# Patient Record
Sex: Female | Born: 1935
Health system: Southern US, Community
[De-identification: ages and names within clinical notes are randomized; demographics above are authoritative.]

## PROBLEM LIST (undated history)

## (undated) DIAGNOSIS — D509 Iron deficiency anemia, unspecified: Secondary | ICD-10-CM

## (undated) DIAGNOSIS — Z8701 Personal history of pneumonia (recurrent): Secondary | ICD-10-CM

## (undated) DIAGNOSIS — C189 Malignant neoplasm of colon, unspecified: Secondary | ICD-10-CM

## (undated) DIAGNOSIS — H548 Legal blindness, as defined in USA: Secondary | ICD-10-CM

## (undated) DIAGNOSIS — H3530 Unspecified macular degeneration: Secondary | ICD-10-CM

## (undated) DIAGNOSIS — I639 Cerebral infarction, unspecified: Secondary | ICD-10-CM

## (undated) DIAGNOSIS — I2510 Atherosclerotic heart disease of native coronary artery without angina pectoris: Secondary | ICD-10-CM

## (undated) DIAGNOSIS — I10 Essential (primary) hypertension: Secondary | ICD-10-CM

## (undated) DIAGNOSIS — E119 Type 2 diabetes mellitus without complications: Secondary | ICD-10-CM

## (undated) DIAGNOSIS — R4189 Other symptoms and signs involving cognitive functions and awareness: Secondary | ICD-10-CM

## (undated) DIAGNOSIS — E782 Mixed hyperlipidemia: Secondary | ICD-10-CM

## (undated) DIAGNOSIS — D649 Anemia, unspecified: Secondary | ICD-10-CM

## (undated) DIAGNOSIS — I480 Paroxysmal atrial fibrillation: Secondary | ICD-10-CM

## (undated) HISTORY — DX: Type 2 diabetes mellitus without complications: E11.9

## (undated) HISTORY — PX: TOTAL HIP ARTHROPLASTY: SHX124

## (undated) HISTORY — DX: Other symptoms and signs involving cognitive functions and awareness: R41.89

## (undated) HISTORY — DX: Paroxysmal atrial fibrillation: I48.0

## (undated) HISTORY — DX: Unspecified macular degeneration: H35.30

## (undated) HISTORY — PX: CATARACT EXTRACTION: SUR2

## (undated) HISTORY — DX: Mixed hyperlipidemia: E78.2

## (undated) HISTORY — PX: CHOLECYSTECTOMY: SHX55

## (undated) HISTORY — PX: APPENDECTOMY: SHX54

## (undated) HISTORY — DX: Personal history of pneumonia (recurrent): Z87.01

## (undated) HISTORY — PX: OTHER SURGICAL HISTORY: SHX169

## (undated) HISTORY — DX: Anemia, unspecified: D64.9

## (undated) HISTORY — DX: Malignant neoplasm of colon, unspecified: C18.9

## (undated) HISTORY — DX: Essential (primary) hypertension: I10

## (undated) HISTORY — DX: Iron deficiency anemia, unspecified: D50.9

## (undated) HISTORY — PX: TUBAL LIGATION: SHX77

---

## 2005-01-02 ENCOUNTER — Ambulatory Visit: Payer: Self-pay | Admitting: Internal Medicine

## 2005-01-02 ENCOUNTER — Ambulatory Visit (HOSPITAL_COMMUNITY): Admission: RE | Admit: 2005-01-02 | Discharge: 2005-01-02 | Payer: Self-pay | Admitting: Internal Medicine

## 2005-06-12 ENCOUNTER — Ambulatory Visit: Payer: Self-pay | Admitting: Oncology

## 2005-06-12 ENCOUNTER — Ambulatory Visit (HOSPITAL_COMMUNITY): Admission: RE | Admit: 2005-06-12 | Discharge: 2005-06-12 | Payer: Self-pay | Admitting: Oncology

## 2010-12-10 ENCOUNTER — Inpatient Hospital Stay (HOSPITAL_COMMUNITY): Payer: Medicare Other

## 2010-12-10 ENCOUNTER — Observation Stay (HOSPITAL_COMMUNITY)
Admission: RE | Admit: 2010-12-10 | Discharge: 2010-12-11 | Disposition: A | Discharge status: Home / Self Care | Payer: Medicare Other | Source: Ambulatory Visit | Attending: Orthopedic Surgery | Admitting: Orthopedic Surgery

## 2010-12-10 DIAGNOSIS — I10 Essential (primary) hypertension: Secondary | ICD-10-CM | POA: Insufficient documentation

## 2010-12-10 DIAGNOSIS — S82009A Unspecified fracture of unspecified patella, initial encounter for closed fracture: Principal | ICD-10-CM | POA: Insufficient documentation

## 2010-12-10 DIAGNOSIS — W19XXXA Unspecified fall, initial encounter: Secondary | ICD-10-CM | POA: Insufficient documentation

## 2010-12-10 DIAGNOSIS — E119 Type 2 diabetes mellitus without complications: Secondary | ICD-10-CM | POA: Insufficient documentation

## 2010-12-10 DIAGNOSIS — Y92009 Unspecified place in unspecified non-institutional (private) residence as the place of occurrence of the external cause: Secondary | ICD-10-CM | POA: Insufficient documentation

## 2010-12-10 LAB — BASIC METABOLIC PANEL
BUN: 24 mg/dL — ABNORMAL HIGH (ref 6–23)
Calcium: 9.5 mg/dL (ref 8.4–10.5)
GFR calc non Af Amer: 48 mL/min — ABNORMAL LOW (ref >60)
Glucose, Bld: 104 mg/dL — ABNORMAL HIGH (ref 70–99)
Potassium: 4.7 mEq/L (ref 3.5–5.1)

## 2010-12-10 LAB — GLUCOSE, CAPILLARY: Glucose-Capillary: 145 mg/dL — ABNORMAL HIGH (ref 70–99)

## 2010-12-10 LAB — CBC
MCHC: 32.9 g/dL (ref 30.0–36.0)
MCV: 86 fL (ref 78.0–100.0)
Platelets: 257 10*3/uL (ref 150–400)
RDW: 13.6 % (ref 11.5–15.5)
WBC: 7.2 10*3/uL (ref 4.0–10.5)

## 2010-12-10 LAB — SURGICAL PCR SCREEN: Staphylococcus aureus: NEGATIVE

## 2010-12-11 LAB — GLUCOSE, CAPILLARY: Glucose-Capillary: 105 mg/dL — ABNORMAL HIGH (ref 70–99)

## 2010-12-12 LAB — GLUCOSE, CAPILLARY: Glucose-Capillary: 92 mg/dL (ref 70–99)

## 2011-01-03 NOTE — Op Note (Signed)
Laura Parks, Laura Parks                ACCOUNT NO.:  [id number]  MEDICAL RECORD NO.:  [id number]           PATIENT TYPE:  I  LOCATION:  [id number]                         FACILITY:  [facility]  PHYSICIAN:  Wylene Simmer, MD        DATE OF BIRTH:  May 10, 1930  DATE OF PROCEDURE:  12/10/2010 DATE OF DISCHARGE:                              OPERATIVE REPORT   PREOPERATIVE DIAGNOSIS:  Right patella fracture.  POSTOPERATIVE DIAGNOSIS:  Right patella fracture.  PROCEDURES: 1. Open reduction internal fixation of right patella fracture. 2. Intraoperative interpretation of fluoroscopic images.  SURGEON:  Wylene Simmer, MD  ANESTHESIA:  General, regional.  IV FLUIDS:  See anesthesia record.  ESTIMATED BLOOD LOSS:  Minimal.  TOURNIQUET TIME:  Fifty-nine minutes at 250 mmHg.  COMPLICATIONS:  None apparent.  DISPOSITION:  Extubated, awake, and stable to recovery.  INDICATIONS FOR PROCEDURE:  The patient is a 77 year old female who has a history of a right patella fracture status post ORIF several years ago.  She recently fell on her right patella.  She was found on x-rays and a CT scan to have a displaced widely separated fracture.  She presents now for operative treatment of this intra-articular injury. She understands the risks and benefits of this procedure as well as the alternative treatment options and would like to proceed.  Specifically, she understands risks of bleeding, infection, nerve damage, blood clots, need for additional surgery, amputation, and death.  PROCEDURE IN DETAIL:  After preoperative consent was obtained and the correct operative site was identified, the patient was brought to the operating room and placed supine on the operating table.  General anesthesia was induced.  Regional anesthesia previously had been administered.  A surgical time-out was taken.  Preoperative antibiotics were administered.  The right lower extremity was prepped and draped in standard sterile  fashion with tourniquet around proximal thigh.  The patient's previous anterior knee incision was identified.  The extremity was exsanguinated and the tourniquet was inflated to 250 mmHg. Previously marked incision was made and sharp dissection was carried down through the level of the skin to the level of the extensor retinaculum.  The extensor retinaculum was incised in line with its fibers elevated off of the patellar tendon.  The transverse fracture line was identified and was noted to be displaced with some midline comminution.  The fracture line was opened and irrigated.  A large hematoma was aspirated from the knee joint.  The fracture was reduced. A 1.6-mm guidewire from the DePuy 4-mm cannulated screw set was then inserted through the inferior hole and advanced into the proximal aspect of the patella.  A second wire was placed in parallel capturing the inferolateral fragment.  The knee was examined under AP and lateral fluoroscopic imaging.  The articular surface was noted to be appropriately reduced.  The guidepin was measured, a 4-mm partially threaded cannulated screw was then inserted over the guidewire and advanced until compression was noted at the fracture line.  This was repeated for the lateral wire.  AP and lateral views were again obtained showing appropriate position and length of the hardware  as well as appropriate reduction of the fracture lines.  A #5 FiberWire was then advanced through the cannulated hole of one of the screws.  It was brought back over the anterior aspect of the patella and then advanced through the other screw.  The two remaining limbs were then tied over the anterior aspect of the patella creating a figure-of-eight pattern tension band.  The suture ends were trimmed and the knot was buried beneath the other limb of suture.  Final AP and lateral views showed appropriate position and length of all hardware and appropriate reduction of the  fracture.  The patient's knee could be flexed to 105 degrees without gapping of the fracture site.  The wound was irrigated copiously.  The extensor retinaculum was closed with simple sutures of 3- 0 Monocryl.  The skin was closed with a running subcuticular 3-0 Monocryl suture.  Sterile dressings were applied followed by a range of motion brace locked in 0 degrees.  The patient was then awakened from anesthesia and transported to recovery room in stable condition.  The tourniquet had been released 59 minutes after application of the dressings and compression wrap.  The patient was then awakened from anesthesia and transported to recovery room in stable condition.  FOLLOWUP PLAN:  The patient will be weightbearing as tolerated on her right lower extremity in her brace.  She will follow up with me in 2 weeks.  She will be observed overnight for pain control.     Wylene Simmer, MD     JH/MEDQ  D:  12/10/2010  T:  12/11/2010  Job:  [id number]  Electronically Signed by Wylene Simmer  on 01/03/2011 10:27:47 PM

## 2012-02-23 ENCOUNTER — Telehealth (INDEPENDENT_AMBULATORY_CARE_PROVIDER_SITE_OTHER): Payer: Self-pay | Admitting: *Deleted

## 2012-02-23 ENCOUNTER — Ambulatory Visit (INDEPENDENT_AMBULATORY_CARE_PROVIDER_SITE_OTHER): Payer: Medicare Other | Admitting: Internal Medicine

## 2012-02-23 ENCOUNTER — Encounter (INDEPENDENT_AMBULATORY_CARE_PROVIDER_SITE_OTHER): Payer: Self-pay | Admitting: Internal Medicine

## 2012-02-23 ENCOUNTER — Other Ambulatory Visit (INDEPENDENT_AMBULATORY_CARE_PROVIDER_SITE_OTHER): Payer: Self-pay | Admitting: *Deleted

## 2012-02-23 VITALS — BP 100/62 | HR 60 | Temp 98.4°F | Ht 62.0 in | Wt 154.5 lb

## 2012-02-23 DIAGNOSIS — C189 Malignant neoplasm of colon, unspecified: Secondary | ICD-10-CM

## 2012-02-23 DIAGNOSIS — E119 Type 2 diabetes mellitus without complications: Secondary | ICD-10-CM

## 2012-02-23 DIAGNOSIS — I10 Essential (primary) hypertension: Secondary | ICD-10-CM

## 2012-02-23 DIAGNOSIS — R197 Diarrhea, unspecified: Secondary | ICD-10-CM

## 2012-02-23 DIAGNOSIS — E7800 Pure hypercholesterolemia, unspecified: Secondary | ICD-10-CM

## 2012-02-23 DIAGNOSIS — E782 Mixed hyperlipidemia: Secondary | ICD-10-CM | POA: Insufficient documentation

## 2012-02-23 DIAGNOSIS — E139 Other specified diabetes mellitus without complications: Secondary | ICD-10-CM | POA: Insufficient documentation

## 2012-02-23 DIAGNOSIS — D649 Anemia, unspecified: Secondary | ICD-10-CM

## 2012-02-23 NOTE — Progress Notes (Signed)
Subjective:     Patient ID: Laura Parks, female   DOB: [birth year], 78 y.o.   MRN: [id number]  HPI Laura Parks is a 78 yr old female with c/o of diarrhea. She has had chronic diarrhea all her life.  Symptoms worse in the last 6 months. Sometimes she is incontinent.  Sometimes she feels a quiver rt side of abdomen and then a short while later she will have diarrhea. She is having over 10 stools a day when she is having diarrhea. The diarrhea is on a daily basis. She has been taking Lomotil given to her by Dr. Woody Seller which helps. With the Lomotil she would have 5 stools a day.  Appetite is good.. Intentional weight loss.  She will have lower abdominal pain and nausea with her BMs. No melena or bright red rectal bleeding. Hx of microcystic anemia and underwent a colonoscopy in 2009 for same (plus diarrhea): see below.   Two weeks ago she was on antibiotics for a tick bite.   Colonoscopy 11/09 Dr. Laural Golden: Normal . Exam performed to ileocolonic anastomosis in region of hepatic flexure which was normal. No evidence of polyps and or tumor. Few scattered diverticula at sigmoid. Hx hemicolectomy. Review of Systems see hpi      Current Outpatient Prescriptions  Medication Sig Dispense Refill  . alendronate (FOSAMAX) 70 MG tablet Take 70 mg by mouth every 7 (seven) days. Take with a full glass of water on an empty stomach.      Marland Kitchen atorvastatin (LIPITOR) 10 MG tablet Take 10 mg by mouth daily.      . calcium carbonate (OS-CAL) 600 MG TABS Take 600 mg by mouth daily.      . diphenoxylate-atropine (LOMOTIL) 2.5-0.025 MG per tablet Take 1 tablet by mouth 4 (four) times daily as needed.      . ergocalciferol (VITAMIN D2) 50000 UNITS capsule Take 50,000 Units by mouth once a week.      . ferrous gluconate (FERGON) 325 MG tablet Take 325 mg by mouth daily with breakfast.      . lisinopril (PRINIVIL,ZESTRIL) 40 MG tablet Take 40 mg by mouth daily.      . metFORMIN (GLUCOPHAGE) 1000 MG tablet Take 1,000 mg by mouth 2  (two) times daily with a meal.      . metoprolol (LOPRESSOR) 50 MG tablet Take 50 mg by mouth 2 (two) times daily.      . Multiple Vitamin (MULTIVITAMIN) tablet Take 1 tablet by mouth daily.       Past Medical History  Diagnosis Date  . Diabetes mellitus     type 2 x 20 yrs  . Colon cancer   . Anemia   . Hypertension   . High cholesterol    Past Surgical History  Procedure Date  . Cataract extraction   . Cholecystectomy   . Appendectomy   . Rt knee surgery for a fx   . Colon surgery for colon cancer     1992 by Dr Mendel Ryder  . Tubal ligation    Family Status  Relation Status Death Age  . Mother Deceased     copd  . Father Alive     health unknown age 53  . Brother Alive     all in pretty good health   History   Social History  . Marital Status: Married    Spouse Name: N/A    Number of Children: N/A  . Years of Education: N/A   Occupational History  .  Not on file.   Social History Main Topics  . Smoking status: Never Smoker   . Smokeless tobacco: Not on file  . Alcohol Use: No  . Drug Use: No  . Sexually Active: Not on file   Other Topics Concern  . Not on file   Social History Narrative  . No narrative on file   Allergies  Allergen Reactions  . Codeine     Objective:   Physical Exam  Filed Vitals:   02/23/12 1426  Height: 5\' 2"  (1.575 m)  Weight: 154 lb 8 oz (70.081 kg)   Alert and oriented. Skin warm and dry. Oral mucosa is moist.   . Sclera anicteric, conjunctivae is pink. Thyroid not enlarged. No cervical lymphadenopathy. Lungs clear. Heart regular rate and rhythm.  Abdomen is soft. Bowel sounds are positive. No hepatomegaly. No abdominal masses felt. No tenderness. Stool brown and guaiac negative.  No edema to lower extremities.       Assessment:   Diarrhea which has worsended in the past 6 months. Personal hx of colon cancer. Infectious process needs to be ruled out.  Colon cancer is also in the differential.  Anemia: felt to be chronic.     Plan:    Stool studies. CBC, Colonoscopy. Increase fiber (fiber tabs). Imodium BID   Will try to get any labs she has from [facility].

## 2012-02-23 NOTE — Telephone Encounter (Signed)
Patient needed correct C-Diff ordered

## 2012-02-23 NOTE — Patient Instructions (Addendum)
Imodium BID on schedule. Fiber 4 gms daily.  Colonoscopy with Dr Laural Golden. CBC and stool studies.

## 2012-02-23 NOTE — Telephone Encounter (Signed)
Patient needs movi prep 

## 2012-02-24 LAB — CBC WITH DIFFERENTIAL/PLATELET
Basophils Absolute: 0 10*3/uL (ref 0.0–0.1)
Basophils Relative: 0 % (ref 0–1)
Lymphocytes Relative: 44 % (ref 12–46)
MCHC: 31.7 g/dL (ref 30.0–36.0)
Monocytes Absolute: 0.5 10*3/uL (ref 0.1–1.0)
Neutro Abs: 3 10*3/uL (ref 1.7–7.7)
Neutrophils Relative %: 47 % (ref 43–77)
Platelets: 156 10*3/uL (ref 150–400)
RDW: 14.5 % (ref 11.5–15.5)
WBC: 6.4 10*3/uL (ref 4.0–10.5)

## 2012-02-24 MED ORDER — PEG-KCL-NACL-NASULF-NA ASC-C 100 G PO SOLR: 1.0000 | Freq: Once | ORAL | Status: DC

## 2012-02-28 LAB — FECAL LACTOFERRIN, QUANT: Lactoferrin: NEGATIVE

## 2012-03-02 LAB — STOOL CULTURE

## 2012-03-09 ENCOUNTER — Encounter (HOSPITAL_COMMUNITY): Payer: Self-pay | Admitting: Pharmacy Technician

## 2012-03-24 ENCOUNTER — Encounter (HOSPITAL_COMMUNITY)
Admission: RE | Disposition: A | Discharge status: Home / Self Care | Payer: Self-pay | Source: Ambulatory Visit | Attending: Internal Medicine

## 2012-03-24 ENCOUNTER — Ambulatory Visit (HOSPITAL_COMMUNITY)
Admission: RE | Admit: 2012-03-24 | Discharge: 2012-03-24 | Disposition: A | Discharge status: Home / Self Care | Payer: Medicare Other | Source: Ambulatory Visit | Attending: Internal Medicine | Admitting: Internal Medicine

## 2012-03-24 ENCOUNTER — Encounter (HOSPITAL_COMMUNITY): Payer: Self-pay | Admitting: *Deleted

## 2012-03-24 DIAGNOSIS — Z85038 Personal history of other malignant neoplasm of large intestine: Secondary | ICD-10-CM | POA: Insufficient documentation

## 2012-03-24 DIAGNOSIS — I10 Essential (primary) hypertension: Secondary | ICD-10-CM | POA: Insufficient documentation

## 2012-03-24 DIAGNOSIS — R197 Diarrhea, unspecified: Secondary | ICD-10-CM | POA: Insufficient documentation

## 2012-03-24 DIAGNOSIS — K5730 Diverticulosis of large intestine without perforation or abscess without bleeding: Secondary | ICD-10-CM

## 2012-03-24 DIAGNOSIS — K633 Ulcer of intestine: Secondary | ICD-10-CM | POA: Insufficient documentation

## 2012-03-24 DIAGNOSIS — Z01812 Encounter for preprocedural laboratory examination: Secondary | ICD-10-CM | POA: Insufficient documentation

## 2012-03-24 DIAGNOSIS — C189 Malignant neoplasm of colon, unspecified: Secondary | ICD-10-CM

## 2012-03-24 DIAGNOSIS — E119 Type 2 diabetes mellitus without complications: Secondary | ICD-10-CM | POA: Insufficient documentation

## 2012-03-24 DIAGNOSIS — E7800 Pure hypercholesterolemia, unspecified: Secondary | ICD-10-CM | POA: Insufficient documentation

## 2012-03-24 HISTORY — PX: COLONOSCOPY: SHX5424

## 2012-03-24 LAB — GLUCOSE, CAPILLARY: Glucose-Capillary: 115 mg/dL — ABNORMAL HIGH (ref 70–99)

## 2012-03-24 SURGERY — COLONOSCOPY
Anesthesia: Moderate Sedation

## 2012-03-24 MED ORDER — MIDAZOLAM HCL 5 MG/5ML IJ SOLN
INTRAMUSCULAR | Status: AC
  Filled 2012-03-24: qty 10

## 2012-03-24 MED ORDER — MIDAZOLAM HCL 5 MG/5ML IJ SOLN
INTRAMUSCULAR | Status: DC | PRN
  Administered 2012-03-24 (×2): 1 mg via INTRAVENOUS
  Administered 2012-03-24: 2 mg via INTRAVENOUS

## 2012-03-24 MED ORDER — STERILE WATER FOR IRRIGATION IR SOLN
Status: DC | PRN
  Administered 2012-03-24: 12:00:00

## 2012-03-24 MED ORDER — MEPERIDINE HCL 50 MG/ML IJ SOLN
INTRAMUSCULAR | Status: AC
  Filled 2012-03-24: qty 1

## 2012-03-24 MED ORDER — SODIUM CHLORIDE 0.45 % IV SOLN
Freq: Once | INTRAVENOUS | Status: AC
  Administered 2012-03-24: 11:00:00 via INTRAVENOUS

## 2012-03-24 MED ORDER — DEXTROSE IN LACTATED RINGERS 5 % IV SOLN
INTRAVENOUS | Status: DC
  Administered 2012-03-24: 11:00:00 via INTRAVENOUS

## 2012-03-24 MED ORDER — MEPERIDINE HCL 50 MG/ML IJ SOLN
INTRAMUSCULAR | Status: DC | PRN
  Administered 2012-03-24 (×2): 25 mg via INTRAVENOUS

## 2012-03-24 NOTE — OR Nursing (Signed)
Patient took Metformin and Actos this am at 1000. Blood sugar 76. Dr. Laural Golden notified and orders received.

## 2012-03-24 NOTE — Op Note (Signed)
COLONOSCOPY PROCEDURE REPORT  PATIENT:  Laura Parks  MR#:  [id number] Birthdate:  March 20, 1930, 78 y.o., female Endoscopist:  Dr. Rogene Houston, MD Referred By:  Dr. Glenda Chroman, MD Procedure Date: 03/24/2012  Procedure:   Colonoscopy  Indications:  Patient is 78 year old Caucasian female with history of colonic carcinoma with right, colectomy and 1992 was last colonoscopy was in November 2009 now presents for 6 month history of diarrhea. She is undergoing diagnostic/surveillance colonoscopy.  Informed Consent:  The procedure and risks were reviewed with the patient and informed consent was obtained.  Medications:  Demerol 50 mg IV Versed 4 mg IV  Description of procedure:  After a digital rectal exam was performed, that colonoscope was advanced from the anus through the rectum and colon to the area of the cecum, ileocecal valve and appendiceal orifice. The cecum was deeply intubated. These structures were well-seen and photographed for the record. From the level of the cecum and ileocecal valve, the scope was slowly and cautiously withdrawn. The mucosal surfaces were carefully surveyed utilizing scope tip to flexion to facilitate fold flattening as needed. The scope was pulled down into the rectum where a thorough exam including retroflexion was performed.  Findings:   Prep excellent. Patent ileocolonic anastomosis located in the vicinity of hepatic flexure. Short segment of neoterminal ileum was examined and was normal. 5 x 10 mm ulcer at ileocolonic anastomosis. Biopsy taken for routine histology. Scattered diverticula throughout the colon. Normal mucosa throughout. Random biopsies taken from sigmoid colon. Normal rectal mucosa. Skin tags.  Therapeutic/Diagnostic Maneuvers Performed:  See above  Complications:  None  Colon  Withdrawal Time:  10 minutes  Impression:  Normal distal small bowel. Single benign appearing ulcer at ileocolonic anastomosis. It was biopsied for  histology. Scattered diverticula throughout the colon. No endoscopic evidence of colitis. Biopsy taken to rule out microscopic and or collagenous colitis.  Recommendations:  Standard instructions given. Patient advised to take Imodium OTC 2 mg by mouth before breakfast and before lunch. I will contact patient with results of biopsy and further recommendations.  REHMAN,NAJEEB U  03/24/2012 12:12 PM  CC: Dr. Glenda Chroman., MD & Dr. Rayne Du ref. provider found

## 2012-03-24 NOTE — H&P (Signed)
Laura Parks is an 78 y.o. female.   Chief Complaint: Patient is here for colonoscopy. HPI: Patient is 78 year old Caucasian female who has had diarrhea off and on for years and was doing well until 6 months ago she had multiple bowel movements since then. She has some better with Imodium OTC. He also has history of colon carcinoma with right hemicolectomy 1992. She is here for diagnostic and surveillance colonoscopy. Patient's last exam was in 2009. She denies anorexia weight loss or rectal bleeding. Family history is negative for colorectal carcinoma.  Past Medical History  Diagnosis Date  . Diabetes mellitus     type 2 x 20 yrs  . Colon cancer   . Anemia   . Hypertension   . High cholesterol   . Macular degeneration     Past Surgical History  Procedure Date  . Cataract extraction   . Cholecystectomy   . Appendectomy   . Rt knee surgery for a fx   . Colon surgery for colon cancer     1992 by Dr Laura Parks  . Tubal ligation     History reviewed. No pertinent family history. Social History:  reports that she has never smoked. She does not have Laura smokeless tobacco history on file. She reports that she does not drink alcohol or use illicit drugs.  Allergies:  Allergies  Allergen Reactions  . Codeine Nausea Only    Medications Prior to Admission  Medication Sig Dispense Refill  . atorvastatin (LIPITOR) 10 MG tablet Take 10 mg by mouth daily.      . calcium carbonate (OS-CAL) 600 MG TABS Take 600 mg by mouth daily.      . ferrous sulfate 325 (65 FE) MG tablet Take 325 mg by mouth 2 (two) times daily.      Marland Kitchen lisinopril (PRINIVIL,ZESTRIL) 40 MG tablet Take 40 mg by mouth daily.      Marland Kitchen loperamide (IMODIUM) 2 MG capsule Take 2 mg by mouth 2 (two) times daily as needed. For diarrhea      . metFORMIN (GLUCOPHAGE) 500 MG tablet Take 1,000 mg by mouth daily.      . metoprolol succinate (TOPROL-XL) 50 MG 24 hr tablet Take 50 mg by mouth daily. Take with or immediately following a meal.       . Multiple Vitamin (MULTIVITAMIN WITH MINERALS) TABS Take 1 tablet by mouth daily.      . peg 3350 powder (MOVIPREP) 100 G SOLR Take 1 kit (100 g total) by mouth once.  1 kit  0  . pioglitazone (ACTOS) 30 MG tablet Take 30 mg by mouth daily.      . polycarbophil (FIBERCON) 625 MG tablet Take 1,250 mg by mouth 2 (two) times daily.      Marland Kitchen alendronate (FOSAMAX) 70 MG tablet Take 70 mg by mouth every 7 (seven) days. Take with a full glass of water on an empty stomach.      . ergocalciferol (VITAMIN D2) 50000 UNITS capsule Take 50,000 Units by mouth once a week. On Sundays        Results for orders placed during the hospital encounter of 03/24/12 (from the past 48 hour(s))  GLUCOSE, CAPILLARY     Status: Normal   Collection Time   03/24/12 11:21 AM      Component Value Range Comment   Glucose-Capillary 76  70 - 99 mg/dL    Comment 1 Documented in Chart      No results found.  ROS  Blood  pressure 184/80, temperature 98.1 F (36.7 C), temperature source Oral, resp. rate 23, height 5\' 2"  (1.575 m), weight 158 lb (71.668 kg), SpO2 98.00%. Physical Exam  Constitutional: She appears well-developed and well-nourished.  HENT:  Mouth/Throat: Oropharynx is clear and moist.  Eyes: Conjunctivae are normal. No scleral icterus.  Neck: No thyromegaly present.  Cardiovascular: Normal rate, regular rhythm and normal heart sounds.   No murmur heard. Respiratory: Effort normal and breath sounds normal.  GI: Soft. She exhibits no distension and no mass. There is no tenderness.  Musculoskeletal: She exhibits no edema.  Lymphadenopathy:    She has no cervical adenopathy.  Neurological: She is alert.  Skin: Skin is warm and dry.     Assessment/Plan Diarrhea. History of colon carcinoma. Diagnostic/surveillance colonoscopy  Laura Parks U 03/24/2012, 11:36 AM

## 2012-03-29 ENCOUNTER — Encounter (HOSPITAL_COMMUNITY): Payer: Self-pay | Admitting: Internal Medicine

## 2012-03-30 ENCOUNTER — Encounter (INDEPENDENT_AMBULATORY_CARE_PROVIDER_SITE_OTHER): Payer: Self-pay | Admitting: *Deleted

## 2012-04-22 ENCOUNTER — Ambulatory Visit (INDEPENDENT_AMBULATORY_CARE_PROVIDER_SITE_OTHER): Payer: Medicare Other | Admitting: Otolaryngology

## 2012-04-22 DIAGNOSIS — R51 Headache: Secondary | ICD-10-CM

## 2012-04-22 DIAGNOSIS — J310 Chronic rhinitis: Secondary | ICD-10-CM

## 2012-05-21 ENCOUNTER — Encounter (INDEPENDENT_AMBULATORY_CARE_PROVIDER_SITE_OTHER): Payer: Self-pay

## 2012-07-05 ENCOUNTER — Encounter (INDEPENDENT_AMBULATORY_CARE_PROVIDER_SITE_OTHER): Payer: Self-pay

## 2012-07-19 ENCOUNTER — Encounter (INDEPENDENT_AMBULATORY_CARE_PROVIDER_SITE_OTHER): Payer: Self-pay | Admitting: Internal Medicine

## 2012-07-19 ENCOUNTER — Encounter (INDEPENDENT_AMBULATORY_CARE_PROVIDER_SITE_OTHER): Payer: Self-pay | Admitting: *Deleted

## 2012-07-19 ENCOUNTER — Other Ambulatory Visit (INDEPENDENT_AMBULATORY_CARE_PROVIDER_SITE_OTHER): Payer: Self-pay | Admitting: *Deleted

## 2012-07-19 ENCOUNTER — Ambulatory Visit (INDEPENDENT_AMBULATORY_CARE_PROVIDER_SITE_OTHER): Payer: Medicare Other | Admitting: Internal Medicine

## 2012-07-19 VITALS — BP 140/70 | HR 78 | Temp 97.5°F | Resp 20 | Ht 62.0 in | Wt 155.6 lb

## 2012-07-19 DIAGNOSIS — D509 Iron deficiency anemia, unspecified: Secondary | ICD-10-CM

## 2012-07-19 NOTE — Consult Note (Signed)
Laura Parks, Laura Parks                ACCOUNT NO.:  [id number]  MEDICAL RECORD NO.:  [id number]  LOCATION:  [facility]                          FACILITY:  [facility]  PHYSICIAN:  Hildred Laser, M.D.    DATE OF BIRTH:  02-Feb-1930  DATE OF CONSULTATION:  07/19/2012 DATE OF DISCHARGE:  03/24/2012                                CONSULTATION   REASON FOR CONSULTATION:  Iron-deficiency anemia.  HISTORY OF PRESENT ILLNESS:  The patient is an 78 year old Caucasian female, who is referred through courtesy of Dr. Woody Seller for evaluation of iron deficiency anemia.  The patient was admitted to [facility] about 5 weeks ago with pneumonia and during that hospitalization, she was noted to be anemic.  Her hemoglobin was as low as 8.5 g.  Her platelet count was also low between 91 and 105.  She had iron studies.  Serum iron was 19, TIBC 263, saturation was 7%.  She was, therefore, advised to undergo GI evaluation.  The patient denies melena or rectal bleeding.  She also denies vaginal bleeding or hematuria.  She rarely experiences heartburn.  She has occasional lower abdominal pain, described as cramping.  She states she has a good appetite, but she has lost 20 pounds this year voluntarily.  She states she has cut back on sweets and breads.  The patient has history of colon carcinoma and underwent surveillance colonoscopy on March 24, 2012, revealing patent anastomosis with single ulcer and biopsy revealed benign etiology.  She also has scattered diverticula throughout the colon.  Biopsy from her sigmoid colon was negative for microscopic or collagenous colitis.  The patient states since she has been on Imodium, she is having normal bowel movements.  She generally has 1 stool per day.  CURRENT MEDICATIONS: 1. Alendronate 70 mg p.o. every weekly. 2. Atorvastatin 10 mg p.o. daily. 3. Calcium carbonate 600 mg p.o. daily. 4. Vitamin D2 50,000 units p.o. every weekly. 5. Ferrous sulfate 325 mg p.o.  daily. 6. Lactobacillus 1 capsule p.o. b.i.d. 7. Lisinopril 40 mg p.o. daily. 8. Loperamide 2 mg p.o. b.i.d. p.r.n. 9. Metformin 1 g p.o. b.i.d. 10.Metoprolol succinate 50 mg p.o. daily. 11.MVI with minerals 1 tablet daily. 12.Pioglitazone 30 mg p.o. q.a.m. 13.FiberCon 2 tablets p.o. b.i.d.  PAST MEDICAL HISTORY:  She has been diabetic for over 20 years.  She has had hypertension for about 10 years.  Hyperlipidemia also about 10 years.  She has osteoporosis.  History of colon carcinoma.  She had right hemicolectomy in 1992, and she has undergone serial surveillance colonoscopies most recently in July 2013, as above.  She has macular degeneration diagnosed last year. She is legally blind.  She is able to read large print and able to watch TV with special goggles.  She had bilateral cataract surgery over 10 years ago and she had cholecystectomy in 2012.  She has had surgery for right patellar fracture 7 years ago, and she fell again and refractured and had surgery in March 2012.  ALLERGIES:  CODEINE.  FAMILY HISTORY:  Mother lived to be 22, she had COPD.  The patient has no information about her father.  She has 4 brothers in good health.  SOCIAL HISTORY:  She is married.  Her husband is 3 years old, is recovering from colonic surgery for large polyp that could not be removed endoscopically.  He apparently presented to the hospital with low hemoglobin.  She has 2 children living.  Her son is in good health. Daughter has multiple health problems.  One son was murdered at the age 50.  She worked at [employer] for over 26 years.  She has never smoked cigarettes or drank alcohol.  PHYSICAL EXAMINATION:  VITAL SIGNS:  Weight 155.6 pounds, she is 62 inches tall, pulse 78 per minute, blood pressure 140/70, respiratory rate is 20 and temp is 97.5. HEENT:  Conjunctivae are somewhat pale.  Sclerae are nonicteric. Oropharyngeal mucosa is normal.  NECK:  No neck masses or  thyromegaly noted. CARDIAC:  With regular rhythm.  Normal S1 and S2.  No murmur or gallop noted. LUNGS:  Clear to auscultation. ABDOMEN:  Symmetrical bowel sounds are normal.  On palpation, soft abdomen with mild midepigastric tenderness.  No organomegaly or masses noted. RECTAL:  Brown guaiac negative stool. EXTREMITIES:  No peripheral edema, clubbing, or koilonychia noted.  LABORATORY DATA:  Iron studies as above.  Her serum ferritin was 300.1, and B12 level was 459.  CBC from June 14, 2012, revealed WBC of 6.2, H and H of 8.5 and 25.5 and MCV was 88.3 and platelet count was 105 potassium.  BUN was 15, creatinine 0.90.  Bilirubin 0.6, AP 45, AST 23, ALT 21, total protein 5.4 with albumin of 2.9, calcium is 7.8.  Hemoglobin A1c from July 16, 2012, was 6.6.  ASSESSMENT:  The patient is an 78 year old Caucasian female, who has history of colonic carcinoma with right hemicolectomy 21 years ago, who underwent surveillance colonoscopy in July this year revealing benign anastomotic ulcer, who was recently hospitalized for pneumonia and found to be anemic.  Serum iron and saturation are low, but ferritin was normal.  Elevated ferritin maybe acute phase reactant in the setting of pneumonia.  Her stool is guaiac negative.  It is possible that she has anemia of chronic disease.  She could also be losing blood intermittently from anastomotic ulcer, but need to rule out peptic ulcer disease, as well as celiac disease given that she has history of diarrhea although she is having normal stools with Imodium and fiber supplement.  RECOMMENDATIONS: 1. We will schedule the patient for diagnostic     esophagogastroduodenoscopy and duodenal biopsy. 2. She will continue ferrous sulfate at current dose. 3. She will have H and H at the time of EGD.  We appreciate the opportunity to participate in the care of this nice lady.          ______________________________ Hildred Laser,  M.D.     NR/MEDQ  D:  07/19/2012  T:  07/19/2012  Job:  [id number]

## 2012-07-19 NOTE — Progress Notes (Signed)
DATE OF CONSULTATION: 07/19/2012    REASON FOR CONSULTATION: Iron-deficiency anemia.  HISTORY OF PRESENT ILLNESS: The patient is an 78 year old Caucasian  female, who is referred through courtesy of Dr. Woody Seller for evaluation of  iron deficiency anemia. The patient was admitted to [facility] about 5 weeks ago with pneumonia and during that  hospitalization, she was noted to be anemic. Her hemoglobin was as low  as 8.5 g. Her platelet count was also low between 91 and 105. She had  iron studies. Serum iron was 19, TIBC 263, saturation was 7%. She was,  therefore, advised to undergo GI evaluation. The patient denies melena  or rectal bleeding. She also denies vaginal bleeding or hematuria. She  rarely experiences heartburn. She has occasional lower abdominal pain,  described as cramping. She states she has a good appetite, but she has  lost 20 pounds this year voluntarily. She states she has cut back on  sweets and breads.  The patient has history of colon carcinoma and underwent surveillance  colonoscopy on March 24, 2012, revealing patent anastomosis with single  ulcer and biopsy revealed benign etiology. She also has scattered  diverticula throughout the colon. Biopsy from her sigmoid colon was  negative for microscopic or collagenous colitis.  The patient states since she has been on Imodium, she is having normal  bowel movements. She generally has 1 stool per day.  CURRENT MEDICATIONS:  1. Alendronate 70 mg p.o. every weekly.  2. Atorvastatin 10 mg p.o. daily.  3. Calcium carbonate 600 mg p.o. daily.  4. Vitamin D2 50,000 units p.o. every weekly.  5. Ferrous sulfate 325 mg p.o. daily.  6. Lactobacillus 1 capsule p.o. b.i.d.  7. Lisinopril 40 mg p.o. daily.  8. Loperamide 2 mg p.o. b.i.d. p.r.n.  9. Metformin 1 g p.o. b.i.d.  10.Metoprolol succinate 50 mg p.o. daily.  11.MVI with minerals 1 tablet daily.  12.Pioglitazone 30 mg p.o. q.a.m.  13.FiberCon 2 tablets  p.o. b.i.d.  PAST MEDICAL HISTORY: She has been diabetic for over 20 years. She has  had hypertension for about 10 years. Hyperlipidemia also about 10  years. She has osteoporosis.  History of colon carcinoma. She had right hemicolectomy in 1992, and  she has undergone serial surveillance colonoscopies most recently in  July 2013, as above. She has macular degeneration diagnosed last year.  She is legally blind. She is able to read large print and able to watch  TV with special goggles.  She had bilateral cataract surgery over 10 years ago and she had  cholecystectomy in 2012.  She has had surgery for right patellar fracture 7 years ago, and she  fell again and refractured and had surgery in March 2012.  ALLERGIES: CODEINE.  FAMILY HISTORY: Mother lived to be 75, she had COPD. The patient has  no information about her father. She has 4 brothers in good health.  SOCIAL HISTORY: She is married. Her husband is 53 years old, is  recovering from colonic surgery for large polyp that could not be  removed endoscopically. He apparently presented to the hospital with  low hemoglobin. She has 2 children living. Her son is in good health.  Daughter has multiple health problems. One son was murdered at the age  41. She worked at [employer] for over 26 years. She has never  smoked cigarettes or drank alcohol.  PHYSICAL EXAMINATION: VITAL SIGNS: Weight 155.6 pounds, she is 62  inches tall, pulse 78 per minute, blood pressure 140/70,  respiratory  rate is 20 and temp is 97.5.  HEENT: Conjunctivae are somewhat pale. Sclerae are nonicteric.  Oropharyngeal mucosa is normal. NECK: No neck masses or thyromegaly  noted.  CARDIAC: With regular rhythm. Normal S1 and S2. No murmur or gallop  noted.  LUNGS: Clear to auscultation.  ABDOMEN: Symmetrical bowel sounds are normal. On palpation, soft  abdomen with mild midepigastric tenderness. No organomegaly or masses  noted.  RECTAL: Brown guaiac  negative stool.  EXTREMITIES: No peripheral edema, clubbing, or koilonychia noted.  LABORATORY DATA: Iron studies as above. Her serum ferritin was 300.1,  and B12 level was 459.  CBC from June 14, 2012, revealed WBC of 6.2, H and H of 8.5 and  25.5 and MCV was 88.3 and platelet count was 105 potassium. BUN was 15,  creatinine 0.90. Bilirubin 0.6, AP 45, AST 23, ALT 21, total protein  5.4 with albumin of 2.9, calcium is 7.8.  Hemoglobin A1c from July 16, 2012, was 6.6.  ASSESSMENT: The patient is an 78 year old Caucasian female, who has  history of colonic carcinoma with right hemicolectomy 21 years ago, who  underwent surveillance colonoscopy in July this year revealing benign  anastomotic ulcer, who was recently hospitalized for pneumonia and found  to be anemic. Serum iron and saturation are low, but ferritin was  normal. Elevated ferritin maybe acute phase reactant in the setting of  pneumonia. Her stool is guaiac negative. It is possible that she has  anemia of chronic disease. She could also be losing blood  intermittently from anastomotic ulcer, but need to rule out peptic ulcer  disease, as well as celiac disease given that she has history of diarhea although she is having normal stools with Imodium and fiber supplement.  RECOMMENDATIONS:  1. We will schedule the patient for diagnostic  esophagogastroduodenoscopy and duodenal biopsy.  2. She will continue ferrous sulfate at current dose.  3. She will have H and H at the time of EGD.  We appreciate the opportunity to participate in the care of this nice  lady.  ______________________________  Hildred Laser, M.D.  NR/MEDQ D: 07/19/2012 T: 07/19/2012 Job: [id number]

## 2012-07-19 NOTE — Patient Instructions (Addendum)
Esophagogastroduodenoscopy to be scheduled.  

## 2012-07-26 ENCOUNTER — Encounter (HOSPITAL_COMMUNITY): Payer: Self-pay | Admitting: Pharmacy Technician

## 2012-08-03 ENCOUNTER — Encounter (INDEPENDENT_AMBULATORY_CARE_PROVIDER_SITE_OTHER): Payer: Self-pay

## 2012-08-03 MED ORDER — SODIUM CHLORIDE 0.45 % IV SOLN
INTRAVENOUS | Status: DC
  Administered 2012-08-04: 14:00:00 via INTRAVENOUS

## 2012-08-04 ENCOUNTER — Encounter (HOSPITAL_COMMUNITY): Payer: Self-pay | Admitting: *Deleted

## 2012-08-04 ENCOUNTER — Ambulatory Visit (HOSPITAL_COMMUNITY)
Admission: RE | Admit: 2012-08-04 | Discharge: 2012-08-04 | Disposition: A | Discharge status: Home / Self Care | Payer: Medicare Other | Source: Ambulatory Visit | Attending: Internal Medicine | Admitting: Internal Medicine

## 2012-08-04 ENCOUNTER — Encounter (HOSPITAL_COMMUNITY)
Admission: RE | Disposition: A | Discharge status: Home / Self Care | Payer: Self-pay | Source: Ambulatory Visit | Attending: Internal Medicine

## 2012-08-04 DIAGNOSIS — K2940 Chronic atrophic gastritis without bleeding: Secondary | ICD-10-CM | POA: Insufficient documentation

## 2012-08-04 DIAGNOSIS — D509 Iron deficiency anemia, unspecified: Secondary | ICD-10-CM | POA: Insufficient documentation

## 2012-08-04 DIAGNOSIS — I10 Essential (primary) hypertension: Secondary | ICD-10-CM | POA: Insufficient documentation

## 2012-08-04 DIAGNOSIS — R197 Diarrhea, unspecified: Secondary | ICD-10-CM

## 2012-08-04 DIAGNOSIS — Z85038 Personal history of other malignant neoplasm of large intestine: Secondary | ICD-10-CM

## 2012-08-04 DIAGNOSIS — E119 Type 2 diabetes mellitus without complications: Secondary | ICD-10-CM | POA: Insufficient documentation

## 2012-08-04 DIAGNOSIS — A048 Other specified bacterial intestinal infections: Secondary | ICD-10-CM | POA: Insufficient documentation

## 2012-08-04 HISTORY — PX: ESOPHAGOGASTRODUODENOSCOPY: SHX5428

## 2012-08-04 SURGERY — EGD (ESOPHAGOGASTRODUODENOSCOPY)
Anesthesia: Moderate Sedation

## 2012-08-04 MED ORDER — MEPERIDINE HCL 25 MG/ML IJ SOLN
INTRAMUSCULAR | Status: DC | PRN
  Administered 2012-08-04: 25 mg via INTRAVENOUS

## 2012-08-04 MED ORDER — STERILE WATER FOR IRRIGATION IR SOLN
Status: DC | PRN
  Administered 2012-08-04: 16:00:00

## 2012-08-04 MED ORDER — MIDAZOLAM HCL 5 MG/5ML IJ SOLN
INTRAMUSCULAR | Status: DC | PRN
  Administered 2012-08-04: 2 mg via INTRAVENOUS
  Administered 2012-08-04 (×2): 1 mg via INTRAVENOUS

## 2012-08-04 MED ORDER — MEPERIDINE HCL 50 MG/ML IJ SOLN
INTRAMUSCULAR | Status: AC
  Filled 2012-08-04: qty 1

## 2012-08-04 MED ORDER — MIDAZOLAM HCL 5 MG/5ML IJ SOLN
INTRAMUSCULAR | Status: AC
  Filled 2012-08-04: qty 10

## 2012-08-04 MED ORDER — BUTAMBEN-TETRACAINE-BENZOCAINE 2-2-14 % EX AERO
INHALATION_SPRAY | CUTANEOUS | Status: DC | PRN
  Administered 2012-08-04: 2 via TOPICAL

## 2012-08-04 NOTE — Op Note (Signed)
EGD PROCEDURE REPORT  PATIENT:  Laura Parks  MR#:  [id number] Birthdate:  Aug 27, 1930, 78 y.o., female Endoscopist:  Dr. Rogene Houston, MD Referred By:  Dr. Glenda Chroman, MD Procedure Date: 08/04/2012  Procedure:   EGD  Indications:  Patient is 78 year old Caucasian female who was recently found to have iron deficiency anemia she was hospitalized for pneumonia at [facility] in [facility]. She has history of colon carcinoma and remains in remission. She underwent surveillance colonoscopy in July, 2013. She has chronic diarrhea easily controlled with 4 mg of Imodium per day. She is undergoing diagnostic EGD.            Informed Consent:  The risks, benefits, alternatives & imponderables which include, but are not limited to, bleeding, infection, perforation, drug reaction and potential missed lesion have been reviewed.  The potential for biopsy, lesion removal, esophageal dilation, etc. have also been discussed.  Questions have been answered.  All parties agreeable.  Please see history & physical in medical record for more information.  Medications:  Demerol 25 mg IV Versed 4 mg IV Cetacaine spray topically for oropharyngeal anesthesia  Description of procedure:  The endoscope was introduced through the mouth and advanced to the second portion of the duodenum without difficulty or limitations. The mucosal surfaces were surveyed very carefully during advancement of the scope and upon withdrawal.  Findings:  Esophagus:  Mucosa of the esophagus was normal. GE junction was unremarkable. GEJ:  39 cm Stomach:  Stomach was empty and distended very well with insufflation. Folds in the proximal stomach are normal. Examination mucosa at body was normal.  Few prepyloric erosions noted along with mucosal edema and few antral  telangiectasia without stigmata of bleed.. Pyloric channel was patent. Angularis fundus and cardia were examined by retroflexing the scope and were normal. Duodenum:  Normal  bulbar and post bulbar mucosa. Random biopsies were taken from post bulbar duodenal mucosa looking for mucosal disease.  Therapeutic/Diagnostic Maneuvers Performed:  See above  Complications:  None  Impression: Erosive antral gastritis with few antral telangiectasia without stigmata of bleed. No evidence of peptic ulcer disease. Random biopsies taken from post bulbar mucosa to rule out celiac disease.  Recommendations:  Will check H&H and H. pylori serology today. Further recommendations to follow.  REHMAN,NAJEEB U  08/04/2012  4:11 PM  CC: Dr. Glenda Chroman., MD & Dr. Rayne Du ref. provider found

## 2012-08-04 NOTE — H&P (Signed)
Laura Parks is an 78 y.o. female.   Chief Complaint: Patient is here for EGD. HPI: Patient is 78 year old Caucasian female who was recently found to have iron deficiency anemia. She has history of colon carcinoma and underwent surveillance colonoscopy. This year. She has chronic diarrhea which is well controlled with 2 Imodium pills a day. There is no history of melena or rectal bleeding. She is undergoing diagnostic EGD and duodenal biopsy.  Past Medical History  Diagnosis Date  . Diabetes mellitus     type 2 x 20 yrs  . Colon cancer   . Anemia   . Hypertension   . High cholesterol   . Macular degeneration   . Pneumonia, pneumococcal     Past Surgical History  Procedure Date  . Cataract extraction   . Cholecystectomy   . Appendectomy   . Rt knee surgery for a fx   . Colon surgery for colon cancer     1992 by Dr Mendel Ryder  . Tubal ligation   . Colonoscopy 03/24/2012    Procedure: COLONOSCOPY;  Surgeon: Rogene Houston, MD;  Location: [facility];  Service: Endoscopy;  Laterality: N/A;  1200    History reviewed. No pertinent family history. Social History:  reports that she has never smoked. She has never used smokeless tobacco. She reports that she does not drink alcohol or use illicit drugs.  Allergies:  Allergies  Allergen Reactions  . Codeine Nausea Only    Medications Prior to Admission  Medication Sig Dispense Refill  . atorvastatin (LIPITOR) 10 MG tablet Take 10 mg by mouth daily.      . calcium carbonate (OS-CAL) 600 MG TABS Take 600 mg by mouth daily.      . ferrous sulfate 325 (65 FE) MG tablet Take 325 mg by mouth daily.       . Lactobacillus (FLORAJEN ACIDOPHILUS) CAPS Take 2 capsules by mouth daily.       Marland Kitchen lisinopril (PRINIVIL,ZESTRIL) 40 MG tablet Take 40 mg by mouth daily.      Marland Kitchen loperamide (IMODIUM) 2 MG capsule Take 2 mg by mouth 2 (two) times daily as needed. For diarrhea      . metFORMIN (GLUCOPHAGE) 500 MG tablet Take 1,000 mg by mouth 2 (two) times  daily.       . metoprolol succinate (TOPROL-XL) 50 MG 24 hr tablet Take 50 mg by mouth daily. Take with or immediately following a meal.      . Multiple Vitamin (MULTIVITAMIN WITH MINERALS) TABS Take 1 tablet by mouth daily.      . pioglitazone (ACTOS) 30 MG tablet Take 30 mg by mouth daily.      . polycarbophil (FIBERCON) 625 MG tablet Take 1,250 mg by mouth 2 (two) times daily.      Marland Kitchen alendronate (FOSAMAX) 70 MG tablet Take 70 mg by mouth every 7 (seven) days. Take with a full glass of water on an empty stomach.      . ergocalciferol (VITAMIN D2) 50000 UNITS capsule Take 50,000 Units by mouth once a week. On Sundays        Results for orders placed during the hospital encounter of 08/04/12 (from the past 48 hour(s))  GLUCOSE, CAPILLARY     Status: Normal   Collection Time   08/04/12  2:11 PM      Component Value Range Comment   Glucose-Capillary 81  70 - 99 mg/dL    No results found.  ROS  Blood pressure 167/61, pulse  61, temperature 98.3 F (36.8 C), temperature source Oral, resp. rate 27, height 5\' 2"  (1.575 m), weight 155 lb (70.308 kg), SpO2 100.00%. Physical Exam  Constitutional: She appears well-developed and well-nourished.  HENT:  Mouth/Throat: Oropharynx is clear and moist.  Eyes: Conjunctivae normal are normal. No scleral icterus.  Neck: No thyromegaly present.  Cardiovascular: Normal rate, regular rhythm and normal heart sounds.   No murmur heard. Respiratory: Effort normal.  GI: Soft. She exhibits no distension and no mass. There is no tenderness.  Musculoskeletal: She exhibits no edema.  Lymphadenopathy:    She has no cervical adenopathy.  Neurological: She is alert.  Skin: Skin is warm and dry.     Assessment/Plan Iron deficiency anemia. History of colon carcinoma with recent negative surveillance colonoscopy. EGD  Vaughn Beaumier U 08/04/2012, 3:44 PM

## 2012-08-07 ENCOUNTER — Other Ambulatory Visit (INDEPENDENT_AMBULATORY_CARE_PROVIDER_SITE_OTHER): Payer: Self-pay | Admitting: Internal Medicine

## 2012-08-07 MED ORDER — PANTOPRAZOLE SODIUM 40 MG PO TBEC: 40.0000 mg | DELAYED_RELEASE_TABLET | Freq: Two times a day (BID) | ORAL | Status: DC

## 2012-08-07 MED ORDER — BIS SUBCIT-METRONID-TETRACYC 140-125-125 MG PO CAPS: 3.0000 | ORAL_CAPSULE | Freq: Three times a day (TID) | ORAL | Status: DC

## 2012-08-09 ENCOUNTER — Encounter (HOSPITAL_COMMUNITY): Payer: Self-pay | Admitting: Internal Medicine

## 2012-08-10 ENCOUNTER — Encounter (INDEPENDENT_AMBULATORY_CARE_PROVIDER_SITE_OTHER): Payer: Self-pay | Admitting: *Deleted

## 2012-08-10 ENCOUNTER — Telehealth (INDEPENDENT_AMBULATORY_CARE_PROVIDER_SITE_OTHER): Payer: Self-pay | Admitting: *Deleted

## 2012-08-10 DIAGNOSIS — D509 Iron deficiency anemia, unspecified: Secondary | ICD-10-CM

## 2012-08-10 NOTE — Telephone Encounter (Signed)
Per Dr.Rehman the patient will need to have labs in 1 month.

## 2012-08-10 NOTE — Telephone Encounter (Signed)
Lab order printed

## 2012-08-20 ENCOUNTER — Telehealth (INDEPENDENT_AMBULATORY_CARE_PROVIDER_SITE_OTHER): Payer: Self-pay | Admitting: *Deleted

## 2012-08-20 NOTE — Telephone Encounter (Signed)
  Aalyssa called asking to speak with Dr. Laural Golden. She is having bowel movements at least 3 times or more daily. They are loss but wouldn't call them diarrhea. Kerrilee doesn't have control over them. She is messing all over herself and is afraid to go out of the house. Could this be her medication or something else? She has had no fever or muscle/body pain with this. Her return phone number is [phone].

## 2012-08-20 NOTE — Telephone Encounter (Signed)
I have talked with the patient and she states that this started on yesterday and does not happen while she is sleeping. Stools are loose but her concern is fecal incontinence. She is currently being treated for H-Pylori and questions if this may be the cause of her loose stools? Patient is requesting that something be called in to stop fecal incontinence. Dr.Rehman paged.

## 2012-08-20 NOTE — Telephone Encounter (Signed)
Per Dr.Rehman the patient may take Imodium 2 mg by mouth three times a day Patient needs to get a  Stool for C-Diff , if possible get today and we may get results tomorrow and Dr.Rehman will call the patient If the patient develops fever she is to call Dr.Rehman  I called the patient and she was not at home, spoke with her husband and gave him Dr.Rehman's recommendations He ask that I send the request to [facility] for the C-Diff. I have done this and ask that they fax results and if positive to page Dr.Rehman

## 2012-08-24 ENCOUNTER — Telehealth (INDEPENDENT_AMBULATORY_CARE_PROVIDER_SITE_OTHER): Payer: Self-pay | Admitting: *Deleted

## 2012-08-24 NOTE — Telephone Encounter (Signed)
Patient was called and made aware of her C-Diff results. She states that she has not had any diarrhea,however, she had stopped the Pylera. Patient was advised to start the Pylera and if diarrhea occurs take the Imodium as directed and call the office for further recommendations.

## 2012-08-26 ENCOUNTER — Encounter (INDEPENDENT_AMBULATORY_CARE_PROVIDER_SITE_OTHER): Payer: Self-pay

## 2012-09-09 LAB — HEMOGLOBIN AND HEMATOCRIT, BLOOD
HCT: 31.6 % — ABNORMAL LOW (ref 36.0–46.0)
Hemoglobin: 10.4 g/dL — ABNORMAL LOW (ref 12.0–15.0)

## 2012-09-13 ENCOUNTER — Telehealth (INDEPENDENT_AMBULATORY_CARE_PROVIDER_SITE_OTHER): Payer: Self-pay | Admitting: *Deleted

## 2012-09-13 DIAGNOSIS — D509 Iron deficiency anemia, unspecified: Secondary | ICD-10-CM

## 2012-09-13 NOTE — Telephone Encounter (Signed)
Per Dr.Rehman the patient is to have H/H in 8 weeks

## 2012-10-14 ENCOUNTER — Telehealth (INDEPENDENT_AMBULATORY_CARE_PROVIDER_SITE_OTHER): Payer: Self-pay | Admitting: *Deleted

## 2012-10-14 ENCOUNTER — Encounter (INDEPENDENT_AMBULATORY_CARE_PROVIDER_SITE_OTHER): Payer: Self-pay | Admitting: *Deleted

## 2012-10-14 DIAGNOSIS — D509 Iron deficiency anemia, unspecified: Secondary | ICD-10-CM

## 2012-10-14 NOTE — Telephone Encounter (Signed)
Lab order printed

## 2012-11-10 ENCOUNTER — Encounter (INDEPENDENT_AMBULATORY_CARE_PROVIDER_SITE_OTHER): Payer: Self-pay | Admitting: *Deleted

## 2012-11-13 LAB — HEMOGLOBIN AND HEMATOCRIT, BLOOD
HCT: 32.8 % — ABNORMAL LOW (ref 36.0–46.0)
Hemoglobin: 10.9 g/dL — ABNORMAL LOW (ref 12.0–15.0)

## 2012-12-06 ENCOUNTER — Encounter (INDEPENDENT_AMBULATORY_CARE_PROVIDER_SITE_OTHER): Payer: Self-pay | Admitting: Internal Medicine

## 2012-12-06 ENCOUNTER — Ambulatory Visit (INDEPENDENT_AMBULATORY_CARE_PROVIDER_SITE_OTHER): Payer: Medicare Other | Admitting: Internal Medicine

## 2012-12-06 VITALS — BP 118/58 | HR 70 | Temp 97.7°F | Ht 62.0 in | Wt 153.9 lb

## 2012-12-06 DIAGNOSIS — R197 Diarrhea, unspecified: Secondary | ICD-10-CM

## 2012-12-06 DIAGNOSIS — Z85038 Personal history of other malignant neoplasm of large intestine: Secondary | ICD-10-CM

## 2012-12-06 NOTE — Progress Notes (Signed)
Subjective:     Patient ID: Laura Parks, female   DOB: Aug 23, 1930, 79 y.o.   MRN: [id number]  HPI Here today for f/u. She tells me she is doing good.  Her appetite is good. There is no weight loss. No dysphagia. She occasionally has acid reflux. Occasionally has lower abdominal cramps if she has diarrhea. She is having 1 stool a day and are formed. The Fibercon helps with her stools.  No melena or bright red rectal bleeding.  Hx of colon cancer and rt hemicolectomy in 1993.  EGD 08/04/2012 for anemia.  Impression:  Erosive antral gastritis with few antral telangiectasia without stigmata of bleed.  No evidence of peptic ulcer disease.  Random biopsies taken from post bulbar mucosa to rule out celiac disease. H. pyloria positive and treated  With Pylera and Protonix  Colonoscopy in July of 2013 for hx colon cancer.:  Impression:  Normal distal small bowel.  Single benign appearing ulcer at ileocolonic anastomosis. It was biopsied for histology.  Scattered diverticula throughout the colon.  No endoscopic evidence of colitis. Biopsy taken to rule out microscopic and or collagenous colitis  Review of Systems see hpi Current Outpatient Prescriptions  Medication Sig Dispense Refill  . alendronate (FOSAMAX) 70 MG tablet Take 70 mg by mouth every 7 (seven) days. Take with a full glass of water on an empty stomach.      Marland Kitchen atorvastatin (LIPITOR) 10 MG tablet Take 10 mg by mouth daily.      . calcium carbonate (OS-CAL) 600 MG TABS Take 600 mg by mouth daily.      . ergocalciferol (VITAMIN D2) 50000 UNITS capsule Take 50,000 Units by mouth once a week. On Sundays      . ferrous sulfate 325 (65 FE) MG tablet Take 325 mg by mouth daily.       . Lactobacillus (FLORAJEN ACIDOPHILUS) CAPS Take 2 capsules by mouth daily.       . lisinopril (PRINIVIL,ZESTRIL) 40 MG tablet Take 40 mg by mouth daily.      . loperamide (IMODIUM) 2 MG capsule Take 2 mg by mouth 2 (two) times daily as needed. For diarrhea       . metFORMIN (GLUCOPHAGE) 500 MG tablet Take 1,000 mg by mouth 2 (two) times daily.       . metoprolol succinate (TOPROL-XL) 50 MG 24 hr tablet Take 50 mg by mouth daily. Take with or immediately following a meal.      . Multiple Vitamin (MULTIVITAMIN WITH MINERALS) TABS Take 1 tablet by mouth daily.      . pioglitazone (ACTOS) 30 MG tablet Take 30 mg by mouth daily.      . polycarbophil (FIBERCON) 625 MG tablet Take 1,250 mg by mouth 2 (two) times daily.       No current facility-administered medications for this visit.   Past Medical History  Diagnosis Date  . Diabetes mellitus     type 2 x 20 yrs  . Colon cancer   . Anemia   . Hypertension   . High cholesterol   . Macular degeneration   . Pneumonia, pneumococcal    Past Surgical History  Procedure Laterality Date  . Cataract extraction    . Cholecystectomy    . Appendectomy    . Rt knee surgery for a fx    . Colon surgery for colon cancer      19 92 by Dr Mendel Ryder  . Tubal ligation    . Colonoscopy  03/24/2012  Procedure: COLONOSCOPY;  Surgeon: Rogene Houston, MD;  Location: [facility];  Service: Endoscopy;  Laterality: N/A;  1200  . Esophagogastroduodenoscopy  08/04/2012    Procedure: ESOPHAGOGASTRODUODENOSCOPY (EGD);  Surgeon: Rogene Houston, MD;  Location: [facility];  Service: Endoscopy;  Laterality: N/A;  325   Allergies  Allergen Reactions  . Codeine Nausea Only        Objective:   Physical Exam  Filed Vitals:   12/06/12 1058  BP: 118/58  Pulse: 70  Temp: 97.7 F (36.5 C)  Height: 5\' 2"  (1.575 m)  Weight: 153 lb 14.4 oz (69.809 kg)  Alert and oriented. Skin warm and dry. Oral mucosa is moist.   . Sclera anicteric, conjunctivae is pink. Thyroid not enlarged. No cervical lymphadenopathy. Lungs clear. Heart regular rate and rhythm.  Abdomen is soft. Bowel sounds are positive. No hepatomegaly. No abdominal masses felt. No tenderness.  No edema to lower extremities.  .      Assessment:    Hx of  colon cancer. Diarrhea resolved. She is asymptomatic at this time.    Plan:     Continue to [employer]. OV in one year. If any problems, she will call our office.

## 2012-12-06 NOTE — Patient Instructions (Addendum)
OV in 1 yr.. Continue Fibercon

## 2013-09-05 ENCOUNTER — Encounter (INDEPENDENT_AMBULATORY_CARE_PROVIDER_SITE_OTHER): Payer: Self-pay | Admitting: *Deleted

## 2013-12-06 ENCOUNTER — Ambulatory Visit (INDEPENDENT_AMBULATORY_CARE_PROVIDER_SITE_OTHER): Payer: Medicare Other | Admitting: Internal Medicine

## 2013-12-06 ENCOUNTER — Encounter (INDEPENDENT_AMBULATORY_CARE_PROVIDER_SITE_OTHER): Payer: Self-pay | Admitting: Internal Medicine

## 2013-12-06 VITALS — BP 128/52 | HR 68 | Temp 98.1°F | Ht 62.0 in | Wt 147.2 lb

## 2013-12-06 DIAGNOSIS — D509 Iron deficiency anemia, unspecified: Secondary | ICD-10-CM

## 2013-12-06 DIAGNOSIS — C189 Malignant neoplasm of colon, unspecified: Secondary | ICD-10-CM

## 2013-12-06 LAB — CBC
HEMATOCRIT: 30 % — AB (ref 36.0–46.0)
HEMOGLOBIN: 10.2 g/dL — AB (ref 12.0–15.0)
MCH: 28.5 pg (ref 26.0–34.0)
MCHC: 34 g/dL (ref 30.0–36.0)
MCV: 83.8 fL (ref 78.0–100.0)
Platelets: 162 10*3/uL (ref 150–400)
RBC: 3.58 MIL/uL — ABNORMAL LOW (ref 3.87–5.11)
RDW: 15.5 % (ref 11.5–15.5)
WBC: 6.2 10*3/uL (ref 4.0–10.5)

## 2013-12-06 NOTE — Progress Notes (Signed)
Subjective:     Patient ID: Laura Parks, female   DOB: 1930-05-26, 80 y.o.   MRN: [id number]  HPI Here today for f/u. She tells me she is doing okay.  Hx of colon cancer in 1992.  Appetite is good. She has lost about 6 pounds since her last visit in March of 2013 (Weight 153). There is no abdominal pain. No melena or bright red rectal bleeding. She occasionally has diarrhea. She usually has a BM every day. No change in caliber. The patient has history of colon carcinoma and underwent surveillance  colonoscopy on March 24, 2012, revealing patent anastomosis with single  ulcer and biopsy revealed benign etiology. She also has scattered  diverticula throughout the colon. Biopsy from her sigmoid colon was  negative for microscopic or collagenous colitis.    08/04/2012 EGD: iron deficiency anemia, Dr. Laural Golden: Impression:  Erosive antral gastritis with few antral telangiectasia without stigmata of bleed.  No evidence of peptic ulcer disease.  Random biopsies taken from post bulbar mucosa to rule out celiac disease.     10/14/2012 H and H 10.9 and 32.8  08/04/2012: H. pylori is positive Results reviewed with patient Prescription for Pylera and pantoprazole sent to her pharmacy    Colonoscopy 11/09 Dr. Laural Golden: Normal . Exam performed to ileocolonic anastomosis in region of hepatic flexure which was normal. No evidence of polyps and or tumor. Few scattered diverticula at sigmoid. Hx hemicolectomy.  Review of Systems see hpi   Review of Systems see hpi  Past Medical History  Diagnosis Date  . Diabetes mellitus     type 2 x 20 yrs  . Colon cancer   . Anemia   . Hypertension   . High cholesterol   . Macular degeneration   . Pneumonia, pneumococcal     Past Surgical History  Procedure Laterality Date  . Cataract extraction    . Cholecystectomy    . Appendectomy    . Rt knee surgery for a fx    . Colon surgery for colon cancer      1992 by Dr Mendel Ryder  . Tubal ligation    .  Colonoscopy  03/24/2012    Procedure: COLONOSCOPY;  Surgeon: Rogene Houston, MD;  Location: [facility];  Service: Endoscopy;  Laterality: N/A;  1200  . Esophagogastroduodenoscopy  08/04/2012    Procedure: ESOPHAGOGASTRODUODENOSCOPY (EGD);  Surgeon: Rogene Houston, MD;  Location: [facility];  Service: Endoscopy;  Laterality: N/A;  325    Allergies  Allergen Reactions  . Codeine Nausea Only    Current Outpatient Prescriptions on File Prior to Visit  Medication Sig Dispense Refill  . alendronate (FOSAMAX) 70 MG tablet Take 70 mg by mouth every 7 (seven) days. Take with a full glass of water on an empty stomach.      Marland Kitchen atorvastatin (LIPITOR) 10 MG tablet Take 10 mg by mouth daily.      . ergocalciferol (VITAMIN D2) 50000 UNITS capsule Take 50,000 Units by mouth once a week. On Sundays      . ferrous sulfate 325 (65 FE) MG tablet Take 325 mg by mouth daily.       . Lactobacillus (FLORAJEN ACIDOPHILUS) CAPS Take 2 capsules by mouth daily.       Marland Kitchen lisinopril (PRINIVIL,ZESTRIL) 40 MG tablet Take 40 mg by mouth daily.      Marland Kitchen loperamide (IMODIUM) 2 MG capsule Take 2 mg by mouth 2 (two) times daily as needed. For diarrhea      .  metFORMIN (GLUCOPHAGE) 500 MG tablet Take 1,000 mg by mouth 2 (two) times daily.       . metoprolol succinate (TOPROL-XL) 50 MG 24 hr tablet Take 50 mg by mouth daily. Take with or immediately following a meal.      . Multiple Vitamin (MULTIVITAMIN WITH MINERALS) TABS Take 1 tablet by mouth daily.      . pioglitazone (ACTOS) 30 MG tablet Take 30 mg by mouth daily.      . polycarbophil (FIBERCON) 625 MG tablet Take 1,250 mg by mouth 2 (two) times daily.       No current facility-administered medications on file prior to visit.    Past Medical History  Diagnosis Date  . Diabetes mellitus     type 2 x 20 yrs  . Colon cancer   . Anemia   . Hypertension   . High cholesterol   . Macular degeneration   . Pneumonia, pneumococcal     Past Surgical History   Procedure Laterality Date  . Cataract extraction    . Cholecystectomy    . Appendectomy    . Rt knee surgery for a fx    . Colon surgery for colon cancer      1992 by Dr Mendel Ryder  . Tubal ligation    . Colonoscopy  03/24/2012    Procedure: COLONOSCOPY;  Surgeon: Rogene Houston, MD;  Location: [facility];  Service: Endoscopy;  Laterality: N/A;  1200  . Esophagogastroduodenoscopy  08/04/2012    Procedure: ESOPHAGOGASTRODUODENOSCOPY (EGD);  Surgeon: Rogene Houston, MD;  Location: [facility];  Service: Endoscopy;  Laterality: N/A;  325    Allergies  Allergen Reactions  . Codeine Nausea Only    Current Outpatient Prescriptions on File Prior to Visit  Medication Sig Dispense Refill  . alendronate (FOSAMAX) 70 MG tablet Take 70 mg by mouth every 7 (seven) days. Take with a full glass of water on an empty stomach.      Marland Kitchen atorvastatin (LIPITOR) 10 MG tablet Take 10 mg by mouth daily.      . ergocalciferol (VITAMIN D2) 50000 UNITS capsule Take 50,000 Units by mouth once a week. On Sundays      . ferrous sulfate 325 (65 FE) MG tablet Take 325 mg by mouth daily.       . Lactobacillus (FLORAJEN ACIDOPHILUS) CAPS Take 2 capsules by mouth daily.       Marland Kitchen lisinopril (PRINIVIL,ZESTRIL) 40 MG tablet Take 40 mg by mouth daily.      Marland Kitchen loperamide (IMODIUM) 2 MG capsule Take 2 mg by mouth 2 (two) times daily as needed. For diarrhea      . metFORMIN (GLUCOPHAGE) 500 MG tablet Take 1,000 mg by mouth 2 (two) times daily.       . metoprolol succinate (TOPROL-XL) 50 MG 24 hr tablet Take 50 mg by mouth daily. Take with or immediately following a meal.      . Multiple Vitamin (MULTIVITAMIN WITH MINERALS) TABS Take 1 tablet by mouth daily.      . pioglitazone (ACTOS) 30 MG tablet Take 30 mg by mouth daily.      . polycarbophil (FIBERCON) 625 MG tablet Take 1,250 mg by mouth 2 (two) times daily.       No current facility-administered medications on file prior to visit.   Married. 3 children. One son  deceased from a GSW.Marland Kitchen Retired from [employer] in [place]     Objective:   Physical Exam  [place]:  12/06/13 1027  BP: 128/52  Pulse: 68  Temp: 98.1 F (36.7 C)  Height: 5\' 2"  (1.575 m)  Weight: 147 lb 3.2 oz (66.769 kg)  Alert and oriented. Skin warm and dry. Oral mucosa is moist.   . Sclera anicteric, conjunctivae is pink. Thyroid not enlarged. No cervical lymphadenopathy. Lungs clear. Heart regular rate and rhythm.  Abdomen is soft. Bowel sounds are positive. No hepatomegaly. No abdominal masses felt. No tenderness.  No edema to lower extremities. Stool brown and guaiac negative.      Assessment:    Iron deficiency anemia. Presently taking iron.  Hx of colon cancer. Last colonoscopy in 2013. EGD in 2013    Plan:     CBC today. OV in 1 yr,. Continue taking Iron.

## 2013-12-06 NOTE — Patient Instructions (Signed)
CBC today. OV in 1 yr. Continue Iron

## 2013-12-08 ENCOUNTER — Telehealth (INDEPENDENT_AMBULATORY_CARE_PROVIDER_SITE_OTHER): Payer: Self-pay | Admitting: *Deleted

## 2013-12-08 DIAGNOSIS — D509 Iron deficiency anemia, unspecified: Secondary | ICD-10-CM

## 2013-12-08 NOTE — Telephone Encounter (Signed)
.  Per Lelon Perla the patient is to have this lab prior to office visit next year

## 2013-12-08 NOTE — Telephone Encounter (Signed)
1 yr f/u is noted on patient's chart.

## 2014-01-09 ENCOUNTER — Ambulatory Visit: Payer: Medicare Other | Admitting: Cardiology

## 2014-02-01 ENCOUNTER — Encounter (INDEPENDENT_AMBULATORY_CARE_PROVIDER_SITE_OTHER): Payer: Self-pay | Admitting: Internal Medicine

## 2014-02-01 ENCOUNTER — Ambulatory Visit (INDEPENDENT_AMBULATORY_CARE_PROVIDER_SITE_OTHER): Payer: Medicare Other | Admitting: Internal Medicine

## 2014-02-01 VITALS — BP 136/66 | HR 80 | Temp 98.0°F | Ht 62.0 in | Wt 146.2 lb

## 2014-02-01 DIAGNOSIS — K5900 Constipation, unspecified: Secondary | ICD-10-CM | POA: Insufficient documentation

## 2014-02-01 NOTE — Progress Notes (Signed)
Subjective:     Patient ID: Laura Parks, female   DOB: 1930/01/13, 80 y.o.   MRN: [id number] Please note that patient did not bring her medications in today. I cannot verify her medications. HPI Presents today with c/o that she thinks she became constipated while taking the Iron. She had not had a BM in several days.  She started taking the Iron last week. She tried enemas and stool softener, and Ducolax with no results.  She saw K. Hairfield PA last week (Thursday) and a chest xray  was normal. She was also advised to take Metamucil. She had a BM Thursday afternoon and her next [facility] Saturday. Stools were like pudding. Sunday her stools were loose. Last night she had a large BM but was loose. No melena or bright red rectal bleeding.       The patient has history of colon carcinoma and underwent surveillance  colonoscopy on March 24, 2012, revealing patent anastomosis with single  ulcer and biopsy revealed benign etiology. She also has scattered  diverticula throughout the colon. Biopsy from her sigmoid colon was  negative for microscopic or collagenous colitis.  08/04/2012 EGD: iron deficiency anemia, Dr. Rehman:  Impression:  Erosive antral gastritis with few antral telangiectasia without stigmata of bleed.  No evidence of peptic ulcer disease.  Random biopsies taken from post bulbar mucosa to rule out celiac disease.  10/14/2012 H and H 10.9 and 32.8   08/04/2012: H. pylori is positive Results reviewed with patient Prescription for Pylera and pantoprazole sent to her pharmacy Colonoscopy 11/09 Dr. Rehman: Normal . Exam performed to ileocolonic anastomosis in region of hepatic flexure which was normal. No evidence of polyps and or tumor. Few scattered diverticula at sigmoid. Hx hemicolectomy.   Review of Systems Past Medical History  Diagnosis Date  . Diabetes mellitus     type 2 x 20 yrs  . Colon cancer   . Anemia   . Hypertension   . High cholesterol   . Macular degeneration    . Pneumonia, pneumococcal     Past Surgical History  Procedure Laterality Date  . Cataract extraction    . Cholecystectomy    . Appendectomy    . Rt knee surgery for a fx    . Colon surgery for colon cancer      19 92 by Dr Mendel Ryder  . Tubal ligation    . Colonoscopy  03/24/2012    Procedure: COLONOSCOPY;  Surgeon: Rogene Houston, MD;  [facility]: [facility];  Service: Endoscopy;  Laterality: N/A;  1200  . Esophagogastroduodenoscopy  08/04/2012    Procedure: ESOPHAGOGASTRODUODENOSCOPY (EGD);  Surgeon: Rogene Houston, MD;  Location: [facility];  Service: Endoscopy;  Laterality: N/A;  325    Allergies  Allergen Reactions  . Codeine Nausea Only    Current Outpatient Prescriptions on File Prior to Visit  Medication Sig Dispense Refill  . alendronate (FOSAMAX) 70 MG tablet Take 70 mg by mouth every 7 (seven) days. Take with a full glass of water on an empty stomach.      Marland Kitchen atorvastatin (LIPITOR) 10 MG tablet Take 10 mg by mouth daily.      . ergocalciferol (VITAMIN D2) 50000 UNITS capsule Take 50,000 Units by mouth once a week. On Sundays      . ferrous sulfate 325 (65 FE) MG tablet Take 325 mg by mouth daily.       Marland Kitchen ibuprofen (ADVIL,MOTRIN) 800 MG tablet Take 800 mg by mouth every  8 (eight) hours as needed.      . Lactobacillus (FLORAJEN ACIDOPHILUS) CAPS Take 2 capsules by mouth daily.       Marland Kitchen lisinopril (PRINIVIL,ZESTRIL) 40 MG tablet Take 40 mg by mouth daily.      Marland Kitchen loperamide (IMODIUM) 2 MG capsule Take 2 mg by mouth 2 (two) times daily as needed. For diarrhea      . metFORMIN (GLUCOPHAGE) 500 MG tablet Take 1,000 mg by mouth 2 (two) times daily.       . metoprolol succinate (TOPROL-XL) 50 MG 24 hr tablet Take 50 mg by mouth daily. Take with or immediately following a meal.      . Multiple Vitamin (MULTIVITAMIN WITH MINERALS) TABS Take 1 tablet by mouth daily.      . pioglitazone (ACTOS) 30 MG tablet Take 30 mg by mouth daily.      . polycarbophil (FIBERCON) 625 MG tablet  Take 1,250 mg by mouth 2 (two) times daily.       No current facility-administered medications on file prior to visit.        Objective:   Physical Exam  Filed Vitals:   02/01/14 1029  BP: 136/66  Pulse: 80  Temp: 98 F (36.7 C)  Height: 5\' 2"  (1.575 m)  Weight: 146 lb 3.2 oz (66.316 kg)  Alert and oriented. Skin warm and dry. Oral mucosa is moist.   . Sclera anicteric, conjunctivae is pink. Thyroid not enlarged. No cervical lymphadenopathy. Lungs clear. Heart regular rate and rhythm.  Abdomen is soft. Bowel sounds are positive. No hepatomegaly. No abdominal masses felt. No tenderness.  No edema to lower extremities.    No stool in rectum.     Assessment:     Constipation which I think has now resolved. Af Suspect her symptoms of loose stools are from multiple laxatives and enemas she had been taking over the pat few days.    Plan:     Fiber tabs 2 a day. PR report tomorrow.. OV in 2 weeks.

## 2014-02-01 NOTE — Patient Instructions (Signed)
Fiber 2 tabs day. PR tomorrow.

## 2014-02-02 ENCOUNTER — Encounter: Payer: Self-pay | Admitting: Cardiology

## 2014-02-02 ENCOUNTER — Telehealth (INDEPENDENT_AMBULATORY_CARE_PROVIDER_SITE_OTHER): Payer: Self-pay | Admitting: *Deleted

## 2014-02-02 ENCOUNTER — Ambulatory Visit (INDEPENDENT_AMBULATORY_CARE_PROVIDER_SITE_OTHER): Payer: Medicare Other | Admitting: Cardiology

## 2014-02-02 VITALS — BP 165/75 | HR 70 | Ht 62.0 in | Wt 146.0 lb

## 2014-02-02 DIAGNOSIS — I6529 Occlusion and stenosis of unspecified carotid artery: Secondary | ICD-10-CM

## 2014-02-02 DIAGNOSIS — I493 Ventricular premature depolarization: Secondary | ICD-10-CM | POA: Insufficient documentation

## 2014-02-02 DIAGNOSIS — E782 Mixed hyperlipidemia: Secondary | ICD-10-CM

## 2014-02-02 DIAGNOSIS — I10 Essential (primary) hypertension: Secondary | ICD-10-CM

## 2014-02-02 DIAGNOSIS — I4949 Other premature depolarization: Secondary | ICD-10-CM

## 2014-02-02 DIAGNOSIS — E119 Type 2 diabetes mellitus without complications: Secondary | ICD-10-CM

## 2014-02-02 NOTE — Assessment & Plan Note (Signed)
Patient told that she has an "irregular" heartbeat, possibly secondary to PVCs. ECG reviewed today. She does not endorse any chest pain, dizziness, or syncope. LVEF was normal by echocardiogram over the last 6 months. Continue observation for now.

## 2014-02-02 NOTE — Progress Notes (Signed)
Clinical Summary Laura Parks is an 80 y.o.female referred for cardiology consultation by Dr. Woody Seller. She is here with her daughter. She does not endorse any chest pain with exertion or unusual shortness of breath. Has been told that she has an "irregular" heartbeat, but is not aware of any palpitations, has had no dizziness or syncope. She does recall being told during a health screening approximately 5 years ago that she had a "mild blockage" on the left side of her neck.  Echocardiogram done at [facility] in October 2014 reported normal LV wall thickness with LVEF 60-65%, mild left atrial enlargement, severe MAC with mildly thickened mitral valve and mild mitral regurgitation, moderately sclerotic aortic valve, mild tricuspid regurgitation.  ECG from March 2012 showed sinus rhythm with right bundle branch block and left anterior fascicular block. Followup tracing today shows similar conduction pattern with PVCs.  She is on aspirin and statin therapy. She reports no speech deficits or focal motor weakness.  Allergies  Allergen Reactions  . Codeine Nausea Only    Current Outpatient Prescriptions  Medication Sig Dispense Refill  . alendronate (FOSAMAX) 70 MG tablet Take 70 mg by mouth every 7 (seven) days. Take with a full glass of water on an empty stomach.      Marland Kitchen aspirin 81 MG tablet Take 81 mg by mouth daily.      Marland Kitchen atorvastatin (LIPITOR) 10 MG tablet Take 10 mg by mouth daily.      . ergocalciferol (VITAMIN D2) 50000 UNITS capsule Take 50,000 Units by mouth once a week. On Sundays      . ferrous gluconate (FERGON) 324 MG tablet Take 324 mg by mouth daily with breakfast.      . ferrous sulfate 325 (65 FE) MG tablet Take 325 mg by mouth daily with breakfast.      . furosemide (LASIX) 20 MG tablet Take 10 mg by mouth.      . lisinopril (PRINIVIL,ZESTRIL) 40 MG tablet Take 40 mg by mouth daily.      . metFORMIN (GLUCOPHAGE) 500 MG tablet Take 1,000 mg by mouth 2 (two) times  daily.       . pioglitazone (ACTOS) 30 MG tablet Take 30 mg by mouth daily.       No current facility-administered medications for this visit.    Past Medical History  Diagnosis Date  . Type 2 diabetes mellitus   . Colon cancer   . Anemia   . Essential hypertension, benign   . Mixed hyperlipidemia   . Macular degeneration   . History of pneumonia   . Iron deficiency anemia     Erosive antral gastritis    Past Surgical History  Procedure Laterality Date  . Cataract extraction    . Cholecystectomy    . Appendectomy    . Right knee surgery    . Colon surgery for colon cancer      19 92 by Dr Mendel Ryder  . Tubal ligation    . Colonoscopy  03/24/2012    Procedure: COLONOSCOPY;  Surgeon: Rogene Houston, MD;  [facility]: [facility];  Service: Endoscopy;  Laterality: N/A;  1200  . Esophagogastroduodenoscopy  08/04/2012    Procedure: ESOPHAGOGASTRODUODENOSCOPY (EGD);  Surgeon: Rogene Houston, MD;  [facility]: [facility];  Service: Endoscopy;  Laterality: N/A;  325    Family History  Problem Relation Age of Onset  . COPD Mother   . Diabetes Brother     Social History Laura Parks reports  that she has never smoked. She has never used smokeless tobacco. Laura Parks reports that she does not drink alcohol.  Review of Systems Limited vision related to macular degeneration. Arthritic pains. Also hard of hearing. Otherwise as outlined.  Physical Examination Filed Vitals:   02/02/14 0912  BP: 165/75  Pulse: 70   Filed Weights   02/02/14 0912  Weight: 146 lb (66.225 kg)   Patient appears comfortable at rest. HEENT: Conjunctiva and lids normal, oropharynx clear. Neck: Supple, no elevated JVP, soft right carotid bruit, no thyromegaly. Lungs: Clear to auscultation, nonlabored breathing at rest. Cardiac: Regular rate and rhythm, no S3, soft systolic murmur, no pericardial rub. Abdomen: Soft, nontender, bowel sounds present, no guarding or rebound. Extremities: No pitting edema,  distal pulses 2+. Skin: Warm and dry. Musculoskeletal: No kyphosis. Neuropsychiatric: Alert and oriented x3, affect grossly appropriate. Hard of hearing.   Problem List and Plan   Carotid artery occlusion Patient reports being told that she had a "mild blockage" on the left side based on a health screening 5 years ago. She has a soft right carotid bruit today. Plan is to obtain carotid Dopplers for further evaluation. She is on aspirin and statin therapy, would aim for LDL control and her 100 if possible.  PVC's (premature ventricular contractions) Patient told that she has an "irregular" heartbeat, possibly secondary to PVCs. ECG reviewed today. She does not endorse any chest pain, dizziness, or syncope. LVEF was normal by echocardiogram over the last 6 months. Continue observation for now.  Mixed hyperlipidemia Continues on Lipitor. Followed by Woody Seller.  Type 2 diabetes mellitus Reportedly long-standing, followed by Dr. Woody Seller.  Essential hypertension, benign On ACE inhibitor, followed by Dr. Woody Seller.    Satira Sark, M.D., F.A.C.C.

## 2014-02-02 NOTE — Telephone Encounter (Signed)
Will call tomorrow 

## 2014-02-02 NOTE — Assessment & Plan Note (Signed)
Reportedly long-standing, followed by Dr. Woody Seller.

## 2014-02-02 NOTE — Patient Instructions (Signed)
Your physician recommends that you schedule a follow-up appointment in: 6 months. You will receive a reminder letter in the mail in about 4 months reminding you to call and schedule your appointment. If you don't receive this letter, please contact our office. Your physician recommends that you continue on your current medications as directed. Please refer to the Current Medication list given to you today. Your physician has requested that you have a carotid duplex. This test is an ultrasound of the carotid arteries in your neck. It looks at blood flow through these arteries that supply the brain with blood. Allow one hour for this exam. There are no restrictions or special instructions.

## 2014-02-02 NOTE — Telephone Encounter (Signed)
Laura Parks- Patient called a with a progress report.  States that she had a small Mm earlier this morning , then a larger one later in the day. She is taking the Fiber twice a day as you instructed her. States that the stools are loose.  Patient also states that she is on Ferrous (Iron) , didn't know it until she called th Pharmacy. She wants to stop the Iron. I advised her that she would have to see her PCP , the prescriber , to discuss this.

## 2014-02-02 NOTE — Assessment & Plan Note (Signed)
Patient reports being told that she had a "mild blockage" on the left side based on a health screening 5 years ago. She has a soft right carotid bruit today. Plan is to obtain carotid Dopplers for further evaluation. She is on aspirin and statin therapy, would aim for LDL control and her 100 if possible.

## 2014-02-02 NOTE — Assessment & Plan Note (Signed)
On ACE inhibitor, followed by Dr. Woody Seller.

## 2014-02-02 NOTE — Assessment & Plan Note (Signed)
Continues on Lipitor. Followed by Woody Seller.

## 2014-02-07 NOTE — Telephone Encounter (Signed)
Stools are much better. She will continue to take the fiber pills.

## 2014-02-14 ENCOUNTER — Encounter (INDEPENDENT_AMBULATORY_CARE_PROVIDER_SITE_OTHER): Payer: Medicare Other

## 2014-02-14 DIAGNOSIS — I6529 Occlusion and stenosis of unspecified carotid artery: Secondary | ICD-10-CM

## 2014-02-16 ENCOUNTER — Telehealth: Payer: Self-pay | Admitting: *Deleted

## 2014-02-16 NOTE — Telephone Encounter (Signed)
Patient informed. 

## 2014-02-16 NOTE — Telephone Encounter (Signed)
Message copied by Merlene Laughter on Thu Feb 16, 2014  4:17 PM ------      Message from: MCDOWELL, Aloha Gell      Created: Wed Feb 15, 2014  7:56 AM       Reviewed. There is evidence of 40-59% bilateral ICA stenosis by Dopplers. She should continue medical therapy which includes aspirin and statin, try and keep LDL under 100. We will plan a followup carotid Doppler for one year. ------

## 2014-06-06 ENCOUNTER — Encounter: Payer: Self-pay | Admitting: Cardiology

## 2014-07-08 ENCOUNTER — Encounter: Payer: Self-pay | Admitting: Cardiology

## 2014-07-25 ENCOUNTER — Encounter: Payer: Self-pay | Admitting: Neurology

## 2014-07-25 ENCOUNTER — Ambulatory Visit (INDEPENDENT_AMBULATORY_CARE_PROVIDER_SITE_OTHER): Payer: Medicare Other | Admitting: Neurology

## 2014-07-25 VITALS — BP 161/66 | HR 58 | Temp 97.1°F | Ht 62.0 in | Wt 156.0 lb

## 2014-07-25 DIAGNOSIS — F03918 Unspecified dementia, unspecified severity, with other behavioral disturbance: Secondary | ICD-10-CM

## 2014-07-25 DIAGNOSIS — R5382 Chronic fatigue, unspecified: Secondary | ICD-10-CM

## 2014-07-25 DIAGNOSIS — F32A Depression, unspecified: Secondary | ICD-10-CM

## 2014-07-25 DIAGNOSIS — F411 Generalized anxiety disorder: Secondary | ICD-10-CM

## 2014-07-25 DIAGNOSIS — F0391 Unspecified dementia with behavioral disturbance: Secondary | ICD-10-CM

## 2014-07-25 DIAGNOSIS — R4189 Other symptoms and signs involving cognitive functions and awareness: Secondary | ICD-10-CM

## 2014-07-25 DIAGNOSIS — F329 Major depressive disorder, single episode, unspecified: Secondary | ICD-10-CM

## 2014-07-25 DIAGNOSIS — R269 Unspecified abnormalities of gait and mobility: Secondary | ICD-10-CM

## 2014-07-25 DIAGNOSIS — F22 Delusional disorders: Secondary | ICD-10-CM

## 2014-07-25 DIAGNOSIS — E538 Deficiency of other specified B group vitamins: Secondary | ICD-10-CM

## 2014-07-25 DIAGNOSIS — I6529 Occlusion and stenosis of unspecified carotid artery: Secondary | ICD-10-CM

## 2014-07-25 MED ORDER — FLUOXETINE HCL 20 MG PO CAPS: 20.0000 mg | ORAL_CAPSULE | Freq: Every day | ORAL | Status: DC

## 2014-07-25 MED ORDER — CLONAZEPAM 0.5 MG PO TABS: 0.2500 mg | ORAL_TABLET | Freq: Two times a day (BID) | ORAL | Status: DC | PRN

## 2014-07-25 NOTE — Progress Notes (Signed)
[facility]    Provider:  Dr Jaynee Eagles Referring Provider: Glenda Chroman., MD Primary Care Physician:  Glenda Chroman., MD  CC:  Memory changes and delusions, gait impairment  HPI:  Laura Parks is a 80 y.o. female here as a referral from Dr. Woody Seller for cognitive and behavioral changes. Daughter provides most of the information. 3 years ago noticed changes in her mother, she was losing her glasses and losing her purse and articles of clothing. It continued and progressed. Patient doesn't want to socially interact, doesn't want to go to church on Sundays and used to love to go dancing and won't go there either. She has stopped wanting to do anything. Daughter and son pay bills. She doesn't cook anymore, last night she tried and burned the food. She doesn't cook because she leaves burners on. This past summer daughter noticed worsening changes. In October patient became silent and distant, "in her own world". Then on October 19th patient asked daughter to come home and said it was going to be all right, patient thought the police were going to arrest her for brother's death and she is going to prison. Patient has been perseverating on going to jail since then and dieing in prison. Patient reports she did have something to do with her brother's death but doesn't know what she did. She says "I have no joy, no love" in my life. Patient agrees she is having memory problems, forgetting to turn the stove off. She is forgetting to feed the dog recently. Husband is also noticing the changes in memory and doesn't know what to do about it.   She feels sad most days, she has loss of interest in almost everything she used to do. She used to go shopping and doesn't want to do that anymore. She endorses decreased energy, fatigue, irregular sleep, decreased appetite and decreased intake, "I simply do not want anymore to eat". Restless all day.   Daughter also reports her mother is slowing down.  Balance is poor, no falls. But has lost her balance.   Mother with alzheimers.   Reviewed notes, labs and imaging from outside physicians, which showed: cbc 11/2013 with anemia. cmp unremarkable. hgba1c 5.6. ldl 63. tsh wnl. Carotid doppler study 02/2014 with evidence of 40-59% bilateral ICA stenosis. Echocardiogram done at [facility] in October 2014 reported normal LV wall thickness with LVEF 60-65%, mild left atrial enlargement, severe MAC with mildly thickened mitral valve and mild mitral regurgitation, moderately sclerotic aortic valve, mild tricuspid regurgitation. The patient has history of colon carcinoma. A review of notes in [facility] through 07/2012 (mainly cardiology and gastro) don't mention any apparent cognitive dysfunction. Reviewed primary care notes provided from [facility] internal, last OP note mentions that patient and granddaughter requesting a neuro referral for recent delusions about going to prison, changes in walking, leaving stove on, inability to do serial 7s and draw clock.    Review of Systems: Patient complains of symptoms per HPI as well as the following symptoms: fatigue, loss of vision, easy bruising, cough, murmur, swelling in legs, feeling cold, hearing loss, trouble swallowing, chronic diarrhea, memory loss, confusion, weakness, sleepiness, slurred speech, difficulty swallowing, dizziness, anxiety, too much sleep, decreased energy, change in appetite, disinterest in activity, hallucinations, racing thoughts. Pertinent negatives per HPI. All others negative.   History   Social History  . Marital Status: Married    Spouse Name: N/A    Number of Children: N/A  . Years of Education:  N/A   Occupational History  . Not on file.   Social History Main Topics  . Smoking status: Never Smoker   . Smokeless tobacco: Never Used  . Alcohol Use: No  . Drug Use: No  . Sexual Activity: Not on file   Other Topics Concern  . Not on file   Social History Narrative     Family History  Problem Relation Age of Onset  . COPD Mother   . Alzheimer's disease Mother   . Congestive Heart Failure Mother   . Diabetes Brother     Past Medical History  Diagnosis Date  . Type 2 diabetes mellitus   . Colon cancer   . Anemia   . Essential hypertension, benign   . Mixed hyperlipidemia   . Macular degeneration   . History of pneumonia   . Iron deficiency anemia     Erosive antral gastritis  . Cognitive impairment     Past Surgical History  Procedure Laterality Date  . Cataract extraction    . Cholecystectomy    . Appendectomy    . Right knee surgery    . Colon surgery for colon cancer      1992 by Dr Mendel Ryder  . Tubal ligation    . Colonoscopy  03/24/2012    Procedure: COLONOSCOPY;  Surgeon: Rogene Houston, MD;  Location: [facility];  Service: Endoscopy;  Laterality: N/A;  1200  . Esophagogastroduodenoscopy  08/04/2012    Procedure: ESOPHAGOGASTRODUODENOSCOPY (EGD);  Surgeon: Rogene Houston, MD;  Location: [facility];  Service: Endoscopy;  Laterality: N/A;  325    Current Outpatient Prescriptions  Medication Sig Dispense Refill  . alendronate (FOSAMAX) 70 MG tablet Take 70 mg by mouth every 7 (seven) days. Take with a full glass of water on an empty stomach.    Marland Kitchen atorvastatin (LIPITOR) 10 MG tablet Take 10 mg by mouth daily.    . cholestyramine (QUESTRAN) 4 G packet Take 4 g by mouth 3 (three) times daily with meals.    . ciprofloxacin (CIPRO) 250 MG tablet Take 250 mg by mouth 2 (two) times daily.    . ergocalciferol (VITAMIN D2) 50000 UNITS capsule Take 50,000 Units by mouth once a week. On Sundays    . ferrous sulfate 325 (65 FE) MG tablet Take 325 mg by mouth daily with breakfast.    . furosemide (LASIX) 20 MG tablet Take 10 mg by mouth.    Marland Kitchen lisinopril (PRINIVIL,ZESTRIL) 40 MG tablet Take 40 mg by mouth daily.    . metFORMIN (GLUCOPHAGE) 500 MG tablet Take 1,000 mg by mouth 2 (two) times daily.     . metoprolol succinate (TOPROL-XL)  50 MG 24 hr tablet Take 50 mg by mouth daily. Take with or immediately following a meal.    . pioglitazone (ACTOS) 30 MG tablet Take 30 mg by mouth daily.    Marland Kitchen aspirin 81 MG tablet Take 81 mg by mouth daily.    . clonazePAM (KLONOPIN) 0.5 MG tablet Take 0.5 tablets (0.25 mg total) by mouth 2 (two) times daily as needed for anxiety. 15 tablet 1  . ferrous gluconate (FERGON) 324 MG tablet Take 324 mg by mouth daily with breakfast.    . FLUoxetine (PROZAC) 20 MG capsule Take 1 capsule (20 mg total) by mouth daily. 30 capsule 3   No current facility-administered medications for this visit.    Allergies as of 07/25/2014 - Review Complete 07/25/2014  Allergen Reaction Noted  . Codeine Nausea  Only 02/23/2012    Vitals: BP 161/66 mmHg  Pulse 58  Temp(Src) 97.1 F (36.2 C) (Oral)  Ht $R'5\' 2"'yo$  (1.575 m)  Wt 156 lb (70.761 kg)  BMI 28.53 kg/m2 Last Weight:  Wt Readings from Last 1 Encounters:  07/25/14 156 lb (70.761 kg)   Last Height:   Ht Readings from Last 1 Encounters:  07/25/14 $RemoveB'5\' 2"'grxvLykZ$  (1.575 m)   Physical exam: Exam: Gen: NAD, conversant, well nourised, obese, well groomed                     CV: RRR, mild SEM. No Carotid Bruits. No peripheral edema, warm, nontender Eyes: Conjunctivae clear without exudates or hemorrhage  Neuro: Detailed Neurologic Exam:  Speech:    Speech is normal; fluent and spontaneous with normal comprehension.    Cognition: MoCA 15/30    The patient is oriented to person, place, and time;     recent memory impaired, remote memory intact;     language fluent;     Impaired attention, concentration.  Cranial Nerves:     The pupils are equal, round, and reactive to light. Deceased vision (legally blind due to macular degeneration). The fundi are flat. Visual fields are full to finger waving. Extraocular movements are intact. Trigeminal sensation is intact and the muscles of mastication are normal. The face is symmetric. The palate elevates in the midline.  Voice is normal. Shoulder shrug is normal. The tongue has normal motion without fasciculations.   Coordination:    Normal finger to nose. Difficulty heel to shin due to body habitus. Slowed rapid alternating movements bilat.  Gait:    Mildly wide-based but small stride, decreased arm swing bilat, en-bloc turning.  Motor Observation:     Masked facies. No tremor. Tone:    Increased tone with facilitation in the upper extremities  Posture:    Posture is normal.     Strength:    Strength is intact in the upper and lower limbs.      Sensation:     Intact light touch Reflex Exam:  DTR's:    Absent achilles otherwise deep tendon reflexes in the upper and lower extremities are normal bilaterally.   Toes:    Right down. Left mute. Clonus:    Clonus is absent.  Other: No frontal release signs.        Assessment/Plan:  80 year old female with 3 years of slowly progressive memory changes with recent acute worsening including delusions that the police are going to arrest her for the murder of her brother. She endorses significant depression, "I have no joy, no love". MoCA is 15/30 with loss of points in delayed recall (-5), clock drawing -2, serial 7s(-2) but has < 12th grade education +1,naming -1,  orientation (-2), attention -1, fluency -1 but missed points because she could not see the test adequately (legally blind due to macular degeneration) which could increase to max 17/30. Possibly multifactorial could have a baseline dementing process with exacerbation secondary to depression. Exam demonstrates some parkinsonian features. Delusions/hallucinations can be a prominent feature early in lewy-body dementia and later in alzheimer's type dementia. Can also have delusions in association with severe depression.  Will start evaluation with MRI of the brain. Can start SSRI for depression, warned to stop for worsening mood or suicidal thoughts. Was prescribed Klonopin bid for anxiety which  seems to help but she is running out. Daughter asks for refill and patient says the Klonopin does help. I will  refill Klonopin bid prn, very sparingly and only as needed, for anxiety and warned that Benzodiazepine use in the elderly can cause excessive sedation and risk for falls.  Can try Sinemet after workup complete.  Will check B12 and folate, rpr and esr. Recent TSH was wnl. Follow up after workup complete 4 weeks. Highly suggested psychiatry referral but patient declined. Neuropsychiatric testing would be helpful but may be difficult to perform given patient's vision loss (legally blind), will discuss with physician.   Sarina Ill, MD  [facility] [address] [place], [place] [address]  Phone [phone] Fax [phone]

## 2014-07-25 NOTE — Patient Instructions (Signed)
Overall you are doing fairly well but I do want to suggest a few things today:   Remember to drink plenty of fluid, eat healthy meals and do not skip any meals. Try to eat protein with a every meal and eat a healthy snack such as fruit or nuts in between meals. Try to keep a regular sleep-wake schedule and try to exercise daily, particularly in the form of walking, 20-30 minutes a day, if you can.   As far as your medications are concerned, I would like to suggest: Prozac daily, Klonopin as needed up to twice daily for anxiety  As far as diagnostic testing: MRI of the brain, B12 and folate, TSH  I would like to see you back in 3 months, sooner if we need to. Please call us with any interim questions, concerns, problems, updates or refill requests.   Please also call us for any test results so we can go over those with you on the phone.  My clinical assistant and will answer any of your questions and relay your messages to me and also relay most of my messages to you.   Our phone number is [phone]. We also have an after hours call service for urgent matters and there is a physician on-call for urgent questions. For any emergencies you know to call 911 or go to the nearest emergency room

## 2014-07-26 DIAGNOSIS — F22 Delusional disorders: Secondary | ICD-10-CM | POA: Insufficient documentation

## 2014-07-26 DIAGNOSIS — R269 Unspecified abnormalities of gait and mobility: Secondary | ICD-10-CM | POA: Insufficient documentation

## 2014-07-26 DIAGNOSIS — F32A Depression, unspecified: Secondary | ICD-10-CM | POA: Insufficient documentation

## 2014-07-26 DIAGNOSIS — F329 Major depressive disorder, single episode, unspecified: Secondary | ICD-10-CM | POA: Insufficient documentation

## 2014-07-26 LAB — SEDIMENTATION RATE: Sed Rate: 10 mm/hr (ref 0–40)

## 2014-07-26 LAB — RPR: RPR: NONREACTIVE

## 2014-07-26 LAB — TSH: TSH: 3.48 u[IU]/mL (ref 0.450–4.500)

## 2014-07-26 LAB — B12 AND FOLATE PANEL
FOLATE: 8.5 ng/mL (ref >3.0)
Vitamin B-12: 194 pg/mL — ABNORMAL LOW (ref 211–946)

## 2014-07-27 ENCOUNTER — Other Ambulatory Visit: Payer: Self-pay | Admitting: Neurology

## 2014-07-31 ENCOUNTER — Telehealth: Payer: Self-pay | Admitting: Neurology

## 2014-07-31 NOTE — Telephone Encounter (Signed)
Called home number three times, husband hung the phone up on me each time. Called the cell phone which I think is Laura Parks's daughter and left a message to call me back. Trying to contact the family regarding her lab tests, specifically her low B12 in the setting of cognitive dysfunction. Will also forward this to her pcp. She will need B12 replacement therapy.

## 2014-08-01 NOTE — Telephone Encounter (Signed)
Patient's daughter [name] returning call, she can be reached at [phone].

## 2014-08-01 NOTE — Telephone Encounter (Signed)
Patient daughter returning your call. Please advise. [phone]

## 2014-08-02 ENCOUNTER — Other Ambulatory Visit: Payer: Self-pay | Admitting: Neurology

## 2014-08-02 DIAGNOSIS — R4689 Other symptoms and signs involving appearance and behavior: Principal | ICD-10-CM

## 2014-08-02 DIAGNOSIS — R4189 Other symptoms and signs involving cognitive functions and awareness: Secondary | ICD-10-CM

## 2014-08-02 NOTE — Telephone Encounter (Signed)
Spoke to patient. Suggested b12 shots. She is going to see her pcp this week and would like to discuss with hi, She can also take 1000-2022mcg of B12 daily. Will fax or forward results to Dr. Azucena Fallen at [facility]

## 2014-08-07 ENCOUNTER — Encounter: Payer: Self-pay | Admitting: Cardiology

## 2014-08-07 ENCOUNTER — Ambulatory Visit (INDEPENDENT_AMBULATORY_CARE_PROVIDER_SITE_OTHER): Payer: Medicare Other | Admitting: Cardiology

## 2014-08-07 VITALS — BP 160/87 | HR 70 | Ht 62.0 in | Wt 138.8 lb

## 2014-08-07 DIAGNOSIS — I10 Essential (primary) hypertension: Secondary | ICD-10-CM

## 2014-08-07 DIAGNOSIS — E782 Mixed hyperlipidemia: Secondary | ICD-10-CM

## 2014-08-07 DIAGNOSIS — I6523 Occlusion and stenosis of bilateral carotid arteries: Secondary | ICD-10-CM

## 2014-08-07 DIAGNOSIS — I6529 Occlusion and stenosis of unspecified carotid artery: Secondary | ICD-10-CM

## 2014-08-07 NOTE — Assessment & Plan Note (Signed)
Continues on Lipitor, keep follow-up with Dr. Woody Seller.

## 2014-08-07 NOTE — Progress Notes (Signed)
Reason for visit: Carotid artery disease  Clinical Summary Ms. Tougas is an 80 y.o.female last seen in May. She is here with her daughter from [place], visiting for Thanksgiving. No new symptoms reported. She has had no focal motor weakness or speech deficits, reports compliance with her medications, outlined below.  Carotid Dopplers from June reported 40-59% bilateral ICA stenoses. I reviewed the results with him both today. Plan at this point is to continue medical therapy.  Echocardiogram done at [facility] in October 2014 reported normal LV wall thickness with LVEF 60-65%, mild left atrial enlargement, severe MAC with mildly thickened mitral valve and mild mitral regurgitation, moderately sclerotic aortic valve, mild tricuspid regurgitation.   Allergies  Allergen Reactions  . Codeine Nausea Only    Current Outpatient Prescriptions  Medication Sig Dispense Refill  . alendronate (FOSAMAX) 70 MG tablet Take 70 mg by mouth every 7 (seven) days. Take with a full glass of water on an empty stomach.    Marland Kitchen atorvastatin (LIPITOR) 10 MG tablet Take 10 mg by mouth daily.    . cholestyramine (QUESTRAN) 4 G packet Take 4 g by mouth 2 (two) times daily.     . clonazePAM (KLONOPIN) 0.5 MG tablet Take 0.5 tablets (0.25 mg total) by mouth 2 (two) times daily as needed for anxiety. (Patient taking differently: Take 0.25 mg by mouth as needed for anxiety. ) 15 tablet 1  . ergocalciferol (VITAMIN D2) 50000 UNITS capsule Take 50,000 Units by mouth once a week. On Sundays    . ferrous sulfate 325 (65 FE) MG tablet Take 325 mg by mouth daily with breakfast.    . FLUoxetine (PROZAC) 20 MG capsule Take 1 capsule (20 mg total) by mouth daily. 30 capsule 3  . furosemide (LASIX) 20 MG tablet Take 20 mg by mouth daily.    Marland Kitchen lisinopril (PRINIVIL,ZESTRIL) 40 MG tablet Take 40 mg by mouth daily.    . metFORMIN (GLUCOPHAGE) 500 MG tablet Take 1,000 mg by mouth 2 (two) times daily.     . metoprolol  succinate (TOPROL-XL) 50 MG 24 hr tablet Take 50 mg by mouth daily. Take with or immediately following a meal.    . pioglitazone (ACTOS) 30 MG tablet Take 15 mg by mouth daily.     . vitamin B-12 (CYANOCOBALAMIN) 250 MCG tablet Take 250 mcg by mouth daily.     No current facility-administered medications for this visit.    Past Medical History  Diagnosis Date  . Type 2 diabetes mellitus   . Colon cancer   . Anemia   . Essential hypertension, benign   . Mixed hyperlipidemia   . Macular degeneration   . History of pneumonia   . Iron deficiency anemia     Erosive antral gastritis  . Cognitive impairment     Social History Ms. Delarosa reports that she has never smoked. She has never used smokeless tobacco. Ms. Moerke reports that she does not drink alcohol.  Review of Systems Complete review of systems negative except as otherwise outlined in the clinical summary and also the following.  Physical Examination Filed Vitals:   08/07/14 1520  BP: 160/87  Pulse: 70   Filed Weights   08/07/14 1520  Weight: 138 lb 12.8 oz (62.959 kg)    Patient appears comfortable at rest. HEENT: Conjunctiva and lids normal, oropharynx clear. Neck: Supple, no elevated JVP, soft right carotid bruit, no thyromegaly. Lungs: Clear to auscultation, nonlabored breathing at rest. Cardiac: Regular rate and rhythm,  no S3, soft systolic murmur, no pericardial rub. Abdomen: Soft, nontender, bowel sounds present, no guarding or rebound. Extremities: No pitting edema, distal pulses 2+.   Problem List and Plan   Carotid artery occlusion Moderate bilateral ICA disease as outlined above. Plan to follow-up carotid Dopplers for next June with clinical visit.  Essential hypertension, benign She continues on ACE inhibitor and beta blocker. Discussed sodium restriction. Keep follow-up with Dr. Woody Seller.  Mixed hyperlipidemia Continues on Lipitor, keep follow-up with Dr. Woody Seller.    Satira Sark, M.D.,  F.A.C.C.

## 2014-08-07 NOTE — Assessment & Plan Note (Signed)
Moderate bilateral ICA disease as outlined above. Plan to follow-up carotid Dopplers for next June with clinical visit.

## 2014-08-07 NOTE — Patient Instructions (Signed)
Your physician recommends that you schedule a follow-up appointment in: 7 months. You will receive a reminder letter in the mail in about 5 months reminding you to call and schedule your appointment. If you don't receive this letter, please contact our office. Your physician recommends that you continue on your current medications as directed. Please refer to the Current Medication list given to you today. Your physician has requested that you have a carotid duplex in June 2016 just before your next visit. This test is an ultrasound of the carotid arteries in your neck. It looks at blood flow through these arteries that supply the brain with blood. Allow one hour for this exam. There are no restrictions or special instructions.

## 2014-08-07 NOTE — Assessment & Plan Note (Signed)
She continues on ACE inhibitor and beta blocker. Discussed sodium restriction. Keep follow-up with Dr. Woody Seller.

## 2014-08-09 ENCOUNTER — Ambulatory Visit (HOSPITAL_COMMUNITY)
Admission: RE | Admit: 2014-08-09 | Discharge: 2014-08-09 | Disposition: A | Discharge status: Home / Self Care | Payer: Medicare Other | Source: Ambulatory Visit | Attending: Neurology | Admitting: Neurology

## 2014-08-09 DIAGNOSIS — R4189 Other symptoms and signs involving cognitive functions and awareness: Secondary | ICD-10-CM | POA: Diagnosis not present

## 2014-08-16 ENCOUNTER — Telehealth: Payer: Self-pay | Admitting: Neurology

## 2014-08-16 NOTE — Telephone Encounter (Signed)
Laura Parks - Can you please call patient and reschedule her appointment for December 10th? I am out of the office that day. Somehow she is scheduled. I will need 30 minutes with this patient, a 15 minute follow up won't be enough time. Thank you.   Called, discussed results of MRI. They have neuropsychiatric testing scheduled with Dr. Valentina Shaggy later this month.

## 2014-08-18 NOTE — Telephone Encounter (Signed)
Patient is rescheduled

## 2014-08-24 ENCOUNTER — Ambulatory Visit: Payer: Medicare Other | Admitting: Neurology

## 2014-08-31 ENCOUNTER — Ambulatory Visit (INDEPENDENT_AMBULATORY_CARE_PROVIDER_SITE_OTHER): Payer: Medicare Other | Admitting: Neurology

## 2014-08-31 VITALS — BP 151/64 | HR 55 | Ht 62.0 in | Wt 126.0 lb

## 2014-08-31 DIAGNOSIS — E538 Deficiency of other specified B group vitamins: Secondary | ICD-10-CM

## 2014-08-31 DIAGNOSIS — I6529 Occlusion and stenosis of unspecified carotid artery: Secondary | ICD-10-CM

## 2014-08-31 MED ORDER — CITALOPRAM HYDROBROMIDE 40 MG PO TABS: 40.0000 mg | ORAL_TABLET | Freq: Every day | ORAL | Status: DC

## 2014-08-31 NOTE — Progress Notes (Signed)
[facility]    Provider:  Dr Jaynee Eagles Referring Provider: Glenda Chroman., MD Primary Care Physician:  Glenda Chroman., MD  CC:  Depression vs Dementia  HPI:  Laura Parks is a 80 y.o. female here as a follow up for depression and/or dementia  Interval History 08/31/2014: She is not eating. It is like she is committing suicide per daughter. Daughter is crying. Says mother is committing suicide slowly. Patient is anemic. Patient is dehydrated. She lost 18 pounds in 3 weeks. She will not eat. Her sodium levels are low. She feels hopeless. She is taking antidepressant every day. No interest in anything. She always enjoyed reading and now she can't do that. Patient's husband is worried. Daughter provides most of the information. Reviewed MRI images that showed generalized atrophy congruent with age otherwise unremarkable. B12 194.   Review of Systems: Patient complains of symptoms per HPI as well as the following symptoms:Appetite change, weight loss,  fatigue, loss of vision, easy bruising, cough, murmur, swelling in legs, feeling cold, hearing loss, trouble swallowing, chronic diarrhea, memory loss, confusion, weakness, sleepiness, slurred speech, difficulty swallowing, dizziness, depression, anxiety, too much sleep, decreased energy, change in appetite, disinterest in activity, hallucinations, racing thoughts. Pertinent negatives per HPI. All others negative.  Inital visit 07/25/2014: Laura Parks is a 80 y.o. female here as a referral from Dr. Woody Seller for cognitive and behavioral changes. Daughter provides most of the information. 3 years ago noticed changes in her mother, she was losing her glasses and losing her purse and articles of clothing. It continued and progressed. Patient doesn't want to socially interact, doesn't want to go to church on Sundays and used to love to go dancing and won't go there either. She has stopped wanting to do anything. Daughter and son pay bills. She  doesn't cook anymore, last night she tried and burned the food. She doesn't cook because she leaves burners on. This past summer daughter noticed worsening changes. In October patient became silent and distant, "in her own world". Then on October 19th patient asked daughter to come home and said it was going to be all right, patient thought the police were going to arrest her for brother's death and she is going to prison. Patient has been perseverating on going to jail since then and dieing in prison. Patient reports she did have something to do with her brother's death but doesn't know what she did. She says "I have no joy, no love" in my life. Patient agrees she is having memory problems, forgetting to turn the stove off. She is forgetting to feed the dog recently. Husband is also noticing the changes in memory and doesn't know what to do about it.   She feels sad most days, she has loss of interest in almost everything she used to do. She used to go shopping and doesn't want to do that anymore. She endorses decreased energy, fatigue, irregular sleep, decreased appetite and decreased intake, "I simply do not want anymore to eat". Restless all day.   Daughter also reports her mother is slowing down. Balance is poor, no falls. But has lost her balance.   Mother with alzheimers.   Reviewed notes, labs and imaging from outside physicians, which showed: cbc 11/2013 with anemia. cmp unremarkable. hgba1c 5.6. ldl 63. tsh wnl. Carotid doppler study 02/2014 with evidence of 40-59% bilateral ICA stenosis. Echocardiogram done at [facility] in October 2014 reported normal LV wall thickness with LVEF 60-65%, mild left atrial  enlargement, severe MAC with mildly thickened mitral valve and mild mitral regurgitation, moderately sclerotic aortic valve, mild tricuspid regurgitation. The patient has history of colon carcinoma. A review of notes in [facility] through 07/2012 (mainly cardiology and gastro) don't mention  any apparent cognitive dysfunction. Reviewed primary care notes provided from [facility], last OP note mentions that patient and granddaughter requesting a neuro referral for recent delusions about going to prison, changes in walking, leaving stove on, inability to do serial 7s and draw clock.         History   Social History  . Marital Status: Married    Spouse Name: N/A    Number of Children: N/A  . Years of Education: N/A   Occupational History  . Not on file.   Social History Main Topics  . Smoking status: Never Smoker   . Smokeless tobacco: Never Used  . Alcohol Use: No  . Drug Use: No  . Sexual Activity: Not on file   Other Topics Concern  . Not on file   Social History Narrative    Family History  Problem Relation Age of Onset  . COPD Mother   . Alzheimer's disease Mother   . Congestive Heart Failure Mother   . Diabetes Brother     Past Medical History  Diagnosis Date  . Type 2 diabetes mellitus   . Colon cancer   . Anemia   . Essential hypertension, benign   . Mixed hyperlipidemia   . Macular degeneration   . History of pneumonia   . Iron deficiency anemia     Erosive antral gastritis  . Cognitive impairment     Past Surgical History  Procedure Laterality Date  . Cataract extraction    . Cholecystectomy    . Appendectomy    . Right knee surgery    . Colon surgery for colon cancer      1992 by Dr Mendel Ryder  . Tubal ligation    . Colonoscopy  03/24/2012    Procedure: COLONOSCOPY;  Surgeon: Rogene Houston, MD;  [facility];  Service: Endoscopy;  Laterality: N/A;  1200  . Esophagogastroduodenoscopy  08/04/2012    Procedure: ESOPHAGOGASTRODUODENOSCOPY (EGD);  Surgeon: Rogene Houston, MD;  [facility]: [facility];  Service: Endoscopy;  Laterality: N/A;  325    Current Outpatient Prescriptions  Medication Sig Dispense Refill  . alendronate (FOSAMAX) 70 MG tablet Take 70 mg by mouth every 7 (seven) days. Take with a full glass of  water on an empty stomach.    Marland Kitchen atorvastatin (LIPITOR) 10 MG tablet Take 10 mg by mouth daily.    . cholestyramine (QUESTRAN) 4 G packet Take 4 g by mouth 2 (two) times daily.     . clonazePAM (KLONOPIN) 0.5 MG tablet Take 0.5 tablets (0.25 mg total) by mouth 2 (two) times daily as needed for anxiety. (Patient taking differently: Take 0.25 mg by mouth as needed for anxiety. ) 15 tablet 1  . Cyanocobalamin (VITAMIN B-12) 2500 MCG SUBL Place 2,500 mcg under the tongue.    . ergocalciferol (VITAMIN D2) 50000 UNITS capsule Take 50,000 Units by mouth once a week. On Sundays    . ferrous sulfate 325 (65 FE) MG tablet Take 325 mg by mouth daily with breakfast.    . FLUoxetine (PROZAC) 20 MG capsule Take 1 capsule (20 mg total) by mouth daily. 30 capsule 3  . furosemide (LASIX) 20 MG tablet Take 20 mg by mouth as needed.     Marland Kitchen  lisinopril (PRINIVIL,ZESTRIL) 40 MG tablet Take 40 mg by mouth daily.    . metFORMIN (GLUCOPHAGE) 500 MG tablet Take 1,000 mg by mouth 2 (two) times daily.     . metoprolol succinate (TOPROL-XL) 50 MG 24 hr tablet Take 50 mg by mouth daily. Take with or immediately following a meal.    . pioglitazone (ACTOS) 30 MG tablet Take 15 mg by mouth daily.      No current facility-administered medications for this visit.    Allergies as of 08/31/2014 - Review Complete 08/31/2014  Allergen Reaction Noted  . Codeine Nausea Only 02/23/2012    Vitals: BP 151/64 mmHg  Pulse 55  Ht 5\' 2"  (1.575 m)  Wt 126 lb (57.153 kg)  BMI 23.04 kg/m2 Last Weight:  Wt Readings from Last 1 Encounters:  08/31/14 126 lb (57.153 kg)   Last Height:   Ht Readings from Last 1 Encounters:  08/31/14 5\' 2"  (1.575 m)   Physical exam: Exam: Gen: NAD, conversant, well nourised, obese, well groomed  CV: RRR, mild SEM. No Carotid Bruits. No peripheral edema, warm, nontender Eyes: Conjunctivae clear without exudates or hemorrhage  Neuro: Detailed Neurologic Exam:  Speech:  Speech  is normal; fluent and spontaneous with normal comprehension.    Cognition: Today MoCA 17/30. Last MoCA 15/30  The patient is oriented to person, place, and time;   recent memory impaired, remote memory intact;   language fluent;   Impaired attention, concentration.  Cranial Nerves:   The pupils are equal, round, and reactive to light. Deceased vision (legally blind due to macular degeneration). The fundi are flat. Visual fields are full to finger waving. Extraocular movements are intact. Trigeminal sensation is intact and the muscles of mastication are normal. The face is symmetric. The palate elevates in the midline. Voice is normal. Shoulder shrug is normal. The tongue has normal motion without fasciculations.   Coordination:  Normal finger to nose. Difficulty heel to shin due to body habitus. Slowed rapid alternating movements bilat.  Gait:  Mildly wide-based but small stride, decreased arm swing bilat, en-bloc turning.  Motor Observation:   Masked facies. No tremor. Tone:  Increased tone with facilitation in the upper extremities  Posture:  Posture is normal.    Strength:  Strength is intact in the upper and lower limbs.    Sensation:   Intact light touch Reflex Exam:  DTR's:  Absent achilles otherwise deep tendon reflexes in the upper and lower extremities are normal bilaterally.  Toes:  Right down. Left mute. Clonus:  Clonus is absent.  Other: No frontal release signs.       Assessment/Plan: 80 year old female with 3 years of slowly progressive memory changes with recent acute worsening including delusions that the police are going to arrest her for the murder of her brother. She endorses significant depression, "I have no joy, no love". Today's MoCA 17/30. Last MoCA 15/30. Possibly multifactorial could have a baseline dementing process with exacerbation secondary to depression. Exam demonstrates some parkinsonian  features. Delusions/hallucinations can be a prominent feature early in lewy-body dementia and later in alzheimer's type dementia. Can also have delusions in association with severe depression.   MRi of the brain was unremarkable, showed generalized atrophy. B12 was 194 and now taking oral supplements.   Will change prozac to celexa (prozac has a long half life, celexa better tolerated in the elderly). Will increase dose. Called and got patient an appointment with a psychiatrist. Aftrer today's visit, think this is likely pseudodementia  due to depression but has neuropsychiatric testing pending. Patient is taking B12 supplements and following with her pcp. Needs B12 recheck in a month to ensure B12 levels have improved.  Sarina Ill, MD  [facility] [address] [place], [place] [address]  Phone [phone] Fax [phone]

## 2014-08-31 NOTE — Patient Instructions (Signed)
Overall you are doing fairly well but I do want to suggest a few things today:   Remember to drink plenty of fluid, eat healthy meals and do not skip any meals. Try to eat protein with a every meal and eat a healthy snack such as fruit or nuts in between meals. Try to keep a regular sleep-wake schedule and try to exercise daily, particularly in the form of walking, 20-30 minutes a day, if you can.   As far as your medications are concerned, I would like to suggest: Stop taking Fluoxetine. Start taking Citalopram 40mg  daily.   I would like to see you back in 3 month, sooner if we need to. Please call us with any interim questions, concerns, problems, updates or refill requests.   Please also call us for any test results so we can go over those with you on the phone.  My clinical assistant and will answer any of your questions and relay your messages to me and also relay most of my messages to you.   Our phone number is [phone]. We also have an after hours call service for urgent matters and there is a physician on-call for urgent questions. For any emergencies you know to call 911 or go to the nearest emergency room

## 2014-09-02 ENCOUNTER — Encounter: Payer: Self-pay | Admitting: Neurology

## 2014-09-02 DIAGNOSIS — E538 Deficiency of other specified B group vitamins: Secondary | ICD-10-CM | POA: Insufficient documentation

## 2014-09-05 DIAGNOSIS — R413 Other amnesia: Secondary | ICD-10-CM

## 2014-09-05 DIAGNOSIS — F329 Major depressive disorder, single episode, unspecified: Secondary | ICD-10-CM

## 2014-09-06 ENCOUNTER — Encounter (INDEPENDENT_AMBULATORY_CARE_PROVIDER_SITE_OTHER): Payer: Self-pay | Admitting: *Deleted

## 2014-09-20 ENCOUNTER — Ambulatory Visit (HOSPITAL_COMMUNITY): Payer: Medicare Other | Admitting: Physician Assistant

## 2014-10-04 ENCOUNTER — Telehealth: Payer: Self-pay | Admitting: Neurology

## 2014-10-04 NOTE — Telephone Encounter (Signed)
Spoke to patient's daughter. Discussed the neuropsychiatric testing confirming that most of her symptoms are likely due to pseudodementia secondary to severe depression. The celexa is helping. I still recommend following up with psychiatry for medical management. Can folllow up with me as well to evaluate any underlying neurodegenerative process as well.

## 2014-11-02 ENCOUNTER — Other Ambulatory Visit (INDEPENDENT_AMBULATORY_CARE_PROVIDER_SITE_OTHER): Payer: Self-pay | Admitting: *Deleted

## 2014-11-02 ENCOUNTER — Encounter (INDEPENDENT_AMBULATORY_CARE_PROVIDER_SITE_OTHER): Payer: Self-pay | Admitting: *Deleted

## 2014-11-02 DIAGNOSIS — D509 Iron deficiency anemia, unspecified: Secondary | ICD-10-CM

## 2015-02-06 ENCOUNTER — Ambulatory Visit (INDEPENDENT_AMBULATORY_CARE_PROVIDER_SITE_OTHER): Payer: Medicare Other | Admitting: Internal Medicine

## 2015-03-12 ENCOUNTER — Other Ambulatory Visit: Payer: Self-pay

## 2015-04-03 ENCOUNTER — Encounter (INDEPENDENT_AMBULATORY_CARE_PROVIDER_SITE_OTHER): Payer: Self-pay | Admitting: *Deleted

## 2015-06-07 ENCOUNTER — Telehealth: Payer: Self-pay | Admitting: Cardiology

## 2015-06-07 NOTE — Telephone Encounter (Signed)
Laura Parks wants to wait until she sees Dr. Domenic Polite on her visit of October before scheduling any test.

## 2015-07-16 ENCOUNTER — Encounter: Payer: Self-pay | Admitting: Cardiology

## 2015-07-16 ENCOUNTER — Encounter: Payer: Medicare Other | Admitting: Cardiology

## 2015-07-16 NOTE — Progress Notes (Signed)
No show  This encounter was created in error - please disregard.

## 2015-10-04 DIAGNOSIS — E7800 Pure hypercholesterolemia, unspecified: Secondary | ICD-10-CM | POA: Diagnosis not present

## 2015-10-04 DIAGNOSIS — I10 Essential (primary) hypertension: Secondary | ICD-10-CM | POA: Diagnosis not present

## 2015-10-04 DIAGNOSIS — N184 Chronic kidney disease, stage 4 (severe): Secondary | ICD-10-CM | POA: Diagnosis not present

## 2015-10-04 DIAGNOSIS — Z6825 Body mass index (BMI) 25.0-25.9, adult: Secondary | ICD-10-CM | POA: Diagnosis not present

## 2015-10-04 DIAGNOSIS — E1129 Type 2 diabetes mellitus with other diabetic kidney complication: Secondary | ICD-10-CM | POA: Diagnosis not present

## 2015-10-16 DIAGNOSIS — E113593 Type 2 diabetes mellitus with proliferative diabetic retinopathy without macular edema, bilateral: Secondary | ICD-10-CM | POA: Diagnosis not present

## 2015-10-16 DIAGNOSIS — H35363 Drusen (degenerative) of macula, bilateral: Secondary | ICD-10-CM | POA: Diagnosis not present

## 2015-10-16 DIAGNOSIS — H31009 Unspecified chorioretinal scars, unspecified eye: Secondary | ICD-10-CM | POA: Diagnosis not present

## 2015-10-16 DIAGNOSIS — H353134 Nonexudative age-related macular degeneration, bilateral, advanced atrophic with subfoveal involvement: Secondary | ICD-10-CM | POA: Diagnosis not present

## 2015-11-06 DIAGNOSIS — R001 Bradycardia, unspecified: Secondary | ICD-10-CM | POA: Diagnosis not present

## 2015-11-06 DIAGNOSIS — S61411S Laceration without foreign body of right hand, sequela: Secondary | ICD-10-CM | POA: Diagnosis not present

## 2015-11-13 DIAGNOSIS — M79641 Pain in right hand: Secondary | ICD-10-CM | POA: Diagnosis not present

## 2015-11-13 DIAGNOSIS — S61411A Laceration without foreign body of right hand, initial encounter: Secondary | ICD-10-CM | POA: Diagnosis not present

## 2015-11-13 DIAGNOSIS — W228XXA Striking against or struck by other objects, initial encounter: Secondary | ICD-10-CM | POA: Diagnosis not present

## 2015-11-19 DIAGNOSIS — L110 Acquired keratosis follicularis: Secondary | ICD-10-CM | POA: Diagnosis not present

## 2015-11-19 DIAGNOSIS — L609 Nail disorder, unspecified: Secondary | ICD-10-CM | POA: Diagnosis not present

## 2015-11-19 DIAGNOSIS — E1151 Type 2 diabetes mellitus with diabetic peripheral angiopathy without gangrene: Secondary | ICD-10-CM | POA: Diagnosis not present

## 2015-12-26 ENCOUNTER — Emergency Department (HOSPITAL_COMMUNITY): Payer: Medicare Other

## 2015-12-26 ENCOUNTER — Encounter (HOSPITAL_COMMUNITY): Payer: Self-pay

## 2015-12-26 ENCOUNTER — Inpatient Hospital Stay (HOSPITAL_COMMUNITY)
Admission: EM | Admit: 2015-12-26 | Discharge: 2016-01-06 | DRG: 064 | Disposition: A | Discharge status: Home Health | Payer: Medicare Other | Attending: Internal Medicine | Admitting: Internal Medicine

## 2015-12-26 DIAGNOSIS — R339 Retention of urine, unspecified: Secondary | ICD-10-CM | POA: Diagnosis not present

## 2015-12-26 DIAGNOSIS — I480 Paroxysmal atrial fibrillation: Secondary | ICD-10-CM | POA: Diagnosis present

## 2015-12-26 DIAGNOSIS — H3530 Unspecified macular degeneration: Secondary | ICD-10-CM | POA: Diagnosis present

## 2015-12-26 DIAGNOSIS — N170 Acute kidney failure with tubular necrosis: Secondary | ICD-10-CM | POA: Diagnosis not present

## 2015-12-26 DIAGNOSIS — I214 Non-ST elevation (NSTEMI) myocardial infarction: Secondary | ICD-10-CM | POA: Diagnosis not present

## 2015-12-26 DIAGNOSIS — G459 Transient cerebral ischemic attack, unspecified: Secondary | ICD-10-CM | POA: Diagnosis not present

## 2015-12-26 DIAGNOSIS — Z8701 Personal history of pneumonia (recurrent): Secondary | ICD-10-CM

## 2015-12-26 DIAGNOSIS — G9340 Encephalopathy, unspecified: Secondary | ICD-10-CM | POA: Diagnosis present

## 2015-12-26 DIAGNOSIS — Z9841 Cataract extraction status, right eye: Secondary | ICD-10-CM

## 2015-12-26 DIAGNOSIS — E872 Acidosis: Secondary | ICD-10-CM | POA: Diagnosis present

## 2015-12-26 DIAGNOSIS — Z9842 Cataract extraction status, left eye: Secondary | ICD-10-CM

## 2015-12-26 DIAGNOSIS — Z515 Encounter for palliative care: Secondary | ICD-10-CM | POA: Diagnosis not present

## 2015-12-26 DIAGNOSIS — R400 Somnolence: Secondary | ICD-10-CM

## 2015-12-26 DIAGNOSIS — R4182 Altered mental status, unspecified: Secondary | ICD-10-CM | POA: Diagnosis not present

## 2015-12-26 DIAGNOSIS — E782 Mixed hyperlipidemia: Secondary | ICD-10-CM | POA: Diagnosis present

## 2015-12-26 DIAGNOSIS — I110 Hypertensive heart disease with heart failure: Secondary | ICD-10-CM | POA: Diagnosis present

## 2015-12-26 DIAGNOSIS — R7989 Other specified abnormal findings of blood chemistry: Secondary | ICD-10-CM

## 2015-12-26 DIAGNOSIS — I639 Cerebral infarction, unspecified: Secondary | ICD-10-CM | POA: Diagnosis not present

## 2015-12-26 DIAGNOSIS — R29810 Facial weakness: Secondary | ICD-10-CM | POA: Diagnosis not present

## 2015-12-26 DIAGNOSIS — I4510 Unspecified right bundle-branch block: Secondary | ICD-10-CM | POA: Diagnosis present

## 2015-12-26 DIAGNOSIS — Z825 Family history of asthma and other chronic lower respiratory diseases: Secondary | ICD-10-CM

## 2015-12-26 DIAGNOSIS — R159 Full incontinence of feces: Secondary | ICD-10-CM | POA: Diagnosis present

## 2015-12-26 DIAGNOSIS — Z85038 Personal history of other malignant neoplasm of large intestine: Secondary | ICD-10-CM

## 2015-12-26 DIAGNOSIS — R402212 Coma scale, best verbal response, none, at arrival to emergency department: Secondary | ICD-10-CM | POA: Diagnosis not present

## 2015-12-26 DIAGNOSIS — N179 Acute kidney failure, unspecified: Secondary | ICD-10-CM | POA: Diagnosis present

## 2015-12-26 DIAGNOSIS — E119 Type 2 diabetes mellitus without complications: Secondary | ICD-10-CM

## 2015-12-26 DIAGNOSIS — R0989 Other specified symptoms and signs involving the circulatory and respiratory systems: Secondary | ICD-10-CM

## 2015-12-26 DIAGNOSIS — R402352 Coma scale, best motor response, localizes pain, at arrival to emergency department: Secondary | ICD-10-CM | POA: Diagnosis present

## 2015-12-26 DIAGNOSIS — R1110 Vomiting, unspecified: Secondary | ICD-10-CM | POA: Diagnosis not present

## 2015-12-26 DIAGNOSIS — I6789 Other cerebrovascular disease: Secondary | ICD-10-CM | POA: Diagnosis not present

## 2015-12-26 DIAGNOSIS — Z820 Family history of epilepsy and other diseases of the nervous system: Secondary | ICD-10-CM

## 2015-12-26 DIAGNOSIS — D72829 Elevated white blood cell count, unspecified: Secondary | ICD-10-CM | POA: Diagnosis present

## 2015-12-26 DIAGNOSIS — I10 Essential (primary) hypertension: Secondary | ICD-10-CM | POA: Diagnosis present

## 2015-12-26 DIAGNOSIS — R000 Tachycardia, unspecified: Secondary | ICD-10-CM | POA: Diagnosis present

## 2015-12-26 DIAGNOSIS — E860 Dehydration: Secondary | ICD-10-CM | POA: Diagnosis present

## 2015-12-26 DIAGNOSIS — I5040 Unspecified combined systolic (congestive) and diastolic (congestive) heart failure: Secondary | ICD-10-CM | POA: Diagnosis present

## 2015-12-26 DIAGNOSIS — H548 Legal blindness, as defined in USA: Secondary | ICD-10-CM | POA: Diagnosis present

## 2015-12-26 DIAGNOSIS — Z8249 Family history of ischemic heart disease and other diseases of the circulatory system: Secondary | ICD-10-CM

## 2015-12-26 DIAGNOSIS — I509 Heart failure, unspecified: Secondary | ICD-10-CM

## 2015-12-26 DIAGNOSIS — R778 Other specified abnormalities of plasma proteins: Secondary | ICD-10-CM

## 2015-12-26 DIAGNOSIS — R4781 Slurred speech: Secondary | ICD-10-CM | POA: Diagnosis not present

## 2015-12-26 DIAGNOSIS — E875 Hyperkalemia: Secondary | ICD-10-CM | POA: Diagnosis present

## 2015-12-26 DIAGNOSIS — R402142 Coma scale, eyes open, spontaneous, at arrival to emergency department: Secondary | ICD-10-CM | POA: Diagnosis present

## 2015-12-26 DIAGNOSIS — R410 Disorientation, unspecified: Secondary | ICD-10-CM | POA: Diagnosis not present

## 2015-12-26 DIAGNOSIS — I248 Other forms of acute ischemic heart disease: Secondary | ICD-10-CM | POA: Diagnosis present

## 2015-12-26 DIAGNOSIS — R0602 Shortness of breath: Secondary | ICD-10-CM | POA: Diagnosis not present

## 2015-12-26 DIAGNOSIS — F0150 Vascular dementia without behavioral disturbance: Secondary | ICD-10-CM | POA: Diagnosis present

## 2015-12-26 DIAGNOSIS — D696 Thrombocytopenia, unspecified: Secondary | ICD-10-CM | POA: Diagnosis present

## 2015-12-26 DIAGNOSIS — Z833 Family history of diabetes mellitus: Secondary | ICD-10-CM

## 2015-12-26 DIAGNOSIS — I2510 Atherosclerotic heart disease of native coronary artery without angina pectoris: Secondary | ICD-10-CM | POA: Diagnosis present

## 2015-12-26 LAB — CBC
HCT: 35.5 % — ABNORMAL LOW (ref 36.0–46.0)
HEMOGLOBIN: 11.8 g/dL — AB (ref 12.0–15.0)
MCH: 28.7 pg (ref 26.0–34.0)
MCHC: 33.2 g/dL (ref 30.0–36.0)
MCV: 86.4 fL (ref 78.0–100.0)
Platelets: 147 10*3/uL — ABNORMAL LOW (ref 150–400)
RBC: 4.11 MIL/uL (ref 3.87–5.11)
RDW: 14.2 % (ref 11.5–15.5)
WBC: 9.9 10*3/uL (ref 4.0–10.5)

## 2015-12-26 LAB — COMPREHENSIVE METABOLIC PANEL
ALBUMIN: 4 g/dL (ref 3.5–5.0)
ALBUMIN: 3.6 g/dL (ref 3.5–5.0)
ALK PHOS: 84 U/L (ref 38–126)
ALK PHOS: 80 U/L (ref 38–126)
ALT: 16 U/L (ref 14–54)
ALT: 14 U/L (ref 14–54)
ANION GAP: 13 (ref 5–15)
ANION GAP: 13 (ref 5–15)
AST: 27 U/L (ref 15–41)
AST: 23 U/L (ref 15–41)
BILIRUBIN TOTAL: 0.9 mg/dL (ref 0.3–1.2)
BILIRUBIN TOTAL: 0.8 mg/dL (ref 0.3–1.2)
BUN: 21 mg/dL — AB (ref 6–20)
BUN: 23 mg/dL — ABNORMAL HIGH (ref 6–20)
CALCIUM: 9.3 mg/dL (ref 8.9–10.3)
CALCIUM: 8.9 mg/dL (ref 8.9–10.3)
CO2: 21 mmol/L — AB (ref 22–32)
CO2: 20 mmol/L — AB (ref 22–32)
CREATININE: 1.37 mg/dL — AB (ref 0.44–1.00)
Chloride: 106 mmol/L (ref 101–111)
Chloride: 107 mmol/L (ref 101–111)
Creatinine, Ser: 1.39 mg/dL — ABNORMAL HIGH (ref 0.44–1.00)
GFR calc Af Amer: 40 mL/min — ABNORMAL LOW (ref >60)
GFR calc non Af Amer: 35 mL/min — ABNORMAL LOW (ref >60)
GFR calc non Af Amer: 34 mL/min — ABNORMAL LOW (ref >60)
GFR, EST AFRICAN AMERICAN: 39 mL/min — AB (ref >60)
GLUCOSE: 184 mg/dL — AB (ref 65–99)
Glucose, Bld: 231 mg/dL — ABNORMAL HIGH (ref 65–99)
POTASSIUM: 5 mmol/L (ref 3.5–5.1)
Potassium: 5.3 mmol/L — ABNORMAL HIGH (ref 3.5–5.1)
Sodium: 140 mmol/L (ref 135–145)
Sodium: 140 mmol/L (ref 135–145)
TOTAL PROTEIN: 6.9 g/dL (ref 6.5–8.1)
TOTAL PROTEIN: 6.5 g/dL (ref 6.5–8.1)

## 2015-12-26 LAB — URINALYSIS, ROUTINE W REFLEX MICROSCOPIC
Bilirubin Urine: NEGATIVE
GLUCOSE, UA: NEGATIVE mg/dL
KETONES UR: NEGATIVE mg/dL
LEUKOCYTES UA: NEGATIVE
NITRITE: NEGATIVE
PH: 5.5 (ref 5.0–8.0)
Protein, ur: >300 mg/dL — AB
Specific Gravity, Urine: 1.019 (ref 1.005–1.030)

## 2015-12-26 LAB — DIFFERENTIAL
Basophils Absolute: 0 10*3/uL (ref 0.0–0.1)
Basophils Relative: 0 %
EOS PCT: 1 %
Eosinophils Absolute: 0.1 10*3/uL (ref 0.0–0.7)
LYMPHS ABS: 4.1 10*3/uL — AB (ref 0.7–4.0)
LYMPHS PCT: 41 %
Monocytes Absolute: 0.9 10*3/uL (ref 0.1–1.0)
Monocytes Relative: 9 %
NEUTROS ABS: 4.8 10*3/uL (ref 1.7–7.7)
NEUTROS PCT: 49 %

## 2015-12-26 LAB — URINE MICROSCOPIC-ADD ON: WBC UA: NONE SEEN WBC/hpf (ref 0–5)

## 2015-12-26 LAB — I-STAT CHEM 8, ED
BUN: 33 mg/dL — AB (ref 6–20)
CALCIUM ION: 1.05 mmol/L — AB (ref 1.13–1.30)
CHLORIDE: 107 mmol/L (ref 101–111)
CREATININE: 1.2 mg/dL — AB (ref 0.44–1.00)
GLUCOSE: 168 mg/dL — AB (ref 65–99)
HCT: 38 % (ref 36.0–46.0)
Hemoglobin: 12.9 g/dL (ref 12.0–15.0)
POTASSIUM: 5.2 mmol/L — AB (ref 3.5–5.1)
Sodium: 139 mmol/L (ref 135–145)
TCO2: 23 mmol/L (ref 0–100)

## 2015-12-26 LAB — I-STAT CG4 LACTIC ACID, ED: Lactic Acid, Venous: 2.04 mmol/L (ref 0.5–2.0)

## 2015-12-26 LAB — I-STAT TROPONIN, ED: Troponin i, poc: 0.05 ng/mL (ref 0.00–0.08)

## 2015-12-26 LAB — PROTIME-INR
INR: 0.94 (ref 0.00–1.49)
Prothrombin Time: 12.7 seconds (ref 11.6–15.2)

## 2015-12-26 LAB — APTT: aPTT: 21 seconds — ABNORMAL LOW (ref 24–37)

## 2015-12-26 LAB — CBG MONITORING, ED: GLUCOSE-CAPILLARY: 154 mg/dL — AB (ref 65–99)

## 2015-12-26 MED ORDER — LORAZEPAM 2 MG/ML IJ SOLN
0.5000 mg | Freq: Once | INTRAMUSCULAR | Status: AC
  Administered 2015-12-27: 0.5 mg via INTRAVENOUS
  Filled 2015-12-26: qty 1

## 2015-12-26 MED ORDER — SODIUM CHLORIDE 0.9 % IV BOLUS (SEPSIS): 1000.0000 mL | Freq: Once | INTRAVENOUS | Status: DC

## 2015-12-26 MED ORDER — SODIUM CHLORIDE 0.9 % IV BOLUS (SEPSIS)
500.0000 mL | Freq: Once | INTRAVENOUS | Status: AC
  Administered 2015-12-26: 500 mL via INTRAVENOUS

## 2015-12-26 MED ORDER — METOCLOPRAMIDE HCL 5 MG/ML IJ SOLN
10.0000 mg | Freq: Once | INTRAMUSCULAR | Status: AC
  Administered 2015-12-26: 10 mg via INTRAVENOUS
  Filled 2015-12-26: qty 2

## 2015-12-26 NOTE — ED Provider Notes (Signed)
CSN: [id number]     Arrival date & time 12/26/15  2127 History   First MD Initiated Contact with Patient 12/26/15 2131     Chief Complaint  Patient presents with  . Code Stroke     (Consider location/radiation/quality/duration/timing/severity/associated sxs/prior Treatment) HPI 82 y.o. female with a history of diabetes, macular degeneration, HTN, HLD, presents to the emergency department as a code stroke by EMS after she was found down by her family after a reported fall around 6:00PM. When she was found she was significantly altered, nonverbal, moving all extremities equally. EKG was done in the field and showed sinus tach with right bundle branch block and LVH with secondary repolarization abnormality which appeared similar to previous. She began vomiting while in route to the ED, given zofran by EMS. On arrival, she is significantly altered, nonverbal, and acting significantly different from her reported baseline.  History is limited due to altered mental status. On further history when family arrived she was said to be speaking incoherently initially then becoming nonverbal while en route.   Past Medical History  Diagnosis Date  . Type 2 diabetes mellitus ([place])   . Colon cancer ([place])   . Anemia   . Essential hypertension, benign   . Mixed hyperlipidemia   . Macular degeneration   . History of pneumonia   . Iron deficiency anemia     Erosive antral gastritis  . Cognitive impairment    Past Surgical History  Procedure Laterality Date  . Cataract extraction    . Cholecystectomy    . Appendectomy    . Right knee surgery    . Colon surgery for colon cancer      1992 by Dr Mendel Ryder  . Tubal ligation    . Colonoscopy  03/24/2012    Procedure: COLONOSCOPY;  Surgeon: Rogene Houston, MD;  [facility];  Service: Endoscopy;  Laterality: N/A;  1200  . Esophagogastroduodenoscopy  08/04/2012    Procedure: ESOPHAGOGASTRODUODENOSCOPY (EGD);  Surgeon: Rogene Houston, MD;   [facility];  Service: Endoscopy;  Laterality: N/A;  325   Family History  Problem Relation Age of Onset  . COPD Mother   . Alzheimer's disease Mother   . Congestive Heart Failure Mother   . Diabetes Brother    Social History  Substance Use Topics  . Smoking status: Never Smoker   . Smokeless tobacco: Never Used  . Alcohol Use: No   OB History    No data available     Review of Systems  Unable to perform ROS: Mental status change      Allergies  Codeine  Home Medications   Prior to Admission medications   Medication Sig Start Date End Date Taking? Authorizing Provider  alendronate (FOSAMAX) 70 MG tablet Take 70 mg by mouth every 7 (seven) days. Take with a full glass of water on an empty stomach.    Historical Provider, MD  atorvastatin (LIPITOR) 10 MG tablet Take 10 mg by mouth daily.    Historical Provider, MD  cholestyramine Lucrezia Starch) 4 G packet Take 4 g by mouth 2 (two) times daily.     Historical Provider, MD  citalopram (CELEXA) 40 MG tablet Take 1 tablet (40 mg total) by mouth daily. 08/31/14   Melvenia Beam, MD  clonazePAM (KLONOPIN) 0.5 MG tablet Take 0.5 tablets (0.25 mg total) by mouth 2 (two) times daily as needed for anxiety. Patient taking differently: Take 0.25 mg by mouth as needed for anxiety.  07/25/14   Melvenia Beam, MD  Cyanocobalamin (VITAMIN B-12) 2500 MCG SUBL Place 2,500 mcg under the tongue.    Historical Provider, MD  ergocalciferol (VITAMIN D2) 50000 UNITS capsule Take 50,000 Units by mouth once a week. On Sundays    Historical Provider, MD  ferrous sulfate 325 (65 FE) MG tablet Take 325 mg by mouth daily with breakfast.    Historical Provider, MD  furosemide (LASIX) 20 MG tablet Take 20 mg by mouth as needed.     Historical Provider, MD  lisinopril (PRINIVIL,ZESTRIL) 40 MG tablet Take 40 mg by mouth daily.    Historical Provider, MD  metFORMIN (GLUCOPHAGE) 500 MG tablet Take 1,000 mg by mouth 2 (two) times daily.     Historical  Provider, MD  metoprolol succinate (TOPROL-XL) 50 MG 24 hr tablet Take 50 mg by mouth daily. Take with or immediately following a meal.    Historical Provider, MD  pioglitazone (ACTOS) 30 MG tablet Take 15 mg by mouth daily.     Historical Provider, MD   BP 135/113 mmHg  Pulse 97  Temp(Src) 98.2 F (36.8 C) (Rectal)  Resp 21  Wt 58.968 kg  SpO2 99% Physical Exam  Constitutional: She appears well-developed and well-nourished. No distress.  HENT:  Head: Normocephalic and atraumatic.  Nose: Nose normal.  Mouth/Throat: Oropharynx is clear and moist.  Eyes: Conjunctivae and EOM are normal.  Dilated, minimally reactive pupils b/l likely due to underlying macular degeneration.   Neck: Neck supple.  Cardiovascular: Normal rate, regular rhythm, normal heart sounds and intact distal pulses.   Pulmonary/Chest: Effort normal and breath sounds normal.  Abdominal: Soft. She exhibits no distension. There is no tenderness.  Musculoskeletal: She exhibits no edema or tenderness.  Neurological: She is alert. GCS eye subscore is 4. GCS verbal subscore is 1. GCS motor subscore is 5.  No verbal response. MAE equally.   Skin: Skin is warm and dry. She is not diaphoretic.  Nursing note and vitals reviewed.   ED Course  Procedures (including critical care time) Labs Review Labs Reviewed  APTT - Abnormal; Notable for the following:    aPTT 21 (*)    All other components within normal limits  CBC - Abnormal; Notable for the following:    Hemoglobin 11.8 (*)    HCT 35.5 (*)    Platelets 147 (*)    All other components within normal limits  DIFFERENTIAL - Abnormal; Notable for the following:    Lymphs Abs 4.1 (*)    All other components within normal limits  COMPREHENSIVE METABOLIC PANEL - Abnormal; Notable for the following:    Potassium 5.3 (*)    CO2 21 (*)    Glucose, Bld 184 (*)    BUN 21 (*)    Creatinine, Ser 1.37 (*)    GFR calc non Af Amer 35 (*)    GFR calc Af Amer 40 (*)    All  other components within normal limits  URINALYSIS, ROUTINE W REFLEX MICROSCOPIC (NOT AT [facility]) - Abnormal; Notable for the following:    Hgb urine dipstick TRACE (*)    Protein, ur >300 (*)    All other components within normal limits  COMPREHENSIVE METABOLIC PANEL - Abnormal; Notable for the following:    CO2 20 (*)    Glucose, Bld 231 (*)    BUN 23 (*)    Creatinine, Ser 1.39 (*)    GFR calc non Af Amer 34 (*)    GFR calc Af  Amer 39 (*)    All other components within normal limits  URINE MICROSCOPIC-ADD ON - Abnormal; Notable for the following:    Squamous Epithelial / LPF 0-5 (*)    Bacteria, UA RARE (*)    Casts HYALINE CASTS (*)    All other components within normal limits  CBG MONITORING, ED - Abnormal; Notable for the following:    Glucose-Capillary 154 (*)    All other components within normal limits  I-STAT CHEM 8, ED - Abnormal; Notable for the following:    Potassium 5.2 (*)    BUN 33 (*)    Creatinine, Ser 1.20 (*)    Glucose, Bld 168 (*)    Calcium, Ion 1.05 (*)    All other components within normal limits  I-STAT CG4 LACTIC ACID, ED - Abnormal; Notable for the following:    Lactic Acid, Venous 2.04 (*)    All other components within normal limits  URINE CULTURE  PROTIME-INR  I-STAT TROPOININ, ED    Imaging Review Ct Head Wo Contrast  12/26/2015  ADDENDUM REPORT: 12/26/2015 22:37 ADDENDUM: Critical Value/emergent results were called by telephone at the time of interpretation on 12/26/2015 at 10:30 pm to Dr. Nicole Kindred, neurology, who verbally acknowledged these results. Electronically Signed   By: Lowella Grip III M.D.   On: 12/26/2015 22:37  12/26/2015  CLINICAL DATA:  Confusion and agitation EXAM: CT HEAD WITHOUT CONTRAST TECHNIQUE: Contiguous axial images were obtained from the base of the skull through the vertex without intravenous contrast. COMPARISON:  Brain MRI August 09, 2014 FINDINGS: Motion artifact makes this study less than optimal. There is moderate  diffuse atrophy. There is no intracranial mass, hemorrhage, extra-axial fluid collection, or midline shift. There is patchy small vessel disease in the centra semiovale bilaterally. No acute infarct is evident. The middle cerebral artery attenuation is symmetric bilaterally. IMPRESSION: Suboptimal study due to motion artifact. Atrophy with patchy periventricular small vessel disease. No acute infarct is evident. No hemorrhage or mass effect. Note that subtle early infarct could be obscured given this degree of motion artifact, particularly in the posterior fossa region. Electronically Signed: By: Lowella Grip III M.D. On: 12/26/2015 21:59   I have personally reviewed and evaluated these images and lab results as part of my medical decision-making.   EKG Interpretation   Date/Time:  Wednesday December 26 2015 22:04:24 EDT Ventricular Rate:  100 PR Interval:  44 QRS Duration: 133 QT Interval:  400 QTC Calculation: 516 R Axis:   -80 Text Interpretation:  Sinus tachycardia Right bundle branch block LVH with  secondary repolarization abnormality Inferior infarct, acute (RCA) Lateral  leads are also involved Probable RV involvement, suggest recording right  precordial leads tachycardia new from previous, RBBB present on previous  EKG Confirmed by LITTLE MD, RACHEL [phone]) on 12/26/2015 11:04:22 PM      MDM  82 y.o. female with a history of macular degeneration, DM, HTN, presents to the emergency department as a code stroke due to significant altered mental status, found down at home by family members at 61 PM. At baseline the patient is unable to care for self and communicate normally. En route to the hospital she began having nonbloody nausea and vomiting. On arrival the patient was found to be significant altered with a GCS of 10, no verbal response, but moving all extremities equally, localizing to pain. Otherwise physical exam relatively unremarkable, with no significant evidence of trauma. EKG  was done and again showed right bundle-branch block with LVH  and secondary repolarization abnormalities similar to previous. Code stroke CT head was performed and showed no acute infarct evident by study was suboptimal due to motion artifact. Labs are drawn and returned significant for a KI with creatinine at 1.39, BUN at 23 lactic acidosis of 2.04, CO2 20. UA shows no evidence of infection. Negative troponin, no leukocytosis. Chest x-ray was done and showed evidence of CHF with interstitial edema. She had persistent vomiting in the ED, given her significant altered mental status the decision was made to get a CT scan of her abdomen as well to further evaluate. The patient was then admitted to the hospitalist for further evaluation and care.      Final diagnoses:  Altered mental status       Zenovia Jarred, DO 12/27/15 [place], MD 12/27/15 [phone]

## 2015-12-26 NOTE — ED Notes (Signed)
Code Stroke

## 2015-12-26 NOTE — ED Notes (Signed)
Debbe Mounts, pt's daughter [phone]

## 2015-12-26 NOTE — Code Documentation (Signed)
Mrs. Offenbacker is a 82yo wf presenting to [facility] via [facility] for sudden onset confusion and aphasia.  Per report she is at baseline very active and oriented, but developed garbled speech at 1800.  On arrival to [facility] she was very agitated MAEE with no apparent focal deficits.  Her speech is clear on occasion but with inappropriate context.  Dr. Nicole Kindred spoke with her daughter, Debbe Mounts, via phone [phone]).  She will be admitted to medicine service for AMS.

## 2015-12-26 NOTE — Consult Note (Signed)
Admission H&P    Chief Complaint: Altered mental status area  HPI: Laura Parks is an 82 y.o. female with a history of type 2 diabetes mellitus, colon cancer, hypertension, hyperlipidemia, macular degeneration with blindness and an equivocal history of cognitive impairment, brought to the emergency room and code stroke status with acute confusion and agitation. Speech was described as slurred around 1800. No clear facial droop was described. She had no focal weakness of extremities. She was nauseated and vomited multiple times. She was also incontinent of stool. There was no gaze preference described. No seizure activity was described by family. CT scan of her head showed no acute intracranial abnormality. He was afebrile. CBC, was 9.9. Urinalysis is pending. Electrolytes were unremarkable except for potassium of 5.3 which is thought to possibly be related to hemolysis. No focal deficits were noted on exam. She remained lethargic throughout the evaluation. Code stroke was subsequently canceled, as acute aphasia was not clear and patient had no clear focal motor deficits.  Past Medical History  Diagnosis Date  . Type 2 diabetes mellitus ([place])   . Colon cancer ([place])   . Anemia   . Essential hypertension, benign   . Mixed hyperlipidemia   . Macular degeneration   . History of pneumonia   . Iron deficiency anemia     Erosive antral gastritis  . Cognitive impairment     Past Surgical History  Procedure Laterality Date  . Cataract extraction    . Cholecystectomy    . Appendectomy    . Right knee surgery    . Colon surgery for colon cancer      1992 by Dr Mendel Ryder  . Tubal ligation    . Colonoscopy  03/24/2012    Procedure: COLONOSCOPY;  Surgeon: Rogene Houston, MD;  Location: [facility];  Service: Endoscopy;  Laterality: N/A;  1200  . Esophagogastroduodenoscopy  08/04/2012    Procedure: ESOPHAGOGASTRODUODENOSCOPY (EGD);  Surgeon: Rogene Houston, MD;  Location: [facility];  Service:  Endoscopy;  Laterality: N/A;  325    Family History  Problem Relation Age of Onset  . COPD Mother   . Alzheimer's disease Mother   . Congestive Heart Failure Mother   . Diabetes Brother    Social History:  reports that she has never smoked. She has never used smokeless tobacco. She reports that she does not drink alcohol or use illicit drugs.  Allergies:  Allergies  Allergen Reactions  . Codeine Nausea Only    Medications: Patient's preadmission medications were reviewed by me.  ROS: History obtained from patient's daughter.  General ROS: negative for - chills, fatigue, fever, night sweats, weight gain or weight loss Psychological ROS: negative for - behavioral disorder, hallucinations, memory difficulties, mood swings or suicidal ideation Ophthalmic ROS: Legally blind secondary to chronic macular degeneration ENT ROS: negative for - epistaxis, nasal discharge, oral lesions, sore throat, tinnitus or vertigo Allergy and Immunology ROS: negative for - hives or itchy/watery eyes Hematological and Lymphatic ROS: negative for - bleeding problems, bruising or swollen lymph nodes Endocrine ROS: negative for - galactorrhea, hair pattern changes, polydipsia/polyuria or temperature intolerance Respiratory ROS: negative for - cough, hemoptysis, shortness of breath or wheezing Cardiovascular ROS: negative for - chest pain, dyspnea on exertion, edema or irregular heartbeat Gastrointestinal ROS: negative for - abdominal pain, diarrhea, hematemesis, nausea/vomiting or stool incontinence Genito-Urinary ROS: negative for - dysuria, hematuria, incontinence or urinary frequency/urgency Musculoskeletal ROS: negative for - joint swelling or muscular weakness Neurological ROS: as  noted in HPI Dermatological ROS: negative for rash and skin lesion changes  Physical Examination: Blood pressure 137/86, pulse 101, temperature 98.2 F (36.8 C), temperature source Rectal, resp. rate 23, weight 58.968 kg  (130 lb), SpO2 98 %.  HEENT-  Normocephalic, no lesions, without obvious abnormality.  Normal external eye and conjunctiva.  Normal TM's bilaterally.  Normal auditory canals and external ears. Normal external nose, mucus membranes and septum.  Normal pharynx. Neck supple with no masses, nodes, nodules or enlargement. Cardiovascular - regular rate and rhythm, S1, S2 normal, no murmur, click, rub or gallop Lungs - chest clear, no wheezing, rales, normal symmetric air entry Abdomen - soft, non-tender; bowel sounds normal; no masses,  no organomegaly Extremities - no joint deformities, effusion, or inflammation and no edema  Neurologic Examination: Patient was lethargic, but arousable he had she did not follow any commands. She was in no acute distress. Speech output was minimal and content was nonsensical. Pupils slightly larger than the right and both were moderately dilated and did not react to light. Extraocular movements were intact with right left lateral gaze. Face was symmetrical with no focal weakness. Speech, although minimal, was not dysarthric. Patient moved extremities equally with strength throughout. Muscle tone was flaccid throughout. Deep tendon reflexes were 1+ and symmetrical. Plantar responses were mute.  Results for orders placed or performed during the hospital encounter of 12/26/15 (from the past 48 hour(s))  I-stat troponin, ED (not at [facility], [facility])     Status: None   Collection Time: 12/26/15  9:35 PM  Result Value Ref Range   Troponin i, poc 0.05 0.00 - 0.08 ng/mL   Comment 3            Comment: Due to the release kinetics of cTnI, a negative result within the first hours of the onset of symptoms does not rule out myocardial infarction with certainty. If myocardial infarction is still suspected, repeat the test at appropriate intervals.   I-Stat Chem 8, ED  (not at [facility], [facility])     Status: Abnormal   Collection Time: 12/26/15  9:37 PM  Result Value Ref Range    Sodium 139 135 - 145 mmol/L   Potassium 5.2 (H) 3.5 - 5.1 mmol/L   Chloride 107 101 - 111 mmol/L   BUN 33 (H) 6 - 20 mg/dL   Creatinine, Ser 1.20 (H) 0.44 - 1.00 mg/dL   Glucose, Bld 168 (H) 65 - 99 mg/dL   Calcium, Ion 1.05 (L) 1.13 - 1.30 mmol/L   TCO2 23 0 - 100 mmol/L   Hemoglobin 12.9 12.0 - 15.0 g/dL   HCT 38.0 36.0 - 46.0 %  Protime-INR     Status: None   Collection Time: 12/26/15  9:41 PM  Result Value Ref Range   Prothrombin Time 12.7 11.6 - 15.2 seconds   INR 0.94 0.00 - 1.49  APTT     Status: Abnormal   Collection Time: 12/26/15  9:41 PM  Result Value Ref Range   aPTT 21 (L) 24 - 37 seconds  CBC     Status: Abnormal   Collection Time: 12/26/15  9:41 PM  Result Value Ref Range   WBC 9.9 4.0 - 10.5 K/uL   RBC 4.11 3.87 - 5.11 MIL/uL   Hemoglobin 11.8 (L) 12.0 - 15.0 g/dL   HCT 35.5 (L) 36.0 - 46.0 %   MCV 86.4 78.0 - 100.0 fL   MCH 28.7 26.0 - 34.0 pg   MCHC 33.2 30.0 - 36.0  g/dL   RDW 14.2 11.5 - 15.5 %   Platelets 147 (L) 150 - 400 K/uL  Differential     Status: Abnormal   Collection Time: 12/26/15  9:41 PM  Result Value Ref Range   Neutrophils Relative % 49 %   Neutro Abs 4.8 1.7 - 7.7 K/uL   Lymphocytes Relative 41 %   Lymphs Abs 4.1 (H) 0.7 - 4.0 K/uL   Monocytes Relative 9 %   Monocytes Absolute 0.9 0.1 - 1.0 K/uL   Eosinophils Relative 1 %   Eosinophils Absolute 0.1 0.0 - 0.7 K/uL   Basophils Relative 0 %   Basophils Absolute 0.0 0.0 - 0.1 K/uL  Comprehensive metabolic panel     Status: Abnormal   Collection Time: 12/26/15  9:41 PM  Result Value Ref Range   Sodium 140 135 - 145 mmol/L   Potassium 5.3 (H) 3.5 - 5.1 mmol/L    Comment: HEMOLYSIS AT THIS LEVEL MAY AFFECT RESULT   Chloride 106 101 - 111 mmol/L   CO2 21 (L) 22 - 32 mmol/L   Glucose, Bld 184 (H) 65 - 99 mg/dL   BUN 21 (H) 6 - 20 mg/dL   Creatinine, Ser 1.37 (H) 0.44 - 1.00 mg/dL   Calcium 9.3 8.9 - 10.3 mg/dL   Total Protein 6.9 6.5 - 8.1 g/dL   Albumin 4.0 3.5 - 5.0 g/dL   AST 27 15  - 41 U/L   ALT 16 14 - 54 U/L   Alkaline Phosphatase 84 38 - 126 U/L   Total Bilirubin 0.9 0.3 - 1.2 mg/dL   GFR calc non Af Amer 35 (L) >60 mL/min   GFR calc Af Amer 40 (L) >60 mL/min    Comment: (NOTE) The eGFR has been calculated using the CKD EPI equation. This calculation has not been validated in all clinical situations. eGFR's persistently <60 mL/min signify possible Chronic Kidney Disease.    Anion gap 13 5 - 15  I-Stat CG4 Lactic Acid, ED     Status: Abnormal   Collection Time: 12/26/15  9:54 PM  Result Value Ref Range   Lactic Acid, Venous 2.04 (HH) 0.5 - 2.0 mmol/L   Comment NOTIFIED PHYSICIAN    Ct Head Wo Contrast  12/26/2015  CLINICAL DATA:  Confusion and agitation EXAM: CT HEAD WITHOUT CONTRAST TECHNIQUE: Contiguous axial images were obtained from the base of the skull through the vertex without intravenous contrast. COMPARISON:  Brain MRI August 09, 2014 FINDINGS: Motion artifact makes this study less than optimal. There is moderate diffuse atrophy. There is no intracranial mass, hemorrhage, extra-axial fluid collection, or midline shift. There is patchy small vessel disease in the centra semiovale bilaterally. No acute infarct is evident. The middle cerebral artery attenuation is symmetric bilaterally. IMPRESSION: Suboptimal study due to motion artifact. Atrophy with patchy periventricular small vessel disease. No acute infarct is evident. No hemorrhage or mass effect. Note that subtle early infarct could be obscured given this degree of motion artifact, particularly in the posterior fossa region. Electronically Signed   By: Lowella Grip III M.D.   On: 12/26/2015 21:59    Assessment/Plan 82 year old lady presenting with acute mental status change with confusion and to lesser extent agitation and speech changes. Exam showed no focal abnormalities. CT scan of the head showed no acute intracranial abnormality. Acute stroke is somewhat unlikely but cannot be completely  ruled out. As well, generalized seizure with postictal confusion cannot be ruled out. Urinalysis and urine drug screen results are  pending.  Recommendations: 1. MRI of the brain without contrast 2. EEG, routine adult study  We will continue to follow this patient with you.  C.R. Nicole Kindred, [place] [facility] [phone]  12/26/2015, 10:33 PM

## 2015-12-27 ENCOUNTER — Encounter (HOSPITAL_COMMUNITY): Payer: Self-pay | Admitting: Internal Medicine

## 2015-12-27 ENCOUNTER — Inpatient Hospital Stay (HOSPITAL_COMMUNITY): Payer: Medicare Other

## 2015-12-27 DIAGNOSIS — I6789 Other cerebrovascular disease: Secondary | ICD-10-CM | POA: Diagnosis not present

## 2015-12-27 DIAGNOSIS — D72829 Elevated white blood cell count, unspecified: Secondary | ICD-10-CM | POA: Diagnosis present

## 2015-12-27 DIAGNOSIS — R402142 Coma scale, eyes open, spontaneous, at arrival to emergency department: Secondary | ICD-10-CM | POA: Diagnosis present

## 2015-12-27 DIAGNOSIS — N179 Acute kidney failure, unspecified: Secondary | ICD-10-CM | POA: Diagnosis present

## 2015-12-27 DIAGNOSIS — H3530 Unspecified macular degeneration: Secondary | ICD-10-CM | POA: Diagnosis present

## 2015-12-27 DIAGNOSIS — H548 Legal blindness, as defined in USA: Secondary | ICD-10-CM | POA: Diagnosis present

## 2015-12-27 DIAGNOSIS — I63039 Cerebral infarction due to thrombosis of unspecified carotid artery: Secondary | ICD-10-CM | POA: Diagnosis not present

## 2015-12-27 DIAGNOSIS — N170 Acute kidney failure with tubular necrosis: Secondary | ICD-10-CM | POA: Diagnosis present

## 2015-12-27 DIAGNOSIS — E119 Type 2 diabetes mellitus without complications: Secondary | ICD-10-CM | POA: Diagnosis present

## 2015-12-27 DIAGNOSIS — G459 Transient cerebral ischemic attack, unspecified: Secondary | ICD-10-CM | POA: Diagnosis present

## 2015-12-27 DIAGNOSIS — Z820 Family history of epilepsy and other diseases of the nervous system: Secondary | ICD-10-CM | POA: Diagnosis not present

## 2015-12-27 DIAGNOSIS — Z8249 Family history of ischemic heart disease and other diseases of the circulatory system: Secondary | ICD-10-CM | POA: Diagnosis not present

## 2015-12-27 DIAGNOSIS — I509 Heart failure, unspecified: Secondary | ICD-10-CM | POA: Diagnosis not present

## 2015-12-27 DIAGNOSIS — I6300 Cerebral infarction due to thrombosis of unspecified precerebral artery: Secondary | ICD-10-CM | POA: Diagnosis not present

## 2015-12-27 DIAGNOSIS — G9340 Encephalopathy, unspecified: Secondary | ICD-10-CM

## 2015-12-27 DIAGNOSIS — D696 Thrombocytopenia, unspecified: Secondary | ICD-10-CM | POA: Diagnosis present

## 2015-12-27 DIAGNOSIS — F0150 Vascular dementia without behavioral disturbance: Secondary | ICD-10-CM | POA: Diagnosis present

## 2015-12-27 DIAGNOSIS — I214 Non-ST elevation (NSTEMI) myocardial infarction: Secondary | ICD-10-CM

## 2015-12-27 DIAGNOSIS — R402212 Coma scale, best verbal response, none, at arrival to emergency department: Secondary | ICD-10-CM | POA: Diagnosis not present

## 2015-12-27 DIAGNOSIS — Z85038 Personal history of other malignant neoplasm of large intestine: Secondary | ICD-10-CM | POA: Diagnosis not present

## 2015-12-27 DIAGNOSIS — R400 Somnolence: Secondary | ICD-10-CM | POA: Diagnosis not present

## 2015-12-27 DIAGNOSIS — I5040 Unspecified combined systolic (congestive) and diastolic (congestive) heart failure: Secondary | ICD-10-CM | POA: Diagnosis present

## 2015-12-27 DIAGNOSIS — E875 Hyperkalemia: Secondary | ICD-10-CM | POA: Diagnosis not present

## 2015-12-27 DIAGNOSIS — Z833 Family history of diabetes mellitus: Secondary | ICD-10-CM | POA: Diagnosis not present

## 2015-12-27 DIAGNOSIS — R000 Tachycardia, unspecified: Secondary | ICD-10-CM | POA: Diagnosis present

## 2015-12-27 DIAGNOSIS — I480 Paroxysmal atrial fibrillation: Secondary | ICD-10-CM | POA: Diagnosis not present

## 2015-12-27 DIAGNOSIS — I639 Cerebral infarction, unspecified: Secondary | ICD-10-CM

## 2015-12-27 DIAGNOSIS — E872 Acidosis: Secondary | ICD-10-CM | POA: Diagnosis present

## 2015-12-27 DIAGNOSIS — I63139 Cerebral infarction due to embolism of unspecified carotid artery: Secondary | ICD-10-CM | POA: Diagnosis not present

## 2015-12-27 DIAGNOSIS — I248 Other forms of acute ischemic heart disease: Secondary | ICD-10-CM | POA: Diagnosis present

## 2015-12-27 DIAGNOSIS — E860 Dehydration: Secondary | ICD-10-CM | POA: Diagnosis not present

## 2015-12-27 DIAGNOSIS — Z9842 Cataract extraction status, left eye: Secondary | ICD-10-CM | POA: Diagnosis not present

## 2015-12-27 DIAGNOSIS — N178 Other acute kidney failure: Secondary | ICD-10-CM | POA: Diagnosis not present

## 2015-12-27 DIAGNOSIS — S0990XA Unspecified injury of head, initial encounter: Secondary | ICD-10-CM | POA: Diagnosis not present

## 2015-12-27 DIAGNOSIS — I110 Hypertensive heart disease with heart failure: Secondary | ICD-10-CM | POA: Diagnosis present

## 2015-12-27 DIAGNOSIS — R410 Disorientation, unspecified: Secondary | ICD-10-CM | POA: Diagnosis not present

## 2015-12-27 DIAGNOSIS — R402352 Coma scale, best motor response, localizes pain, at arrival to emergency department: Secondary | ICD-10-CM | POA: Diagnosis present

## 2015-12-27 DIAGNOSIS — R7989 Other specified abnormal findings of blood chemistry: Secondary | ICD-10-CM | POA: Diagnosis not present

## 2015-12-27 DIAGNOSIS — I2510 Atherosclerotic heart disease of native coronary artery without angina pectoris: Secondary | ICD-10-CM | POA: Diagnosis present

## 2015-12-27 DIAGNOSIS — Z515 Encounter for palliative care: Secondary | ICD-10-CM | POA: Diagnosis not present

## 2015-12-27 DIAGNOSIS — Z8701 Personal history of pneumonia (recurrent): Secondary | ICD-10-CM | POA: Diagnosis not present

## 2015-12-27 DIAGNOSIS — R339 Retention of urine, unspecified: Secondary | ICD-10-CM | POA: Diagnosis not present

## 2015-12-27 DIAGNOSIS — R159 Full incontinence of feces: Secondary | ICD-10-CM | POA: Diagnosis present

## 2015-12-27 DIAGNOSIS — E782 Mixed hyperlipidemia: Secondary | ICD-10-CM | POA: Diagnosis present

## 2015-12-27 DIAGNOSIS — I10 Essential (primary) hypertension: Secondary | ICD-10-CM | POA: Diagnosis not present

## 2015-12-27 DIAGNOSIS — R4182 Altered mental status, unspecified: Secondary | ICD-10-CM | POA: Diagnosis not present

## 2015-12-27 DIAGNOSIS — K5730 Diverticulosis of large intestine without perforation or abscess without bleeding: Secondary | ICD-10-CM | POA: Diagnosis not present

## 2015-12-27 DIAGNOSIS — Z9841 Cataract extraction status, right eye: Secondary | ICD-10-CM | POA: Diagnosis not present

## 2015-12-27 DIAGNOSIS — J90 Pleural effusion, not elsewhere classified: Secondary | ICD-10-CM | POA: Diagnosis not present

## 2015-12-27 DIAGNOSIS — I4510 Unspecified right bundle-branch block: Secondary | ICD-10-CM | POA: Diagnosis present

## 2015-12-27 DIAGNOSIS — Z825 Family history of asthma and other chronic lower respiratory diseases: Secondary | ICD-10-CM | POA: Diagnosis not present

## 2015-12-27 DIAGNOSIS — I6340 Cerebral infarction due to embolism of unspecified cerebral artery: Secondary | ICD-10-CM | POA: Diagnosis not present

## 2015-12-27 LAB — COMPREHENSIVE METABOLIC PANEL
ALBUMIN: 3.4 g/dL — AB (ref 3.5–5.0)
ALK PHOS: 63 U/L (ref 38–126)
ALT: 35 U/L (ref 14–54)
AST: 98 U/L — AB (ref 15–41)
Anion gap: 11 (ref 5–15)
BUN: 42 mg/dL — ABNORMAL HIGH (ref 6–20)
CALCIUM: 8.4 mg/dL — AB (ref 8.9–10.3)
CHLORIDE: 108 mmol/L (ref 101–111)
CO2: 16 mmol/L — AB (ref 22–32)
CREATININE: 2.67 mg/dL — AB (ref 0.44–1.00)
GFR calc non Af Amer: 15 mL/min — ABNORMAL LOW (ref >60)
GFR, EST AFRICAN AMERICAN: 18 mL/min — AB (ref >60)
GLUCOSE: 226 mg/dL — AB (ref 65–99)
Potassium: 5.1 mmol/L (ref 3.5–5.1)
SODIUM: 135 mmol/L (ref 135–145)
Total Bilirubin: 0.8 mg/dL (ref 0.3–1.2)
Total Protein: 6.1 g/dL — ABNORMAL LOW (ref 6.5–8.1)

## 2015-12-27 LAB — CBC WITH DIFFERENTIAL/PLATELET
BASOS PCT: 0 %
Basophils Absolute: 0 10*3/uL (ref 0.0–0.1)
EOS ABS: 0 10*3/uL (ref 0.0–0.7)
Eosinophils Relative: 0 %
HEMATOCRIT: 33 % — AB (ref 36.0–46.0)
HEMOGLOBIN: 10.7 g/dL — AB (ref 12.0–15.0)
LYMPHS ABS: 1.5 10*3/uL (ref 0.7–4.0)
Lymphocytes Relative: 11 %
MCH: 27.9 pg (ref 26.0–34.0)
MCHC: 32.4 g/dL (ref 30.0–36.0)
MCV: 85.9 fL (ref 78.0–100.0)
MONO ABS: 1.1 10*3/uL — AB (ref 0.1–1.0)
MONOS PCT: 8 %
NEUTROS PCT: 81 %
Neutro Abs: 10.8 10*3/uL — ABNORMAL HIGH (ref 1.7–7.7)
Platelets: 137 10*3/uL — ABNORMAL LOW (ref 150–400)
RBC: 3.84 MIL/uL — ABNORMAL LOW (ref 3.87–5.11)
RDW: 14.5 % (ref 11.5–15.5)
WBC: 13.4 10*3/uL — ABNORMAL HIGH (ref 4.0–10.5)

## 2015-12-27 LAB — GLUCOSE, CAPILLARY
GLUCOSE-CAPILLARY: 264 mg/dL — AB (ref 65–99)
GLUCOSE-CAPILLARY: 220 mg/dL — AB (ref 65–99)
GLUCOSE-CAPILLARY: 257 mg/dL — AB (ref 65–99)
GLUCOSE-CAPILLARY: 212 mg/dL — AB (ref 65–99)
Glucose-Capillary: 172 mg/dL — ABNORMAL HIGH (ref 65–99)

## 2015-12-27 LAB — LIPID PANEL
CHOLESTEROL: 180 mg/dL (ref 0–200)
HDL: 82 mg/dL (ref >40)
LDL Cholesterol: 84 mg/dL (ref 0–99)
Total CHOL/HDL Ratio: 2.2 RATIO
Triglycerides: 68 mg/dL (ref <150)
VLDL: 14 mg/dL (ref 0–40)

## 2015-12-27 LAB — BLOOD GAS, ARTERIAL
Acid-base deficit: 8.2 mmol/L — ABNORMAL HIGH (ref 0.0–2.0)
Bicarbonate: 16.2 mEq/L — ABNORMAL LOW (ref 20.0–24.0)
Drawn by: 40530
O2 Content: 2 L/min
O2 Saturation: 98.6 %
PCO2 ART: 29.5 mmHg — AB (ref 35.0–45.0)
PH ART: 7.36 (ref 7.350–7.450)
Patient temperature: 99
TCO2: 17.1 mmol/L (ref 0–100)
pO2, Arterial: 142 mmHg — ABNORMAL HIGH (ref 80.0–100.0)

## 2015-12-27 LAB — TROPONIN I
TROPONIN I: 7.77 ng/mL — AB (ref <0.031)
TROPONIN I: 32.73 ng/mL — AB (ref <0.031)
Troponin I: 19.8 ng/mL (ref <0.031)
Troponin I: 35.31 ng/mL (ref <0.031)

## 2015-12-27 LAB — BRAIN NATRIURETIC PEPTIDE: B NATRIURETIC PEPTIDE 5: 252.5 pg/mL — AB (ref 0.0–100.0)

## 2015-12-27 LAB — MRSA PCR SCREENING: MRSA BY PCR: NEGATIVE

## 2015-12-27 LAB — HEPARIN LEVEL (UNFRACTIONATED): HEPARIN UNFRACTIONATED: 0.27 [IU]/mL — AB (ref 0.30–0.70)

## 2015-12-27 LAB — LACTIC ACID, PLASMA: LACTIC ACID, VENOUS: 1 mmol/L (ref 0.5–2.0)

## 2015-12-27 MED ORDER — ASPIRIN 325 MG PO TABS
325.0000 mg | ORAL_TABLET | Freq: Every day | ORAL | Status: DC
  Administered 2015-12-30 – 2016-01-06 (×5): 325 mg via ORAL
  Filled 2015-12-27 (×6): qty 1

## 2015-12-27 MED ORDER — CHOLESTYRAMINE LIGHT 4 G PO PACK
4.0000 g | PACK | Freq: Two times a day (BID) | ORAL | Status: DC
  Administered 2015-12-29 – 2016-01-05 (×10): 4 g via ORAL
  Filled 2015-12-27 (×22): qty 1

## 2015-12-27 MED ORDER — CITALOPRAM HYDROBROMIDE 20 MG PO TABS
40.0000 mg | ORAL_TABLET | Freq: Every day | ORAL | Status: DC
  Filled 2015-12-27: qty 2

## 2015-12-27 MED ORDER — STROKE: EARLY STAGES OF RECOVERY BOOK
Freq: Once | Status: AC
  Administered 2015-12-27: 04:00:00

## 2015-12-27 MED ORDER — SODIUM CHLORIDE 0.9 % IV SOLN
INTRAVENOUS | Status: DC
  Administered 2015-12-27 – 2015-12-29 (×3): via INTRAVENOUS

## 2015-12-27 MED ORDER — MIRTAZAPINE 15 MG PO TABS: 15.0000 mg | ORAL_TABLET | Freq: Every day | ORAL | Status: DC

## 2015-12-27 MED ORDER — METOPROLOL TARTRATE 1 MG/ML IV SOLN
5.0000 mg | Freq: Once | INTRAVENOUS | Status: AC
  Administered 2015-12-27: 5 mg via INTRAVENOUS
  Filled 2015-12-27: qty 5

## 2015-12-27 MED ORDER — IOPAMIDOL (ISOVUE-300) INJECTION 61%
75.0000 mL | Freq: Once | INTRAVENOUS | Status: AC | PRN
  Administered 2015-12-27: 75 mL via INTRAVENOUS

## 2015-12-27 MED ORDER — INSULIN ASPART 100 UNIT/ML ~~LOC~~ SOLN
0.0000 [IU] | SUBCUTANEOUS | Status: DC
  Administered 2015-12-27 (×2): 5 [IU] via SUBCUTANEOUS
  Administered 2015-12-27: 3 [IU] via SUBCUTANEOUS
  Administered 2015-12-27 – 2015-12-28 (×2): 2 [IU] via SUBCUTANEOUS
  Administered 2015-12-28 (×2): 1 [IU] via SUBCUTANEOUS
  Administered 2015-12-28: 2 [IU] via SUBCUTANEOUS
  Administered 2015-12-29 – 2015-12-30 (×4): 1 [IU] via SUBCUTANEOUS

## 2015-12-27 MED ORDER — METOPROLOL TARTRATE 1 MG/ML IV SOLN
2.5000 mg | Freq: Once | INTRAVENOUS | Status: AC
  Administered 2015-12-27: 2.5 mg via INTRAVENOUS
  Filled 2015-12-27: qty 5

## 2015-12-27 MED ORDER — ACETAMINOPHEN 325 MG PO TABS
650.0000 mg | ORAL_TABLET | ORAL | Status: DC | PRN
  Administered 2015-12-29: 650 mg via ORAL
  Filled 2015-12-27 (×2): qty 2

## 2015-12-27 MED ORDER — ATORVASTATIN CALCIUM 10 MG PO TABS
10.0000 mg | ORAL_TABLET | Freq: Every day | ORAL | Status: DC
  Filled 2015-12-27: qty 1

## 2015-12-27 MED ORDER — ACETAMINOPHEN 650 MG RE SUPP: 650.0000 mg | RECTAL | Status: DC | PRN

## 2015-12-27 MED ORDER — CETYLPYRIDINIUM CHLORIDE 0.05 % MT LIQD
7.0000 mL | Freq: Two times a day (BID) | OROMUCOSAL | Status: DC
  Administered 2015-12-28 – 2016-01-06 (×16): 7 mL via OROMUCOSAL

## 2015-12-27 MED ORDER — ASPIRIN 300 MG RE SUPP
300.0000 mg | Freq: Every day | RECTAL | Status: DC
  Administered 2015-12-27 – 2016-01-02 (×5): 300 mg via RECTAL
  Filled 2015-12-27 (×5): qty 1

## 2015-12-27 MED ORDER — IOPAMIDOL (ISOVUE-300) INJECTION 61%
INTRAVENOUS | Status: AC
  Filled 2015-12-27: qty 75

## 2015-12-27 MED ORDER — HEPARIN (PORCINE) IN NACL 100-0.45 UNIT/ML-% IJ SOLN
1150.0000 [IU]/h | INTRAMUSCULAR | Status: DC
  Administered 2015-12-27: 800 [IU]/h via INTRAVENOUS
  Administered 2015-12-30: 850 [IU]/h via INTRAVENOUS
  Administered 2015-12-30: 950 [IU]/h via INTRAVENOUS
  Administered 2016-01-01: 1250 [IU]/h via INTRAVENOUS
  Administered 2016-01-02 – 2016-01-03 (×2): 1400 [IU]/h via INTRAVENOUS
  Administered 2016-01-03: 1300 [IU]/h via INTRAVENOUS
  Filled 2015-12-27 (×8): qty 250

## 2015-12-27 NOTE — Progress Notes (Signed)
[place] for Heparin  Indication: chest pain/ACS  Allergies  Allergen Reactions  . Codeine Nausea Only   Patient Measurements: Height: 5\' 2"  (157.5 cm) Weight: 148 lb 9.4 oz (67.4 kg) IBW/kg (Calculated) : 50.1  Vital Signs: BP: 107/57 mmHg (04/13 1231) Pulse Rate: 71 (04/13 0734)  Labs:  Recent Labs  12/26/15 2137 12/26/15 2141 12/26/15 2248 12/27/15 0517 12/27/15 1134 12/27/15 1532  HGB 12.9 11.8*  --   --   --   --   HCT 38.0 35.5*  --   --   --   --   PLT  --  147*  --   --   --   --   APTT  --  21*  --   --   --   --   LABPROT  --  12.7  --   --   --   --   INR  --  0.94  --   --   --   --   HEPARINUNFRC  --   --   --   --   --  0.27*  CREATININE 1.20* 1.37* 1.39*  --   --   --   TROPONINI  --   --   --  7.77* 19.80*  --     Medical History: Past Medical History  Diagnosis Date  . Type 2 diabetes mellitus ([place])   . Colon cancer ([place])   . Anemia   . Essential hypertension, benign   . Mixed hyperlipidemia   . Macular degeneration   . History of pneumonia   . Iron deficiency anemia     Erosive antral gastritis  . Cognitive impairment     Assessment: 82 y/o F with elevated troponin, starting heparin, Hgb 11.8, mild renal dysfunction, other labs reviewed. Undergoing stroke work-up, negative so far.   Initial HL is slightly subtherapeutic on heparin 800 units/hr. Nurse reports no issues with infusion or bleeding.   Goal of Therapy:  Heparin level 0.3-0.5 units/ml, while undergoing stroke work-up, negative so far Monitor platelets by anticoagulation protocol: Yes   Plan:  Increase heparin to 900 units/hr 8h HL Daily CBC/HL Monitor for bleeding  Andrey Cota. Diona Foley, PharmD, BCPS Clinical Pharmacist Pager [phone] 12/27/2015,4:39 PM

## 2015-12-27 NOTE — Progress Notes (Signed)
Troponin 7.77. No noted CP in ED but pt was unable to participate in history due to mental status. Found down, unsure of how long down, and brought to ED by family because she was dysarthric, confused, and didn't recognize her family members. Unclear as to why troponins were ordered per admit note. Pt was a code stroke which was cancelled. CT head neg. Neuro saw-TIA vs. Seizure? This NP spoke to Dr. Nicole Kindred with neuro to get clearance to start Heparin for ? ACS.  Pt is unable at this time to state if she is having CP. NP ordered stat 12 lead for comparison to one in ED.  NP called and spoke to B. Simmons about pt and need for cardio consult. Discussed hx and treatment/actions so far. [place] will contact cardio doc and see the pt asap.  Clance Boll, NP [facility]

## 2015-12-27 NOTE — Progress Notes (Signed)
SLP Cancellation Note  Patient Details Name: Laura Parks MRN: [id number] DOB: February 14, 1930   Cancelled evaluation:    Pt not sufficiently alert to participate in swallow assessment.  D/W RN; he will page our services if she improves this afternoon.  Otherwise, will follow up next date.       Juan Quam [place] 12/27/2015, 9:58 AM

## 2015-12-27 NOTE — Progress Notes (Signed)
STROKE TEAM PROGRESS NOTE   HISTORY OF PRESENT ILLNESS Laura Parks is an 82 y.o. female with a history of type 2 diabetes mellitus, colon cancer, hypertension, hyperlipidemia, macular degeneration with blindness and an equivocal history of cognitive impairment, brought to the emergency room and code stroke status with acute confusion and agitation. Speech was described as slurred around 1800. No clear facial droop was described. She had no focal weakness of extremities. She was nauseated and vomited multiple times. She was also incontinent of stool. There was no gaze preference described. No seizure activity was described by family. CT scan of her head showed no acute intracranial abnormality. He was afebrile. CBC, was 9.9. Urinalysis is pending. Electrolytes were unremarkable except for potassium of 5.3 which is thought to possibly be related to hemolysis. No focal deficits were noted on exam. She remained lethargic throughout the evaluation. Code stroke was subsequently canceled, as acute aphasia was not clear and patient had no clear focal motor deficits. Patient was not administered IV t-PA secondary to nonfocal stroke symptoms. She was admitted for further evaluation and treatment.   SUBJECTIVE (INTERVAL HISTORY) Her RN is at the bedside, no family present during initial rounds. Daughter and husband arrived after we left and we were called back. Patient lethargic, obtunded, cardiology has seen and suspect acute coronary event, and possible atrial fibrillation - unfortunately, pt not a candidate for acute intervention. troponins cycling. Pt was put on heparin drip.    OBJECTIVE Temp:  [98.2 F (36.8 C)-99.5 F (37.5 C)] 99.5 F (37.5 C) (04/13 0317) Pulse Rate:  [71-133] 71 (04/13 0734) Cardiac Rhythm:  [-] Normal sinus rhythm;Bundle branch block (04/13 0700) Resp:  [15-23] 16 (04/13 0528) BP: (103-137)/(56-113) 103/59 mmHg (04/13 0734) SpO2:  [95 %-99 %] 99 % (04/13 0734) Weight:  [58.968  kg (130 lb)-67.4 kg (148 lb 9.4 oz)] 67.4 kg (148 lb 9.4 oz) (04/13 0317)  CBC:  Recent Labs Lab 12/26/15 2137 12/26/15 2141  WBC  --  9.9  NEUTROABS  --  4.8  HGB 12.9 11.8*  HCT 38.0 35.5*  MCV  --  86.4  PLT  --  147*    Basic Metabolic Panel:  Recent Labs Lab 12/26/15 2141 12/26/15 2248  NA 140 140  K 5.3* 5.0  CL 106 107  CO2 21* 20*  GLUCOSE 184* 231*  BUN 21* 23*  CREATININE 1.37* 1.39*  CALCIUM 9.3 8.9    Lipid Panel:    Component Value Date/Time   CHOL 180 12/27/2015 0517   TRIG 68 12/27/2015 0517   HDL 82 12/27/2015 0517   CHOLHDL 2.2 12/27/2015 0517   VLDL 14 12/27/2015 0517   LDLCALC 84 12/27/2015 0517   HgbA1c: No results found for: HGBA1C Urine Drug Screen: No results found for: LABOPIA, COCAINSCRNUR, LABBENZ, AMPHETMU, THCU, LABBARB    IMAGING I have personally reviewed the radiological images below and agree with the radiology interpretations.  Ct Head Wo Contrast 12/26/2015  Suboptimal study due to motion artifact. Atrophy with patchy periventricular small vessel disease. No acute infarct is evident. No hemorrhage or mass effect.   Mri & Mra Brain Wo Contrast 12/27/2015   Multifocal areas of acute nonhemorrhagic infarction, subcentimeter in size of a symmetric nature affecting the cortex, subcortical white matter, basal ganglia, and cerebellum. These are most consistent with watershed infarcts, commonly associated with cardiac arrhythmia or hypotensive event. Shower of emboli not excluded but less favored. Atrophy and small vessel disease similar to priors. No large vessel occlusion  on MRA. Potentially flow reducing stenosis of the cavernous ICA on the RIGHT, 50-75%.   Dg Chest Port 1 View 12/26/2015  Findings consistent with congestive heart failure. No airspace consolidation.  CUS - pending  TTE - pending  LE venous doppler - pending   PHYSICAL EXAM  Temp:  [98.2 F (36.8 C)-99.5 F (37.5 C)] 99.5 F (37.5 C) (04/13 0317) Pulse Rate:   [71-133] 71 (04/13 0734) Resp:  [15-23] 16 (04/13 0528) BP: (103-137)/(56-113) 107/57 mmHg (04/13 1231) SpO2:  [95 %-99 %] 99 % (04/13 1231) Weight:  [130 lb (58.968 kg)-148 lb 9.4 oz (67.4 kg)] 148 lb 9.4 oz (67.4 kg) (04/13 0317)  General - Well nourished, well developed, obtunded, not responsive.  Ophthalmologic - Fundi not visualized due to noncooperation.  Cardiovascular - Regular rate and rhythm, not in irregular rhythm.  Neuro - obtunded, not responsive, barely open eyes on pain stimulation, not following commands. With eye forced open, not blinking to visual threat bilaterally. PERRL, doll's eye present, positive corneal bilaterally. left facial droop. RUE and RLE spontaneously movement with increased muscle tone on the RUE. However, LUE and LLE hemiparesis, with 2/5 LUE and 3/5 LLE on pain stimulation. DTR 1+ and no babinski. Sensation, coordination and gait not tested.   ASSESSMENT/PLAN [name] is a 82 y.o. female with history of type 2 diabetes mellitus, colon cancer, hypertension, hyperlipidemia, macular degeneration with blindness and an equivocal history of cognitive impairment  presenting with altered mental status. She did not receive IV t-PA due to non-focal neurologic exam.   Stroke:  Bilateral, infra and supra-tentorial infarcts, consistent with cardioembolic infarcts secondary to either atrial fibrillation vs acute MI  Resultant  L facial, L arm and leg weakness, obtunded  MRI  Acute bilateral infra- and supratentorial small infarcts  MRA  No large vessel occlusion  Carotid Doppler  pending   2D Echo  pending   LE venous doppler pending   LDL 84  HgbA1c pending  Heparin 5000 units sq tid for VTE prophylaxis  Diet NPO time specified  No antithrombotic prior to admission, now on aspirin 300 mg suppository daily. Ok for anticoagulation with IV heparin from stroke standpoint given small sizes of infarcts.  Ongoing aggressive stroke risk factor  management  Therapy recommendations:  pending   Disposition:  pending   Elevated tropoinin Acute coronary syndrome vs atrial fibrillation   Troponin 0.05 on arrival, up to 7.77 next am with EKG  EKG concerning for afib, not confirmed yet  Cardiology on board  On ASA and heparin drip  Hypertension  Normal in the 100-120s since arrival Permissive hypertension (OK if <180/105) for 24-48 hours post stroke and then gradually normalized within 5-7 days.  Hyperlipidemia  Home meds:  lipitor 10, resumed in hospital (though NPO)  LDL 84, goal < 70  Continue statin at discharge  Diabetes  HgbA1c pending , goal < 7.0  CBG elevated  On SSI  Other Stroke Risk Factors  Advanced age  No chest pain or abnormal findings on CXR to support aortic dissection  Other Active Problems  Baseline cognitive impairment - followed by Dr. Jaynee Eagles  B12 deficiency - follow up with Dr. Jaynee Eagles  Hyperkalemia  AKI  Possible CHF on CXR  Legally blind  Colon cancer  Hospital day # 0  Rosalin Hawking, MD PhD Stroke Neurology 12/27/2015 2:23 PM   To contact Stroke Continuity provider, please refer to [email or web address] After hours, contact General Neurology

## 2015-12-27 NOTE — Consult Note (Signed)
Patient ID: KAYELONI FLORIDA MRN: [id number], DOB/AGE: 80-Nov-1931   Admit date: 12/26/2015   Primary Physician: Glenda Chroman., MD Primary Cardiologist: New  Reason For Consult: Abnormal Troponin  Pt. Profile:  82 year old female with no prior cardiac history but with a past medical history which includes type 2 diabetes, essential hypertension, mixed hyperlipidemia, history of colon cancer, iron deficiency anemia and cognitive impairment, found down by her family night of 12/26/15 with altered mental status, word salad and right facial droop. She presented as a code stroke however this was canceled. STAT head CT was negative. MRI pending. Patient is now severely altered and nonverbal. Cardiology consultation called for an elevated troponin of 7.7.   Problem List  Past Medical History  Diagnosis Date  . Type 2 diabetes mellitus ([place])   . Colon cancer ([place])   . Anemia   . Essential hypertension, benign   . Mixed hyperlipidemia   . Macular degeneration   . History of pneumonia   . Iron deficiency anemia     Erosive antral gastritis  . Cognitive impairment     Past Surgical History  Procedure Laterality Date  . Cataract extraction    . Cholecystectomy    . Appendectomy    . Right knee surgery    . Colon surgery for colon cancer      1992 by Dr Mendel Ryder  . Tubal ligation    . Colonoscopy  03/24/2012    Procedure: COLONOSCOPY;  Surgeon: Rogene Houston, MD;  Location: [facility];  Service: Endoscopy;  Laterality: N/A;  1200  . Esophagogastroduodenoscopy  08/04/2012    Procedure: ESOPHAGOGASTRODUODENOSCOPY (EGD);  Surgeon: Rogene Houston, MD;  Location: [facility];  Service: Endoscopy;  Laterality: N/A;  325     Allergies  Allergies  Allergen Reactions  . Codeine Nausea Only    HPI  82 year old female with no prior cardiac history but with a past medical history which includes type 2 diabetes, essential hypertension, mixed hyperlipidemia, history of colon cancer,  iron deficiency anemia and cognitive impairment, found down by her family night of 12/26/15 with altered mental status, word salad and right facial droop. She presented as a code stroke however this was canceled. Stat head CT was negative. MRI pending. Patient is now severely altered and nonverbal. Cardiology consultation called for an elevated troponin of 7.7.   Unfortunately no family is currently present at bedside at time of initial assessment. As the patient is nonverbal, we are unable to elicit any history regarding any anginal symptomatology. Her EKG shows questionable atrial fibrillation with competing junctional pacemaker, left axis deviation, right bundle branch block possible anterolateral infarct.  Note: The patient is currently FULL CODE.  Home Medications  Prior to Admission medications   Medication Sig Start Date End Date Taking? Authorizing Provider  mirtazapine (REMERON) 15 MG tablet Take 15 mg by mouth at bedtime.   Yes Historical Provider, MD  alendronate (FOSAMAX) 70 MG tablet Take 70 mg by mouth every 7 (seven) days. Take with a full glass of water on an empty stomach.    Historical Provider, MD  atorvastatin (LIPITOR) 10 MG tablet Take 10 mg by mouth daily.    Historical Provider, MD  cholestyramine Lucrezia Starch) 4 G packet Take 4 g by mouth 2 (two) times daily.     Historical Provider, MD  citalopram (CELEXA) 40 MG tablet Take 1 tablet (40 mg total) by mouth daily. 08/31/14   Melvenia Beam, MD  Cyanocobalamin (VITAMIN B-12)  2500 MCG SUBL Place 2,500 mcg under the tongue.    Historical Provider, MD  ergocalciferol (VITAMIN D2) 50000 UNITS capsule Take 50,000 Units by mouth once a week. On Sundays    Historical Provider, MD  ferrous sulfate 325 (65 FE) MG tablet Take 325 mg by mouth daily with breakfast.    Historical Provider, MD  metoprolol succinate (TOPROL-XL) 50 MG 24 hr tablet Take 50 mg by mouth daily. Take with or immediately following a meal.    Historical Provider, MD    pioglitazone (ACTOS) 30 MG tablet Take 15 mg by mouth daily.     Historical Provider, MD    Family History  Family History  Problem Relation Age of Onset  . COPD Mother   . Alzheimer's disease Mother   . Congestive Heart Failure Mother   . Diabetes Brother     Social History  Social History   Social History  . Marital Status: Married    Spouse Name: N/A  . Number of Children: N/A  . Years of Education: N/A   Occupational History  . Not on file.   Social History Main Topics  . Smoking status: Never Smoker   . Smokeless tobacco: Never Used  . Alcohol Use: No  . Drug Use: No  . Sexual Activity: Not on file   Other Topics Concern  . Not on file   Social History Narrative     Review of Systems - unable to assess given AMS    Physical Exam  Blood pressure 103/56, pulse 80, temperature 99.5 F (37.5 C), temperature source Axillary, resp. rate 16, weight 148 lb 9.4 oz (67.4 kg), SpO2 95 %.  General: breathing that is unlabored, palpable pulses but nonverbal.  Psych: Altered Neuro: Altered and nonverbal.  HEENT: Not following commands to open eyes Neck: Supple without bruits or JVD. Lungs:  Resp regular and unlabored, CTA. Heart: RRR no s3, s4, or murmurs. Abdomen: Soft, non-tender, non-distended, BS + x 4.  Extremities: No clubbing, cyanosis or edema. DP/PT/Radials 2+ and equal bilaterally.  Labs  Troponin [facility] Test)  Recent Labs  12/26/15 2135  TROPIPOC 0.05    Recent Labs  12/27/15 0517  TROPONINI 7.77*   Lab Results  Component Value Date   WBC 9.9 12/26/2015   HGB 11.8* 12/26/2015   HCT 35.5* 12/26/2015   MCV 86.4 12/26/2015   PLT 147* 12/26/2015    Recent Labs Lab 12/26/15 2248  NA 140  K 5.0  CL 107  CO2 20*  BUN 23*  CREATININE 1.39*  CALCIUM 8.9  PROT 6.5  BILITOT 0.8  ALKPHOS 80  ALT 14  AST 23  GLUCOSE 231*   Lab Results  Component Value Date   CHOL 180 12/27/2015   HDL 82 12/27/2015   LDLCALC 84  12/27/2015   TRIG 68 12/27/2015   No results found for: DDIMER   Radiology/Studies  Ct Head Wo Contrast  12/26/2015  ADDENDUM REPORT: 12/26/2015 22:37 ADDENDUM: Critical Value/emergent results were called by telephone at the time of interpretation on 12/26/2015 at 10:30 pm to Dr. Nicole Kindred, neurology, who verbally acknowledged these results. Electronically Signed   By: Lowella Grip III M.D.   On: 12/26/2015 22:37  12/26/2015  CLINICAL DATA:  Confusion and agitation EXAM: CT HEAD WITHOUT CONTRAST TECHNIQUE: Contiguous axial images were obtained from the base of the skull through the vertex without intravenous contrast. COMPARISON:  Brain MRI August 09, 2014 FINDINGS: Motion artifact makes this study less than optimal.  There is moderate diffuse atrophy. There is no intracranial mass, hemorrhage, extra-axial fluid collection, or midline shift. There is patchy small vessel disease in the centra semiovale bilaterally. No acute infarct is evident. The middle cerebral artery attenuation is symmetric bilaterally. IMPRESSION: Suboptimal study due to motion artifact. Atrophy with patchy periventricular small vessel disease. No acute infarct is evident. No hemorrhage or mass effect. Note that subtle early infarct could be obscured given this degree of motion artifact, particularly in the posterior fossa region. Electronically Signed: By: Lowella Grip III M.D. On: 12/26/2015 21:59   Dg Chest Port 1 View  12/26/2015  CLINICAL DATA:  Shortness of breath with altered mental status EXAM: PORTABLE CHEST 1 VIEW COMPARISON:  December 10, 2010 FINDINGS: There is diffuse interstitial edema. There is cardiomegaly with pulmonary venous hypertension. No airspace consolidation. No adenopathy. There is calcification in the mitral annulus. There is degenerative change in the thoracic spine. IMPRESSION: Findings consistent with congestive heart failure. No airspace consolidation. Electronically Signed   By: Lowella Grip  III M.D.   On: 12/26/2015 23:33      ASSESSMENT AND PLAN  Active Problems:   Essential hypertension, benign   Type 2 diabetes mellitus (H[facility])   Hyperkalemia   CHF (congestive heart failure) (H[facility])   Acute encephalopathy   TIA (transient ischemic attack)   AKI (acute kidney injury) ([place])   Dehydration   Legal blindness   1. Abnormal Troponin: Initial troponin is 7.77. Lactic acid is 2.04. EKG shows left axis deviation with right bundle branch block possible anterolateral infarct. Given the patient's altered and nonverbal state including the fact that no family is currently present at bedside, we are unable to elicit any history at this time regarding any recent anginal symptomatology. Regardless, given her current state, she is currently not a suitable cath candidate. For now we will continue medical therapy. The admitting team, [facility], has consulted with neurology who has cleared the patient to start IV heparin. Continue ASA and Lipitor. Continue to cycle cardiac enzymes x 3 to assess trend. Can reconsider cath if her neurological status improves.   2. Altered Mental Status: per ED report patient status has declined>> altered mental status, word salad and right facial droop >> nonverbal. STAT head CT was negative. MRI pending. Neurology following.   3. Code Status: Patient is currently Full Code. Primary team will readdress when family returns.   Janee Morn, PA-C 12/27/2015, 7:39 AM Pager: [phone]   devastating neurological event  With MRI suggesting multiple watershed infarcts potentially associated with hypotension.   She was found down at approximately 1800 hrs.; initial troponin at 2135 hrs. Was normal. I think this makes it unlikely that a myocardial event was associated with an arrhythmia that triggered the stroke.   I'm not sure unfortunately however that it matters at this juncture as well outlined above by BS -PA-C as she is in no shape to undergo  invasive cardiac evaluation. Treatment with aspirin and heparin and statins is reasonable.   We will follow at a distance.  Echo P

## 2015-12-27 NOTE — Care Management Note (Signed)
Case Management Note  Patient Details  Name: Laura Parks MRN: [id number] Date of Birth: [birth year]  Subjective/Objective:                    Action/Plan: Patient was admitted with CVA, elevated troponin. Admitted from home. Will follow for discharge needs pending PT/OT evals and physician orders.  Expected Discharge Date:                  Expected Discharge Plan:     In-House Referral:     Discharge planning Services     Post Acute Care Choice:    Choice offered to:     DME Arranged:    DME Agency:     HH Arranged:    HH Agency:     Status of Service:  In process, will continue to follow  Medicare Important Message Given:    Date Medicare IM Given:    Medicare IM give by:    Date Additional Medicare IM Given:    Additional Medicare Important Message give by:     If discussed at [place] of Stay Meetings, dates discussed:    Additional Comments:  Rolm Baptise, RN 12/27/2015, 10:57 AM [phone]

## 2015-12-27 NOTE — Progress Notes (Signed)
Patient HR went into the 140-150's. [facility] p[facility]. 5 mg IV Metoprolol and an additional 2.5 mg were given per orders. Current HR is in the 70's.

## 2015-12-27 NOTE — Progress Notes (Signed)
*  PRELIMINARY RESULTS* Vascular Ultrasound Lower extremity venous duplex . No evidence of DVT or baker's cyst. Carotid Duplex. Bilateral: No significant (1-39%) ICA stenosis. Antegrade vertebral flow.    Landry Mellow, RDMS, RVT  12/27/2015, 3:05 PM

## 2015-12-27 NOTE — Progress Notes (Signed)
Inpatient Diabetes Program Recommendations  AACE/ADA: New Consensus Statement on Inpatient Glycemic Control (2015)  Target Ranges:  Prepandial:   less than 140 mg/dL      Peak postprandial:   less than 180 mg/dL (1-2 hours)      Critically ill patients:  140 - 180 mg/dL   Results for INDY, BITNER (MRN [id number]) as of 12/27/2015 12:35  Ref. Range 12/26/2015 22:24 12/27/2015 04:38 12/27/2015 07:57 12/27/2015 11:22  Glucose-Capillary Latest Ref Range: 65-99 mg/dL 154 (H) 264 (H) 220 (H) 257 (H)   Review of Glycemic Control  Diabetes history: DM 2 Outpatient Diabetes medications: Actos 15 mg Daily, Metformin 1,000 mg BID Current orders for Inpatient glycemic control: Novolog Sensitive Q4hrs.  Inpatient Diabetes Program Recommendations: Correction (SSI): Glucose in the 200's. Please consider increasing Novolog to Moderate correction.  Thanks,  Tama Headings RN, MSN, [facility], In Inpatient Diabetes Coordinator Team Pager [phone] (8a-5p)

## 2015-12-27 NOTE — Progress Notes (Signed)
Patient received from the ER at 0305, mildly sedated and not following commands. Initial NIHSS delayed as patient would not follow any command. VSS. Cardiac monitoring initiated. Will monitor closely overnight.

## 2015-12-27 NOTE — Progress Notes (Signed)
Patient ID: LARIZA ZELTNER, female   DOB: 02-11-1930, 82 y.o.   MRN: [id number]    PROGRESS NOTE    DETA DIPASQUALE  [id number] DOB: 17-Aug-1930 DOA: 12/26/2015  PCP: Glenda Chroman., MD   Outpatient Specialists:   Brief Narrative:   82 y.o. female with a history of type 2 diabetes mellitus, colon cancer, hypertension, hyperlipidemia, macular degeneration with blindness and an equivocal history of cognitive impairment, brought to the emergency room for evaluation of confusion and slurred speech that was noted several hours prior to this admission. She was unable to provide any history at the time of the admission and while in ED she has vomited multiple times. CT scan of her head showed no acute intracranial abnormality. She has remained lethargic throughout the ED evaluation. Code stroke was subsequently canceled, as acute aphasia was not clear and patient had no clear focal motor deficits.  Assessment & Plan:   Bilateral, infra and supra-tentorial infarcts, c/w cardioembolic infarcts secondary to either atrial fibrillation vs acute MI - Resultant L facial, L arm and leg weakness - MRI Acute bilateral infra- and supratentorial small infarcts - Carotid Doppler and ECHO pending - currently on IV Heparin  - will need SLP/PT/OT once more medically stable  - appreciate neurology team following   Elevated tropoinin Acute coronary syndrome vs atrial fibrillation  - Troponin 0.05 on arrival, up to 7.77 --> 19 --> 32 - cardiology following  Hypertension, essential  - BP on low end of normal   Diabetes, type II, on oral antihyperglycemics - on Actos at home - here on SSI while NPO  Acute kidney injury - from acute illness, pre renal etiology - BMP in AM  Hyperkalemia - resolved    DVT prophylaxis: On Heparin drip  Code Status: Full  Family Communication: Called family and left message  Disposition Plan: Unable to determine at this time   Consultants:    Cardiology  Neurology   Procedures:   None   Antimicrobials:   None  Subjective: Obtunded.   Objective: Filed Vitals:   12/27/15 0734 12/27/15 1000 12/27/15 1031 12/27/15 1231  BP: 103/59  133/76 107/57  Pulse: 71     Temp:      TempSrc:      Resp:      Height:  5\' 2"  (1.575 m)    Weight:      SpO2: 99%  99% 99%    Intake/Output Summary (Last 24 hours) at 12/27/15 1618 Last data filed at 12/26/15 2225  Gross per 24 hour  Intake      0 ml  Output     10 ml  Net    -10 ml   Filed Weights   12/26/15 2206 12/27/15 0317  Weight: 58.968 kg (130 lb) 67.4 kg (148 lb 9.4 oz)    Examination:  General exam: Lethargic, unable to arouse  Respiratory system: Poor inspiratory effort, diminished breath sounds at bases  Cardiovascular system: S1 & S2 heard, RRR. No JVD, murmurs Gastrointestinal system: Abdomen is nondistended, soft and nontender. No organomegaly or masses felt. Normal bowel sounds heard. Central nervous system: Lethargic, unable to open eyes, not responding to verbal commands  Skin: No rashes, lesions or ulcers  Data Reviewed: I have personally reviewed following labs and imaging studies  CBC:  Recent Labs Lab 12/26/15 2137 12/26/15 2141  WBC  --  9.9  NEUTROABS  --  4.8  HGB 12.9 11.8*  HCT 38.0 35.5*  MCV  --  86.4  PLT  --  [id number]*   Basic Metabolic Panel:  Recent Labs Lab 12/26/15 2137 12/26/15 2141 12/26/15 2248  NA 139 140 140  K 5.2* 5.3* 5.0  CL 107 106 107  CO2  --  21* 20*  GLUCOSE 168* 184* 231*  BUN 33* 21* 23*  CREATININE 1.20* 1.37* 1.39*  CALCIUM  --  9.3 8.9   Liver Function Tests:  Recent Labs Lab 12/26/15 2141 12/26/15 2248  AST 27 23  ALT 16 14  ALKPHOS 84 80  BILITOT 0.9 0.8  PROT 6.9 6.5  ALBUMIN 4.0 3.6   Coagulation Profile:  Recent Labs Lab 12/26/15 2141  INR 0.94   Cardiac Enzymes:  Recent Labs Lab 12/27/15 0517 12/27/15 1134  TROPONINI 7.77* 19.80*   CBG:  Recent Labs Lab  12/26/15 2224 12/27/15 0438 12/27/15 0757 12/27/15 1122  GLUCAP 154* 264* 220* 257*   Lipid Profile:  Recent Labs  12/27/15 0517  CHOL 180  HDL 82  LDLCALC 84  TRIG 68  CHOLHDL 2.2   Urine analysis:    Component Value Date/Time   COLORURINE YELLOW 12/26/2015 [place] 12/26/2015 2225   LABSPEC 1.019 12/26/2015 2225   PHURINE 5.5 12/26/2015 2225   GLUCOSEU NEGATIVE 12/26/2015 2225   HGBUR TRACE* 12/26/2015 2225   BILIRUBINUR NEGATIVE 12/26/2015 2225   KETONESUR NEGATIVE 12/26/2015 2225   PROTEINUR >300* 12/26/2015 2225   NITRITE NEGATIVE 12/26/2015 2225   LEUKOCYTESUR NEGATIVE 12/26/2015 2225   Sepsis Labs: @LABRCNTIP (procalcitonin:4,lacticidven:4)  )No results found for this or any previous visit (from the past 240 hour(s)).    Radiology Studies: Ct Head Wo Contrast  12/26/2015  ADDENDUM REPORT: 12/26/2015 22:37 ADDENDUM: Critical Value/emergent results were called by telephone at the time of interpretation on 12/26/2015 at 10:30 pm to Dr. Nicole Kindred, neurology, who verbally acknowledged these results. Electronically Signed   By: Lowella Grip III M.D.   On: 12/26/2015 22:37  12/26/2015  CLINICAL DATA:  Confusion and agitation EXAM: CT HEAD WITHOUT CONTRAST TECHNIQUE: Contiguous axial images were obtained from the base of the skull through the vertex without intravenous contrast. COMPARISON:  Brain MRI August 09, 2014 FINDINGS: Motion artifact makes this study less than optimal. There is moderate diffuse atrophy. There is no intracranial mass, hemorrhage, extra-axial fluid collection, or midline shift. There is patchy small vessel disease in the centra semiovale bilaterally. No acute infarct is evident. The middle cerebral artery attenuation is symmetric bilaterally. IMPRESSION: Suboptimal study due to motion artifact. Atrophy with patchy periventricular small vessel disease. No acute infarct is evident. No hemorrhage or mass effect. Note that subtle early  infarct could be obscured given this degree of motion artifact, particularly in the posterior fossa region. Electronically Signed: By: Lowella Grip III M.D. On: 12/26/2015 21:59   Mr Brain Wo Contrast  12/27/2015  CLINICAL DATA:  Patient found down. Erratic behavior. Word salad, facial droop. Not recognizing family. Confusion and agitation. Hypertension. Diabetes. EXAM: MRI HEAD WITHOUT CONTRAST MRA HEAD WITHOUT CONTRAST TECHNIQUE: Multiplanar, multiecho pulse sequences of the brain and surrounding structures were obtained without intravenous contrast. Angiographic images of the head were obtained using MRA technique without contrast. COMPARISON:  CT head 12/26/2015.  MR head 08/09/2014. FINDINGS: MRI HEAD FINDINGS Multifocal subcentimeter areas of restricted diffusion throughout both cerebral hemispheres, also involving the basal ganglia, also involving BILATERAL cerebellum in a symmetric fashion, consistent with watershed infarcts. Multiple emboli are not excluded but less favored, given symmetry and multiplicity. No hemorrhage, mass lesion,  or  extra-axial fluid. Global atrophy. Chronic microvascular ischemic change. Remote lacunar infarcts. Flow voids are maintained. No midline abnormalities. Extracranial soft tissues unremarkable. BILATERAL cataract extraction. Compared with prior CT, the infarcts are not visible. Compared with prior MR, the infarcts are new. MRA HEAD FINDINGS Anterior circulation: Moderate irregularity of the cavernous ICA on the RIGHT, potential 50-75% stenosis. Mild narrowing of the supraclinoid ICA on the RIGHT. LEFT ICA demonstrates mild non stenotic irregularity throughout its skullbase and supraclinoid segments. No MCA stenosis or occlusion. Moderate irregularity of the non dominant A1 ACA on the RIGHT. Moderate irregularity of the distal MCA branches, insufficiently well-visualized to characterize stenosis versus occlusion distally. Unremarkable distal anterior cerebral  arteries. Posterior circulation: Basilar artery widely patent. Vertebrals codominant. Unremarkable posterior cerebral arteries. No visible cerebellar branch occlusion. No visible intracranial aneurysm. IMPRESSION: Multifocal areas of acute nonhemorrhagic infarction, subcentimeter in size of a symmetric nature affecting the cortex, subcortical white matter, basal ganglia, and cerebellum. These are most consistent with watershed infarcts, commonly associated with cardiac arrhythmia or hypotensive event. Shower of emboli not excluded but less favored. Atrophy and small vessel disease similar to priors. No large vessel occlusion on MRA. Potentially flow reducing stenosis of the cavernous ICA on the RIGHT, 50-75%. Electronically Signed   By: Staci Righter M.D.   On: 12/27/2015 10:09   Ct Abdomen Pelvis W Contrast  12/27/2015  CLINICAL DATA:  Vomiting.  History colon cancer. EXAM: CT ABDOMEN AND PELVIS WITH CONTRAST TECHNIQUE: Multidetector CT imaging of the abdomen and pelvis was performed using the standard protocol following bolus administration of intravenous contrast. CONTRAST:  4mL ISOVUE-300 IOPAMIDOL (ISOVUE-300) INJECTION 61% COMPARISON:  None. FINDINGS: Lower chest: Mild pleural effusion bilaterally with mild compressive atelectasis in the bases. Extensive calcification of the mitral annulus. Heart size normal Hepatobiliary: Postop cholecystectomy. Bile ducts nondilated. No liver lesion. Pancreas: Negative Spleen: Negative Adrenals/Urinary Tract: Negative for renal obstruction. No mass or stone. Urinary bladder normal. Stomach/Bowel: Negative for bowel obstruction. Moderate sigmoid diverticulosis. Negative for diverticulitis. Surgical clips in the cecum presumably from appendectomy. No evidence of acute appendicitis. Vascular/Lymphatic: Atherosclerotic calcification in the aorta and iliac arteries. No aneurysm. Negative for lymphadenopathy Reproductive: Negative for a uterine mass.  No pelvic mass. Other: No  free-fluid Musculoskeletal: Limit evaluation of the lumbar spine due to motion. Image quality degraded by motion. The patient moved significantly midway through the scan IMPRESSION: Small bilateral effusions Sigmoid diverticulosis. Negative for diverticulitis. Probable appendectomy clips in the cecum. Electronically Signed   By: Franchot Gallo M.D.   On: 12/27/2015 14:34   Dg Chest Port 1 View  12/26/2015  CLINICAL DATA:  Shortness of breath with altered mental status EXAM: PORTABLE CHEST 1 VIEW COMPARISON:  December 10, 2010 FINDINGS: There is diffuse interstitial edema. There is cardiomegaly with pulmonary venous hypertension. No airspace consolidation. No adenopathy. There is calcification in the mitral annulus. There is degenerative change in the thoracic spine. IMPRESSION: Findings consistent with congestive heart failure. No airspace consolidation. Electronically Signed   By: Lowella Grip III M.D.   On: 12/26/2015 23:33   Mr Jodene Nam Head/brain Wo Cm  12/27/2015  CLINICAL DATA:  Patient found down. Erratic behavior. Word salad, facial droop. Not recognizing family. Confusion and agitation. Hypertension. Diabetes. EXAM: MRI HEAD WITHOUT CONTRAST MRA HEAD WITHOUT CONTRAST TECHNIQUE: Multiplanar, multiecho pulse sequences of the brain and surrounding structures were obtained without intravenous contrast. Angiographic images of the head were obtained using MRA technique without contrast. COMPARISON:  CT head 12/26/2015.  MR head 08/09/2014.  FINDINGS: MRI HEAD FINDINGS Multifocal subcentimeter areas of restricted diffusion throughout both cerebral hemispheres, also involving the basal ganglia, also involving BILATERAL cerebellum in a symmetric fashion, consistent with watershed infarcts. Multiple emboli are not excluded but less favored, given symmetry and multiplicity. No hemorrhage, mass lesion,  or extra-axial fluid. Global atrophy. Chronic microvascular ischemic change. Remote lacunar infarcts. Flow voids  are maintained. No midline abnormalities. Extracranial soft tissues unremarkable. BILATERAL cataract extraction. Compared with prior CT, the infarcts are not visible. Compared with prior MR, the infarcts are new. MRA HEAD FINDINGS Anterior circulation: Moderate irregularity of the cavernous ICA on the RIGHT, potential 50-75% stenosis. Mild narrowing of the supraclinoid ICA on the RIGHT. LEFT ICA demonstrates mild non stenotic irregularity throughout its skullbase and supraclinoid segments. No MCA stenosis or occlusion. Moderate irregularity of the non dominant A1 ACA on the RIGHT. Moderate irregularity of the distal MCA branches, insufficiently well-visualized to characterize stenosis versus occlusion distally. Unremarkable distal anterior cerebral arteries. Posterior circulation: Basilar artery widely patent. Vertebrals codominant. Unremarkable posterior cerebral arteries. No visible cerebellar branch occlusion. No visible intracranial aneurysm. IMPRESSION: Multifocal areas of acute nonhemorrhagic infarction, subcentimeter in size of a symmetric nature affecting the cortex, subcortical white matter, basal ganglia, and cerebellum. These are most consistent with watershed infarcts, commonly associated with cardiac arrhythmia or hypotensive event. Shower of emboli not excluded but less favored. Atrophy and small vessel disease similar to priors. No large vessel occlusion on MRA. Potentially flow reducing stenosis of the cavernous ICA on the RIGHT, 50-75%. Electronically Signed   By: Staci Righter M.D.   On: 12/27/2015 10:09    Scheduled Meds: . antiseptic oral rinse  7 mL Mouth Rinse BID  . aspirin  300 mg Rectal Daily   Or  . aspirin  325 mg Oral Daily  . atorvastatin  10 mg Oral Daily  . cholestyramine  4 g Oral BID  . citalopram  40 mg Oral Daily  . insulin aspart  0-9 Units Subcutaneous 6 times per day  . mirtazapine  15 mg Oral QHS   Continuous Infusions: . sodium chloride 50 mL/hr at 12/27/15 0345   . heparin 800 Units/hr (12/27/15 0737)     LOS: 0 days   Time spent: 20 minutes    Faye Ramsay, MD [facility] Pager [phone]  If 7PM-7AM, please contact night-coverage [email or web address] Password [facility] 12/27/2015, 4:18 PM

## 2015-12-27 NOTE — Hospital Discharge Follow-Up (Signed)
PCP:  Glenda Chroman., MD  Cardiology [facility] Neurology Jaynee Eagles  Referring provider Ronnald Ramp   Chief Complaint: Found down  HPI: Laura Parks is a 82 y.o. female   has a past medical history of Type 2 diabetes mellitus ([place]); Colon cancer ([place]); Anemia; Essential hypertension, benign; Mixed hyperlipidemia; Macular degeneration; History of pneumonia; Iron deficiency anemia; and Cognitive impairment.   Presented with patient was found noted by her family to suddenly have erratic behavior. She was noted to have a change around 6 PM  She had word salad, right facial droop.  Patient was found to be severely altered nonverbal. Not recognizing family.  Patient has  history of depressive episode 18 months ago but she recovered and at baseline was able to take care of self was slightly forgetful.   She's been followed for this by neurology. Was shopping with family yesterday. Continues to stay active.   IN ER: CT scan of the head showing no acute evidence of CVA patient became severely nauseous vomiting extensively in emergency department. EKG showed no change from prior reacting off from baseline of 1.39 lactic acid elevated 2.04 no evidence of infection on UA chest x-ray worrisome for CHF CT scan of the abdomen was ordered given persistent nausea and vomiting  Regarding pertinent past history: Patient has history of bilateral carotid stenosis 40-59 is sent per Doppler study in 2015, EF was normal per echogram in 2014 MRI of the head done in 2015 generalized atrophy she has slightly low dose level of B-12 at 194 was started on supplements  Hospitalist was called for admission for acute encephalopathy unclear etiology sitting dehydration  Review of Systems:  Unable to provide due to patient's confusion Past Medical History: Past Medical History  Diagnosis Date  . Type 2 diabetes mellitus ([place])   . Colon cancer ([place])   . Anemia   . Essential hypertension, benign   . Mixed hyperlipidemia   .  Macular degeneration   . History of pneumonia   . Iron deficiency anemia     Erosive antral gastritis  . Cognitive impairment    Past Surgical History  Procedure Laterality Date  . Cataract extraction    . Cholecystectomy    . Appendectomy    . Right knee surgery    . Colon surgery for colon cancer      1992 by Dr Mendel Ryder  . Tubal ligation    . Colonoscopy  03/24/2012    Procedure: COLONOSCOPY;  Surgeon: Rogene Houston, MD;  [facility]: [facility];  Service: Endoscopy;  Laterality: N/A;  1200  . Esophagogastroduodenoscopy  08/04/2012    Procedure: ESOPHAGOGASTRODUODENOSCOPY (EGD);  Surgeon: Rogene Houston, MD;  Location: [facility];  Service: Endoscopy;  Laterality: N/A;  325     Medications: Prior to Admission medications   Medication Sig Start Date End Date Taking? Authorizing Provider  alendronate (FOSAMAX) 70 MG tablet Take 70 mg by mouth every 7 (seven) days. Take with a full glass of water on an empty stomach.    Historical Provider, MD  atorvastatin (LIPITOR) 10 MG tablet Take 10 mg by mouth daily.    Historical Provider, MD  cholestyramine Lucrezia Starch) 4 G packet Take 4 g by mouth 2 (two) times daily.     Historical Provider, MD  citalopram (CELEXA) 40 MG tablet Take 1 tablet (40 mg total) by mouth daily. 08/31/14   Melvenia Beam, MD  clonazePAM (KLONOPIN) 0.5 MG tablet Take 0.5 tablets (0.25 mg total) by  mouth 2 (two) times daily as needed for anxiety. Patient taking differently: Take 0.25 mg by mouth as needed for anxiety.  07/25/14   Melvenia Beam, MD  Cyanocobalamin (VITAMIN B-12) 2500 MCG SUBL Place 2,500 mcg under the tongue.    Historical Provider, MD  ergocalciferol (VITAMIN D2) 50000 UNITS capsule Take 50,000 Units by mouth once a week. On Sundays    Historical Provider, MD  ferrous sulfate 325 (65 FE) MG tablet Take 325 mg by mouth daily with breakfast.    Historical Provider, MD  furosemide (LASIX) 20 MG tablet Take 20 mg by mouth as needed.     Historical  Provider, MD  lisinopril (PRINIVIL,ZESTRIL) 40 MG tablet Take 40 mg by mouth daily.    Historical Provider, MD  metFORMIN (GLUCOPHAGE) 500 MG tablet Take 1,000 mg by mouth 2 (two) times daily.     Historical Provider, MD  metoprolol succinate (TOPROL-XL) 50 MG 24 hr tablet Take 50 mg by mouth daily. Take with or immediately following a meal.    Historical Provider, MD  pioglitazone (ACTOS) 30 MG tablet Take 15 mg by mouth daily.     Historical Provider, MD    Allergies:   Allergies  Allergen Reactions  . Codeine Nausea Only    Social History:  Ambulatory   independently  Lives at home With family     reports that she has never smoked. She has never used smokeless tobacco. She reports that she does not drink alcohol or use illicit drugs.     Family History: family history includes Alzheimer's disease in her mother; COPD in her mother; Congestive Heart Failure in her mother; Diabetes in her brother.    Physical Exam: Patient Vitals for the past 24 hrs:  BP Temp Temp src Pulse Resp SpO2 Weight  12/26/15 2215 (!) 135/113 mmHg - - 97 21 99 % -  12/26/15 2213 - 98.2 F (36.8 C) - - - - -  12/26/15 2206 137/86 mmHg 98.2 F (36.8 C) Rectal 101 23 98 % 58.968 kg (130 lb)  12/26/15 2200 113/70 mmHg - - 97 - 96 % -    1. General:  in No Acute distress 2. Psychological: somewhat lethargic not Oriented 3. Head/ENT:   Moist  Dry Mucous Membranes                          Head Non traumatic, neck supple                          Normal Dentition 4. SKIN:  decreased Skin turgor,  Skin clean Dry and intact no rash 5. Heart: Regular rate and rhythm no Murmur, Rub or gallop 6. Lungs:  no wheezes some crackles   7. Abdomen: Soft, non-tender, Non distended 8. Lower extremities: no clubbing, cyanosis, or edema 9. Neurologically appears to be moving all 4 extremities equally but does not follow commands,  10. MSK: Normal range of motion  body mass index is 23.77 kg/(m^2).   Labs on  Admission:   Results for orders placed or performed during the hospital encounter of 12/26/15 (from the past 24 hour(s))  I-stat troponin, ED (not at [facility], [facility])     Status: None   Collection Time: 12/26/15  9:35 PM  Result Value Ref Range   Troponin i, poc 0.05 0.00 - 0.08 ng/mL   Comment 3          I-Stat  Chem 8, ED  (not at [facility], [facility])     Status: Abnormal   Collection Time: 12/26/15  9:37 PM  Result Value Ref Range   Sodium 139 135 - 145 mmol/L   Potassium 5.2 (H) 3.5 - 5.1 mmol/L   Chloride 107 101 - 111 mmol/L   BUN 33 (H) 6 - 20 mg/dL   Creatinine, Ser 1.20 (H) 0.44 - 1.00 mg/dL   Glucose, Bld 168 (H) 65 - 99 mg/dL   Calcium, Ion 1.05 (L) 1.13 - 1.30 mmol/L   TCO2 23 0 - 100 mmol/L   Hemoglobin 12.9 12.0 - 15.0 g/dL   HCT 38.0 36.0 - 46.0 %  Protime-INR     Status: None   Collection Time: 12/26/15  9:41 PM  Result Value Ref Range   Prothrombin Time 12.7 11.6 - 15.2 seconds   INR 0.94 0.00 - 1.49  APTT     Status: Abnormal   Collection Time: 12/26/15  9:41 PM  Result Value Ref Range   aPTT 21 (L) 24 - 37 seconds  CBC     Status: Abnormal   Collection Time: 12/26/15  9:41 PM  Result Value Ref Range   WBC 9.9 4.0 - 10.5 K/uL   RBC 4.11 3.87 - 5.11 MIL/uL   Hemoglobin 11.8 (L) 12.0 - 15.0 g/dL   HCT 35.5 (L) 36.0 - 46.0 %   MCV 86.4 78.0 - 100.0 fL   MCH 28.7 26.0 - 34.0 pg   MCHC 33.2 30.0 - 36.0 g/dL   RDW 14.2 11.5 - 15.5 %   Platelets 147 (L) 150 - 400 K/uL  Differential     Status: Abnormal   Collection Time: 12/26/15  9:41 PM  Result Value Ref Range   Neutrophils Relative % 49 %   Neutro Abs 4.8 1.7 - 7.7 K/uL   Lymphocytes Relative 41 %   Lymphs Abs 4.1 (H) 0.7 - 4.0 K/uL   Monocytes Relative 9 %   Monocytes Absolute 0.9 0.1 - 1.0 K/uL   Eosinophils Relative 1 %   Eosinophils Absolute 0.1 0.0 - 0.7 K/uL   Basophils Relative 0 %   Basophils Absolute 0.0 0.0 - 0.1 K/uL  Comprehensive metabolic panel     Status: Abnormal   Collection Time: 12/26/15  9:41  PM  Result Value Ref Range   Sodium 140 135 - 145 mmol/L   Potassium 5.3 (H) 3.5 - 5.1 mmol/L   Chloride 106 101 - 111 mmol/L   CO2 21 (L) 22 - 32 mmol/L   Glucose, Bld 184 (H) 65 - 99 mg/dL   BUN 21 (H) 6 - 20 mg/dL   Creatinine, Ser 1.37 (H) 0.44 - 1.00 mg/dL   Calcium 9.3 8.9 - 10.3 mg/dL   Total Protein 6.9 6.5 - 8.1 g/dL   Albumin 4.0 3.5 - 5.0 g/dL   AST 27 15 - 41 U/L   ALT 16 14 - 54 U/L   Alkaline Phosphatase 84 38 - 126 U/L   Total Bilirubin 0.9 0.3 - 1.2 mg/dL   GFR calc non Af Amer 35 (L) >60 mL/min   GFR calc Af Amer 40 (L) >60 mL/min   Anion gap 13 5 - 15  I-Stat CG4 Lactic Acid, ED     Status: Abnormal   Collection Time: 12/26/15  9:54 PM  Result Value Ref Range   Lactic Acid, Venous 2.04 (HH) 0.5 - 2.0 mmol/L   Comment NOTIFIED PHYSICIAN   CBG monitoring, ED  Status: Abnormal   Collection Time: 12/26/15 10:24 PM  Result Value Ref Range   Glucose-Capillary 154 (H) 65 - 99 mg/dL  Urinalysis, Routine w reflex microscopic     Status: Abnormal   Collection Time: 12/26/15 10:25 PM  Result Value Ref Range   Color, Urine YELLOW YELLOW   APPearance CLEAR CLEAR   Specific Gravity, Urine 1.019 1.005 - 1.030   pH 5.5 5.0 - 8.0   Glucose, UA NEGATIVE NEGATIVE mg/dL   Hgb urine dipstick TRACE (A) NEGATIVE   Bilirubin Urine NEGATIVE NEGATIVE   Ketones, ur NEGATIVE NEGATIVE mg/dL   Protein, ur >300 (A) NEGATIVE mg/dL   Nitrite NEGATIVE NEGATIVE   Leukocytes, UA NEGATIVE NEGATIVE  Urine microscopic-add on     Status: Abnormal   Collection Time: 12/26/15 10:25 PM  Result Value Ref Range   Squamous Epithelial / LPF 0-5 (A) NONE SEEN   WBC, UA NONE SEEN 0 - 5 WBC/hpf   RBC / HPF 0-5 0 - 5 RBC/hpf   Bacteria, UA RARE (A) NONE SEEN   Casts HYALINE CASTS (A) NEGATIVE  Comprehensive metabolic panel     Status: Abnormal   Collection Time: 12/26/15 10:48 PM  Result Value Ref Range   Sodium 140 135 - 145 mmol/L   Potassium 5.0 3.5 - 5.1 mmol/L   Chloride 107 101 - 111  mmol/L   CO2 20 (L) 22 - 32 mmol/L   Glucose, Bld 231 (H) 65 - 99 mg/dL   BUN 23 (H) 6 - 20 mg/dL   Creatinine, Ser 1.39 (H) 0.44 - 1.00 mg/dL   Calcium 8.9 8.9 - 10.3 mg/dL   Total Protein 6.5 6.5 - 8.1 g/dL   Albumin 3.6 3.5 - 5.0 g/dL   AST 23 15 - 41 U/L   ALT 14 14 - 54 U/L   Alkaline Phosphatase 80 38 - 126 U/L   Total Bilirubin 0.8 0.3 - 1.2 mg/dL   GFR calc non Af Amer 34 (L) >60 mL/min   GFR calc Af Amer 39 (L) >60 mL/min   Anion gap 13 5 - 15    UA   no evidence of UTI  No results found for: HGBA1C  CrCl cannot be calculated (Unknown ideal weight.).  BNP (last 3 results) No results for input(s): PROBNP in the last 8760 hours.  Other results:  I have pearsonaly reviewed this: ECG REPORT  Rate:100  Rhythm: Sinus tachycardia right bundle branch block ST&T Change: No significant ischemic changes compared to prior  QTC 516  Filed Weights   12/26/15 2206  Weight: 58.968 kg (130 lb)     Cultures: No results found for: SDES, [place], CULT, REPTSTATUS   Radiological Exams on Admission: Ct Head Wo Contrast  12/26/2015  ADDENDUM REPORT: 12/26/2015 22:37 ADDENDUM: Critical Value/emergent results were called by telephone at the time of interpretation on 12/26/2015 at 10:30 pm to Dr. Nicole Kindred, neurology, who verbally acknowledged these results. Electronically Signed   By: Lowella Grip III M.D.   On: 12/26/2015 22:37  12/26/2015  CLINICAL DATA:  Confusion and agitation EXAM: CT HEAD WITHOUT CONTRAST TECHNIQUE: Contiguous axial images were obtained from the base of the skull through the vertex without intravenous contrast. COMPARISON:  Brain MRI August 09, 2014 FINDINGS: Motion artifact makes this study less than optimal. There is moderate diffuse atrophy. There is no intracranial mass, hemorrhage, extra-axial fluid collection, or midline shift. There is patchy small vessel disease in the centra semiovale bilaterally. No acute infarct is evident. The  middle cerebral  artery attenuation is symmetric bilaterally. IMPRESSION: Suboptimal study due to motion artifact. Atrophy with patchy periventricular small vessel disease. No acute infarct is evident. No hemorrhage or mass effect. Note that subtle early infarct could be obscured given this degree of motion artifact, particularly in the posterior fossa region. Electronically Signed: By: Lowella Grip III M.D. On: 12/26/2015 21:59   Dg Chest Port 1 View  12/26/2015  CLINICAL DATA:  Shortness of breath with altered mental status EXAM: PORTABLE CHEST 1 VIEW COMPARISON:  December 10, 2010 FINDINGS: There is diffuse interstitial edema. There is cardiomegaly with pulmonary venous hypertension. No airspace consolidation. No adenopathy. There is calcification in the mitral annulus. There is degenerative change in the thoracic spine. IMPRESSION: Findings consistent with congestive heart failure. No airspace consolidation. Electronically Signed   By: Lowella Grip III M.D.   On: 12/26/2015 23:33    Chart has been reviewed  Family   at  Bedside  plan of care was discussed with  Daughter Laura Parks  Assessment/Plan  82 year old female history of dementia hypertension diabetes presents with acute encephalopathy in the setting of   possible TIA/CVA  Present on Admission:  . Essential hypertension, benign allow some permissive HTN . Hyperkalemia mild monitor on telemetry. Patient has had some nausea and diarrhea expect K to go down . Acute encephalopathy - Possible TIA vs CVA. Evidence of acute kidney injury, elevated lactic acid will monitor Gentle fluids . TIA (transient ischemic attack) -  - will admit based on TIA/CVA protocol, await results of MRA/MRI, Carotid Doppler and Echo, obtain cardiac enzymes,  ECG,   Lipid panel, TSH. Order PT/OT evaluation. Will make sure patient is on antiplatelet agent.   Neurology consulted Dr. Nicole Kindred.     Marland Kitchen AKI (acute kidney injury) ([place]) - gentle hydration Abnormal  CXR showing possible CHF patient does not appear to be fluid overloaded at this point. Monitor on tele. Obtain echo, cycle CE holding off on over aggressive diuresis in the setting of possible CVA and worsening renal function. . Legal blindness - chronic   Prophylaxis: SCD  CODE STATUS:  FULL CODE  as per  family  Disposition:   likely will need placement for rehabilitation                     Other plan as per orders.  I have spent a total of 57 min on this admission   Laura Parks 12/27/2015, 2:11 AM   [facility]  Pager [phone]   after 2 AM please page floor coverage PA If 7AM-7PM, please contact the day team taking care of the patient  [email or web address]  Password [email or web address]

## 2015-12-27 NOTE — Progress Notes (Signed)
ANTICOAGULATION CONSULT NOTE - Initial Consult  Pharmacy Consult for Heparin  Indication: chest pain/ACS, elevated troponin  Allergies  Allergen Reactions  . Codeine Nausea Only   Patient Measurements: Weight: 148 lb 9.4 oz (67.4 kg)  Vital Signs: Temp: 99.5 F (37.5 C) (04/13 0317) Temp Source: Axillary (04/13 0317) BP: 103/56 mmHg (04/13 0528) Pulse Rate: 80 (04/13 0622)  Labs:  Recent Labs  12/26/15 2137 12/26/15 2141 12/26/15 2248 12/27/15 0517  HGB 12.9 11.8*  --   --   HCT 38.0 35.5*  --   --   PLT  --  147*  --   --   APTT  --  21*  --   --   LABPROT  --  12.7  --   --   INR  --  0.94  --   --   CREATININE 1.20* 1.37* 1.39*  --   TROPONINI  --   --   --  7.77*    Medical History: Past Medical History  Diagnosis Date  . Type 2 diabetes mellitus ([place])   . Colon cancer ([place])   . Anemia   . Essential hypertension, benign   . Mixed hyperlipidemia   . Macular degeneration   . History of pneumonia   . Iron deficiency anemia     Erosive antral gastritis  . Cognitive impairment     Assessment: 82 y/o F with elevated troponin, starting heparin, Hgb 11.8, mild renal dysfunction, other labs reviewed. Undergoing stroke work-up, negative so far.   Goal of Therapy:  Heparin level 0.3-0.5 units/ml, while undergoing stroke work-up, negative so far Monitor platelets by anticoagulation protocol: Yes   Plan:  -No bolus -Start heparin drip at 800 units/hr -1500 HL -Daily CBC/HL -Monitor for bleeding  Narda Bonds 12/27/2015,6:47 AM

## 2015-12-27 NOTE — ED Notes (Signed)
Attempted report 

## 2015-12-27 NOTE — Progress Notes (Signed)
Critical troponin of 7.77 called by lab. Hospitalist notified.

## 2015-12-27 NOTE — Progress Notes (Signed)
Patient transferred to [place] at 47 with all belongings.

## 2015-12-28 ENCOUNTER — Inpatient Hospital Stay (HOSPITAL_COMMUNITY): Payer: Medicare Other

## 2015-12-28 DIAGNOSIS — N179 Acute kidney failure, unspecified: Secondary | ICD-10-CM | POA: Diagnosis present

## 2015-12-28 DIAGNOSIS — R778 Other specified abnormalities of plasma proteins: Secondary | ICD-10-CM

## 2015-12-28 DIAGNOSIS — Z515 Encounter for palliative care: Secondary | ICD-10-CM

## 2015-12-28 DIAGNOSIS — R7989 Other specified abnormal findings of blood chemistry: Secondary | ICD-10-CM

## 2015-12-28 DIAGNOSIS — I214 Non-ST elevation (NSTEMI) myocardial infarction: Secondary | ICD-10-CM | POA: Diagnosis present

## 2015-12-28 DIAGNOSIS — R4182 Altered mental status, unspecified: Secondary | ICD-10-CM | POA: Diagnosis present

## 2015-12-28 DIAGNOSIS — I6789 Other cerebrovascular disease: Secondary | ICD-10-CM

## 2015-12-28 DIAGNOSIS — R400 Somnolence: Secondary | ICD-10-CM

## 2015-12-28 DIAGNOSIS — I639 Cerebral infarction, unspecified: Secondary | ICD-10-CM | POA: Diagnosis present

## 2015-12-28 LAB — CBC
HCT: 32.8 % — ABNORMAL LOW (ref 36.0–46.0)
HEMOGLOBIN: 10.4 g/dL — AB (ref 12.0–15.0)
MCH: 27.3 pg (ref 26.0–34.0)
MCHC: 31.7 g/dL (ref 30.0–36.0)
MCV: 86.1 fL (ref 78.0–100.0)
Platelets: 134 10*3/uL — ABNORMAL LOW (ref 150–400)
RBC: 3.81 MIL/uL — ABNORMAL LOW (ref 3.87–5.11)
RDW: 15 % (ref 11.5–15.5)
WBC: 16.6 10*3/uL — ABNORMAL HIGH (ref 4.0–10.5)

## 2015-12-28 LAB — HEMOGLOBIN A1C
HEMOGLOBIN A1C: 6.5 % — AB (ref 4.8–5.6)
HEMOGLOBIN A1C: 6.6 % — AB (ref 4.8–5.6)
MEAN PLASMA GLUCOSE: 143 mg/dL
Mean Plasma Glucose: 140 mg/dL

## 2015-12-28 LAB — HEPARIN LEVEL (UNFRACTIONATED)
Heparin Unfractionated: 0.57 IU/mL (ref 0.30–0.70)
Heparin Unfractionated: 0.56 IU/mL (ref 0.30–0.70)
Heparin Unfractionated: 0.34 IU/mL (ref 0.30–0.70)

## 2015-12-28 LAB — URINE CULTURE
Culture: NO GROWTH
Special Requests: NORMAL

## 2015-12-28 LAB — BASIC METABOLIC PANEL
Anion gap: 15 (ref 5–15)
BUN: 49 mg/dL — AB (ref 6–20)
CHLORIDE: 116 mmol/L — AB (ref 101–111)
CO2: 12 mmol/L — AB (ref 22–32)
CREATININE: 2.18 mg/dL — AB (ref 0.44–1.00)
Calcium: 8.5 mg/dL — ABNORMAL LOW (ref 8.9–10.3)
GFR calc Af Amer: 23 mL/min — ABNORMAL LOW (ref >60)
GFR calc non Af Amer: 20 mL/min — ABNORMAL LOW (ref >60)
Glucose, Bld: 123 mg/dL — ABNORMAL HIGH (ref 65–99)
Potassium: 4.9 mmol/L (ref 3.5–5.1)
SODIUM: 143 mmol/L (ref 135–145)

## 2015-12-28 LAB — TROPONIN I
TROPONIN I: 35.92 ng/mL — AB (ref <0.031)
TROPONIN I: 26.53 ng/mL — AB (ref <0.031)
Troponin I: 38.35 ng/mL (ref <0.031)
Troponin I: 27.25 ng/mL (ref <0.031)

## 2015-12-28 LAB — GLUCOSE, CAPILLARY
GLUCOSE-CAPILLARY: 160 mg/dL — AB (ref 65–99)
GLUCOSE-CAPILLARY: 188 mg/dL — AB (ref 65–99)
Glucose-Capillary: 140 mg/dL — ABNORMAL HIGH (ref 65–99)
Glucose-Capillary: 129 mg/dL — ABNORMAL HIGH (ref 65–99)
Glucose-Capillary: 107 mg/dL — ABNORMAL HIGH (ref 65–99)
Glucose-Capillary: 134 mg/dL — ABNORMAL HIGH (ref 65–99)

## 2015-12-28 LAB — ECHOCARDIOGRAM COMPLETE
Height: 62 in
WEIGHTICAEL: 2296.31 [oz_av]

## 2015-12-28 NOTE — Progress Notes (Signed)
OT Cancellation Note  Patient Details Name: Laura Parks MRN: [id number] DOB: [birth year]   Cancelled Treatment:    Reason Eval/Treat Not Completed: Medical issues which prohibited therapy ( per RN pt with non reactive pupils and troponins 38)  Vonita Moss   OTR/L Pager: [phone] Office: [phone] .  12/28/2015, 8:49 AM

## 2015-12-28 NOTE — Progress Notes (Signed)
Patient ID: Laura Parks, female   DOB: June 18, 1930, 82 y.o.   MRN: [id number]    PROGRESS NOTE    Laura Parks  [id number] DOB: [birth year] DOA: 12/26/2015  PCP: Glenda Chroman., MD   Outpatient Specialists:   Brief Narrative:   82 y.o. female with a history of type 2 diabetes mellitus, colon cancer, hypertension, hyperlipidemia, macular degeneration with blindness and an equivocal history of cognitive impairment, brought to the emergency room for evaluation of confusion and slurred speech that was noted several hours prior to this admission. She was unable to provide any history at the time of the admission and while in ED she has vomited multiple times. CT scan of her head showed no acute intracranial abnormality. She has remained lethargic throughout the ED evaluation. Code stroke was subsequently canceled, as acute aphasia was not clear and patient had no clear focal motor deficits.  Assessment & Plan:   Bilateral, infra and supra-tentorial infarcts, c/w cardioembolic infarcts secondary to either atrial fibrillation vs acute MI - Resultant L facial, L arm and leg weakness, still unresponsive this AM - MRI Acute bilateral infra- and supratentorial small infarcts - Carotid Doppler and ECHO pending - currently on IV Heparin  - will need SLP/PT/OT once more medically stable  - appreciate neurology team following  - concern with progressive decline, [name] consulted to address GOC   Elevated tropoinin Acute coronary syndrome vs atrial fibrillation  - Troponin 0.05 on arrival, up to 7.77 --> 19 --> 32 --> 35 - cardiology following - continue with Heparin drip   Leukocytosis - unclear etiology - no clear infectious source - UA and CXR unremarkable - CBC pending this AM, if trending up, will consider CT chest for further eval  Hypertension, essential  - reasonably stable   Diabetes, type II, on oral antihyperglycemics - on Actos at home - here on SSI while NPO  Acute kidney  injury with metabolic acidosis  - from acute illness, pre renal etiology, demand ischemia - renal US pending  - BMP in AM  Hyperkalemia - resolved   Thrombocytopenia - reactive, no signs of bleeding but since pt on Heparin drip, need to monitor closely  - CBC in AM  DVT prophylaxis: On Heparin drip  Code Status: Full  Family Communication: Called family and left message, awaiting call back Disposition Plan: Unable to determine at this time   Consultants:   Cardiology  Neurology   [name]  Procedures:   None   Antimicrobials:   None  Subjective: Obtunded.   Objective: Filed Vitals:   12/27/15 2035 12/27/15 2335 12/28/15 0358 12/28/15 0800  BP: 121/78 122/69 113/67 112/70  Pulse: 81 81 84 84  Temp: 99 F (37.2 C) 99.1 F (37.3 C) 97.4 F (36.3 C) 97.2 F (36.2 C)  TempSrc: Axillary Axillary Axillary Axillary  Resp: 28 22 22 26   Height:      Weight: 65.1 kg (143 lb 8.3 oz)     SpO2: 98% 99% 95% 93%    Intake/Output Summary (Last 24 hours) at 12/28/15 1128 Last data filed at 12/28/15 0512  Gross per 24 hour  Intake      0 ml  Output    560 ml  Net   -560 ml   Filed Weights   12/26/15 2206 12/27/15 0317 12/27/15 2035  Weight: 58.968 kg (130 lb) 67.4 kg (148 lb 9.4 oz) 65.1 kg (143 lb 8.3 oz)    Examination:  General exam: Lethargic, unable to arouse  Respiratory system: Poor inspiratory effort, diminished breath sounds at bases with dullness to percussion at bases  Cardiovascular system: S1 & S2 heard, RRR. No JVD, murmurs Gastrointestinal system: Abdomen is nondistended, soft and nontender. No organomegaly or masses felt. Normal bowel sounds heard. Central nervous system: Lethargic, unable to open eyes, not responding to verbal commands  Skin: No rashes, lesions or ulcers  Data Reviewed: I have personally reviewed following labs and imaging studies  CBC:  Recent Labs Lab 12/26/15 2137 12/26/15 2141 12/27/15 2131  WBC  --  9.9 13.4*    NEUTROABS  --  4.8 10.8*  HGB 12.9 11.8* 10.7*  HCT 38.0 35.5* 33.0*  MCV  --  86.4 85.9  PLT  --  147* 0000000*   Basic Metabolic Panel:  Recent Labs Lab 12/26/15 2137 12/26/15 2141 12/26/15 2248 12/27/15 2131  NA 139 140 140 135  K 5.2* 5.3* 5.0 5.1  CL 107 106 107 108  CO2  --  21* 20* 16*  GLUCOSE 168* 184* 231* 226*  BUN 33* 21* 23* 42*  CREATININE 1.20* 1.37* 1.39* 2.67*  CALCIUM  --  9.3 8.9 8.4*   Liver Function Tests:  Recent Labs Lab 12/26/15 2141 12/26/15 2248 12/27/15 2131  AST 27 23 98*  ALT 16 14 35  ALKPHOS 84 80 63  BILITOT 0.9 0.8 0.8  PROT 6.9 6.5 6.1*  ALBUMIN 4.0 3.6 3.4*   Coagulation Profile:  Recent Labs Lab 12/26/15 2141  INR 0.94   Cardiac Enzymes:  Recent Labs Lab 12/27/15 1134 12/27/15 1532 12/27/15 2131 12/28/15 0213 12/28/15 0908  TROPONINI 19.80* 32.73* 35.31* 38.35* 35.92*   CBG:  Recent Labs Lab 12/27/15 1628 12/27/15 2200 12/27/15 2332 12/28/15 0400 12/28/15 0809  GLUCAP 172* 212* 188* 160* 140*   Lipid Profile:  Recent Labs  12/27/15 0517  CHOL 180  HDL 82  LDLCALC 84  TRIG 68  CHOLHDL 2.2   Urine analysis:    Component Value Date/Time   COLORURINE YELLOW 12/26/2015 [place] 12/26/2015 2225   LABSPEC 1.019 12/26/2015 2225   PHURINE 5.5 12/26/2015 2225   GLUCOSEU NEGATIVE 12/26/2015 2225   HGBUR TRACE* 12/26/2015 2225   BILIRUBINUR NEGATIVE 12/26/2015 [place] 12/26/2015 2225   PROTEINUR >300* 12/26/2015 2225   NITRITE NEGATIVE 12/26/2015 2225   LEUKOCYTESUR NEGATIVE 12/26/2015 2225   Sepsis Labs: @LABRCNTIP (procalcitonin:4,lacticidven:4)  ) Recent Results (from the past 240 hour(s))  MRSA PCR Screening     Status: None   Collection Time: 12/27/15  9:07 PM  Result Value Ref Range Status   MRSA by PCR NEGATIVE NEGATIVE Final    Comment:        The GeneXpert MRSA Assay (FDA approved for NASAL specimens only), is one component of a comprehensive MRSA  colonization surveillance program. It is not intended to diagnose MRSA infection nor to guide or monitor treatment for MRSA infections.       Radiology Studies: Ct Head Wo Contrast  12/26/2015  ADDENDUM REPORT: 12/26/2015 22:37 ADDENDUM: Critical Value/emergent results were called by telephone at the time of interpretation on 12/26/2015 at 10:30 pm to Dr. Nicole Kindred, neurology, who verbally acknowledged these results. Electronically Signed   By: Lowella Grip III M.D.   On: 12/26/2015 22:37  12/26/2015  CLINICAL DATA:  Confusion and agitation EXAM: CT HEAD WITHOUT CONTRAST TECHNIQUE: Contiguous axial images were obtained from the base of the skull through the vertex without intravenous contrast. COMPARISON:  Brain MRI August 09, 2014  FINDINGS: Motion artifact makes this study less than optimal. There is moderate diffuse atrophy. There is no intracranial mass, hemorrhage, extra-axial fluid collection, or midline shift. There is patchy small vessel disease in the centra semiovale bilaterally. No acute infarct is evident. The middle cerebral artery attenuation is symmetric bilaterally. IMPRESSION: Suboptimal study due to motion artifact. Atrophy with patchy periventricular small vessel disease. No acute infarct is evident. No hemorrhage or mass effect. Note that subtle early infarct could be obscured given this degree of motion artifact, particularly in the posterior fossa region. Electronically Signed: By: Lowella Grip III M.D. On: 12/26/2015 21:59   Mr Brain Wo Contrast  12/27/2015  CLINICAL DATA:  Patient found down. Erratic behavior. Word salad, facial droop. Not recognizing family. Confusion and agitation. Hypertension. Diabetes. EXAM: MRI HEAD WITHOUT CONTRAST MRA HEAD WITHOUT CONTRAST TECHNIQUE: Multiplanar, multiecho pulse sequences of the brain and surrounding structures were obtained without intravenous contrast. Angiographic images of the head were obtained using MRA technique without  contrast. COMPARISON:  CT head 12/26/2015.  MR head 08/09/2014. FINDINGS: MRI HEAD FINDINGS Multifocal subcentimeter areas of restricted diffusion throughout both cerebral hemispheres, also involving the basal ganglia, also involving BILATERAL cerebellum in a symmetric fashion, consistent with watershed infarcts. Multiple emboli are not excluded but less favored, given symmetry and multiplicity. No hemorrhage, mass lesion,  or extra-axial fluid. Global atrophy. Chronic microvascular ischemic change. Remote lacunar infarcts. Flow voids are maintained. No midline abnormalities. Extracranial soft tissues unremarkable. BILATERAL cataract extraction. Compared with prior CT, the infarcts are not visible. Compared with prior MR, the infarcts are new. MRA HEAD FINDINGS Anterior circulation: Moderate irregularity of the cavernous ICA on the RIGHT, potential 50-75% stenosis. Mild narrowing of the supraclinoid ICA on the RIGHT. LEFT ICA demonstrates mild non stenotic irregularity throughout its skullbase and supraclinoid segments. No MCA stenosis or occlusion. Moderate irregularity of the non dominant A1 ACA on the RIGHT. Moderate irregularity of the distal MCA branches, insufficiently well-visualized to characterize stenosis versus occlusion distally. Unremarkable distal anterior cerebral arteries. Posterior circulation: Basilar artery widely patent. Vertebrals codominant. Unremarkable posterior cerebral arteries. No visible cerebellar branch occlusion. No visible intracranial aneurysm. IMPRESSION: Multifocal areas of acute nonhemorrhagic infarction, subcentimeter in size of a symmetric nature affecting the cortex, subcortical white matter, basal ganglia, and cerebellum. These are most consistent with watershed infarcts, commonly associated with cardiac arrhythmia or hypotensive event. Shower of emboli not excluded but less favored. Atrophy and small vessel disease similar to priors. No large vessel occlusion on MRA.  Potentially flow reducing stenosis of the cavernous ICA on the RIGHT, 50-75%. Electronically Signed   By: Staci Righter M.D.   On: 12/27/2015 10:09   Ct Abdomen Pelvis W Contrast  12/27/2015  CLINICAL DATA:  Vomiting.  History colon cancer. EXAM: CT ABDOMEN AND PELVIS WITH CONTRAST TECHNIQUE: Multidetector CT imaging of the abdomen and pelvis was performed using the standard protocol following bolus administration of intravenous contrast. CONTRAST:  65mL ISOVUE-300 IOPAMIDOL (ISOVUE-300) INJECTION 61% COMPARISON:  None. FINDINGS: Lower chest: Mild pleural effusion bilaterally with mild compressive atelectasis in the bases. Extensive calcification of the mitral annulus. Heart size normal Hepatobiliary: Postop cholecystectomy. Bile ducts nondilated. No liver lesion. Pancreas: Negative Spleen: Negative Adrenals/Urinary Tract: Negative for renal obstruction. No mass or stone. Urinary bladder normal. Stomach/Bowel: Negative for bowel obstruction. Moderate sigmoid diverticulosis. Negative for diverticulitis. Surgical clips in the cecum presumably from appendectomy. No evidence of acute appendicitis. Vascular/Lymphatic: Atherosclerotic calcification in the aorta and iliac arteries. No aneurysm. Negative for lymphadenopathy  Reproductive: Negative for a uterine mass.  No pelvic mass. Other: No free-fluid Musculoskeletal: Limit evaluation of the lumbar spine due to motion. Image quality degraded by motion. The patient moved significantly midway through the scan IMPRESSION: Small bilateral effusions Sigmoid diverticulosis. Negative for diverticulitis. Probable appendectomy clips in the cecum. Electronically Signed   By: Franchot Gallo M.D.   On: 12/27/2015 14:34   Dg Chest Port 1 View  12/26/2015  CLINICAL DATA:  Shortness of breath with altered mental status EXAM: PORTABLE CHEST 1 VIEW COMPARISON:  December 10, 2010 FINDINGS: There is diffuse interstitial edema. There is cardiomegaly with pulmonary venous hypertension. No  airspace consolidation. No adenopathy. There is calcification in the mitral annulus. There is degenerative change in the thoracic spine. IMPRESSION: Findings consistent with congestive heart failure. No airspace consolidation. Electronically Signed   By: Lowella Grip III M.D.   On: 12/26/2015 23:33   Mr Laura Parks Head/brain Wo Cm  12/27/2015  CLINICAL DATA:  Patient found down. Erratic behavior. Word salad, facial droop. Not recognizing family. Confusion and agitation. Hypertension. Diabetes. EXAM: MRI HEAD WITHOUT CONTRAST MRA HEAD WITHOUT CONTRAST TECHNIQUE: Multiplanar, multiecho pulse sequences of the brain and surrounding structures were obtained without intravenous contrast. Angiographic images of the head were obtained using MRA technique without contrast. COMPARISON:  CT head 12/26/2015.  MR head 08/09/2014. FINDINGS: MRI HEAD FINDINGS Multifocal subcentimeter areas of restricted diffusion throughout both cerebral hemispheres, also involving the basal ganglia, also involving BILATERAL cerebellum in a symmetric fashion, consistent with watershed infarcts. Multiple emboli are not excluded but less favored, given symmetry and multiplicity. No hemorrhage, mass lesion,  or extra-axial fluid. Global atrophy. Chronic microvascular ischemic change. Remote lacunar infarcts. Flow voids are maintained. No midline abnormalities. Extracranial soft tissues unremarkable. BILATERAL cataract extraction. Compared with prior CT, the infarcts are not visible. Compared with prior MR, the infarcts are new. MRA HEAD FINDINGS Anterior circulation: Moderate irregularity of the cavernous ICA on the RIGHT, potential 50-75% stenosis. Mild narrowing of the supraclinoid ICA on the RIGHT. LEFT ICA demonstrates mild non stenotic irregularity throughout its skullbase and supraclinoid segments. No MCA stenosis or occlusion. Moderate irregularity of the non dominant A1 ACA on the RIGHT. Moderate irregularity of the distal MCA branches,  insufficiently well-visualized to characterize stenosis versus occlusion distally. Unremarkable distal anterior cerebral arteries. Posterior circulation: Basilar artery widely patent. Vertebrals codominant. Unremarkable posterior cerebral arteries. No visible cerebellar branch occlusion. No visible intracranial aneurysm. IMPRESSION: Multifocal areas of acute nonhemorrhagic infarction, subcentimeter in size of a symmetric nature affecting the cortex, subcortical white matter, basal ganglia, and cerebellum. These are most consistent with watershed infarcts, commonly associated with cardiac arrhythmia or hypotensive event. Shower of emboli not excluded but less favored. Atrophy and small vessel disease similar to priors. No large vessel occlusion on MRA. Potentially flow reducing stenosis of the cavernous ICA on the RIGHT, 50-75%. Electronically Signed   By: Staci Righter M.D.   On: 12/27/2015 10:09    Scheduled Meds: . antiseptic oral rinse  7 mL Mouth Rinse BID  . aspirin  300 mg Rectal Daily   Or  . aspirin  325 mg Oral Daily  . atorvastatin  10 mg Oral Daily  . cholestyramine  4 g Oral BID  . citalopram  40 mg Oral Daily  . insulin aspart  0-9 Units Subcutaneous 6 times per day  . mirtazapine  15 mg Oral QHS   Continuous Infusions: . sodium chloride 50 mL/hr at 12/28/15 0700  . heparin 800 Units/hr (  12/28/15 0700)     LOS: 1 day   Time spent: 20 minutes    Faye Ramsay, MD [facility] Pager [phone]  If 7PM-7AM, please contact night-coverage [email or web address] Password [facility] 12/28/2015, 11:28 AM

## 2015-12-28 NOTE — Progress Notes (Signed)
Patient Name:  Laura Parks, DOB: 04/27/1930, MRN: [id number] Primary Doctor: Glenda Chroman., MD Primary Cardiologist:   Date: 12/28/2015   SUBJECTIVE:   Full cardiology consultation was done yesterday. There is elevated troponin. It is not clear that this is cardiac related. It was felt that cardiac catheterization would not be indicated yesterday as the patient's mental status is abnormal. Mental status remains abnormal. So far normal sinus rhythm has been seen.    Past Medical History  Diagnosis Date  . Type 2 diabetes mellitus ([place])   . Colon cancer ([place])   . Anemia   . Essential hypertension, benign   . Mixed hyperlipidemia   . Macular degeneration   . History of pneumonia   . Iron deficiency anemia     Erosive antral gastritis  . Cognitive impairment    Filed Vitals:   12/27/15 1826 12/27/15 2035 12/27/15 2335 12/28/15 0358  BP: 128/79 121/78 122/69 113/67  Pulse: 81 81 81 84  Temp: 98 F (36.7 C) 99 F (37.2 C) 99.1 F (37.3 C) 97.4 F (36.3 C)  TempSrc: Oral Axillary Axillary Axillary  Resp: 20 28 22 22   Height:      Weight:  143 lb 8.3 oz (65.1 kg)    SpO2: 100% 98% 99% 95%    Intake/Output Summary (Last 24 hours) at 12/28/15 0720 Last data filed at 12/28/15 0512  Gross per 24 hour  Intake      0 ml  Output    560 ml  Net   -560 ml   Filed Weights   12/26/15 2206 12/27/15 0317 12/27/15 2035  Weight: 130 lb (58.968 kg) 148 lb 9.4 oz (67.4 kg) 143 lb 8.3 oz (65.1 kg)     LABS: Basic Metabolic Panel:  Recent Labs  12/26/15 2248 12/27/15 2131  NA 140 135  K 5.0 5.1  CL 107 108  CO2 20* 16*  GLUCOSE 231* 226*  BUN 23* 42*  CREATININE 1.39* 2.67*  CALCIUM 8.9 8.4*   Liver Function Tests:  Recent Labs  12/26/15 2248 12/27/15 2131  AST 23 98*  ALT 14 35  ALKPHOS 80 63  BILITOT 0.8 0.8  PROT 6.5 6.1*  ALBUMIN 3.6 3.4*   No results for input(s): LIPASE, AMYLASE in the last 72 hours. CBC:  Recent Labs  12/26/15 2141  12/27/15 2131  WBC 9.9 13.4*  NEUTROABS 4.8 10.8*  HGB 11.8* 10.7*  HCT 35.5* 33.0*  MCV 86.4 85.9  PLT 147* 137*   Cardiac Enzymes:  Recent Labs  12/27/15 1532 12/27/15 2131 12/28/15 0213  TROPONINI 32.73* 35.31* 38.35*   BNP: Invalid input(s): POCBNP D-Dimer: No results for input(s): DDIMER in the last 72 hours. Thyroid Function Tests: No results for input(s): TSH, T4TOTAL, T3FREE, THYROIDAB in the last 72 hours.  Invalid input(s): FREET3  RADIOLOGY: Ct Head Wo Contrast  12/26/2015  ADDENDUM REPORT: 12/26/2015 22:37 ADDENDUM: Critical Value/emergent results were called by telephone at the time of interpretation on 12/26/2015 at 10:30 pm to Dr. Nicole Kindred, neurology, who verbally acknowledged these results. Electronically Signed   By: Lowella Grip III M.D.   On: 12/26/2015 22:37  12/26/2015  CLINICAL DATA:  Confusion and agitation EXAM: CT HEAD WITHOUT CONTRAST TECHNIQUE: Contiguous axial images were obtained from the base of the skull through the vertex without intravenous contrast. COMPARISON:  Brain MRI August 09, 2014 FINDINGS: Motion artifact makes this study less than optimal. There is moderate diffuse atrophy. There is no intracranial mass, hemorrhage,  extra-axial fluid collection, or midline shift. There is patchy small vessel disease in the centra semiovale bilaterally. No acute infarct is evident. The middle cerebral artery attenuation is symmetric bilaterally. IMPRESSION: Suboptimal study due to motion artifact. Atrophy with patchy periventricular small vessel disease. No acute infarct is evident. No hemorrhage or mass effect. Note that subtle early infarct could be obscured given this degree of motion artifact, particularly in the posterior fossa region. Electronically Signed: By: Lowella Grip III M.D. On: 12/26/2015 21:59   Mr Brain Wo Contrast  12/27/2015  CLINICAL DATA:  Patient found down. Erratic behavior. Word salad, facial droop. Not recognizing family.  Confusion and agitation. Hypertension. Diabetes. EXAM: MRI HEAD WITHOUT CONTRAST MRA HEAD WITHOUT CONTRAST TECHNIQUE: Multiplanar, multiecho pulse sequences of the brain and surrounding structures were obtained without intravenous contrast. Angiographic images of the head were obtained using MRA technique without contrast. COMPARISON:  CT head 12/26/2015.  MR head 08/09/2014. FINDINGS: MRI HEAD FINDINGS Multifocal subcentimeter areas of restricted diffusion throughout both cerebral hemispheres, also involving the basal ganglia, also involving BILATERAL cerebellum in a symmetric fashion, consistent with watershed infarcts. Multiple emboli are not excluded but less favored, given symmetry and multiplicity. No hemorrhage, mass lesion,  or extra-axial fluid. Global atrophy. Chronic microvascular ischemic change. Remote lacunar infarcts. Flow voids are maintained. No midline abnormalities. Extracranial soft tissues unremarkable. BILATERAL cataract extraction. Compared with prior CT, the infarcts are not visible. Compared with prior MR, the infarcts are new. MRA HEAD FINDINGS Anterior circulation: Moderate irregularity of the cavernous ICA on the RIGHT, potential 50-75% stenosis. Mild narrowing of the supraclinoid ICA on the RIGHT. LEFT ICA demonstrates mild non stenotic irregularity throughout its skullbase and supraclinoid segments. No MCA stenosis or occlusion. Moderate irregularity of the non dominant A1 ACA on the RIGHT. Moderate irregularity of the distal MCA branches, insufficiently well-visualized to characterize stenosis versus occlusion distally. Unremarkable distal anterior cerebral arteries. Posterior circulation: Basilar artery widely patent. Vertebrals codominant. Unremarkable posterior cerebral arteries. No visible cerebellar branch occlusion. No visible intracranial aneurysm. IMPRESSION: Multifocal areas of acute nonhemorrhagic infarction, subcentimeter in size of a symmetric nature affecting the cortex,  subcortical white matter, basal ganglia, and cerebellum. These are most consistent with watershed infarcts, commonly associated with cardiac arrhythmia or hypotensive event. Shower of emboli not excluded but less favored. Atrophy and small vessel disease similar to priors. No large vessel occlusion on MRA. Potentially flow reducing stenosis of the cavernous ICA on the RIGHT, 50-75%. Electronically Signed   By: Staci Righter M.D.   On: 12/27/2015 10:09   Ct Abdomen Pelvis W Contrast  12/27/2015  CLINICAL DATA:  Vomiting.  History colon cancer. EXAM: CT ABDOMEN AND PELVIS WITH CONTRAST TECHNIQUE: Multidetector CT imaging of the abdomen and pelvis was performed using the standard protocol following bolus administration of intravenous contrast. CONTRAST:  13mL ISOVUE-300 IOPAMIDOL (ISOVUE-300) INJECTION 61% COMPARISON:  None. FINDINGS: Lower chest: Mild pleural effusion bilaterally with mild compressive atelectasis in the bases. Extensive calcification of the mitral annulus. Heart size normal Hepatobiliary: Postop cholecystectomy. Bile ducts nondilated. No liver lesion. Pancreas: Negative Spleen: Negative Adrenals/Urinary Tract: Negative for renal obstruction. No mass or stone. Urinary bladder normal. Stomach/Bowel: Negative for bowel obstruction. Moderate sigmoid diverticulosis. Negative for diverticulitis. Surgical clips in the cecum presumably from appendectomy. No evidence of acute appendicitis. Vascular/Lymphatic: Atherosclerotic calcification in the aorta and iliac arteries. No aneurysm. Negative for lymphadenopathy Reproductive: Negative for a uterine mass.  No pelvic mass. Other: No free-fluid Musculoskeletal: Limit evaluation of the lumbar spine  due to motion. Image quality degraded by motion. The patient moved significantly midway through the scan IMPRESSION: Small bilateral effusions Sigmoid diverticulosis. Negative for diverticulitis. Probable appendectomy clips in the cecum. Electronically Signed   By:  Franchot Gallo M.D.   On: 12/27/2015 14:34   Dg Chest Port 1 View  12/26/2015  CLINICAL DATA:  Shortness of breath with altered mental status EXAM: PORTABLE CHEST 1 VIEW COMPARISON:  December 10, 2010 FINDINGS: There is diffuse interstitial edema. There is cardiomegaly with pulmonary venous hypertension. No airspace consolidation. No adenopathy. There is calcification in the mitral annulus. There is degenerative change in the thoracic spine. IMPRESSION: Findings consistent with congestive heart failure. No airspace consolidation. Electronically Signed   By: Lowella Grip III M.D.   On: 12/26/2015 23:33   Mr Jodene Nam Head/brain Wo Cm  12/27/2015  CLINICAL DATA:  Patient found down. Erratic behavior. Word salad, facial droop. Not recognizing family. Confusion and agitation. Hypertension. Diabetes. EXAM: MRI HEAD WITHOUT CONTRAST MRA HEAD WITHOUT CONTRAST TECHNIQUE: Multiplanar, multiecho pulse sequences of the brain and surrounding structures were obtained without intravenous contrast. Angiographic images of the head were obtained using MRA technique without contrast. COMPARISON:  CT head 12/26/2015.  MR head 08/09/2014. FINDINGS: MRI HEAD FINDINGS Multifocal subcentimeter areas of restricted diffusion throughout both cerebral hemispheres, also involving the basal ganglia, also involving BILATERAL cerebellum in a symmetric fashion, consistent with watershed infarcts. Multiple emboli are not excluded but less favored, given symmetry and multiplicity. No hemorrhage, mass lesion,  or extra-axial fluid. Global atrophy. Chronic microvascular ischemic change. Remote lacunar infarcts. Flow voids are maintained. No midline abnormalities. Extracranial soft tissues unremarkable. BILATERAL cataract extraction. Compared with prior CT, the infarcts are not visible. Compared with prior MR, the infarcts are new. MRA HEAD FINDINGS Anterior circulation: Moderate irregularity of the cavernous ICA on the RIGHT, potential 50-75%  stenosis. Mild narrowing of the supraclinoid ICA on the RIGHT. LEFT ICA demonstrates mild non stenotic irregularity throughout its skullbase and supraclinoid segments. No MCA stenosis or occlusion. Moderate irregularity of the non dominant A1 ACA on the RIGHT. Moderate irregularity of the distal MCA branches, insufficiently well-visualized to characterize stenosis versus occlusion distally. Unremarkable distal anterior cerebral arteries. Posterior circulation: Basilar artery widely patent. Vertebrals codominant. Unremarkable posterior cerebral arteries. No visible cerebellar branch occlusion. No visible intracranial aneurysm. IMPRESSION: Multifocal areas of acute nonhemorrhagic infarction, subcentimeter in size of a symmetric nature affecting the cortex, subcortical white matter, basal ganglia, and cerebellum. These are most consistent with watershed infarcts, commonly associated with cardiac arrhythmia or hypotensive event. Shower of emboli not excluded but less favored. Atrophy and small vessel disease similar to priors. No large vessel occlusion on MRA. Potentially flow reducing stenosis of the cavernous ICA on the RIGHT, 50-75%. Electronically Signed   By: Staci Righter M.D.   On: 12/27/2015 10:09     TELEMETRY:   I have reviewed telemetry today December 28, 2015. There is normal sinus rhythm. No atrial fibrillation has been seen on the monitor.  ASSESSMENT AND PLAN:    Acute encephalopathy     MRI has shown multiple  Small abnormal areas. Neurology  questions  the possibility of cardiogenic emboli. Echo is pending. The patient's initial EKG does not show obvious P waves. I'm still not sure if this is an junctional rhythm or whether there was in fact some atrial fibrillation seen.    Elevated troponin    At this point, there is no definite proof that this troponin is cardiac related.  From the cardiology viewpoint, the plan will be to continue to monitor the patient's mental status. No acute coronary  intervention is indicated. 2-D echo is pending. We will review the patient's admission EKG. Over time we will have to decide if chronic anticoagulation is to be recommended from the cardiac viewpoint. Currently she is on heparin.   Dola Argyle 12/28/2015 7:20 AM

## 2015-12-28 NOTE — Progress Notes (Signed)
  Echocardiogram 2D Echocardiogram has been performed.  Donata Clay 12/28/2015, 12:18 PM

## 2015-12-28 NOTE — Evaluation (Signed)
Clinical/Bedside Swallow Evaluation Patient Details  Name: Laura Parks MRN: [id number] Date of Birth: 10-12-1929  Today's Date: 12/28/2015 Time: SLP Start Time (ACUTE ONLY): 54 SLP Stop Time (ACUTE ONLY): 1030 SLP Time Calculation (min) (ACUTE ONLY): 25 min  Past Medical History:  Past Medical History  Diagnosis Date  . Type 2 diabetes mellitus ([place])   . Colon cancer ([place])   . Anemia   . Essential hypertension, benign   . Mixed hyperlipidemia   . Macular degeneration   . History of pneumonia   . Iron deficiency anemia     Erosive antral gastritis  . Cognitive impairment    Past Surgical History:  Past Surgical History  Procedure Laterality Date  . Cataract extraction    . Cholecystectomy    . Appendectomy    . Right knee surgery    . Colon surgery for colon cancer      1992 by Dr Mendel Ryder  . Tubal ligation    . Colonoscopy  03/24/2012    Procedure: COLONOSCOPY;  Surgeon: Rogene Houston, MD;  Location: [facility];  Service: Endoscopy;  Laterality: N/A;  1200  . Esophagogastroduodenoscopy  08/04/2012    Procedure: ESOPHAGOGASTRODUODENOSCOPY (EGD);  Surgeon: Rogene Houston, MD;  Location: [facility];  Service: Endoscopy;  Laterality: N/A;  78   HPI:  82 y.o. female with h/o DM type 2, macular degeneration, HTN, HLD, pneumonia, colon cancer and cognitive impairment, who presented to ED as code stroke after reported fall. When found, pt was nonverbal with AMS. Per MD note, pt unable to communicate normally at baseline. MR Brain 4/13 multifocal areas of acute nonhemorrhagic infarction, subcentimeter in size of symmetric nature affecting cortex, subcortical white matter, basal ganglia and cerebellum. These are most consistent with watershed infarcts, commonly associated with cardiac arrhythmia or hypotensive event. Shower of emboli not excluded but less favored. CXR 4/12 findings consistent with congestive heart failure. No airspace consolidation.   Assessment / Plan /  Recommendation Clinical Impression  Pt lethargic/drowsy upon SLP arrival and required max verbal, visual and tactile cues throughout evaluation to arouse and attend. Noted mild nasolabial fold flattening with no other asymmetry observed. Pt frequently ejected POs out of mouth, likely due to decreased cognition and increased somnolence rather than reduced labial seal. Pharyngeal swallow initiation noted during palpation with no vocal quality change after intake, which would be indicative of possible penetration/aspiration. Recommend pt remain NPO until pt able to remain alert and awake for intake. Will continue to follow to determine PO readiness.    Aspiration Risk  Moderate aspiration risk    Diet Recommendation NPO;Alternative means - temporary   Medication Administration: Via alternative means    Other  Recommendations Oral Care Recommendations: Oral care QID   Follow up Recommendations   (TBD)    Frequency and Duration min 2x/week  2 weeks       Prognosis Prognosis for Safe Diet Advancement: Fair Barriers to Reach Goals: Cognitive deficits;Behavior      Swallow Study   General HPI: 82 y.o. female with h/o DM type 2, macular degeneration, HTN, HLD, pneumonia, colon cancer and cognitive impairment, who presented to ED as code stroke after reported fall. When found, pt was nonverbal with AMS. Per MD note, pt unable to communicate normally at baseline. MR Brain 4/13 multifocal areas of acute nonhemorrhagic infarction, subcentimeter in size of symmetric nature affecting cortex, subcortical white matter, basal ganglia and cerebellum. These are most consistent with watershed infarcts, commonly associated with  cardiac arrhythmia or hypotensive event. Shower of emboli not excluded but less favored. CXR 4/12 findings consistent with congestive heart failure. No airspace consolidation. Type of Study: Bedside Swallow Evaluation Previous Swallow Assessment: none found Diet Prior to this Study:  NPO Temperature Spikes Noted: No Respiratory Status: Room air History of Recent Intubation: No Behavior/Cognition: Lethargic/Drowsy;Cooperative;Requires cueing Oral Cavity Assessment: Within Functional Limits Oral Care Completed by SLP: Recent completion by staff Oral Cavity - Dentition: Adequate natural dentition Vision:  (unable to determine) Self-Feeding Abilities: Total assist Patient Positioning: Upright in bed Baseline Vocal Quality: Normal    Oral/Motor/Sensory Function Overall Oral Motor/Sensory Function: Mild impairment Facial Symmetry: Abnormal symmetry right;Suspected CN VII (facial) dysfunction (mild) Facial Strength: Within Functional Limits Facial Sensation: Within Functional Limits Lingual ROM: Within Functional Limits Lingual Symmetry: Within Functional Limits Lingual Strength: Within Functional Limits Lingual Sensation: Within Functional Limits Velum: Within Functional Limits Mandible: Within Functional Limits   Ice Chips Ice chips: Impaired Presentation: Spoon Oral Phase Impairments: Reduced labial seal Oral Phase Functional Implications: Left anterior spillage;Oral holding Pharyngeal Phase Impairments:  (none)   Thin Liquid Thin Liquid: Impaired Presentation: Spoon Oral Phase Impairments: Reduced labial seal Oral Phase Functional Implications: Left anterior spillage Pharyngeal  Phase Impairments:  (none)    Nectar Thick Nectar Thick Liquid: Not tested   Honey Thick Honey Thick Liquid: Not tested   Puree Puree: Not tested   Solid  Kammi Hechler, Student-SLP Solid: Not tested        Titus Mould 12/28/2015,10:57 AM

## 2015-12-28 NOTE — Progress Notes (Signed)
ANTICOAGULATION CONSULT NOTE - Initial Consult  Pharmacy Consult for Heparin  Indication: chest pain/ACS, elevated troponin  Allergies  Allergen Reactions  . Codeine Nausea Only   Patient Measurements: Height: 5\' 2"  (157.5 cm) Weight: 143 lb 8.3 oz (65.1 kg) IBW/kg (Calculated) : 50.1  Vital Signs: Temp: 98.9 F (37.2 C) (04/14 2000) Temp Source: Axillary (04/14 2000) BP: 118/70 mmHg (04/14 2000) Pulse Rate: 84 (04/14 2000)  Labs:  Recent Labs  12/26/15 2141 12/26/15 2248  12/27/15 2131 12/28/15 0213 12/28/15 0908 12/28/15 1125 12/28/15 1500 12/28/15 2154  HGB 11.8*  --   --  10.7*  --   --   --  10.4*  --   HCT 35.5*  --   --  33.0*  --   --   --  32.8*  --   PLT 147*  --   --  137*  --   --   --  134*  --   APTT 21*  --   --   --   --   --   --   --   --   LABPROT 12.7  --   --   --   --   --   --   --   --   INR 0.94  --   --   --   --   --   --   --   --   HEPARINUNFRC  --   --   < >  --  0.57  --  0.56  --  0.34  CREATININE 1.37* 1.39*  --  2.67*  --   --   --  2.18*  --   TROPONINI  --   --   < > 35.31* 38.35* 35.92*  --  26.53* 27.25*  < > = values in this interval not displayed.  Medical History: Past Medical History  Diagnosis Date  . Type 2 diabetes mellitus ([place])   . Colon cancer ([place])   . Anemia   . Essential hypertension, benign   . Mixed hyperlipidemia   . Macular degeneration   . History of pneumonia   . Iron deficiency anemia     Erosive antral gastritis  . Cognitive impairment     Assessment: 82 y/o F with elevated troponin, on heparin, troponin peaked and trending down, HL therapeutic x 1 after rate increase  Goal of Therapy:  Heparin level 0.3-0.5 units/ml Monitor platelets by anticoagulation protocol: Yes   Plan:  -Cont heparin at 700 units/hr -Confirmatory HL with AM labs  Narda Bonds 12/28/2015,11:29 PM

## 2015-12-28 NOTE — Progress Notes (Signed)
SLP Cancellation Note  Patient Details Name: Laura Parks MRN: [id number] DOB: 1930-07-03   Cancelled treatment:        Pt not alert and not appropriate for bedside swallow evaluation at this time. Will continue to follow.   Titus Mould 12/28/2015, 9:07 AM

## 2015-12-28 NOTE — Consult Note (Signed)
Consultation Note Date: 12/28/2015   Patient Name: Laura Parks  DOB: 02-23-1930  MRN: [id number]  Age / Sex: 82 y.o., female  PCP: Glenda Chroman, MD Referring Physician: Theodis Blaze, MD  Reason for Consultation: Establishing goals of care    Clinical Assessment/Narrative: He start [facility] and Ceftin it smells good patient is an 82 year old female with a history of type 2 diabetes colon cancer hypertension hyperlipidemia macular degeneration, with blindness, hard of hearing who was admitted to the hospital with acute confusion and agitation. Her speech was initially described as slurred initial CT scan did not show any acute findings but later did reflect acute bilateral infarcts. She also has an elevated troponin, on 12/28/2015 value was 38.81; additionally she is showing acute kidney injury with a creatinine of 2.61. Her son, daughter, husband are at the bedside. They feel more hopeful today because she is speaking some to them.  Contacts/Participants in Discussion: Primary Decision Maker: Jhonnie Garner   Relationship to Patient daughter HCPOA: yes  This Collins's initial healthcare power of attorney, but they make decisions as a family.  SUMMARY OF RECOMMENDATIONS Family would want all aggressive treatments at this point including intubation and CPR defibrillation, pressors They would also pursue dialysis if this was a viable option to extend her life They share with me that did talk about advanced care planning in some ways feels like her being negative, which to provide the patient and family of hope. Patient herself has prepared and advanced directive but daughter is reluctant to share with me what those directives are, again, to speak in these terms is viewed as inappropriate because it is perceived as negative. She did ultimately share with me that her mother did state that she would not be want to be kept  alive on machines PEG tube placement, tracheostomy , or concepts that were introduced. Family states those decisions will we made at the time they need to be made  Code Status/Advance Care Planning: Full code    Code Status Orders        Start     Ordered   12/27/15 0321  Full code   Continuous     12/27/15 0320    Code Status History    Date Active Date Inactive Code Status Order ID Comments User Context   This patient has a current code status but no historical code status.      Other Directives:None  Symptom Management:   Pain: Patient has no prior history of chronic pain, and there are no current nonverbal signs and symptoms of pain. Continue to treat with Tylenol suppository when necessary. Anticipate the family would have some resistance to opioids at this point  Dyspnea: Respirations are snoring and there is frequent apnea, but respiratory rate is 12 and appears unlabored. Continue O2 and supportive pulmonary treatments and monitor  Palliative Prophylaxis:   Bowel Regimen, Delirium Protocol, Frequent Pain Assessment and Turn Reposition  Additional Recommendations (Limitations, Scope, Preferences):  Full Scope Treatment  Psycho-social/Spiritual:  Support System: Adequate Desire for further Chaplaincy support:no  Prognosis: Unable to determine  Discharge Planning: Unable to determine. Family is only speaking in terms of improvement, thus is unable to process discharge planning in terms of will skilled nursing facility be needed, or hospice   Chief Complaint/ Primary Diagnoses: Present on Admission:  . Essential hypertension, benign . Hyperkalemia . Acute encephalopathy . TIA (transient ischemic attack) . AKI (acute kidney injury) ([place]) . Dehydration . Legal blindness  I have  reviewed the medical record, interviewed the patient and family, and examined the patient. The following aspects are pertinent.  Past Medical History  Diagnosis Date  . Type 2  diabetes mellitus ([place])   . Colon cancer ([place])   . Anemia   . Essential hypertension, benign   . Mixed hyperlipidemia   . Macular degeneration   . History of pneumonia   . Iron deficiency anemia     Erosive antral gastritis  . Cognitive impairment    Social History   Social History  . Marital Status: Married    Spouse Name: N/A  . Number of Children: N/A  . Years of Education: N/A   Social History Main Topics  . Smoking status: Never Smoker   . Smokeless tobacco: Never Used  . Alcohol Use: No  . Drug Use: No  . Sexual Activity: Not Asked   Other Topics Concern  . None   Social History Narrative   Family History  Problem Relation Age of Onset  . COPD Mother   . Alzheimer's disease Mother   . Congestive Heart Failure Mother   . Diabetes Brother    Scheduled Meds: . antiseptic oral rinse  7 mL Mouth Rinse BID  . aspirin  300 mg Rectal Daily   Or  . aspirin  325 mg Oral Daily  . atorvastatin  10 mg Oral Daily  . cholestyramine light  4 g Oral BID  . insulin aspart  0-9 Units Subcutaneous 6 times per day   Continuous Infusions: . sodium chloride 50 mL/hr at 12/28/15 0700  . heparin 700 Units/hr (12/28/15 1436)   PRN Meds:.acetaminophen **OR** acetaminophen Medications Prior to Admission:  Prior to Admission medications   Medication Sig Start Date End Date Taking? Authorizing Provider  mirtazapine (REMERON) 15 MG tablet Take 15 mg by mouth at bedtime.   Yes Historical Provider, MD  alendronate (FOSAMAX) 70 MG tablet Take 70 mg by mouth every 7 (seven) days. Take with a full glass of water on an empty stomach.    Historical Provider, MD  atorvastatin (LIPITOR) 10 MG tablet Take 10 mg by mouth daily.    Historical Provider, MD  cholestyramine Lucrezia Starch) 4 G packet Take 4 g by mouth 2 (two) times daily.     Historical Provider, MD  citalopram (CELEXA) 40 MG tablet Take 1 tablet (40 mg total) by mouth daily. 08/31/14   Melvenia Beam, MD  Cyanocobalamin (VITAMIN  B-12) 2500 MCG SUBL Place 2,500 mcg under the tongue.    Historical Provider, MD  ergocalciferol (VITAMIN D2) 50000 UNITS capsule Take 50,000 Units by mouth once a week. On Sundays    Historical Provider, MD  ferrous sulfate 325 (65 FE) MG tablet Take 325 mg by mouth daily with breakfast.    Historical Provider, MD  metoprolol succinate (TOPROL-XL) 50 MG 24 hr tablet Take 50 mg by mouth daily. Take with or immediately following a meal.    Historical Provider, MD  pioglitazone (ACTOS) 30 MG tablet Take 15 mg by mouth daily.     Historical Provider, MD   Allergies  Allergen Reactions  . Codeine Nausea Only    Review of Systems  Unable to perform ROS: Acuity of condition    Physical Exam  Nursing note and vitals reviewed. Constitutional: She appears well-nourished.  HENT:  Head: Normocephalic and atraumatic.  Cardiovascular: Normal rate and regular rhythm.   Respiratory:  Frequent apnea  Neurological:  Opens her eyes to voice. Speech is clear. Can repeat  some phrases, such as "I love you" back to family who told her they loved her too  Skin: Skin is warm and dry.    Vital Signs: BP 112/70 mmHg  Pulse 84  Temp(Src) 97.2 F (36.2 C) (Axillary)  Resp 26  Ht 5\' 2"  (1.575 m)  Wt 65.1 kg (143 lb 8.3 oz)  BMI 26.24 kg/m2  SpO2 93%  SpO2: SpO2: 93 % O2 Device:SpO2: 93 % O2 Flow Rate: .O2 Flow Rate (L/min): 2 L/min  IO: Intake/output summary:  Intake/Output Summary (Last 24 hours) at 12/28/15 1513 Last data filed at 12/28/15 [id number]  Gross per 24 hour  Intake      0 ml  Output    560 ml  Net   -560 ml    LBM: Last BM Date: 12/27/15 Baseline Weight: Weight: 58.968 kg (130 lb) Most recent weight: Weight: 65.1 kg (143 lb 8.3 oz)      Palliative Assessment/Data:  Flowsheet Rows        Most Recent Value   Intake Tab    Referral Department  Hospitalist   Unit at Time of Referral  Intermediate Care Unit   Palliative Care Primary Diagnosis  Cardiac   Date Notified  12/27/15    Palliative Care Type  New Palliative care   Reason for referral  Clarify Goals of Care   Date of Admission  12/26/15   Date first seen by Palliative Care  12/28/15   # of days Palliative referral response time  1 Day(s)   # of days IP prior to Palliative referral  1   Clinical Assessment    Palliative Performance Scale Score  20%   Pain Max last 24 hours  Not able to report   Pain Min Last 24 hours  Not able to report   Dyspnea Max Last 24 Hours  Not able to report   Dyspnea Min Last 24 hours  Not able to report   Nausea Max Last 24 Hours  Not able to report   Nausea Min Last 24 Hours  Not able to report   Anxiety Max Last 24 Hours  Not able to report   Anxiety Min Last 24 Hours  Not able to report   Other Max Last 24 Hours  Not able to report   Psychosocial & Spiritual Assessment    Palliative Care Outcomes       Additional Data Reviewed:  CBC:    Component Value Date/Time   WBC 13.4* 12/27/2015 2131   HGB 10.7* 12/27/2015 2131   HCT 33.0* 12/27/2015 2131   PLT 137* 12/27/2015 2131   MCV 85.9 12/27/2015 2131   NEUTROABS 10.8* 12/27/2015 2131   LYMPHSABS 1.5 12/27/2015 2131   MONOABS 1.1* 12/27/2015 2131   EOSABS 0.0 12/27/2015 2131   BASOSABS 0.0 12/27/2015 2131   Comprehensive Metabolic Panel:    Component Value Date/Time   NA 135 12/27/2015 2131   K 5.1 12/27/2015 2131   CL 108 12/27/2015 2131   CO2 16* 12/27/2015 2131   BUN 42* 12/27/2015 2131   CREATININE 2.67* 12/27/2015 2131   GLUCOSE 226* 12/27/2015 2131   CALCIUM 8.4* 12/27/2015 2131   AST 98* 12/27/2015 2131   ALT 35 12/27/2015 2131   ALKPHOS 63 12/27/2015 2131   BILITOT 0.8 12/27/2015 2131   PROT 6.1* 12/27/2015 2131   ALBUMIN 3.4* 12/27/2015 2131     Time In: 1400 Time Out: 1520 Time Total: 80 min Greater than 50%  of this time was spent  counseling and coordinating care related to the above assessment and plan.  Signed by: Dory Horn, NP  Dory Horn, NP  12/28/2015, 3:13  PM  Please contact Palliative Medicine Team phone at [phone] for questions and concerns.

## 2015-12-28 NOTE — Progress Notes (Signed)
12/28/2015 Pt had not voided all night.  A bladder scan showed 389 cc in the bladder.  [facility] Hospitalists were asked for an In & out cath which they ordered.  The cath showed about 550 cc output.  Will continue to monitor the patient. Lupita Dawn

## 2015-12-28 NOTE — Progress Notes (Signed)
Consult received for goals of care. Went by to see patient, but she is nonverbal, minimally responsive. Have called her daughter, Jhonnie Garner at [phone], to arrange goals of care meeting. Left message. We will continue to try to follow up with patient and family over the weekend to arrange goals of care meeting. Thank you for the consult,  Romona Curls, NP

## 2015-12-28 NOTE — Progress Notes (Signed)
ANTICOAGULATION CONSULT NOTE - Follow Up Consult  Pharmacy Consult for Heparin Indication: chest pain/ACS vs afib  Allergies  Allergen Reactions  . Codeine Nausea Only    Patient Measurements: Height: 5\' 2"  (157.5 cm) Weight: 143 lb 8.3 oz (65.1 kg) IBW/kg (Calculated) : 50.1  HDW: 63 kg  Vital Signs: Temp: 97.2 F (36.2 C) (04/14 0800) Temp Source: Axillary (04/14 0800) BP: 112/70 mmHg (04/14 0800) Pulse Rate: 84 (04/14 0800)  Labs:  Recent Labs  12/26/15 2137 12/26/15 2141 12/26/15 2248  12/27/15 1532 12/27/15 2131 12/28/15 0213 12/28/15 0908 12/28/15 1125  HGB 12.9 11.8*  --   --   --  10.7*  --   --   --   HCT 38.0 35.5*  --   --   --  33.0*  --   --   --   PLT  --  147*  --   --   --  137*  --   --   --   APTT  --  21*  --   --   --   --   --   --   --   LABPROT  --  12.7  --   --   --   --   --   --   --   INR  --  0.94  --   --   --   --   --   --   --   HEPARINUNFRC  --   --   --   --  0.27*  --  0.57  --  0.56  CREATININE 1.20* 1.37* 1.39*  --   --  2.67*  --   --   --   TROPONINI  --   --   --   < > 32.73* 35.31* 38.35* 35.92*  --   < > = values in this interval not displayed.  Estimated Creatinine Clearance: 14.1 mL/min (by C-G formula based on Cr of 2.67).   Medications:  Infusions:  . sodium chloride 50 mL/hr at 12/28/15 0700  . heparin 800 Units/hr (12/28/15 0700)    Assessment: 82 year old female on IV heparin for ACS (Troponin 35).  R/o CVA on admit - possibly cardiogenic emboli versus a CVA. Discussed case with Burnetta Sabin (Neurology NP) - confirmed stroke, keep goal at 0.3 to 0.5 to reduce risk of hemorrhagic transformation  Current heparin level is high for goal at 0.56 after reduction in heparin from 900 units/hr to 800 units/hr.   Goal of Therapy:  Heparin level 0.3 to 0.5 units/ml Monitor platelets by anticoagulation protocol: Yes   Plan:  Reduce heparin further to 700 units/hr.  Recheck heparin level in 8 hours.  Daily heparin  level and CBC.   Sloan Leiter, PharmD, BCPS Clinical Pharmacist [phone] 12/28/2015,1:34 PM

## 2015-12-28 NOTE — Progress Notes (Signed)
ANTICOAGULATION CONSULT NOTE - Initial Consult  Pharmacy Consult for Heparin  Indication: chest pain/ACS, elevated troponin  Allergies  Allergen Reactions  . Codeine Nausea Only   Patient Measurements: Height: 5\' 2"  (157.5 cm) Weight: 143 lb 8.3 oz (65.1 kg) IBW/kg (Calculated) : 50.1  Vital Signs: Temp: 99.1 F (37.3 C) (04/13 2335) Temp Source: Axillary (04/13 2335) BP: 122/69 mmHg (04/13 2335) Pulse Rate: 81 (04/13 2335)  Labs:  Recent Labs  12/26/15 2137 12/26/15 2141 12/26/15 2248  12/27/15 1134 12/27/15 1532 12/27/15 2131 12/28/15 0213  HGB 12.9 11.8*  --   --   --   --  10.7*  --   HCT 38.0 35.5*  --   --   --   --  33.0*  --   PLT  --  147*  --   --   --   --  137*  --   APTT  --  21*  --   --   --   --   --   --   LABPROT  --  12.7  --   --   --   --   --   --   INR  --  0.94  --   --   --   --   --   --   HEPARINUNFRC  --   --   --   --   --  0.27*  --  0.57  CREATININE 1.20* 1.37* 1.39*  --   --   --  2.67*  --   TROPONINI  --   --   --   < > 19.80* 32.73* 35.31*  --   < > = values in this interval not displayed.  Medical History: Past Medical History  Diagnosis Date  . Type 2 diabetes mellitus ([place])   . Colon cancer ([place])   . Anemia   . Essential hypertension, benign   . Mixed hyperlipidemia   . Macular degeneration   . History of pneumonia   . Iron deficiency anemia     Erosive antral gastritis  . Cognitive impairment     Assessment: 82 y/o F with elevated troponin, on heparin, troponin rising, HL slightly above goal this AM  Goal of Therapy:  Heparin level 0.3-0.5 units/ml Monitor platelets by anticoagulation protocol: Yes   Plan:  -Reduce heparin drip to 800 units/hr -1100 HL  Narda Bonds 12/28/2015,3:04 AM

## 2015-12-28 NOTE — Progress Notes (Addendum)
STROKE TEAM PROGRESS NOTE   SUBJECTIVE (INTERVAL HISTORY) No family is at the bedside. She wakens to voice and states she is "alright". Knows her name, can hold both arms up in the air, follows simple command - closing eyes, sticking out tongue, but does perseverate with answers. R HP much improved. Mental status much improved. On heparin drip and troponin level significantly elevated consistent with MI. Pt denies chest pain.   OBJECTIVE Temp:  [97.2 F (36.2 C)-99.1 F (37.3 C)] 97.2 F (36.2 C) (04/14 0800) Pulse Rate:  [81-84] 84 (04/14 0800) Cardiac Rhythm:  [-] Normal sinus rhythm (04/14 0700) Resp:  [20-28] 26 (04/14 0800) BP: (107-133)/(57-79) 112/70 mmHg (04/14 0800) SpO2:  [93 %-100 %] 93 % (04/14 0800) Weight:  [65.1 kg (143 lb 8.3 oz)] 65.1 kg (143 lb 8.3 oz) (04/13 2035)  CBC:   Recent Labs Lab 12/26/15 2141 12/27/15 2131  WBC 9.9 13.4*  NEUTROABS 4.8 10.8*  HGB 11.8* 10.7*  HCT 35.5* 33.0*  MCV 86.4 85.9  PLT 147* 137*    Basic Metabolic Panel:   Recent Labs Lab 12/26/15 2248 12/27/15 2131  NA 140 135  K 5.0 5.1  CL 107 108  CO2 20* 16*  GLUCOSE 231* 226*  BUN 23* 42*  CREATININE 1.39* 2.67*  CALCIUM 8.9 8.4*    Lipid Panel:     Component Value Date/Time   CHOL 180 12/27/2015 0517   TRIG 68 12/27/2015 0517   HDL 82 12/27/2015 0517   CHOLHDL 2.2 12/27/2015 0517   VLDL 14 12/27/2015 0517   LDLCALC 84 12/27/2015 0517   HgbA1c:  Lab Results  Component Value Date   HGBA1C 6.5* 12/27/2015   Urine Drug Screen: No results found for: LABOPIA, COCAINSCRNUR, LABBENZ, AMPHETMU, THCU, LABBARB    IMAGING I have personally reviewed the radiological images below and agree with the radiology interpretations.  Ct Head Wo Contrast 12/26/2015  Suboptimal study due to motion artifact. Atrophy with patchy periventricular small vessel disease. No acute infarct is evident. No hemorrhage or mass effect.   Mri & Mra Brain Wo Contrast 12/27/2015   Multifocal  areas of acute nonhemorrhagic infarction, subcentimeter in size of a symmetric nature affecting the cortex, subcortical white matter, basal ganglia, and cerebellum. These are most consistent with watershed infarcts, commonly associated with cardiac arrhythmia or hypotensive event. Shower of emboli not excluded but less favored. Atrophy and small vessel disease similar to priors. No large vessel occlusion on MRA. Potentially flow reducing stenosis of the cavernous ICA on the RIGHT, 50-75%.   Dg Chest Port 1 View 12/26/2015  Findings consistent with congestive heart failure. No airspace consolidation.  CUS - Bilateral: No significant (1-39%) ICA stenosis. Antegrade vertebral flow.   TTE - pending   LE venous doppler - No evidence of DVT or baker's cyst.   PHYSICAL EXAM  Temp:  [97.2 F (36.2 C)-99.1 F (37.3 C)] 97.2 F (36.2 C) (04/14 0800) Pulse Rate:  [81-84] 84 (04/14 0800) Resp:  [20-28] 26 (04/14 0800) BP: (107-133)/(57-79) 112/70 mmHg (04/14 0800) SpO2:  [93 %-100 %] 93 % (04/14 0800) Weight:  [65.1 kg (143 lb 8.3 oz)] 65.1 kg (143 lb 8.3 oz) (04/13 2035)  General - Well nourished, well developed, sleepy but arousable.  Ophthalmologic - Fundi not visualized due to noncooperation.  Cardiovascular - Regular rate and rhythm, not in irregular rhythm.  Mental Status -  Sleepy but arousable to voice and following commands, know her name and age but not orientated to time,  place. Paucity of speech, but able to answer questions with short ansers and following simple commands.  Cranial Nerves II - XII - II - not blink to visual threat bilaterally (pt legally blind). III, IV, VI - Extraocular movements intact. V - Facial sensation intact bilaterally. VII - Facial movement intact bilaterally. VIII - Hearing & vestibular intact bilaterally. X - Palate elevates symmetrically. XI - Chin turning & shoulder shrug intact bilaterally. XII - Tongue protrusion intact.  Motor Strength -  The patient's strength was LUE 4/5 and LLE 4+/5, RUE and RLE 5/5. Bulk was normal and fasciculations were absent.   Motor Tone - Muscle tone was assessed at the neck and appendages and was normal.  Reflexes - The patient's reflexes were 1+ in all extremities and she had no pathological reflexes.  Sensory - Light touch, temperature/pinprick not cooperative on exam.    Coordination - not cooperative on exam.  Tremor was absent.  Gait and Station - not tested due to safety concerns.   ASSESSMENT/PLAN [name] is a 82 y.o. female with history of type 2 diabetes mellitus, colon cancer, hypertension, hyperlipidemia, macular degeneration with blindness and an equivocal history of cognitive impairment  presenting with altered mental status. She did not receive IV t-PA due to non-focal neurologic exam.   Stroke:  Bilateral, infra and supra-tentorial infarcts, consistent with cardioembolic infarcts secondary to either atrial fibrillation vs acute MI  Resultant  L facial, L arm and leg weakness much improved, awakens to voice   MRI  Acute bilateral infra- and supratentorial small infarcts  MRA  No large vessel occlusion  Carotid Doppler  No significant stenosis    2D Echo  pending   LE venous doppler no DVT  LDL 84  HgbA1c 6.5  Heparin 5000 units sq tid for VTE prophylaxis Diet NPO time specified. Asked ST to reassess again today.   No antithrombotic prior to admission, now on heparin IV and ASA. Ok for anticoagulation with IV heparin from stroke standpoint given small sizes of infarcts.  Ongoing aggressive stroke risk factor management  Therapy recommendations:  pending   Disposition:  pending   Elevated tropoinin likely due to MI Acute coronary syndrome vs atrial fibrillation   Troponin 0.05 on arrival, up to 7.77 next am with EKG concerning for afib, not confirmed yet. troponins up to 35.31 last night and 38.35 this am.  Cardiology on board  Per cardiology - No  proof that elevated troponin is cardiac related  No acute intervention planned. Denies CP this am.  On ASA and heparin drip  Hypertension  Normal in the 100-120s since arrival  Permissive hypertension (OK if <180/105) for 24-48 hours post stroke and then gradually normalized within 5-7 days.  Hyperlipidemia  Home meds:  lipitor 10, resumed in hospital (though NPO)  LDL 84, goal < 70  Continue statin at discharge  Diabetes  HgbA1c 6.5, goal < 7.0  CBG elevated  On SSI - DB RN recs increasing SSI to mod correction  Other Stroke Risk Factors  Advanced age  No chest pain or abnormal findings on CXR to support aortic dissection  Other Active Problems  Baseline cognitive impairment - followed by Dr. Jaynee Eagles  B12 deficiency - follow up with Dr. Jaynee Eagles  Hyperkalemia  AKI  Possible CHF on CXR  Legally blind  Colon cancer  Urinary retention w/ i&o cath  Hospital day # 1  Rosalin Hawking, MD PhD Stroke Neurology 12/28/2015 3:24 PM    To contact  Stroke Continuity provider, please refer to [email or web address] After hours, contact General Neurology

## 2015-12-28 NOTE — Progress Notes (Signed)
PT Cancellation Note  Patient Details Name: Laura Parks MRN: [id number] DOB: 10-05-1929   Cancelled Treatment:    Reason Eval/Treat Not Completed: Medical issues which prohibited therapy (Per nurse, pt has nonreactive pupils and troponins 38.  )Will check back tomorrow and proceed as able.  Thanks.    Irwin Brakeman F 12/28/2015, 8:30 AM [facility] [phone] (pager)

## 2015-12-28 NOTE — Progress Notes (Signed)
INITIAL NUTRITION ASSESSMENT   INTERVENTION:  Diet advancement per MD/SLP Add Glucerna Shake po TID when diet advanced, each supplement provides 220 kcal and 10 grams of protein  Monitor diet advancement and PO adequacy; consider Cortrak NGT for nutrition pending goals or care  NUTRITION DIAGNOSIS:   Inadequate oral intake related to lethargy/confusion as evidenced by NPO status.   GOAL:   Patient will meet greater than or equal to 90% of their needs   MONITOR:   Diet advancement, PO intake, Supplement acceptance, Labs, Weight trends, Skin, I & O's  REASON FOR ASSESSMENT:   Low Braden    ASSESSMENT:   82 y.o. female with a history of type 2 diabetes mellitus, colon cancer, hypertension, hyperlipidemia, macular degeneration with blindness and an equivocal history of cognitive impairment, brought to the emergency room and code stroke status with acute confusion and agitation.  SLP working with patient at time of visit; pt able to answer questions, but having difficulty staying awake. Per SLP, doubt pt would stay awake enough to eat 3 meals daily. Limited physical exam due; pt has some moderate muscle wasting in legs. Per weight history, pt lost down to 1256 lbs back in December, but has since regained. Note palliative consult in place.   Labs: low GFR, elevated BUN/Creatinine, glucose ranging 140 to 257 mg/dL, high troponin  Diet Order:  Diet NPO time specified  Skin:  Reviewed, no issues  Last BM:  unknown  Height:   Ht Readings from Last 1 Encounters:  12/27/15 5\' 2"  (1.575 m)    Weight:   Wt Readings from Last 1 Encounters:  12/27/15 143 lb 8.3 oz (65.1 kg)    Ideal Body Weight:  50 kg  BMI:  Body mass index is 26.24 kg/(m^2).  Estimated Nutritional Needs:   Kcal:  1450-1650  Protein:  75-85 grams  Fluid:  1.6 L/day  EDUCATION NEEDS:   No education needs identified at this time  [place], LDN Inpatient Clinical Dietitian Pager:  [phone] After Hours Pager: [phone]

## 2015-12-28 NOTE — Progress Notes (Signed)
Foley catheter inserted per verbal MD order with Jerold Coombe due to acute urinary retention. No evidence of learning due to patient cognitive state.  Drops of urine was released during insertion. Urine was thick and cloudy. Pt tolerated well.

## 2015-12-28 NOTE — Progress Notes (Signed)
Patient has not voided. Bladder scan completed. 161 ml volume noted. Dr. Doyle Askew notified. Family at bedside. Bed is low and brakes are locked. Call bell within reach. Will continue to monitor.

## 2015-12-29 DIAGNOSIS — G9340 Encephalopathy, unspecified: Secondary | ICD-10-CM

## 2015-12-29 DIAGNOSIS — I480 Paroxysmal atrial fibrillation: Secondary | ICD-10-CM

## 2015-12-29 DIAGNOSIS — N179 Acute kidney failure, unspecified: Secondary | ICD-10-CM

## 2015-12-29 LAB — URINALYSIS, ROUTINE W REFLEX MICROSCOPIC
Glucose, UA: NEGATIVE mg/dL
Hgb urine dipstick: NEGATIVE
KETONES UR: 15 mg/dL — AB
LEUKOCYTES UA: NEGATIVE
Nitrite: NEGATIVE
PH: 5.5 (ref 5.0–8.0)
Protein, ur: 30 mg/dL — AB
SPECIFIC GRAVITY, URINE: 1.034 — AB (ref 1.005–1.030)

## 2015-12-29 LAB — TROPONIN I
TROPONIN I: 23.77 ng/mL — AB (ref <0.031)
TROPONIN I: 19.7 ng/mL — AB (ref <0.031)
Troponin I: 17.6 ng/mL (ref <0.031)

## 2015-12-29 LAB — GLUCOSE, CAPILLARY
GLUCOSE-CAPILLARY: 98 mg/dL (ref 65–99)
GLUCOSE-CAPILLARY: 102 mg/dL — AB (ref 65–99)
Glucose-Capillary: 112 mg/dL — ABNORMAL HIGH (ref 65–99)
Glucose-Capillary: 123 mg/dL — ABNORMAL HIGH (ref 65–99)
Glucose-Capillary: 116 mg/dL — ABNORMAL HIGH (ref 65–99)
Glucose-Capillary: 124 mg/dL — ABNORMAL HIGH (ref 65–99)

## 2015-12-29 LAB — CBC
HCT: 33.7 % — ABNORMAL LOW (ref 36.0–46.0)
Hemoglobin: 10.6 g/dL — ABNORMAL LOW (ref 12.0–15.0)
MCH: 27.2 pg (ref 26.0–34.0)
MCHC: 31.5 g/dL (ref 30.0–36.0)
MCV: 86.6 fL (ref 78.0–100.0)
PLATELETS: 136 10*3/uL — AB (ref 150–400)
RBC: 3.89 MIL/uL (ref 3.87–5.11)
RDW: 15.1 % (ref 11.5–15.5)
WBC: 13.8 10*3/uL — ABNORMAL HIGH (ref 4.0–10.5)

## 2015-12-29 LAB — URINE MICROSCOPIC-ADD ON: RBC / HPF: NONE SEEN RBC/hpf (ref 0–5)

## 2015-12-29 LAB — BASIC METABOLIC PANEL
Anion gap: 10 (ref 5–15)
BUN: 51 mg/dL — AB (ref 6–20)
CO2: 20 mmol/L — ABNORMAL LOW (ref 22–32)
CREATININE: 2 mg/dL — AB (ref 0.44–1.00)
Calcium: 8.6 mg/dL — ABNORMAL LOW (ref 8.9–10.3)
Chloride: 115 mmol/L — ABNORMAL HIGH (ref 101–111)
GFR calc Af Amer: 25 mL/min — ABNORMAL LOW (ref >60)
GFR, EST NON AFRICAN AMERICAN: 22 mL/min — AB (ref >60)
Glucose, Bld: 109 mg/dL — ABNORMAL HIGH (ref 65–99)
POTASSIUM: 5 mmol/L (ref 3.5–5.1)
SODIUM: 145 mmol/L (ref 135–145)

## 2015-12-29 LAB — HEPARIN LEVEL (UNFRACTIONATED)
HEPARIN UNFRACTIONATED: 0.23 [IU]/mL — AB (ref 0.30–0.70)
Heparin Unfractionated: 0.21 IU/mL — ABNORMAL LOW (ref 0.30–0.70)

## 2015-12-29 LAB — CREATININE, URINE, RANDOM: CREATININE, URINE: 176.23 mg/dL

## 2015-12-29 LAB — SODIUM, URINE, RANDOM: Sodium, Ur: 47 mmol/L

## 2015-12-29 MED ORDER — ATORVASTATIN CALCIUM 10 MG PO TABS
10.0000 mg | ORAL_TABLET | Freq: Every day | ORAL | Status: DC
  Administered 2015-12-29 – 2016-01-06 (×8): 10 mg via ORAL
  Filled 2015-12-29 (×8): qty 1

## 2015-12-29 MED ORDER — DEXTROSE 5 % IV SOLN
1.0000 g | INTRAVENOUS | Status: DC
  Administered 2015-12-29 – 2015-12-30 (×2): 1 g via INTRAVENOUS
  Filled 2015-12-29 (×3): qty 10

## 2015-12-29 MED ORDER — CITALOPRAM HYDROBROMIDE 20 MG PO TABS
20.0000 mg | ORAL_TABLET | Freq: Every day | ORAL | Status: DC
  Administered 2015-12-29 – 2016-01-06 (×9): 20 mg via ORAL
  Filled 2015-12-29 (×9): qty 1

## 2015-12-29 MED ORDER — MIRTAZAPINE 7.5 MG PO TABS
15.0000 mg | ORAL_TABLET | Freq: Every day | ORAL | Status: DC
  Administered 2015-12-29 – 2016-01-05 (×7): 15 mg via ORAL
  Filled 2015-12-29 (×5): qty 1
  Filled 2015-12-29 (×2): qty 2
  Filled 2015-12-29: qty 1

## 2015-12-29 NOTE — Progress Notes (Signed)
Patient ID: Laura Parks, female   DOB: [birth year], 82 y.o.   MRN: [id number]    PROGRESS NOTE    Laura Parks  [id number] DOB: 07/08/1930 DOA: 12/26/2015  PCP: Glenda Chroman., MD   Outpatient Specialists:   Brief Narrative:   82 y.o. female with a history of type 2 diabetes mellitus, colon cancer, hypertension, hyperlipidemia, macular degeneration with blindness and an equivocal history of cognitive impairment, brought to the emergency room for evaluation of confusion and slurred speech that was noted several hours prior to this admission. She was unable to provide any history at the time of the admission and while in ED she has vomited multiple times. CT scan of her head showed no acute intracranial abnormality. She has remained lethargic throughout the ED evaluation. Code stroke was subsequently canceled, as acute aphasia was not clear and patient had no clear focal motor deficits.  Assessment & Plan:   Bilateral, infra and supra-tentorial infarcts, c/w cardioembolic infarcts secondary to either atrial fibrillation vs acute MI - Resultant L facial, L arm and leg weakness, still unresponsive this AM - MRI Acute bilateral infra- and supratentorial small infarcts - Carotid Doppler and ECHO with normal EF [id number], normal systolic function, grade I diastolic CHF - currently on IV Heparin and will continue per cardiology team recommendations  - will need SLP/PT/OT, attempt this AM - appreciate neurology team following   Elevated tropoinin Acute coronary syndrome vs atrial fibrillation, ? NSTEMI, demand ischemia  - Troponin 0.05 on arrival, up to 7.77 --> 19 --> 32 --> 35 --> 39 --> 27 --> 23  - cardiology following, appreciate assistance  - continue with Heparin drip  - may be suitable cardiac cath next week provided renal function stabilizes   Leukocytosis - unclear etiology - no clear infectious source - CXR unremarkable - this AM, urine cloudy and malodorous - requested repeat  UA and urine culture - place on empiric Rocephin for now 4/15   Hypertension, essential  - reasonably stable this AM  Diabetes, type II, on oral antihyperglycemics - on Actos at home - here on SSI while NPO - once SLP done, change diet appropriately and readjust the insulin regimen   Acute kidney injury with metabolic acidosis  - from acute illness, pre renal etiology, demand ischemia - renal US with no acute abnormalities noted  - Cr trending down  - BMP in AM  Hyperkalemia - resolved   Thrombocytopenia - reactive, no signs of bleeding but since pt on Heparin drip, need to monitor closely  - CBC in AM  DVT prophylaxis: On Heparin drip  Code Status: Full  Family Communication: Multiple family members over the phone with nurse present  Disposition Plan: Unable to determine at this time, depends on progress, PT and OT evaluation, ? Need for cardiac cath next week  Consultants:   Cardiology  Neurology   [name]  Procedures:   None   Antimicrobials:   Rocephin 4/15 -->  Subjective: More alert this AM.   Objective: Filed Vitals:   12/29/15 0000 12/29/15 0438 12/29/15 0800 12/29/15 1243  BP: 131/84  121/80 134/76  Pulse: 83  84 80  Temp: 97.5 F (36.4 C) 97.3 F (36.3 C) 97.3 F (36.3 C) 98.4 F (36.9 C)  TempSrc: Oral  Oral Oral  Resp: 19  12 22   Height:      Weight:      SpO2: 97%  97% 99%    Intake/Output Summary (Last 24 hours) at 12/29/15 1249  Last data filed at 12/29/15 0300  Gross per 24 hour  Intake 2707.78 ml  Output      0 ml  Net 2707.78 ml   Filed Weights   12/26/15 2206 12/27/15 0317 12/27/15 2035  Weight: 58.968 kg (130 lb) 67.4 kg (148 lb 9.4 oz) 65.1 kg (143 lb 8.3 oz)    Examination:  General exam: More alert this AM Respiratory system: Poor inspiratory effort, diminished breath sounds at bases Cardiovascular system: S1 & S2 heard, RRR. No JVD, murmurs Gastrointestinal system: Abdomen is nondistended, soft and nontender.    Central nervous system: Alert, follows commands   Data Reviewed: I have personally reviewed following labs and imaging studies  CBC:  Recent Labs Lab 12/26/15 2137 12/26/15 2141 12/27/15 2131 12/28/15 1500 12/29/15 0400  WBC  --  9.9 13.4* 16.6* 13.8*  NEUTROABS  --  4.8 10.8*  --   --   HGB 12.9 11.8* 10.7* 10.4* 10.6*  HCT 38.0 35.5* 33.0* 32.8* 33.7*  MCV  --  86.4 85.9 86.1 86.6  PLT  --  147* 137* 134* [id number]*   Basic Metabolic Panel:  Recent Labs Lab 12/26/15 2141 12/26/15 2248 12/27/15 2131 12/28/15 1500 12/29/15 0400  NA 140 140 135 143 145  K 5.3* 5.0 5.1 4.9 5.0  CL 106 107 108 116* 115*  CO2 21* 20* 16* 12* 20*  GLUCOSE 184* 231* 226* 123* 109*  BUN 21* 23* 42* 49* 51*  CREATININE 1.37* 1.39* 2.67* 2.18* 2.00*  CALCIUM 9.3 8.9 8.4* 8.5* 8.6*   Liver Function Tests:  Recent Labs Lab 12/26/15 2141 12/26/15 2248 12/27/15 2131  AST 27 23 98*  ALT 16 14 35  ALKPHOS 84 80 63  BILITOT 0.9 0.8 0.8  PROT 6.9 6.5 6.1*  ALBUMIN 4.0 3.6 3.4*   Coagulation Profile:  Recent Labs Lab 12/26/15 2141  INR 0.94   Cardiac Enzymes:  Recent Labs Lab 12/28/15 0213 12/28/15 0908 12/28/15 1500 12/28/15 2154 12/29/15 0400  TROPONINI 38.35* 35.92* 26.53* 27.25* 23.77*   CBG:  Recent Labs Lab 12/28/15 1236 12/28/15 1621 12/28/15 2040 12/29/15 0103 12/29/15 0439  GLUCAP 129* 107* 134* 112* 98   Lipid Profile:  Recent Labs  12/27/15 0517  CHOL 180  HDL 82  LDLCALC 84  TRIG 68  CHOLHDL 2.2   Urine analysis:    Component Value Date/Time   COLORURINE YELLOW 12/26/2015 [place] 12/26/2015 2225   LABSPEC 1.019 12/26/2015 2225   PHURINE 5.5 12/26/2015 2225   GLUCOSEU NEGATIVE 12/26/2015 2225   HGBUR TRACE* 12/26/2015 [place] NEGATIVE 12/26/2015 [place] 12/26/2015 2225   PROTEINUR >300* 12/26/2015 2225   NITRITE NEGATIVE 12/26/2015 2225   LEUKOCYTESUR NEGATIVE 12/26/2015 2225   Recent Results  (from the past 240 hour(s))  Urine culture     Status: None   Collection Time: 12/26/15 10:25 PM  Result Value Ref Range Status   Specimen Description URINE, RANDOM  Final   Special Requests Normal  Final   Culture NO GROWTH 1 DAY  Final   Report Status 12/28/2015 FINAL  Final  MRSA PCR Screening     Status: None   Collection Time: 12/27/15  9:07 PM  Result Value Ref Range Status   MRSA by PCR NEGATIVE NEGATIVE Final    Comment:        The GeneXpert MRSA Assay (FDA approved for NASAL specimens only), is one component of a comprehensive MRSA colonization surveillance program. It  is not intended to diagnose MRSA infection nor to guide or monitor treatment for MRSA infections.       Radiology Studies: Ct Abdomen Pelvis W Contrast 12/27/2015   Small bilateral effusions Sigmoid diverticulosis. Negative for diverticulitis.   US Renal 12/28/2015  No hydronephrosis. Mild renal atrophy.   Scheduled Meds: . antiseptic oral rinse  7 mL Mouth Rinse BID  . aspirin  300 mg Rectal Daily   Or  . aspirin  325 mg Oral Daily  . atorvastatin  10 mg Oral Daily  . cefTRIAXone (ROCEPHIN)  IV  1 g Intravenous Q24H  . cholestyramine light  4 g Oral BID  . insulin aspart  0-9 Units Subcutaneous 6 times per day   Continuous Infusions: . sodium chloride 50 mL/hr at 12/28/15 0700  . heparin 750 Units/hr (12/29/15 0915)     LOS: 2 days   Time spent: 20 minutes    Faye Ramsay, MD [facility] Pager [phone]  If 7PM-7AM, please contact night-coverage [email or web address] Password [facility] 12/29/2015, 12:49 PM

## 2015-12-29 NOTE — Progress Notes (Signed)
STROKE TEAM PROGRESS NOTE   SUBJECTIVE (INTERVAL HISTORY) No family is at the bedside. She wakens to voice and states she is "alright". Knows her name, can hold both arms up in the air, follows simple command - closing eyes, sticking out tongue, but does perseverate with answers. R HP much improved. Mental status much improved. On heparin drip and troponin level significantly elevated consistent with MI. Pt denies chest pain.  She clearly knows her name, but believes she is at [place]:  [97.3 F (36.3 C)-98.9 F (37.2 C)] 97.3 F (36.3 C) (04/15 0800) Pulse Rate:  [83-84] 84 (04/15 0800) Cardiac Rhythm:  [-] Normal sinus rhythm (04/15 0700) Resp:  [12-20] 12 (04/15 0800) BP: (113-131)/(56-84) 121/80 mmHg (04/15 0800) SpO2:  [95 %-97 %] 97 % (04/15 0800)  CBC:   Recent Labs Lab 12/26/15 2141 12/27/15 2131 12/28/15 1500 12/29/15 0400  WBC 9.9 13.4* 16.6* 13.8*  NEUTROABS 4.8 10.8*  --   --   HGB 11.8* 10.7* 10.4* 10.6*  HCT 35.5* 33.0* 32.8* 33.7*  MCV 86.4 85.9 86.1 86.6  PLT 147* 137* 134* 136*    Basic Metabolic Panel:   Recent Labs Lab 12/28/15 1500 12/29/15 0400  NA 143 145  K 4.9 5.0  CL 116* 115*  CO2 12* 20*  GLUCOSE 123* 109*  BUN 49* 51*  CREATININE 2.18* 2.00*  CALCIUM 8.5* 8.6*    Lipid Panel:     Component Value Date/Time   CHOL 180 12/27/2015 0517   TRIG 68 12/27/2015 0517   HDL 82 12/27/2015 0517   CHOLHDL 2.2 12/27/2015 0517   VLDL 14 12/27/2015 0517   LDLCALC 84 12/27/2015 0517   HgbA1c:  Lab Results  Component Value Date   HGBA1C 6.6* 12/27/2015   Urine Drug Screen: No results found for: LABOPIA, COCAINSCRNUR, LABBENZ, AMPHETMU, THCU, LABBARB    IMAGING I have personally reviewed the radiological images below and agree with the radiology interpretations.   Ct Head Wo Contrast 12/26/2015  Suboptimal study due to motion artifact. Atrophy with patchy periventricular small vessel disease. No acute infarct  is evident. No hemorrhage or mass effect.     Mri & Mra Brain Wo Contrast 12/27/2015    Multifocal areas of acute nonhemorrhagic infarction, subcentimeter in size of a symmetric nature affecting the cortex, subcortical white matter, basal ganglia, and cerebellum. These are most consistent with watershed infarcts, commonly associated with cardiac arrhythmia or hypotensive event. Shower of emboli not excluded but less favored. Atrophy and small vessel disease similar to priors. No large vessel occlusion on MRA.  Potentially flow reducing stenosis of the cavernous ICA on the RIGHT, 50-75%.    Dg Chest Port 1 View 12/26/2015   Findings consistent with congestive heart failure. No airspace consolidation.   CUS - Bilateral: No significant (1-39%) ICA stenosis. Antegrade vertebral flow.    TTE 12/28/2015 Study Conclusions - Left ventricle: The cavity size was normal. Wall thickness was  increased in a pattern of mild LVH. Systolic function was normal.  The estimated ejection fraction was in the range of 60% to 65%.  Wall motion was normal; there were no regional wall motion  abnormalities. Doppler parameters are consistent with abnormal  left ventricular relaxation (grade 1 diastolic dysfunction). - Aortic valve: Severely calcified annulus. Moderately thickened,  moderately calcified leaflets. There was mild stenosis. Valve  area (VTI): 1.86 cm^2. Valve area (Vmax): 1.75 cm^2. Valve area  (Vmean): 1.56 cm^2. - Mitral valve: Moderately calcified annulus. Moderately  thickened,  moderately calcified leaflets . The findings are consistent with  mild stenosis. There was mild regurgitation. Valve area by  continuity equation (using LVOT flow): 1.37 cm^2. - Left atrium: The atrium was mildly dilated. - Right atrium: The atrium was moderately dilated.  LE venous doppler - No evidence of DVT or baker's cyst.  PHYSICAL EXAM  Temp:  [97.3 F (36.3 C)-98.9 F (37.2 C)] 97.3 F (36.3  C) (04/15 0800) Pulse Rate:  [83-84] 84 (04/15 0800) Resp:  [12-20] 12 (04/15 0800) BP: (113-131)/(56-84) 121/80 mmHg (04/15 0800) SpO2:  [95 %-97 %] 97 % (04/15 0800)  General - Well nourished, well developed, sleepy but arousable.  HEENT:  NCAT, PERRL Cardiovascular - Regular rate and rhythm Pulmonary:  CTA Abd:  NT, ND, normal bowel sounds Extremities:  No C/C/E  Mental Status -  Sleepy but arousable to voice and following commands, knows her name and age but not orientated to time, place. Paucity of speech, but able to answer questions with short ansers and following simple commands.  Cranial Nerves II - XII - II - not blink to visual threat bilaterally (pt legally blind). III, IV, VI - Extraocular movements intact. V - Facial sensation intact bilaterally. VII - Facial movement intact bilaterally. VIII - Hearing grossly decreased X - Palate elevates symmetrically. XI - Chin turning & shoulder shrug intact bilaterally. XII - Tongue protrusion intact.  Motor Strength - The patient's strength was LUE 4/5 and LLE 4+/5, RUE and RLE 5/5. Bulk was normal and fasciculations were absent.   Motor Tone - Muscle tone was assessed at the neck and appendages and was normal.  Reflexes - The patient's reflexes were 1+ in all extremities and she had no pathological reflexes.  Sensory - Light touch, temperature/pinprick not cooperative on exam.    Coordination - not cooperative on exam.  Tremor was absent.  Gait and Station - not tested due to safety concerns.   ASSESSMENT/PLAN [name] is a 82 y.o. female with history of type 2 diabetes mellitus, colon cancer, hypertension, hyperlipidemia, macular degeneration with blindness and an equivocal history of cognitive impairment  presenting with altered mental status. She did not receive IV t-PA due to non-focal neurologic exam.   Stroke:  Bilateral, infra and supra-tentorial infarcts, consistent with cardioembolic infarcts  secondary to either atrial fibrillation vs acute MI  Resultant  L facial, L arm and leg weakness much improved, awakens to voice   MRI  Acute bilateral infra- and supratentorial small infarcts  MRA  No large vessel occlusion  Carotid Doppler  No significant stenosis    2D Echo  EF 60-65%. No cardiac source of emboli identified.  LE venous doppler - no DVT  LDL 84  HgbA1c 6.5  Heparin 5000 units sq tid for VTE prophylaxis Diet NPO time specified. Asked ST to reassess again today.   No antithrombotic prior to admission, now on heparin IV and ASA. Ok for anticoagulation with IV heparin from stroke standpoint given small sizes of infarcts.  Ongoing aggressive stroke risk factor management  Therapy recommendations:  Home health physical therapy recommended with 24-hour/day supervision/assistance  Disposition:  pending    Elevated troponins likely due to MI Acute coronary syndrome vs atrial fibrillation   Troponin 0.05 on arrival, up to 7.77 next am with EKG concerning for afib, not confirmed yet. troponins up to 35.31 last night and 38.35 this am.  Cardiology on board  Per cardiology - No proof that elevated troponin is cardiac  related  No acute intervention planned. Denies CP this am.  On ASA and heparin drip  Hypertension  Normal in the 100-120s since arrival  Permissive hypertension (OK if <180/105) for 24-48 hours post stroke and then gradually normalized within 5-7 days.  Hyperlipidemia  Home meds:  lipitor 10, resumed in hospital (though NPO)  LDL 84, goal < 70  Continue statin at discharge  Diabetes  HgbA1c 6.5, goal < 7.0  CBG elevated  On SSI - DB RN recs increasing SSI to mod correction   Palliative Care Consult  Issues still being discussed. For now family would like everything done.    Other Stroke Risk Factors  Advanced age  No chest pain or abnormal findings on CXR to support aortic dissection   Other Active Problems  Baseline  cognitive impairment - followed by Dr. Jaynee Eagles  B12 deficiency - follow up with Dr. Jaynee Eagles  Hyperkalemia  AKI  Possible CHF on CXR  Legally blind  Colon cancer  Urinary retention w/ i&o cath  Hospital day # 2   The stroke team will sign off at this time. Please call if we can be of further service  Mikey Bussing PA-C [facility] Pager [phone] 12/29/2015, 2:37 PM  ATTENDING NOTE: Patient was seen and examined by me personally. Documentation reflects findings. The laboratory and radiographic studies reviewed by me. ROS pertinent positives could not be fully documented due to LOC and confusion  Assessment and plan completed by me personally and fully documented above. Plans/Recommendations include:     Stroke work completed  Palliative Care consult pending  If aggressive management selected novel anticoagulant should be used for secondary prevention after 10-14 days  SIGNED BY: Dr. Elissa Hefty    To contact Stroke Continuity provider, please refer to [email or web address] After hours, contact General Neurology

## 2015-12-29 NOTE — Evaluation (Signed)
Physical Therapy Evaluation Patient Details Name: Laura Parks MRN: [id number] DOB: 02-14-1930 Today's Date: 12/29/2015   History of Present Illness  Patient is an 82 yo female admitted 12/26/15 with Lt sided weakness, slurred speech, confusion.  Patient with bilateral CVA's, elevated troponin, dehydration, acute kidney injury.  Troponin beginning to drop today, though still elevated.  PMH:  DM, colon CA, HTN, HLD, macular degeneration, legally blind, HOH, decreased cognition  Clinical Impression  Patient presents with problems listed below.  Will benefit from acute PT to maximize functional mobility prior to discharge.  Recommend HHPT for continued therapy at discharge.    Follow Up Recommendations Home health PT;Supervision/Assistance - 24 hour    Equipment Recommendations  3in1 (PT)    Recommendations for Other Services       Precautions / Restrictions Precautions Precautions: Fall Restrictions Weight Bearing Restrictions: No      Mobility  Bed Mobility Overal bed mobility: Needs Assistance;+ 2 for safety/equipment Bed Mobility: Rolling;Sidelying to Sit;Sit to Sidelying Rolling: Mod assist Sidelying to sit: Mod assist;+2 for safety/equipment     Sit to sidelying: Mod assist;+2 for physical assistance General bed mobility comments: Verbal cues for technique.  Assist to raise trunk to sitting.  Patient with fair sitting balance.  HR in 80"s in sitting.  O2 sats at/above 92% on O2.  Transfers Overall transfer level: Needs assistance Equipment used: 2 person hand held assist Transfers: Sit to/from Stand Sit to Stand: Min assist;+2 physical assistance         General transfer comment: Assist to rise to standing and to steady during transition.  Patient required UE support for balance in standing.  Performed transfer x2.  HR max at 90.  O2 sats remained above 93%.  Ambulation/Gait Ambulation/Gait assistance: Min assist;+2 physical assistance Ambulation Distance (Feet):  2 Feet (4 steps) Assistive device: 2 person hand held assist       General Gait Details: Patient able to take 4 steps toward [facility] before sitting.  Assist required for balance.  Stairs            Wheelchair Mobility    Modified Rankin (Stroke Patients Only) Modified Rankin (Stroke Patients Only) Pre-Morbid Rankin Score: No significant disability Modified Rankin: Moderately severe disability     Balance Overall balance assessment: Needs assistance Sitting-balance support: No upper extremity supported;Feet supported Sitting balance-Leahy Scale: Fair     Standing balance support: Bilateral upper extremity supported Standing balance-Leahy Scale: Poor                               Pertinent Vitals/Pain Pain Assessment: No/denies pain    Home Living Family/patient expects to be discharged to:: Private residence Living Arrangements: Spouse/significant other;Children Available Help at Discharge: Family;Available 24 hours/day Type of Home: House Home Access: Stairs to enter Entrance Stairs-Rails: None Entrance Stairs-Number of Steps: 1 Home Layout: One level Home Equipment: Environmental consultant - 2 wheels      Prior Function Level of Independence: Independent         Comments: Was out shopping day before admit     Hand Dominance   Dominant Hand: Right    Extremity/Trunk Assessment   Upper Extremity Assessment: Generalized weakness           Lower Extremity Assessment: Generalized weakness (Strength fairly symmetrical)      Cervical / Trunk Assessment: Kyphotic  Communication   Communication: HOH  Cognition Arousal/Alertness: Awake/alert Behavior During Therapy: WFL for tasks  assessed/performed Overall Cognitive Status: History of cognitive impairments - at baseline (Oriented to person, place, situation - not time)                      General Comments      Exercises        Assessment/Plan    PT Assessment Patient needs continued  PT services  PT Diagnosis Difficulty walking;Generalized weakness   PT Problem List Decreased strength;Decreased activity tolerance;Decreased balance;Decreased mobility;Decreased knowledge of use of DME;Cardiopulmonary status limiting activity  PT Treatment Interventions DME instruction;Gait training;Functional mobility training;Therapeutic activities;Balance training;Patient/family education   PT Goals (Current goals can be found in the Care Plan section) Acute Rehab PT Goals Patient Stated Goal: To walk PT Goal Formulation: With patient/family Time For Goal Achievement: 01/05/16 Potential to Achieve Goals: Good    Frequency Min 4X/week   Barriers to discharge   Unsure of amount of assist husband can provide.    Co-evaluation               End of Session Equipment Utilized During Treatment: Gait belt;Oxygen Activity Tolerance: Patient limited by fatigue Patient left: in bed;with call bell/phone within reach;with family/visitor present Nurse Communication: Mobility status         Time: 1201-1219 PT Time Calculation (min) (ACUTE ONLY): 18 min   Charges:   PT Evaluation $PT Eval High Complexity: 1 Procedure     PT G Codes:        Despina Pole 01/16/16, 1:22 PM Carita Pian. Sanjuana Kava, [place] Pager [phone]

## 2015-12-29 NOTE — Progress Notes (Signed)
Speech Language Pathology Treatment: Dysphagia  Patient Details Name: Laura Parks MRN: [id number] DOB: 04-20-1930 Today's Date: 12/29/2015 Time: [id number] SLP Time Calculation (min) (ACUTE ONLY): 15 min  Assessment / Plan / Recommendation Clinical Impression  Alert, aware and asking "what happened to me?" Oral and pharyngeal swallow function is within normal limits. Independently checking oral cavity for residual post cracker. Delayed cough x 1, not concerning for airway penetrates/aspirates. No report of prior swallow dysfunction. Recommend regular texture and thin liquids, pills with liquid and straws allowed. No further ST needed at present.    HPI HPI: 82 y.o. female with h/o DM type 2, macular degeneration, HTN, HLD, pneumonia, colon cancer and cognitive impairment, who presented to ED as code stroke after reported fall. When found, pt was nonverbal with AMS. Per MD note, pt unable to communicate normally at baseline. MR Brain 4/13 multifocal areas of acute nonhemorrhagic infarction, subcentimeter in size of symmetric nature affecting cortex, subcortical white matter, basal ganglia and cerebellum. These are most consistent with watershed infarcts, commonly associated with cardiac arrhythmia or hypotensive event. Shower of emboli not excluded but less favored. CXR 4/12 findings consistent with congestive heart failure. No airspace consolidation.      SLP Plan  Continue with current plan of care     Recommendations  Diet recommendations: Regular;Thin liquid Liquids provided via: Cup;Straw Medication Administration: Whole meds with liquid Supervision: Patient able to self feed;Intermittent supervision to cue for compensatory strategies Compensations: Slow rate;Small sips/bites Postural Changes and/or Swallow Maneuvers: Seated upright 90 degrees             Oral Care Recommendations: Oral care BID Follow up Recommendations: None Plan: Continue with current plan of care             Houston Siren 12/29/2015, 11:59 AM  Orbie Pyo Colvin Caroli.Ed [employer] [phone]

## 2015-12-29 NOTE — Progress Notes (Signed)
ANTICOAGULATION CONSULT NOTE - [place] for Heparin  Indication: chest pain/ACS, elevated troponin  Allergies  Allergen Reactions  . Codeine Nausea Only   Patient Measurements: Height: 5\' 2"  (157.5 cm) Weight: 143 lb 8.3 oz (65.1 kg) IBW/kg (Calculated) : 50.1  Vital Signs: Temp: 97.3 F (36.3 C) (04/15 0800) Temp Source: Oral (04/15 0800) BP: 121/80 mmHg (04/15 0800) Pulse Rate: 84 (04/15 0800)  Labs:  Recent Labs  12/26/15 2141  12/27/15 2131  12/28/15 1125 12/28/15 1500 12/28/15 2154 12/29/15 0400  HGB 11.8*  --  10.7*  --   --  10.4*  --  10.6*  HCT 35.5*  --  33.0*  --   --  32.8*  --  33.7*  PLT 147*  --  137*  --   --  134*  --  136*  APTT 21*  --   --   --   --   --   --   --   LABPROT 12.7  --   --   --   --   --   --   --   INR 0.94  --   --   --   --   --   --   --   HEPARINUNFRC  --   < >  --   < > 0.56  --  0.34 0.23*  CREATININE 1.37*  < > 2.67*  --   --  2.18*  --  2.00*  TROPONINI  --   < > 35.31*  < >  --  26.53* 27.25* 23.77*  < > = values in this interval not displayed.  Medical History: Past Medical History  Diagnosis Date  . Type 2 diabetes mellitus ([place])   . Colon cancer ([place])   . Anemia   . Essential hypertension, benign   . Mixed hyperlipidemia   . Macular degeneration   . History of pneumonia   . Iron deficiency anemia     Erosive antral gastritis  . Cognitive impairment     Assessment: 73 yoF with elevated troponin and watershed infarcts on heparin, troponin peaked (27.25) and trending down.   HL now subtherapeutic at 0.23 on 700 units/hr. Previously was 0.56 on 800 units/hr, but we are targeting a lower goal given watershed infarcts.  Hgb 10.6, Plt 136, no bleeding noted.  Goal of Therapy:  Heparin level 0.3-0.5 units/ml Monitor platelets by anticoagulation protocol: Yes   Plan:  -Increase to heparin at 750 units/hr -HL in 8 hr -Daily HL and CBC  Governor Specking, PharmD Clinical Pharmacy  Resident Pager: [phone] 12/29/2015,8:57 AM

## 2015-12-29 NOTE — Progress Notes (Signed)
MD notified that patients daughter wishes patient to be administered home meds celexa and remeron. Text page sent to MD.

## 2015-12-29 NOTE — Progress Notes (Signed)
ANTICOAGULATION CONSULT NOTE - [place] for Heparin  Indication: chest pain/ACS, elevated troponin  Allergies  Allergen Reactions  . Codeine Nausea Only   Patient Measurements: Height: 5\' 2"  (157.5 cm) Weight: 143 lb 8.3 oz (65.1 kg) IBW/kg (Calculated) : 50.1  Vital Signs: Temp: 98 F (36.7 C) (04/15 1600) Temp Source: Oral (04/15 1600) BP: 120/70 mmHg (04/15 1600) Pulse Rate: 86 (04/15 1600)  Labs:  Recent Labs  12/26/15 2141  12/27/15 2131  12/28/15 1500 12/28/15 2154 12/29/15 0400 12/29/15 1157 12/29/15 1736  HGB 11.8*  --  10.7*  --  10.4*  --  10.6*  --   --   HCT 35.5*  --  33.0*  --  32.8*  --  33.7*  --   --   PLT 147*  --  137*  --  134*  --  136*  --   --   APTT 21*  --   --   --   --   --   --   --   --   LABPROT 12.7  --   --   --   --   --   --   --   --   INR 0.94  --   --   --   --   --   --   --   --   HEPARINUNFRC  --   < >  --   < >  --  0.34 0.23*  --  0.21*  CREATININE 1.37*  < > 2.67*  --  2.18*  --  2.00*  --   --   TROPONINI  --   < > 35.31*  < > 26.53* 27.25* 23.77* 19.70*  --   < > = values in this interval not displayed.  Medical History: Past Medical History  Diagnosis Date  . Type 2 diabetes mellitus ([place])   . Colon cancer ([place])   . Anemia   . Essential hypertension, benign   . Mixed hyperlipidemia   . Macular degeneration   . History of pneumonia   . Iron deficiency anemia     Erosive antral gastritis  . Cognitive impairment     Assessment: 71 yoF with elevated troponin and watershed infarcts on heparin, troponin peaked (27.25) and trending down.   HL still low at 0.21 on 750 units/hr. Will adjust  Hgb 10.6, Plt 136, no bleeding noted.  Goal of Therapy:  Heparin level 0.3-0.5 units/ml Monitor platelets by anticoagulation protocol: Yes   Plan:  -Increase to heparin at 850 units/hr -HL in 8 hr -Daily HL and CBC  Erin Hearing PharmD., BCPS Clinical Pharmacist Pager [phone] 12/29/2015  6:31 PM

## 2015-12-29 NOTE — Progress Notes (Addendum)
CARDIOLOGY ROUNDING NOTE    Patient Name:  Laura Parks, DOB: Jul 06, 1930, MRN: [id number] Primary Doctor: Glenda Chroman., MD Primary Cardiologist:   Date: 12/29/2015   SUBJECTIVE:    Patient awake and alert now. Denies CP. Brain MRI with multiple watershed infarcts. Troponin peaked at 35 and now coming down.  ECG from yesterday looks like AF (versus sinus with frequent PACs)  Echo EF 60% Mild AS. Minimal carotid disease    Past Medical History  Diagnosis Date  . Type 2 diabetes mellitus ([place])   . Colon cancer ([place])   . Anemia   . Essential hypertension, benign   . Mixed hyperlipidemia   . Macular degeneration   . History of pneumonia   . Iron deficiency anemia     Erosive antral gastritis  . Cognitive impairment     . antiseptic oral rinse  7 mL Mouth Rinse BID  . aspirin  300 mg Rectal Daily   Or  . aspirin  325 mg Oral Daily  . atorvastatin  10 mg Oral Daily  . cefTRIAXone (ROCEPHIN)  IV  1 g Intravenous Q24H  . cholestyramine light  4 g Oral BID  . insulin aspart  0-9 Units Subcutaneous 6 times per day   Filed Vitals:   12/28/15 2000 12/29/15 0000 12/29/15 0438 12/29/15 0800  BP: 118/70 131/84  121/80  Pulse: 84 83  84  Temp: 98.9 F (37.2 C) 97.5 F (36.4 C) 97.3 F (36.3 C) 97.3 F (36.3 C)  TempSrc: Axillary Oral  Oral  Resp: 16 19  12   Height:      Weight:      SpO2: 95% 97%  97%    Intake/Output Summary (Last 24 hours) at 12/29/15 1212 Last data filed at 12/29/15 0300  Gross per 24 hour  Intake 2707.78 ml  Output      0 ml  Net 2707.78 ml   Filed Weights   12/26/15 2206 12/27/15 0317 12/27/15 2035  Weight: 58.968 kg (130 lb) 67.4 kg (148 lb 9.4 oz) 65.1 kg (143 lb 8.3 oz)     LABS: Basic Metabolic Panel:  Recent Labs  12/28/15 1500 12/29/15 0400  NA 143 145  K 4.9 5.0  CL 116* 115*  CO2 12* 20*  GLUCOSE 123* 109*  BUN 49* 51*  CREATININE 2.18* 2.00*  CALCIUM 8.5* 8.6*   Liver Function Tests:  Recent Labs  12/26/15 2248  12/27/15 2131  AST 23 98*  ALT 14 35  ALKPHOS 80 63  BILITOT 0.8 0.8  PROT 6.5 6.1*  ALBUMIN 3.6 3.4*   No results for input(s): LIPASE, AMYLASE in the last 72 hours. CBC:  Recent Labs  12/26/15 2141 12/27/15 2131 12/28/15 1500 12/29/15 0400  WBC 9.9 13.4* 16.6* 13.8*  NEUTROABS 4.8 10.8*  --   --   HGB 11.8* 10.7* 10.4* 10.6*  HCT 35.5* 33.0* 32.8* 33.7*  MCV 86.4 85.9 86.1 86.6  PLT 147* 137* 134* 136*   Cardiac Enzymes:  Recent Labs  12/28/15 1500 12/28/15 2154 12/29/15 0400  TROPONINI 26.53* 27.25* 23.77*   BNP: Invalid input(s): POCBNP D-Dimer: No results for input(s): DDIMER in the last 72 hours. Thyroid Function Tests: No results for input(s): TSH, T4TOTAL, T3FREE, THYROIDAB in the last 72 hours.  Invalid input(s): FREET3  RADIOLOGY: Ct Head Wo Contrast  12/26/2015  ADDENDUM REPORT: 12/26/2015 22:37 ADDENDUM: Critical Value/emergent results were called by telephone at the time of interpretation on 12/26/2015 at 10:30 pm to Dr. Nicole Kindred, neurology, who  verbally acknowledged these results. Electronically Signed   By: Lowella Grip III M.D.   On: 12/26/2015 22:37  12/26/2015  CLINICAL DATA:  Confusion and agitation EXAM: CT HEAD WITHOUT CONTRAST TECHNIQUE: Contiguous axial images were obtained from the base of the skull through the vertex without intravenous contrast. COMPARISON:  Brain MRI August 09, 2014 FINDINGS: Motion artifact makes this study less than optimal. There is moderate diffuse atrophy. There is no intracranial mass, hemorrhage, extra-axial fluid collection, or midline shift. There is patchy small vessel disease in the centra semiovale bilaterally. No acute infarct is evident. The middle cerebral artery attenuation is symmetric bilaterally. IMPRESSION: Suboptimal study due to motion artifact. Atrophy with patchy periventricular small vessel disease. No acute infarct is evident. No hemorrhage or mass effect. Note that subtle early infarct could be  obscured given this degree of motion artifact, particularly in the posterior fossa region. Electronically Signed: By: Lowella Grip III M.D. On: 12/26/2015 21:59   Mr Brain Wo Contrast  12/27/2015  CLINICAL DATA:  Patient found down. Erratic behavior. Word salad, facial droop. Not recognizing family. Confusion and agitation. Hypertension. Diabetes. EXAM: MRI HEAD WITHOUT CONTRAST MRA HEAD WITHOUT CONTRAST TECHNIQUE: Multiplanar, multiecho pulse sequences of the brain and surrounding structures were obtained without intravenous contrast. Angiographic images of the head were obtained using MRA technique without contrast. COMPARISON:  CT head 12/26/2015.  MR head 08/09/2014. FINDINGS: MRI HEAD FINDINGS Multifocal subcentimeter areas of restricted diffusion throughout both cerebral hemispheres, also involving the basal ganglia, also involving BILATERAL cerebellum in a symmetric fashion, consistent with watershed infarcts. Multiple emboli are not excluded but less favored, given symmetry and multiplicity. No hemorrhage, mass lesion,  or extra-axial fluid. Global atrophy. Chronic microvascular ischemic change. Remote lacunar infarcts. Flow voids are maintained. No midline abnormalities. Extracranial soft tissues unremarkable. BILATERAL cataract extraction. Compared with prior CT, the infarcts are not visible. Compared with prior MR, the infarcts are new. MRA HEAD FINDINGS Anterior circulation: Moderate irregularity of the cavernous ICA on the RIGHT, potential 50-75% stenosis. Mild narrowing of the supraclinoid ICA on the RIGHT. LEFT ICA demonstrates mild non stenotic irregularity throughout its skullbase and supraclinoid segments. No MCA stenosis or occlusion. Moderate irregularity of the non dominant A1 ACA on the RIGHT. Moderate irregularity of the distal MCA branches, insufficiently well-visualized to characterize stenosis versus occlusion distally. Unremarkable distal anterior cerebral arteries. Posterior  circulation: Basilar artery widely patent. Vertebrals codominant. Unremarkable posterior cerebral arteries. No visible cerebellar branch occlusion. No visible intracranial aneurysm. IMPRESSION: Multifocal areas of acute nonhemorrhagic infarction, subcentimeter in size of a symmetric nature affecting the cortex, subcortical white matter, basal ganglia, and cerebellum. These are most consistent with watershed infarcts, commonly associated with cardiac arrhythmia or hypotensive event. Shower of emboli not excluded but less favored. Atrophy and small vessel disease similar to priors. No large vessel occlusion on MRA. Potentially flow reducing stenosis of the cavernous ICA on the RIGHT, 50-75%. Electronically Signed   By: Staci Righter M.D.   On: 12/27/2015 10:09   Ct Abdomen Pelvis W Contrast  12/27/2015  CLINICAL DATA:  Vomiting.  History colon cancer. EXAM: CT ABDOMEN AND PELVIS WITH CONTRAST TECHNIQUE: Multidetector CT imaging of the abdomen and pelvis was performed using the standard protocol following bolus administration of intravenous contrast. CONTRAST:  7mL ISOVUE-300 IOPAMIDOL (ISOVUE-300) INJECTION 61% COMPARISON:  None. FINDINGS: Lower chest: Mild pleural effusion bilaterally with mild compressive atelectasis in the bases. Extensive calcification of the mitral annulus. Heart size normal Hepatobiliary: Postop cholecystectomy. Bile ducts nondilated.  No liver lesion. Pancreas: Negative Spleen: Negative Adrenals/Urinary Tract: Negative for renal obstruction. No mass or stone. Urinary bladder normal. Stomach/Bowel: Negative for bowel obstruction. Moderate sigmoid diverticulosis. Negative for diverticulitis. Surgical clips in the cecum presumably from appendectomy. No evidence of acute appendicitis. Vascular/Lymphatic: Atherosclerotic calcification in the aorta and iliac arteries. No aneurysm. Negative for lymphadenopathy Reproductive: Negative for a uterine mass.  No pelvic mass. Other: No free-fluid  Musculoskeletal: Limit evaluation of the lumbar spine due to motion. Image quality degraded by motion. The patient moved significantly midway through the scan IMPRESSION: Small bilateral effusions Sigmoid diverticulosis. Negative for diverticulitis. Probable appendectomy clips in the cecum. Electronically Signed   By: Franchot Gallo M.D.   On: 12/27/2015 14:34   US Renal  12/28/2015  CLINICAL DATA:  Acute renal failure EXAM: RENAL / URINARY TRACT ULTRASOUND COMPLETE COMPARISON:  CT abdomen 12/27/2015 FINDINGS: Right Kidney: Length: 9.4 cm. Mild cortical thinning. Normal renal cortical echogenicity. No hydronephrosis. Left Kidney: Length: 9.5 cm. Mild cortical thinning. Normal renal cortical echogenicity. No hydronephrosis. Bladder: Appears normal for degree of bladder distention. Right pleural effusion. IMPRESSION: No hydronephrosis. Mild renal atrophy. Electronically Signed   By: Lovey Newcomer M.D.   On: 12/28/2015 17:17   Dg Chest Port 1 View  12/26/2015  CLINICAL DATA:  Shortness of breath with altered mental status EXAM: PORTABLE CHEST 1 VIEW COMPARISON:  December 10, 2010 FINDINGS: There is diffuse interstitial edema. There is cardiomegaly with pulmonary venous hypertension. No airspace consolidation. No adenopathy. There is calcification in the mitral annulus. There is degenerative change in the thoracic spine. IMPRESSION: Findings consistent with congestive heart failure. No airspace consolidation. Electronically Signed   By: Lowella Grip III M.D.   On: 12/26/2015 23:33   Mr Jodene Nam Head/brain Wo Cm  12/27/2015  CLINICAL DATA:  Patient found down. Erratic behavior. Word salad, facial droop. Not recognizing family. Confusion and agitation. Hypertension. Diabetes. EXAM: MRI HEAD WITHOUT CONTRAST MRA HEAD WITHOUT CONTRAST TECHNIQUE: Multiplanar, multiecho pulse sequences of the brain and surrounding structures were obtained without intravenous contrast. Angiographic images of the head were obtained using  MRA technique without contrast. COMPARISON:  CT head 12/26/2015.  MR head 08/09/2014. FINDINGS: MRI HEAD FINDINGS Multifocal subcentimeter areas of restricted diffusion throughout both cerebral hemispheres, also involving the basal ganglia, also involving BILATERAL cerebellum in a symmetric fashion, consistent with watershed infarcts. Multiple emboli are not excluded but less favored, given symmetry and multiplicity. No hemorrhage, mass lesion,  or extra-axial fluid. Global atrophy. Chronic microvascular ischemic change. Remote lacunar infarcts. Flow voids are maintained. No midline abnormalities. Extracranial soft tissues unremarkable. BILATERAL cataract extraction. Compared with prior CT, the infarcts are not visible. Compared with prior MR, the infarcts are new. MRA HEAD FINDINGS Anterior circulation: Moderate irregularity of the cavernous ICA on the RIGHT, potential 50-75% stenosis. Mild narrowing of the supraclinoid ICA on the RIGHT. LEFT ICA demonstrates mild non stenotic irregularity throughout its skullbase and supraclinoid segments. No MCA stenosis or occlusion. Moderate irregularity of the non dominant A1 ACA on the RIGHT. Moderate irregularity of the distal MCA branches, insufficiently well-visualized to characterize stenosis versus occlusion distally. Unremarkable distal anterior cerebral arteries. Posterior circulation: Basilar artery widely patent. Vertebrals codominant. Unremarkable posterior cerebral arteries. No visible cerebellar branch occlusion. No visible intracranial aneurysm. IMPRESSION: Multifocal areas of acute nonhemorrhagic infarction, subcentimeter in size of a symmetric nature affecting the cortex, subcortical white matter, basal ganglia, and cerebellum. These are most consistent with watershed infarcts, commonly associated with cardiac arrhythmia or hypotensive event.  Shower of emboli not excluded but less favored. Atrophy and small vessel disease similar to priors. No large vessel  occlusion on MRA. Potentially flow reducing stenosis of the cavernous ICA on the RIGHT, 50-75%. Electronically Signed   By: Staci Righter M.D.   On: 12/27/2015 10:09     TELEMETRY:   I have reviewed telemetry today December 29, 2015. There is normal sinus rhythm. No atrial fibrillation has been seen on the monitor.  ASSESSMENT AND PLAN:  1) Acute encephalopathy - resolving     MRI has shown multiple watershed infarcts. Neurology  questions  the possibility of cardiogenic emboli versus hypotensive episode. Echo normal.  2) Markedly elevated troponin/NSTEMI    --assume cardiac but echo without wall motion abnormalities. Possible neuro related but troponin is quite high. Given AKI and brain MRI findings may have been hypotensive episode with related demand coronary ischemia 3) AKI improving   --suspect ATN from hypotension 4) Probable PAF on ECG not seen on monitor  Difficult situation. I suspect watershed infarcts, AKI and troponin possibly related to hypotensive episode but inciting event unclear. Echo reviewed personally and no obvious WMAs. Yesterday's ECG looks like AF but no AF on tele.   I suspect trigger may have been PAF with RVR but hard to know. Would continue heparin. If she continues to recover would favor cardiac cath next week when renal function back to baseline. Continue ASA, statin, heparin. No b-blocker yet as BP too soft and don't want to drop further.   Discussed with family ar bedside.    Amiliah Campisi,MD 12:21 PM

## 2015-12-29 NOTE — Progress Notes (Signed)
Bladder scan reveals 750 cc urine. Foley removed (still no output). New order for IN/Out cath received. Performed after peri care with sterile technique. 750 urine drained. Bladder scan after procedure shows 0cc. Patient had no output from foley even with movement and repositioning.

## 2015-12-29 NOTE — Progress Notes (Signed)
Left message with daughter [name] via phone as per her request. Provided update on clinical condition, presentation, as well as laboratory values. Patient is much more alert today, conversant, asking for something to eat and drink. Again, as per goals of care discussion held for 12/28/15 with family, they are desirous of full aggressive treatment including CPR intubation defibrillation and pressors. Thank you for the consult, Romona Curls, NP

## 2015-12-29 NOTE — Progress Notes (Signed)
Patient chart revelead no urine output since placement of foley 4/14. Foley was repositioned, flushed, replaced with new foley and flushed again, still with no output. Bladder scan reveals 500cc. MD notified for further interventions.

## 2015-12-30 ENCOUNTER — Inpatient Hospital Stay (HOSPITAL_COMMUNITY): Payer: Medicare Other

## 2015-12-30 DIAGNOSIS — I480 Paroxysmal atrial fibrillation: Secondary | ICD-10-CM | POA: Diagnosis present

## 2015-12-30 DIAGNOSIS — I6340 Cerebral infarction due to embolism of unspecified cerebral artery: Secondary | ICD-10-CM

## 2015-12-30 LAB — CBC
HCT: 32.2 % — ABNORMAL LOW (ref 36.0–46.0)
Hemoglobin: 10.2 g/dL — ABNORMAL LOW (ref 12.0–15.0)
MCH: 27.5 pg (ref 26.0–34.0)
MCHC: 31.7 g/dL (ref 30.0–36.0)
MCV: 86.8 fL (ref 78.0–100.0)
PLATELETS: 145 10*3/uL — AB (ref 150–400)
RBC: 3.71 MIL/uL — AB (ref 3.87–5.11)
RDW: 14.9 % (ref 11.5–15.5)
WBC: 10.3 10*3/uL (ref 4.0–10.5)

## 2015-12-30 LAB — TROPONIN I
TROPONIN I: 15.29 ng/mL — AB (ref <0.031)
Troponin I: 12.92 ng/mL (ref <0.031)
Troponin I: 9.61 ng/mL (ref <0.031)
Troponin I: 9.71 ng/mL (ref <0.031)

## 2015-12-30 LAB — COMPREHENSIVE METABOLIC PANEL
ALBUMIN: 2.8 g/dL — AB (ref 3.5–5.0)
ALK PHOS: 55 U/L (ref 38–126)
ALT: 103 U/L — AB (ref 14–54)
AST: 103 U/L — ABNORMAL HIGH (ref 15–41)
Anion gap: 7 (ref 5–15)
BILIRUBIN TOTAL: 0.5 mg/dL (ref 0.3–1.2)
BUN: 47 mg/dL — ABNORMAL HIGH (ref 6–20)
CHLORIDE: 112 mmol/L — AB (ref 101–111)
CO2: 19 mmol/L — ABNORMAL LOW (ref 22–32)
Calcium: 8.4 mg/dL — ABNORMAL LOW (ref 8.9–10.3)
Creatinine, Ser: 1.68 mg/dL — ABNORMAL HIGH (ref 0.44–1.00)
GFR calc Af Amer: 31 mL/min — ABNORMAL LOW (ref >60)
GFR calc non Af Amer: 27 mL/min — ABNORMAL LOW (ref >60)
Glucose, Bld: 150 mg/dL — ABNORMAL HIGH (ref 65–99)
Potassium: 4.5 mmol/L (ref 3.5–5.1)
SODIUM: 138 mmol/L (ref 135–145)
Total Protein: 5.3 g/dL — ABNORMAL LOW (ref 6.5–8.1)

## 2015-12-30 LAB — GLUCOSE, CAPILLARY
GLUCOSE-CAPILLARY: 132 mg/dL — AB (ref 65–99)
GLUCOSE-CAPILLARY: 139 mg/dL — AB (ref 65–99)
GLUCOSE-CAPILLARY: 142 mg/dL — AB (ref 65–99)
GLUCOSE-CAPILLARY: 147 mg/dL — AB (ref 65–99)
GLUCOSE-CAPILLARY: 62 mg/dL — AB (ref 65–99)
Glucose-Capillary: 86 mg/dL (ref 65–99)
Glucose-Capillary: 111 mg/dL — ABNORMAL HIGH (ref 65–99)
Glucose-Capillary: 288 mg/dL — ABNORMAL HIGH (ref 65–99)

## 2015-12-30 LAB — HEPARIN LEVEL (UNFRACTIONATED)
HEPARIN UNFRACTIONATED: 0.24 [IU]/mL — AB (ref 0.30–0.70)
HEPARIN UNFRACTIONATED: 0.35 [IU]/mL (ref 0.30–0.70)

## 2015-12-30 MED ORDER — INSULIN ASPART 100 UNIT/ML ~~LOC~~ SOLN: 0.0000 [IU] | Freq: Three times a day (TID) | SUBCUTANEOUS | Status: DC

## 2015-12-30 MED ORDER — HALOPERIDOL LACTATE 5 MG/ML IJ SOLN
2.0000 mg | Freq: Once | INTRAMUSCULAR | Status: AC
  Administered 2015-12-30: 2 mg via INTRAVENOUS
  Filled 2015-12-30 (×2): qty 1

## 2015-12-30 MED ORDER — LORAZEPAM 2 MG/ML IJ SOLN
1.0000 mg | Freq: Once | INTRAMUSCULAR | Status: AC
  Administered 2015-12-31: 1 mg via INTRAVENOUS
  Filled 2015-12-30: qty 1

## 2015-12-30 MED ORDER — INSULIN ASPART 100 UNIT/ML ~~LOC~~ SOLN
0.0000 [IU] | Freq: Three times a day (TID) | SUBCUTANEOUS | Status: DC
  Administered 2015-12-30: 1 [IU] via SUBCUTANEOUS
  Administered 2015-12-31 – 2016-01-02 (×3): 2 [IU] via SUBCUTANEOUS
  Administered 2016-01-02: 1 [IU] via SUBCUTANEOUS
  Administered 2016-01-03: 3 [IU] via SUBCUTANEOUS

## 2015-12-30 NOTE — Progress Notes (Signed)
Patient has had increasing confusion thoroughout the day and has repeatedly attempted to get out of bed or out of chair without staff assistance and remove all patient care equipment after numerous re-education/re-orientation attempts. Patient at this time is in bed wearing safety mitts with bed alarm on most sensitive setting.

## 2015-12-30 NOTE — Progress Notes (Signed)
CARDIOLOGY ROUNDING NOTE    Patient Name:  Laura Parks, DOB: [birth year], MRN: [id number] Primary Doctor: Glenda Chroman., MD Primary Cardiologist:   Date: 12/30/2015   SUBJECTIVE:    Patient awake and alert. Sitting in chair. Knows year but thinks she is at [facility] Denies CP.  Remains in NSR. Renal function improving. BP improved.    Echo EF 60% Mild AS. Minimal carotid disease    Past Medical History  Diagnosis Date  . Type 2 diabetes mellitus ([place])   . Colon cancer ([place])   . Anemia   . Essential hypertension, benign   . Mixed hyperlipidemia   . Macular degeneration   . History of pneumonia   . Iron deficiency anemia     Erosive antral gastritis  . Cognitive impairment     . antiseptic oral rinse  7 mL Mouth Rinse BID  . aspirin  300 mg Rectal Daily   Or  . aspirin  325 mg Oral Daily  . atorvastatin  10 mg Oral Daily  . cefTRIAXone (ROCEPHIN)  IV  1 g Intravenous Q24H  . cholestyramine light  4 g Oral BID  . citalopram  20 mg Oral Daily  . insulin aspart  0-9 Units Subcutaneous 6 times per day  . mirtazapine  15 mg Oral QHS   Filed Vitals:   12/30/15 0031 12/30/15 0400 12/30/15 0434 12/30/15 0808  BP:  118/71  127/81  Pulse:  78  79  Temp: 98.6 F (37 C) 98.3 F (36.8 C)  98.2 F (36.8 C)  TempSrc: Oral Oral  Oral  Resp:  25  17  Height:      Weight:   68.3 kg (150 lb 9.2 oz)   SpO2:  98%  98%    Intake/Output Summary (Last 24 hours) at 12/30/15 1202 Last data filed at 12/30/15 [id number]  Gross per 24 hour  Intake 2456.7 ml  Output    575 ml  Net 1881.7 ml   Filed Weights   12/27/15 2035 12/29/15 0800 12/30/15 0434  Weight: 65.1 kg (143 lb 8.3 oz) 95.301 kg (210 lb 1.6 oz) 68.3 kg (150 lb 9.2 oz)     LABS: Basic Metabolic Panel:  Recent Labs  12/29/15 0400 12/30/15 0115  NA 145 138  K 5.0 4.5  CL 115* 112*  CO2 20* 19*  GLUCOSE 109* 150*  BUN 51* 47*  CREATININE 2.00* 1.68*  CALCIUM 8.6* 8.4*   Liver Function  Tests:  Recent Labs  12/27/15 2131 12/30/15 0115  AST 98* 103*  ALT 35 103*  ALKPHOS 63 55  BILITOT 0.8 0.5  PROT 6.1* 5.3*  ALBUMIN 3.4* 2.8*   No results for input(s): LIPASE, AMYLASE in the last 72 hours. CBC:  Recent Labs  12/27/15 2131  12/29/15 0400 12/30/15 0115  WBC 13.4*  < > 13.8* 10.3  NEUTROABS 10.8*  --   --   --   HGB 10.7*  < > 10.6* 10.2*  HCT 33.0*  < > 33.7* 32.2*  MCV 85.9  < > 86.6 86.8  PLT 137*  < > 136* 145*  < > = values in this interval not displayed. Cardiac Enzymes:  Recent Labs  12/29/15 1825 12/30/15 0115 12/30/15 0626  TROPONINI 17.60* 15.29* 12.92*   BNP: Invalid input(s): POCBNP D-Dimer: No results for input(s): DDIMER in the last 72 hours. Thyroid Function Tests: No results for input(s): TSH, T4TOTAL, T3FREE, THYROIDAB in the last 72 hours.  Invalid input(s): FREET3  RADIOLOGY: Ct  Head Wo Contrast  12/26/2015  ADDENDUM REPORT: 12/26/2015 22:37 ADDENDUM: Critical Value/emergent results were called by telephone at the time of interpretation on 12/26/2015 at 10:30 pm to Dr. Nicole Kindred, neurology, who verbally acknowledged these results. Electronically Signed   By: Lowella Grip III M.D.   On: 12/26/2015 22:37  12/26/2015  CLINICAL DATA:  Confusion and agitation EXAM: CT HEAD WITHOUT CONTRAST TECHNIQUE: Contiguous axial images were obtained from the base of the skull through the vertex without intravenous contrast. COMPARISON:  Brain MRI August 09, 2014 FINDINGS: Motion artifact makes this study less than optimal. There is moderate diffuse atrophy. There is no intracranial mass, hemorrhage, extra-axial fluid collection, or midline shift. There is patchy small vessel disease in the centra semiovale bilaterally. No acute infarct is evident. The middle cerebral artery attenuation is symmetric bilaterally. IMPRESSION: Suboptimal study due to motion artifact. Atrophy with patchy periventricular small vessel disease. No acute infarct is  evident. No hemorrhage or mass effect. Note that subtle early infarct could be obscured given this degree of motion artifact, particularly in the posterior fossa region. Electronically Signed: By: Lowella Grip III M.D. On: 12/26/2015 21:59   Mr Brain Wo Contrast  12/27/2015  CLINICAL DATA:  Patient found down. Erratic behavior. Word salad, facial droop. Not recognizing family. Confusion and agitation. Hypertension. Diabetes. EXAM: MRI HEAD WITHOUT CONTRAST MRA HEAD WITHOUT CONTRAST TECHNIQUE: Multiplanar, multiecho pulse sequences of the brain and surrounding structures were obtained without intravenous contrast. Angiographic images of the head were obtained using MRA technique without contrast. COMPARISON:  CT head 12/26/2015.  MR head 08/09/2014. FINDINGS: MRI HEAD FINDINGS Multifocal subcentimeter areas of restricted diffusion throughout both cerebral hemispheres, also involving the basal ganglia, also involving BILATERAL cerebellum in a symmetric fashion, consistent with watershed infarcts. Multiple emboli are not excluded but less favored, given symmetry and multiplicity. No hemorrhage, mass lesion,  or extra-axial fluid. Global atrophy. Chronic microvascular ischemic change. Remote lacunar infarcts. Flow voids are maintained. No midline abnormalities. Extracranial soft tissues unremarkable. BILATERAL cataract extraction. Compared with prior CT, the infarcts are not visible. Compared with prior MR, the infarcts are new. MRA HEAD FINDINGS Anterior circulation: Moderate irregularity of the cavernous ICA on the RIGHT, potential 50-75% stenosis. Mild narrowing of the supraclinoid ICA on the RIGHT. LEFT ICA demonstrates mild non stenotic irregularity throughout its skullbase and supraclinoid segments. No MCA stenosis or occlusion. Moderate irregularity of the non dominant A1 ACA on the RIGHT. Moderate irregularity of the distal MCA branches, insufficiently well-visualized to characterize stenosis versus  occlusion distally. Unremarkable distal anterior cerebral arteries. Posterior circulation: Basilar artery widely patent. Vertebrals codominant. Unremarkable posterior cerebral arteries. No visible cerebellar branch occlusion. No visible intracranial aneurysm. IMPRESSION: Multifocal areas of acute nonhemorrhagic infarction, subcentimeter in size of a symmetric nature affecting the cortex, subcortical white matter, basal ganglia, and cerebellum. These are most consistent with watershed infarcts, commonly associated with cardiac arrhythmia or hypotensive event. Shower of emboli not excluded but less favored. Atrophy and small vessel disease similar to priors. No large vessel occlusion on MRA. Potentially flow reducing stenosis of the cavernous ICA on the RIGHT, 50-75%. Electronically Signed   By: Staci Righter M.D.   On: 12/27/2015 10:09   Ct Abdomen Pelvis W Contrast  12/27/2015  CLINICAL DATA:  Vomiting.  History colon cancer. EXAM: CT ABDOMEN AND PELVIS WITH CONTRAST TECHNIQUE: Multidetector CT imaging of the abdomen and pelvis was performed using the standard protocol following bolus administration of intravenous contrast. CONTRAST:  29mL ISOVUE-300 IOPAMIDOL (ISOVUE-300) INJECTION  61% COMPARISON:  None. FINDINGS: Lower chest: Mild pleural effusion bilaterally with mild compressive atelectasis in the bases. Extensive calcification of the mitral annulus. Heart size normal Hepatobiliary: Postop cholecystectomy. Bile ducts nondilated. No liver lesion. Pancreas: Negative Spleen: Negative Adrenals/Urinary Tract: Negative for renal obstruction. No mass or stone. Urinary bladder normal. Stomach/Bowel: Negative for bowel obstruction. Moderate sigmoid diverticulosis. Negative for diverticulitis. Surgical clips in the cecum presumably from appendectomy. No evidence of acute appendicitis. Vascular/Lymphatic: Atherosclerotic calcification in the aorta and iliac arteries. No aneurysm. Negative for lymphadenopathy  Reproductive: Negative for a uterine mass.  No pelvic mass. Other: No free-fluid Musculoskeletal: Limit evaluation of the lumbar spine due to motion. Image quality degraded by motion. The patient moved significantly midway through the scan IMPRESSION: Small bilateral effusions Sigmoid diverticulosis. Negative for diverticulitis. Probable appendectomy clips in the cecum. Electronically Signed   By: Franchot Gallo M.D.   On: 12/27/2015 14:34   US Renal  12/28/2015  CLINICAL DATA:  Acute renal failure EXAM: RENAL / URINARY TRACT ULTRASOUND COMPLETE COMPARISON:  CT abdomen 12/27/2015 FINDINGS: Right Kidney: Length: 9.4 cm. Mild cortical thinning. Normal renal cortical echogenicity. No hydronephrosis. Left Kidney: Length: 9.5 cm. Mild cortical thinning. Normal renal cortical echogenicity. No hydronephrosis. Bladder: Appears normal for degree of bladder distention. Right pleural effusion. IMPRESSION: No hydronephrosis. Mild renal atrophy. Electronically Signed   By: Lovey Newcomer M.D.   On: 12/28/2015 17:17   Dg Chest Port 1 View  12/26/2015  CLINICAL DATA:  Shortness of breath with altered mental status EXAM: PORTABLE CHEST 1 VIEW COMPARISON:  December 10, 2010 FINDINGS: There is diffuse interstitial edema. There is cardiomegaly with pulmonary venous hypertension. No airspace consolidation. No adenopathy. There is calcification in the mitral annulus. There is degenerative change in the thoracic spine. IMPRESSION: Findings consistent with congestive heart failure. No airspace consolidation. Electronically Signed   By: Lowella Grip III M.D.   On: 12/26/2015 23:33   Mr Jodene Nam Head/brain Wo Cm  12/27/2015  CLINICAL DATA:  Patient found down. Erratic behavior. Word salad, facial droop. Not recognizing family. Confusion and agitation. Hypertension. Diabetes. EXAM: MRI HEAD WITHOUT CONTRAST MRA HEAD WITHOUT CONTRAST TECHNIQUE: Multiplanar, multiecho pulse sequences of the brain and surrounding structures were obtained  without intravenous contrast. Angiographic images of the head were obtained using MRA technique without contrast. COMPARISON:  CT head 12/26/2015.  MR head 08/09/2014. FINDINGS: MRI HEAD FINDINGS Multifocal subcentimeter areas of restricted diffusion throughout both cerebral hemispheres, also involving the basal ganglia, also involving BILATERAL cerebellum in a symmetric fashion, consistent with watershed infarcts. Multiple emboli are not excluded but less favored, given symmetry and multiplicity. No hemorrhage, mass lesion,  or extra-axial fluid. Global atrophy. Chronic microvascular ischemic change. Remote lacunar infarcts. Flow voids are maintained. No midline abnormalities. Extracranial soft tissues unremarkable. BILATERAL cataract extraction. Compared with prior CT, the infarcts are not visible. Compared with prior MR, the infarcts are new. MRA HEAD FINDINGS Anterior circulation: Moderate irregularity of the cavernous ICA on the RIGHT, potential 50-75% stenosis. Mild narrowing of the supraclinoid ICA on the RIGHT. LEFT ICA demonstrates mild non stenotic irregularity throughout its skullbase and supraclinoid segments. No MCA stenosis or occlusion. Moderate irregularity of the non dominant A1 ACA on the RIGHT. Moderate irregularity of the distal MCA branches, insufficiently well-visualized to characterize stenosis versus occlusion distally. Unremarkable distal anterior cerebral arteries. Posterior circulation: Basilar artery widely patent. Vertebrals codominant. Unremarkable posterior cerebral arteries. No visible cerebellar branch occlusion. No visible intracranial aneurysm. IMPRESSION: Multifocal areas of acute nonhemorrhagic  infarction, subcentimeter in size of a symmetric nature affecting the cortex, subcortical white matter, basal ganglia, and cerebellum. These are most consistent with watershed infarcts, commonly associated with cardiac arrhythmia or hypotensive event. Shower of emboli not excluded but less  favored. Atrophy and small vessel disease similar to priors. No large vessel occlusion on MRA. Potentially flow reducing stenosis of the cavernous ICA on the RIGHT, 50-75%. Electronically Signed   By: Staci Righter M.D.   On: 12/27/2015 10:09     TELEMETRY:   I have reviewed telemetry today December 30, 2015. There is normal sinus rhythm with frequent PACs/PVCs. No atrial fibrillation has been seen on the monitor.  ASSESSMENT AND PLAN:  1) Acute encephalopathy - resolving     MRI has shown multiple watershed infarcts. Neurology  questions  the possibility of cardiogenic emboli versus hypotensive episode. Echo normal.  2) Markedly elevated troponin/NSTEMI    --assume cardiac but echo without wall motion abnormalities. Possible neuro related but troponin is quite high. Given AKI and brain MRI findings may have been hypotensive episode with related demand coronary ischemia 3) AKI improving   --suspect ATN from hypotension 4) Probable PAF on ECG 4/14     -in NSR on monitor with PVCs 5) Mild dementia  Difficult situation. I suspect watershed infarcts, AKI and troponin possibly related to hypotensive episode but inciting event unclear. Echo reviewed personally and no obvious WMAs. ECG 4/14 looks like AF but no AF on tele.   I suspect trigger may have been PAF with RVR but hard to know. Would continue heparin for now. As creatinine improving, I think she probably warrants cath with trop 35 despite mild dementia. Will plan cath Tues or Wednesday depending on course and renal function. Continue ASA, statin, heparin. No b-blocker yet as BP too soft and don't want to drop further.    Donzell Coller,MD 12:02 PM

## 2015-12-30 NOTE — Progress Notes (Signed)
ANTICOAGULATION CONSULT NOTE - [place] for Heparin  Indication: chest pain/ACS, elevated troponin  Allergies  Allergen Reactions  . Codeine Nausea Only   Patient Measurements: Height: 5\' 5"  (165.1 cm) Weight: 150 lb 9.2 oz (68.3 kg) IBW/kg (Calculated) : 57  Vital Signs: Temp: 98.3 F (36.8 C) (04/16 0400) Temp Source: Oral (04/16 0400) BP: 118/71 mmHg (04/16 0400) Pulse Rate: 78 (04/16 0400)  Labs:  Recent Labs  12/28/15 1500  12/29/15 0400 12/29/15 1157 12/29/15 1736 12/29/15 1825 12/30/15 0115 12/30/15 0625  HGB 10.4*  --  10.6*  --   --   --  10.2*  --   HCT 32.8*  --  33.7*  --   --   --  32.2*  --   PLT 134*  --  136*  --   --   --  145*  --   HEPARINUNFRC  --   < > 0.23*  --  0.21*  --   --  0.24*  CREATININE 2.18*  --  2.00*  --   --   --  1.68*  --   TROPONINI 26.53*  < > 23.77* 19.70*  --  17.60* 15.29*  --   < > = values in this interval not displayed.  Medical History: Past Medical History  Diagnosis Date  . Type 2 diabetes mellitus ([place])   . Colon cancer ([place])   . Anemia   . Essential hypertension, benign   . Mixed hyperlipidemia   . Macular degeneration   . History of pneumonia   . Iron deficiency anemia     Erosive antral gastritis  . Cognitive impairment     Assessment: 59 yoF with elevated troponin and watershed infarcts on heparin, troponin peaked (27.25) and trending down.   HL still low at 0.24 on 850 units/hr. Will adjust  Hgb 10.2, Plt 145, no bleeding noted.  Goal of Therapy:  Heparin level 0.3-0.5 units/ml Monitor platelets by anticoagulation protocol: Yes   Plan:  -Increase to heparin at 950 units/hr -HL in 8 hr -Daily HL and CBC   Vincenza Hews, PharmD, BCPS 12/30/2015, 6:52 AM Pager: [phone]

## 2015-12-30 NOTE — Progress Notes (Signed)
[place] for Heparin  Indication: chest pain/ACS, elevated troponin  Allergies  Allergen Reactions  . Codeine Nausea Only   Patient Measurements: Height: 5\' 5"  (165.1 cm) Weight: 150 lb 9.2 oz (68.3 kg) IBW/kg (Calculated) : 57  Vital Signs: Temp: 98.8 F (37.1 C) (04/16 1200) Temp Source: Oral (04/16 1200) BP: 121/69 mmHg (04/16 1200) Pulse Rate: 69 (04/16 1200)  Labs:  Recent Labs  12/28/15 1500  12/29/15 0400  12/29/15 1736  12/30/15 0115 12/30/15 0625 12/30/15 0626 12/30/15 1155 12/30/15 1529  HGB 10.4*  --  10.6*  --   --   --  10.2*  --   --   --   --   HCT 32.8*  --  33.7*  --   --   --  32.2*  --   --   --   --   PLT 134*  --  136*  --   --   --  145*  --   --   --   --   HEPARINUNFRC  --   < > 0.23*  --  0.21*  --   --  0.24*  --   --  0.35  CREATININE 2.18*  --  2.00*  --   --   --  1.68*  --   --   --   --   TROPONINI 26.53*  < > 23.77*  < >  --   < > 15.29*  --  12.92* 9.61*  --   < > = values in this interval not displayed.  Medical History: Past Medical History  Diagnosis Date  . Type 2 diabetes mellitus ([place])   . Colon cancer ([place])   . Anemia   . Essential hypertension, benign   . Mixed hyperlipidemia   . Macular degeneration   . History of pneumonia   . Iron deficiency anemia     Erosive antral gastritis  . Cognitive impairment     Assessment: 18 yoF with elevated troponin and watershed infarcts on heparin gtt, troponin peaked (27.25) and is now trending down.   HL therapeutic x1, CBC stable  Goal of Therapy:  Heparin level 0.3-0.5 units/ml Monitor platelets by anticoagulation protocol: Yes   Plan:  -Continue heparin at 950 units/hr -Daily HL and CBC -Monitor duration of heparin, cath likely mid-week  Harvel Quale  12/30/2015 4:29 PM

## 2015-12-30 NOTE — Progress Notes (Signed)
Patient is very upset/confused and wanted to speak with husband. RN contacted husband at [phone] and explained situation, husband is talking to patient trying to comfort and reassure patient she is okay.

## 2015-12-30 NOTE — Progress Notes (Signed)
Patient ID: Laura Parks, female   DOB: [birth year], 82 y.o.   MRN: [id number]    PROGRESS NOTE    Laura Parks  [id number] DOB: [birth year] DOA: 12/26/2015  PCP: Glenda Chroman., MD   Outpatient Specialists:   Brief Narrative:   82 y.o. female with a history of type 2 diabetes mellitus, colon cancer, hypertension, hyperlipidemia, macular degeneration with blindness and an equivocal history of cognitive impairment, brought to the emergency room for evaluation of confusion and slurred speech that was noted several hours prior to this admission. She was unable to provide any history at the time of the admission and while in ED she has vomited multiple times. CT scan of her head showed no acute intracranial abnormality. She has remained lethargic throughout the ED evaluation. Code stroke was subsequently canceled, as acute aphasia was not clear and patient had no clear focal motor deficits.  Assessment & Plan:   Bilateral, infra and supra-tentorial infarcts, c/w cardioembolic infarcts secondary to either atrial fibrillation vs acute MI - Resultant L facial, L arm and leg weakness, improving and reports feeling better  - MRIAcute bilateral infra- and supratentorial small infarcts - Carotid Doppler and ECHO with normal EF [id number], normal systolic function, grade I diastolic CHF - currently on IV Heparin and will continue per cardiology team recommendations  - PT eval done, recommends HH PT, orders placed  - appreciate neurology team following   Elevated tropoinin Acute coronary syndrome vs atrial fibrillation, ? NSTEMI, demand ischemia  - Troponin 0.05 on arrival, up to 7.77 --> 19 --> 32 --> 35 --> 39 --> 27 --> 23 --> 12 - cardiology following, appreciate assistance  - continue with Heparin drip  - may be suitable cardiac cath next week provided renal function stabilizes   Leukocytosis - unclear etiology - ? UTI but UA unremarkable, currently on Rocephin day #2 - CT chest pending  -  if no convincing PNA, plan on d/c ABX as WBC is now WNL   Hypertension, essential  - reasonably stable this AM  Diabetes, type II, on oral antihyperglycemics - on Actos at home - here on SSI, diet advanced to carb modified and pt tolerating well   Acute kidney injury with metabolic acidosis  - from acute illness, pre renal etiology, demand ischemia - renal US with no acute abnormalities noted  - Cr nicely trending down  - BMP in AM  Hyperkalemia - resolved   Thrombocytopenia - reactive, no signs of bleeding but since pt on Heparin drip, need to monitor closely  - CBC in AM  DVT prophylaxis: On Heparin drip  Code Status: Full  Family Communication: Multiple family members over the phone  Disposition Plan: By 4/17 or 4/18   Consultants:   Cardiology  Neurology   [name]  Procedures:   None   Antimicrobials:   Rocephin 4/15 -->  Subjective: More alert this AM.   Objective: Filed Vitals:   12/30/15 0031 12/30/15 0400 12/30/15 0434 12/30/15 0808  BP:  118/71  127/81  Pulse:  78  79  Temp: 98.6 F (37 C) 98.3 F (36.8 C)  98.2 F (36.8 C)  TempSrc: Oral Oral  Oral  Resp:  25  17  Height:      Weight:   68.3 kg (150 lb 9.2 oz)   SpO2:  98%  98%    Intake/Output Summary (Last 24 hours) at 12/30/15 1222 Last data filed at 12/30/15 0808  Gross per 24 hour  Intake 2456.7 ml  Output    575 ml  Net 1881.7 ml   Filed Weights   12/27/15 2035 12/29/15 0800 12/30/15 0434  Weight: 65.1 kg (143 lb 8.3 oz) 95.301 kg (210 lb 1.6 oz) 68.3 kg (150 lb 9.2 oz)    Examination:  General exam: More alert this AM Respiratory system: Diminished breath sounds at bases, dullness to percussion at the left lower lobe area  Cardiovascular system: S1 & S2 heard, RRR. No JVD, murmurs Gastrointestinal system: Abdomen is nondistended, soft and nontender.  Central nervous system: Alert, follows commands   Data Reviewed: I have personally reviewed following labs and imaging  studies  CBC:  Recent Labs Lab 12/26/15 2141 12/27/15 2131 12/28/15 1500 12/29/15 0400 12/30/15 0115  WBC 9.9 13.4* 16.6* 13.8* 10.3  NEUTROABS 4.8 10.8*  --   --   --   HGB 11.8* 10.7* 10.4* 10.6* 10.2*  HCT 35.5* 33.0* 32.8* 33.7* 32.2*  MCV 86.4 85.9 86.1 86.6 86.8  PLT 147* 137* 134* 136* [id number]*   Basic Metabolic Panel:  Recent Labs Lab 12/26/15 2248 12/27/15 2131 12/28/15 1500 12/29/15 0400 12/30/15 0115  NA 140 135 143 145 138  K 5.0 5.1 4.9 5.0 4.5  CL 107 108 116* 115* 112*  CO2 20* 16* 12* 20* 19*  GLUCOSE 231* 226* 123* 109* 150*  BUN 23* 42* 49* 51* 47*  CREATININE 1.39* 2.67* 2.18* 2.00* 1.68*  CALCIUM 8.9 8.4* 8.5* 8.6* 8.4*   Liver Function Tests:  Recent Labs Lab 12/26/15 2141 12/26/15 2248 12/27/15 2131 12/30/15 0115  AST 27 23 98* 103*  ALT 16 14 35 103*  ALKPHOS 84 80 63 55  BILITOT 0.9 0.8 0.8 0.5  PROT 6.9 6.5 6.1* 5.3*  ALBUMIN 4.0 3.6 3.4* 2.8*   Coagulation Profile:  Recent Labs Lab 12/26/15 2141  INR 0.94   Cardiac Enzymes:  Recent Labs Lab 12/29/15 0400 12/29/15 1157 12/29/15 1825 12/30/15 0115 12/30/15 0626  TROPONINI 23.77* 19.70* 17.60* 15.29* 12.92*   CBG:  Recent Labs Lab 12/29/15 0834 12/29/15 1243 12/29/15 1723 12/29/15 2031 12/30/15 0039  GLUCAP 102* 123* 116* 124* 142*   Urine analysis:    Component Value Date/Time   COLORURINE YELLOW 12/29/2015 1946   APPEARANCEUR CLEAR 12/29/2015 1946   LABSPEC 1.034* 12/29/2015 1946   PHURINE 5.5 12/29/2015 1946   GLUCOSEU NEGATIVE 12/29/2015 1946   HGBUR NEGATIVE 12/29/2015 1946   BILIRUBINUR SMALL* 12/29/2015 1946   KETONESUR 15* 12/29/2015 1946   PROTEINUR 30* 12/29/2015 1946   NITRITE NEGATIVE 12/29/2015 1946   LEUKOCYTESUR NEGATIVE 12/29/2015 1946   Recent Results (from the past 240 hour(s))  Urine culture     Status: None   Collection Time: 12/26/15 10:25 PM  Result Value Ref Range Status   Specimen Description URINE, RANDOM  Final   Special  Requests Normal  Final   Culture NO GROWTH 1 DAY  Final   Report Status 12/28/2015 FINAL  Final  MRSA PCR Screening     Status: None   Collection Time: 12/27/15  9:07 PM  Result Value Ref Range Status   MRSA by PCR NEGATIVE NEGATIVE Final    Comment:        The GeneXpert MRSA Assay (FDA approved for NASAL specimens only), is one component of a comprehensive MRSA colonization surveillance program. It is not intended to diagnose MRSA infection nor to guide or monitor treatment for MRSA infections.       Radiology Studies: Ct Abdomen Pelvis W Contrast 12/27/2015   Small  bilateral effusions Sigmoid diverticulosis. Negative for diverticulitis.   US Renal 12/28/2015  No hydronephrosis. Mild renal atrophy.   Scheduled Meds: . antiseptic oral rinse  7 mL Mouth Rinse BID  . aspirin  300 mg Rectal Daily   Or  . aspirin  325 mg Oral Daily  . atorvastatin  10 mg Oral Daily  . cefTRIAXone (ROCEPHIN)  IV  1 g Intravenous Q24H  . cholestyramine light  4 g Oral BID  . citalopram  20 mg Oral Daily  . insulin aspart  0-9 Units Subcutaneous 6 times per day  . mirtazapine  15 mg Oral QHS   Continuous Infusions: . sodium chloride 50 mL/hr at 12/29/15 2046  . heparin 950 Units/hr (12/30/15 0649)     LOS: 3 days   Time spent: 20 minutes    Faye Ramsay, MD [facility] Pager [phone]  If 7PM-7AM, please contact night-coverage [email or web address] Password [facility] 12/30/2015, 12:22 PM

## 2015-12-31 ENCOUNTER — Inpatient Hospital Stay (HOSPITAL_COMMUNITY): Payer: Medicare Other

## 2015-12-31 DIAGNOSIS — E860 Dehydration: Secondary | ICD-10-CM

## 2015-12-31 DIAGNOSIS — R410 Disorientation, unspecified: Secondary | ICD-10-CM | POA: Diagnosis present

## 2015-12-31 DIAGNOSIS — I10 Essential (primary) hypertension: Secondary | ICD-10-CM

## 2015-12-31 DIAGNOSIS — F0150 Vascular dementia without behavioral disturbance: Secondary | ICD-10-CM | POA: Clinically undetermined

## 2015-12-31 DIAGNOSIS — H548 Legal blindness, as defined in USA: Secondary | ICD-10-CM

## 2015-12-31 LAB — HEPARIN LEVEL (UNFRACTIONATED)
HEPARIN UNFRACTIONATED: 0.25 [IU]/mL — AB (ref 0.30–0.70)
Heparin Unfractionated: 0.43 IU/mL (ref 0.30–0.70)
Heparin Unfractionated: 0.23 IU/mL — ABNORMAL LOW (ref 0.30–0.70)

## 2015-12-31 LAB — COMPREHENSIVE METABOLIC PANEL
ALT: 80 U/L — AB (ref 14–54)
AST: 56 U/L — ABNORMAL HIGH (ref 15–41)
Albumin: 2.7 g/dL — ABNORMAL LOW (ref 3.5–5.0)
Alkaline Phosphatase: 55 U/L (ref 38–126)
Anion gap: 7 (ref 5–15)
BUN: 32 mg/dL — ABNORMAL HIGH (ref 6–20)
CHLORIDE: 114 mmol/L — AB (ref 101–111)
CO2: 19 mmol/L — AB (ref 22–32)
CREATININE: 1.54 mg/dL — AB (ref 0.44–1.00)
Calcium: 8.3 mg/dL — ABNORMAL LOW (ref 8.9–10.3)
GFR calc non Af Amer: 30 mL/min — ABNORMAL LOW (ref >60)
GFR, EST AFRICAN AMERICAN: 35 mL/min — AB (ref >60)
Glucose, Bld: 120 mg/dL — ABNORMAL HIGH (ref 65–99)
POTASSIUM: 4.1 mmol/L (ref 3.5–5.1)
SODIUM: 140 mmol/L (ref 135–145)
Total Bilirubin: 0.5 mg/dL (ref 0.3–1.2)
Total Protein: 5 g/dL — ABNORMAL LOW (ref 6.5–8.1)

## 2015-12-31 LAB — CBC
HCT: 28 % — ABNORMAL LOW (ref 36.0–46.0)
Hemoglobin: 9.3 g/dL — ABNORMAL LOW (ref 12.0–15.0)
MCH: 28.7 pg (ref 26.0–34.0)
MCHC: 33.2 g/dL (ref 30.0–36.0)
MCV: 86.4 fL (ref 78.0–100.0)
PLATELETS: 143 10*3/uL — AB (ref 150–400)
RBC: 3.24 MIL/uL — AB (ref 3.87–5.11)
RDW: 14.9 % (ref 11.5–15.5)
WBC: 6.2 10*3/uL (ref 4.0–10.5)

## 2015-12-31 LAB — GLUCOSE, CAPILLARY
GLUCOSE-CAPILLARY: 102 mg/dL — AB (ref 65–99)
GLUCOSE-CAPILLARY: 167 mg/dL — AB (ref 65–99)
Glucose-Capillary: 116 mg/dL — ABNORMAL HIGH (ref 65–99)
Glucose-Capillary: 118 mg/dL — ABNORMAL HIGH (ref 65–99)

## 2015-12-31 LAB — TROPONIN I
TROPONIN I: 7.88 ng/mL — AB (ref <0.031)
Troponin I: 9.47 ng/mL (ref <0.031)
Troponin I: 6.43 ng/mL (ref <0.031)

## 2015-12-31 LAB — URINE CULTURE: Culture: NO GROWTH

## 2015-12-31 MED ORDER — OLANZAPINE 10 MG PO TBDP
10.0000 mg | ORAL_TABLET | Freq: Every day | ORAL | Status: DC | PRN
  Administered 2016-01-01 – 2016-01-05 (×3): 10 mg via ORAL
  Filled 2015-12-31 (×8): qty 1

## 2015-12-31 MED ORDER — OLANZAPINE 5 MG PO TBDP
2.5000 mg | ORAL_TABLET | Freq: Every day | ORAL | Status: DC
  Administered 2015-12-31 – 2016-01-05 (×6): 2.5 mg via ORAL
  Filled 2015-12-31 (×6): qty 0.5

## 2015-12-31 MED ORDER — BISACODYL 5 MG PO TBEC
5.0000 mg | DELAYED_RELEASE_TABLET | Freq: Every day | ORAL | Status: DC
Start: 2015-12-31 — End: 2016-01-06
  Administered 2016-01-01 – 2016-01-05 (×5): 5 mg via ORAL
  Filled 2015-12-31 (×5): qty 1

## 2015-12-31 NOTE — Progress Notes (Signed)
[place] for Heparin  Indication: chest pain/ACS, elevated troponin  Allergies  Allergen Reactions  . Codeine Nausea Only   Patient Measurements: Height: 5\' 5"  (165.1 cm) Weight: 151 lb 7.3 oz (68.7 kg) IBW/kg (Calculated) : 57  Vital Signs: Temp: 98.7 F (37.1 C) (04/17 1150) Temp Source: Oral (04/17 1150) BP: 123/74 mmHg (04/17 1150) Pulse Rate: 73 (04/17 1150)  Labs:  Recent Labs  12/29/15 0400  12/30/15 0115  12/30/15 1529  12/30/15 2320 12/31/15 0615 12/31/15 1151 12/31/15 1449  HGB 10.6*  --  10.2*  --   --   --   --  9.3*  --   --   HCT 33.7*  --  32.2*  --   --   --   --  28.0*  --   --   PLT 136*  --  145*  --   --   --   --  143*  --   --   HEPARINUNFRC 0.23*  < >  --   < > 0.35  --   --  0.25*  --  0.43  CREATININE 2.00*  --  1.68*  --   --   --   --  1.54*  --   --   TROPONINI 23.77*  < > 15.29*  < >  --   < > 9.47* 7.88* 6.43*  --   < > = values in this interval not displayed.  Medical History: Past Medical History  Diagnosis Date  . Type 2 diabetes mellitus ([place])   . Colon cancer ([place])   . Anemia   . Essential hypertension, benign   . Mixed hyperlipidemia   . Macular degeneration   . History of pneumonia   . Iron deficiency anemia     Erosive antral gastritis  . Cognitive impairment     Assessment: 67 yoF with elevated troponin and watershed infarcts on heparin gtt, troponin peaked (27.25) and is now trending down. HL is now therapeutic at 0.43 after rate increase this am. No issues with heparin at present and CBC stable.    Goal of Therapy:  Heparin level 0.3-0.5 units/ml Monitor platelets by anticoagulation protocol: Yes   Plan:  Continue heparin gtt at 1050 units/hr Check 8 hr confirmatory HL Monitor daily HL, CBC, s/s of bleed  Elenor Quinones, PharmD, [facility] Clinical Pharmacist Pager [phone] 12/31/2015 3:36 PM

## 2015-12-31 NOTE — Care Management Important Message (Signed)
Important Message  Patient Details  Name: Laura Parks MRN: [id number] Date of Birth: 02-Jun-1930   Medicare Important Message Given:  Yes    Ivelis Norgard P Juliani Laduke 12/31/2015, 1:03 PM

## 2015-12-31 NOTE — Progress Notes (Signed)
Patient ID: Laura Parks, female   DOB: 04/22/1930, 82 y.o.   MRN: [id number]    PROGRESS NOTE    CLEMENTINA ALAIMO  [id number] DOB: 05-26-1930 DOA: 12/26/2015  PCP: Glenda Chroman., MD   Outpatient Specialists:   Brief Narrative:   82 y.o. female with a history of type 2 diabetes mellitus, colon cancer, hypertension, hyperlipidemia, macular degeneration with blindness and an equivocal history of cognitive impairment, brought to the emergency room for evaluation of confusion and slurred speech that was noted several hours prior to this admission. She was unable to provide any history at the time of the admission and while in ED she has vomited multiple times. CT scan of her head showed no acute intracranial abnormality. She has remained lethargic throughout the ED evaluation. Code stroke was subsequently canceled, as acute aphasia was not clear and patient had no clear focal motor deficits.  Events since admission: 4/16 - confusion overnight, given dose of Haldol which made it worse, ativan given and pt calmed down  4/17 - sleeping   Assessment & Plan:   Bilateral, infra and supra-tentorial infarcts, c/w cardioembolic infarcts secondary to either atrial fibrillation vs acute MI - Resultant L facial, L arm and leg weakness, improving overall  - MRIAcute bilateral infra- and supratentorial small infarcts - Carotid Doppler and ECHO with normal EF [id number], normal systolic function, grade I diastolic CHF - currently on IV Heparin and will continue per cardiology team recommendations  - PT eval done, recommends HH PT, orders placed  - SLP also done, pt passed eval and regular thin diet recommended   Elevated tropoinin Acute coronary syndrome vs atrial fibrillation, ? NSTEMI, demand ischemia  - Troponin 0.05 on arrival, up to 7.77 --> 19 --> 32 --> 35 --> 39 --> 27 --> 23 --> 12 --> 7 - cardiology following, appreciate assistance  - continue with Heparin drip  - ? cardiac cath per cardio    Confusion overnight - unclear etiology - was given ativan and sleeping hard this AM - will monitor and reassess in the afternoon   Leukocytosis - unclear etiology - no signs of PNA on imaging studies and no UTI - d/c Rocephin   Hypertension, essential  - reasonably stable this AM  Diabetes, type II, on oral antihyperglycemics - on Actos at home - here on SSI, diet advanced to carb modified and pt tolerating well   Acute kidney injury with metabolic acidosis  - from acute illness, pre renal etiology, demand ischemia - renal US with no acute abnormalities noted  - Cr nicely trending down  - BMP in AM  Transaminitis - from acute illness - LFT's trending down  Hyperkalemia - resolved   Thrombocytopenia - reactive, no signs of bleeding, improving  - CBC in AM  Anemia of critical illness - no indication of transfusion at this time   DVT prophylaxis: On Heparin drip  Code Status: Full  Family Communication: Multiple family members over the phone  Disposition Plan: By 4/17 or 4/18   Consultants:   Cardiology  Neurology   [name]  Procedures:   None   Antimicrobials:   Rocephin 4/15 --> 4/17  Subjective: Sleeping this AM, can awake but easily doses off.   Objective: Filed Vitals:   12/31/15 0000 12/31/15 0400 12/31/15 0426 12/31/15 0752  BP: 127/76 116/72  123/74  Pulse: 90 73  73  Temp: 96.4 F (35.8 C) 97.9 F (36.6 C)  97.5 F (36.4 C)  TempSrc: Axillary Axillary  Oral  Resp: 33 23  33  Height:      Weight:   68.7 kg (151 lb 7.3 oz)   SpO2: 96% 99%  97%    Intake/Output Summary (Last 24 hours) at 12/31/15 0902 Last data filed at 12/31/15 0528  Gross per 24 hour  Intake 998.06 ml  Output    475 ml  Net 523.06 ml   Filed Weights   12/29/15 0800 12/30/15 0434 12/31/15 0426  Weight: 95.301 kg (210 lb 1.6 oz) 68.3 kg (150 lb 9.2 oz) 68.7 kg (151 lb 7.3 oz)    Examination:  General exam: Easy to awake, hard to stay awake, has received  ativan last night  Respiratory system: Diminished breath sounds at bases, dullness to percussion at lower lobes  Cardiovascular system: S1 & S2 heard, RRR. No JVD, murmurs Gastrointestinal system: Abdomen is nondistended, soft and nontender.  Central nervous system: sleepy as noted above, opens eyes with verbal commands but hard to stay awake   Data Reviewed: I have personally reviewed following labs and imaging studies  CBC:  Recent Labs Lab 12/26/15 2141 12/27/15 2131 12/28/15 1500 12/29/15 0400 12/30/15 0115 12/31/15 0615  WBC 9.9 13.4* 16.6* 13.8* 10.3 6.2  NEUTROABS 4.8 10.8*  --   --   --   --   HGB 11.8* 10.7* 10.4* 10.6* 10.2* 9.3*  HCT 35.5* 33.0* 32.8* 33.7* 32.2* 28.0*  MCV 86.4 85.9 86.1 86.6 86.8 86.4  PLT 147* 137* 134* 136* 145* [id number]*   Basic Metabolic Panel:  Recent Labs Lab 12/27/15 2131 12/28/15 1500 12/29/15 0400 12/30/15 0115 12/31/15 0615  NA 135 143 145 138 140  K 5.1 4.9 5.0 4.5 4.1  CL 108 116* 115* 112* 114*  CO2 16* 12* 20* 19* 19*  GLUCOSE 226* 123* 109* 150* 120*  BUN 42* 49* 51* 47* 32*  CREATININE 2.67* 2.18* 2.00* 1.68* 1.54*  CALCIUM 8.4* 8.5* 8.6* 8.4* 8.3*   Liver Function Tests:  Recent Labs Lab 12/26/15 2141 12/26/15 2248 12/27/15 2131 12/30/15 0115 12/31/15 0615  AST 27 23 98* 103* 56*  ALT 16 14 35 103* 80*  ALKPHOS 84 80 63 55 55  BILITOT 0.9 0.8 0.8 0.5 0.5  PROT 6.9 6.5 6.1* 5.3* 5.0*  ALBUMIN 4.0 3.6 3.4* 2.8* 2.7*   Coagulation Profile:  Recent Labs Lab 12/26/15 2141  INR 0.94   Cardiac Enzymes:  Recent Labs Lab 12/30/15 0626 12/30/15 1155 12/30/15 1752 12/30/15 2320 12/31/15 0615  TROPONINI 12.92* 9.61* 9.71* 9.47* 7.88*   CBG:  Recent Labs Lab 12/30/15 1243 12/30/15 1245 12/30/15 1712 12/30/15 2159 12/31/15 0758  GLUCAP 62* 111* 132* 139* 102*   Urine analysis:    Component Value Date/Time   COLORURINE YELLOW 12/29/2015 1946   APPEARANCEUR CLEAR 12/29/2015 1946   LABSPEC 1.034*  12/29/2015 1946   PHURINE 5.5 12/29/2015 1946   GLUCOSEU NEGATIVE 12/29/2015 1946   HGBUR NEGATIVE 12/29/2015 1946   BILIRUBINUR SMALL* 12/29/2015 1946   KETONESUR 15* 12/29/2015 1946   PROTEINUR 30* 12/29/2015 1946   NITRITE NEGATIVE 12/29/2015 1946   LEUKOCYTESUR NEGATIVE 12/29/2015 1946   Recent Results (from the past 240 hour(s))  Urine culture     Status: None   Collection Time: 12/26/15 10:25 PM  Result Value Ref Range Status   Specimen Description URINE, RANDOM  Final   Special Requests Normal  Final   Culture NO GROWTH 1 DAY  Final   Report Status 12/28/2015 FINAL  Final  MRSA PCR Screening  Status: None   Collection Time: 12/27/15  9:07 PM  Result Value Ref Range Status   MRSA by PCR NEGATIVE NEGATIVE Final    Comment:        The GeneXpert MRSA Assay (FDA approved for NASAL specimens only), is one component of a comprehensive MRSA colonization surveillance program. It is not intended to diagnose MRSA infection nor to guide or monitor treatment for MRSA infections.   Urine culture     Status: None (Preliminary result)   Collection Time: 12/29/15  7:47 PM  Result Value Ref Range Status   Specimen Description URINE, CATHETERIZED  Final   Special Requests NONE  Final   Culture NO GROWTH < 24 HOURS  Final   Report Status PENDING  Incomplete      Radiology Studies: Ct Abdomen Pelvis W Contrast 12/27/2015   Small bilateral effusions Sigmoid diverticulosis. Negative for diverticulitis.   US Renal 12/28/2015  No hydronephrosis. Mild renal atrophy.   Scheduled Meds: . antiseptic oral rinse  7 mL Mouth Rinse BID  . aspirin  300 mg Rectal Daily   Or  . aspirin  325 mg Oral Daily  . atorvastatin  10 mg Oral Daily  . cefTRIAXone (ROCEPHIN)  IV  1 g Intravenous Q24H  . cholestyramine light  4 g Oral BID  . citalopram  20 mg Oral Daily  . insulin aspart  0-9 Units Subcutaneous TID WC  . mirtazapine  15 mg Oral QHS   Continuous Infusions: . sodium chloride  Stopped (12/30/15 1600)  . heparin 950 Units/hr (12/30/15 1917)    LOS: 4 days   Time spent: 20 minutes   Faye Ramsay, MD [facility] Pager [phone]  If 7PM-7AM, please contact night-coverage [email or web address] Password [email or web address] 12/31/2015, 9:02 AM

## 2015-12-31 NOTE — Progress Notes (Signed)
PATIENT ID: [id number] with diabetes mellitus, colon cancer, hypertension, hyperlipidemia and macular degeneration with blindness here with acute confusion noted to have watershed infarcts on MRI consistent with cardioembolic source or hypotension.  Also with NSTEMI with troponin peak of 38.    INTERVAL HISTORY: Ms. Primas developed delirium overnight requiring sedation.  She was evaluated by palliative care this morning.  Her family would like to pursue aggressive care.   SUBJECTIVE:  Denies chest pain or shortness of breath.   PHYSICAL EXAM Filed Vitals:   12/31/15 0400 12/31/15 0426 12/31/15 0752 12/31/15 1150  BP: 116/72  123/74 123/74  Pulse: 73  73 73  Temp: 97.9 F (36.6 C)  97.5 F (36.4 C) 98.7 F (37.1 C)  TempSrc: Axillary  Oral Oral  Resp: 23  33 33  Height:      Weight:  68.7 kg (151 lb 7.3 oz)    SpO2: 99%  97% 97%   General:  Well-appearing.  No acute distress.  Neck: No JVD Lungs:  CTAB.  No crackles, rhonchi or wheezes Heart:  RRR.  No m/r/g.  Normal S1/S2 Abdomen:  Soft, NT, ND.  +BS  Extremities:  WWP.  No edema.   LABS: Lab Results  Component Value Date   TROPONINI 7.88* 12/31/2015   Results for orders placed or performed during the hospital encounter of 12/26/15 (from the past 24 hour(s))  Glucose, capillary     Status: Abnormal   Collection Time: 12/30/15 12:40 PM  Result Value Ref Range   Glucose-Capillary 288 (H) 65 - 99 mg/dL  Glucose, capillary     Status: Abnormal   Collection Time: 12/30/15 12:43 PM  Result Value Ref Range   Glucose-Capillary 62 (L) 65 - 99 mg/dL  Glucose, capillary     Status: Abnormal   Collection Time: 12/30/15 12:45 PM  Result Value Ref Range   Glucose-Capillary 111 (H) 65 - 99 mg/dL   Comment 1 Notify RN    Comment 2 Document in Chart   Heparin level (unfractionated)     Status: None   Collection Time: 12/30/15  3:29 PM  Result Value Ref Range   Heparin Unfractionated 0.35 0.30 - 0.70 IU/mL  Glucose, capillary      Status: Abnormal   Collection Time: 12/30/15  5:12 PM  Result Value Ref Range   Glucose-Capillary 132 (H) 65 - 99 mg/dL  Troponin I     Status: Abnormal   Collection Time: 12/30/15  5:52 PM  Result Value Ref Range   Troponin I 9.71 (HH) <0.031 ng/mL  Glucose, capillary     Status: Abnormal   Collection Time: 12/30/15  9:59 PM  Result Value Ref Range   Glucose-Capillary 139 (H) 65 - 99 mg/dL   Comment 1 Document in Chart   Troponin I     Status: Abnormal   Collection Time: 12/30/15 11:20 PM  Result Value Ref Range   Troponin I 9.47 (HH) <0.031 ng/mL  CBC     Status: Abnormal   Collection Time: 12/31/15  6:15 AM  Result Value Ref Range   WBC 6.2 4.0 - 10.5 K/uL   RBC 3.24 (L) 3.87 - 5.11 MIL/uL   Hemoglobin 9.3 (L) 12.0 - 15.0 g/dL   HCT 28.0 (L) 36.0 - 46.0 %   MCV 86.4 78.0 - 100.0 fL   MCH 28.7 26.0 - 34.0 pg   MCHC 33.2 30.0 - 36.0 g/dL   RDW 14.9 11.5 - 15.5 %   Platelets 143 (L) 150 -  400 K/uL  Comprehensive metabolic panel     Status: Abnormal   Collection Time: 12/31/15  6:15 AM  Result Value Ref Range   Sodium 140 135 - 145 mmol/L   Potassium 4.1 3.5 - 5.1 mmol/L   Chloride 114 (H) 101 - 111 mmol/L   CO2 19 (L) 22 - 32 mmol/L   Glucose, Bld 120 (H) 65 - 99 mg/dL   BUN 32 (H) 6 - 20 mg/dL   Creatinine, Ser 1.54 (H) 0.44 - 1.00 mg/dL   Calcium 8.3 (L) 8.9 - 10.3 mg/dL   Total Protein 5.0 (L) 6.5 - 8.1 g/dL   Albumin 2.7 (L) 3.5 - 5.0 g/dL   AST 56 (H) 15 - 41 U/L   ALT 80 (H) 14 - 54 U/L   Alkaline Phosphatase 55 38 - 126 U/L   Total Bilirubin 0.5 0.3 - 1.2 mg/dL   GFR calc non Af Amer 30 (L) >60 mL/min   GFR calc Af Amer 35 (L) >60 mL/min   Anion gap 7 5 - 15  Heparin level (unfractionated)     Status: Abnormal   Collection Time: 12/31/15  6:15 AM  Result Value Ref Range   Heparin Unfractionated 0.25 (L) 0.30 - 0.70 IU/mL  Troponin I     Status: Abnormal   Collection Time: 12/31/15  6:15 AM  Result Value Ref Range   Troponin I 7.88 (HH) <0.031 ng/mL    Glucose, capillary     Status: Abnormal   Collection Time: 12/31/15  7:58 AM  Result Value Ref Range   Glucose-Capillary 102 (H) 65 - 99 mg/dL    Intake/Output Summary (Last 24 hours) at 12/31/15 1216 Last data filed at 12/31/15 1000  Gross per 24 hour  Intake 1329.87 ml  Output    475 ml  Net 854.87 ml    Telemetry: Sinus rhythm.  PVCs.   ASSESSMENT AND PLAN:  Active Problems:   Essential hypertension, benign   Type 2 diabetes mellitus (H[facility])   Hyperkalemia   CHF (congestive heart failure) (HCC)   Acute encephalopathy   TIA (transient ischemic attack)   AKI (acute kidney injury) ([place])   Dehydration   Legal blindness   Elevated troponin   Palliative care encounter   Acute renal failure ([facility])   Altered mental status   Stroke [facility])   NSTEMI (non-ST elevated myocardial infarction) ([place])   PAF (paroxysmal atrial fibrillation) ([place])   Acute delirium   Vascular dementia   # NSTEMI: Ms. Sether did not have any chest pain and her echo does not reveal any wall motion abnormalities.   However, her troponin was markedly elevated.  There is no obvious ischemia on her EKG but she has frequent PVCs.  Her renal function continues to improve.  We will plan for [facility] tomorrow if her renal function is stable.  Stop cycling troponins.  Continue aspirin, atorvastatin, and heparin.  Will add beta blocker if BP allows and when permitted by neurology.  It is unclear that this was a primary ACS event.  More likely 2/2 neuro or demand ischemia from hypotension.  # Acute stroke: Cardioembolic vs watershed 2/2 hypotension.  Given that the family is requesting aggressive care, consider TEE with loop if no embolic source is identified.  Given her vision loss, she would be a high fall risk, which would mean she is not a candidate for anticoagulation.  Need more info from family on how much she walks and falls.  If she is not a  candidate for anticoagulation, would not pursue TEE or loop recorder.       Lateisha Thurlow C. Oval Linsey, MD, [facility] 12/31/2015 12:16 PM

## 2015-12-31 NOTE — Evaluation (Signed)
Occupational Therapy Evaluation Patient Details Name: Laura Parks MRN: [id number] DOB: 1930-02-13 Today's Date: 12/31/2015    History of Present Illness  82 yo female admitted 12/26/15 with Lt sided weakness, slurred speech, confusion.  Patient with bilateral CVA's, elevated troponin, dehydration, acute kidney injury.  Troponin beginning to drop today, though still elevated.  PMH:  DM, colon CA, HTN, HLD, macular degeneration, legally blind, HOH, decreased cognition   Clinical Impression   PT admitted with Lt side weakness and slurred speech with confusion. Pt currently with functional limitiations due to the deficits listed below (see OT problem list). PTA was living at home with spouse. Pt will benefit from skilled OT to increase their independence and safety with adls and balance to allow discharge SNF. Pt decr awareness to deficits and reason for admission. Pt will require 24/7 (A) upon d/c and (A) for all discharges.      Follow Up Recommendations  SNF    Equipment Recommendations       Recommendations for Other Services       Precautions / Restrictions Precautions Precautions: Fall Precaution Comments: visual deficits Restrictions Weight Bearing Restrictions: No      Mobility Bed Mobility Overal bed mobility: Needs Assistance Bed Mobility: Supine to Sit Rolling: Min guard   Supine to sit: Min guard     General bed mobility comments: pt requires use of bed rail and incr time  Transfers Overall transfer level: Needs assistance Equipment used: Rolling walker (2 wheeled) Transfers: Sit to/from Stand Sit to Stand: Min assist         General transfer comment: cues for hand placement and safety    Balance                                            ADL Overall ADL's : Needs assistance/impaired Eating/Feeding: Set up;Sitting Eating/Feeding Details (indicate cue type and reason): visual deficits Grooming: Wash/dry face;Min Automotive engineer: Minimal assistance;RW;BSC;Ambulation Armed forces technical officer Details (indicate cue type and reason): cues for safety and visual deficits         Functional mobility during ADLs: Minimal assistance General ADL Comments: Pt needed cues for safety and hand placement. Pt incr risk to fall and lack of awareness to visual deficits. Pt unable to read therapist watch , pt unable to see flower in front of patient ( appears to look like bunny) and required incr head tilts .     Vision Vision Assessment?: Vision impaired- to be further tested in functional context Additional Comments: pt tilting head to the R and using L peripheral vision   Perception     Praxis      Pertinent Vitals/Pain Pain Assessment: No/denies pain     Hand Dominance Right   Extremity/Trunk Assessment Upper Extremity Assessment Upper Extremity Assessment: Generalized weakness   Lower Extremity Assessment Lower Extremity Assessment: Generalized weakness   Cervical / Trunk Assessment Cervical / Trunk Assessment: Kyphotic   Communication Communication Communication: HOH   Cognition Arousal/Alertness: Awake/alert Behavior During Therapy: Flat affect Overall Cognitive Status: History of cognitive impairments - at baseline Area of Impairment: Awareness;Safety/judgement     Memory: Decreased short-term memory   Safety/Judgement: Decreased awareness of deficits;Decreased awareness of safety Awareness: Emergent   General Comments: pt oriented to day of week  as monday month april and year 2017. Pt unaware of admission date and length of time at hospital. pt asked to complete problem solving with max cues to length of stay and unable to complete without total (A). pt reports no deficits with vision and when asked about macular degeneration patient states "they said it but its fine"   General Comments       Exercises       Shoulder Instructions      Home Living  Family/patient expects to be discharged to:: Private residence Living Arrangements: Spouse/significant other;Children Available Help at Discharge: Family;Available 24 hours/day Type of Home: House Home Access: Stairs to enter [employer] of Steps: 1 Entrance Stairs-Rails: None Home Layout: One level     Bathroom Shower/Tub: Teacher, early years/pre: Standard     Home Equipment: Environmental consultant - 2 wheels          Prior Functioning/Environment Level of Independence: Independent        Comments: Was out shopping day before admit    OT Diagnosis: Generalized weakness;Cognitive deficits   OT Problem List: Decreased strength;Decreased activity tolerance;Impaired balance (sitting and/or standing);Decreased safety awareness;Decreased knowledge of use of DME or AE;Decreased knowledge of precautions;Cardiopulmonary status limiting activity;Decreased coordination;Decreased cognition;Impaired vision/perception   OT Treatment/Interventions: Self-care/ADL training;Therapeutic exercise;DME and/or AE instruction;Therapeutic activities;Cognitive remediation/compensation;Visual/perceptual remediation/compensation;Patient/family education;Balance training    OT Goals(Current goals can be found in the care plan section) Acute Rehab OT Goals Patient Stated Goal: none stated OT Goal Formulation: Patient unable to participate in goal setting Time For Goal Achievement: 01/14/16 Potential to Achieve Goals: Good  OT Frequency: Min 2X/week   Barriers to D/C: Other (comment) (uncertain)          Co-evaluation PT/OT/SLP Co-Evaluation/Treatment: Yes Reason for Co-Treatment: For patient/therapist safety   OT goals addressed during session: ADL's and self-care;Strengthening/ROM      End of Session Equipment Utilized During Treatment: Gait belt;Rolling walker Nurse Communication: Mobility status;Precautions  Activity Tolerance: Patient tolerated treatment well Patient left: in  chair;with call bell/phone within reach;with chair alarm set   Time: 1028-1050 OT Time Calculation (min): 22 min Charges:  OT General Charges $OT Visit: 1 Procedure OT Evaluation $OT Eval Moderate Complexity: 1 Procedure G-Codes:    Parke Poisson B January 01, 2016, 11:43 AM   Jeri Modena   OTR/L PagerIP:[phone] Office: [phone] .

## 2015-12-31 NOTE — Progress Notes (Signed)
Physical Therapy Treatment Patient Details Name: Laura Parks MRN: [id number] DOB: 1930-09-08 Today's Date: 12/31/2015    History of Present Illness  82 yo female admitted 12/26/15 with Lt sided weakness, slurred speech, confusion.  Patient with bilateral CVA's, elevated troponin, dehydration, acute kidney injury.  Troponin beginning to drop today, though still elevated.  PMH:  DM, colon CA, HTN, HLD, macular degeneration, legally blind, HOH, decreased cognition    PT Comments    Pt improved with therapy today and able to walk around the bed. Pt still somewhat confused but oriented to time, but decreased short term memory. Pt presents with functional deficits due to decreased strength, balance, and functional activity tolerance. Pt will continue to benefit from PT services to increase independence with functional mobility to be able to return home.    Follow Up Recommendations  Home health PT;Supervision/Assistance - 24 hour     Equipment Recommendations  3in1 (PT)    Recommendations for Other Services       Precautions / Restrictions Precautions Precautions: Fall Precaution Comments: visual deficits Restrictions Weight Bearing Restrictions: No    Mobility  Bed Mobility Overal bed mobility: Needs Assistance Bed Mobility: Supine to Sit Rolling: Min guard   Supine to sit: Min guard     General bed mobility comments: pt requires use of bed rail and incr time  Transfers Overall transfer level: Needs assistance Equipment used: Rolling walker (2 wheeled) Transfers: Sit to/from Stand Sit to Stand: Min assist         General transfer comment: cues for hand placement and safety. Min A to stabilize balance once standing as pt has posterior lean/sway with initial standing.   Ambulation/Gait Ambulation/Gait assistance: Min assist Ambulation Distance (Feet): 15 Feet Assistive device: Rolling walker (2 wheeled) Gait Pattern/deviations: Step-through pattern;Decreased stride  length;Drifts right/left;Trunk flexed;Wide base of support Gait velocity: decreased Gait velocity interpretation: Below normal speed for age/gender General Gait Details: Pt walked around the bed to the chair. She takes very small shuffling steps and needed Min A to steer walker and maneuver around obstacles.    Stairs            Wheelchair Mobility    Modified Rankin (Stroke Patients Only)       Balance Overall balance assessment: Needs assistance Sitting-balance support: No upper extremity supported;Feet supported Sitting balance-Leahy Scale: Good Sitting balance - Comments: Able to sit on the EOB with no problem.    Standing balance support: Bilateral upper extremity supported Standing balance-Leahy Scale: Poor Standing balance comment: Reliant on UE support and needed Min A to correct Posterior LOB with initial standing.                     Cognition Arousal/Alertness: Awake/alert Behavior During Therapy: Flat affect Overall Cognitive Status: History of cognitive impairments - at baseline Area of Impairment: Awareness;Safety/judgement     Memory: Decreased short-term memory   Safety/Judgement: Decreased awareness of deficits;Decreased awareness of safety Awareness: Emergent   General Comments: pt oriented to day of week as monday month april and year 2017. Pt unaware of admission date and length of time at hospital. pt asked to complete problem solving with max cues to length of stay and unable to complete without total (A). pt reports no deficits with vision and when asked about macular degeneration patient states "they said it but its fine"    Exercises      General Comments General comments (skin integrity, edema, etc.): Pt cooperative with therapy.  Pertinent Vitals/Pain Pain Assessment: No/denies pain Vitals stable on room air.     Home Living Family/patient expects to be discharged to:: Private residence Living Arrangements:  Spouse/significant other;Children Available Help at Discharge: Family;Available 24 hours/day Type of Home: House Home Access: Stairs to enter Entrance Stairs-Rails: None Home Layout: One level Home Equipment: Environmental consultant - 2 wheels      Prior Function Level of Independence: Independent      Comments: Was out shopping day before admit   PT Goals (current goals can now be found in the care plan section) Acute Rehab PT Goals Patient Stated Goal: to go home PT Goal Formulation: With patient/family Time For Goal Achievement: 01/05/16 Potential to Achieve Goals: Good Progress towards PT goals: Progressing toward goals    Frequency  Min 3X/week    PT Plan Current plan remains appropriate    Co-evaluation PT/OT/SLP Co-Evaluation/Treatment: Yes Reason for Co-Treatment: For patient/therapist safety PT goals addressed during session: Mobility/safety with mobility;Balance;Proper use of DME;Strengthening/ROM OT goals addressed during session: ADL's and self-care;Strengthening/ROM     End of Session Equipment Utilized During Treatment: Gait belt Activity Tolerance: Patient tolerated treatment well Patient left: in chair;with call bell/phone within reach;with chair alarm set     Time: [id number] PT Time Calculation (min) (ACUTE ONLY): 25 min  Charges:  $Therapeutic Activity: 8-22 mins                    G Codes:      Colon Branch, SPT Colon Branch 12/31/2015, 11:50 AM

## 2015-12-31 NOTE — Progress Notes (Signed)
Entered patients room to give scheduled medications PO. Patient appeared to be sleeping in chair. Patient awakened by RN. While verifying patients name, DOB, etc. to RN, patient was unable to make complete words. Speech was dysarthric. Further assessment revealed R arm inattention/weakness. Face was symmetrical and tongue was midline. Patient was assisted back to bed with gait belt and 2 RNs. Further neuro assessment still revealed total expressive aphasia and R arm weakness. Last know well time was 1600. Rapid Response RN, Myers-MD and Neuro on call MD notified. Neuro MD came to see patient. At that time expressive aphasia had 100% resolved and strength was once again equal in all extremities. Patient has been on heparin drip since admission. CT of the head was ordered. Patient family made aware of status change by Myers-MD. Will continue to monitor patient closely.

## 2015-12-31 NOTE — Progress Notes (Signed)
[place] for Heparin  Indication: chest pain/ACS, elevated troponin  Allergies  Allergen Reactions  . Codeine Nausea Only   Patient Measurements: Height: 5\' 5"  (165.1 cm) Weight: 151 lb 7.3 oz (68.7 kg) IBW/kg (Calculated) : 57  Vital Signs: Temp: 97.9 F (36.6 C) (04/17 0400) Temp Source: Axillary (04/17 0400) BP: 116/72 mmHg (04/17 0400) Pulse Rate: 73 (04/17 0400)  Labs:  Recent Labs  12/28/15 1500  12/29/15 0400  12/30/15 0115 12/30/15 0625  12/30/15 1155 12/30/15 1529 12/30/15 1752 12/30/15 2320 12/31/15 0615  HGB 10.4*  --  10.6*  --  10.2*  --   --   --   --   --   --  9.3*  HCT 32.8*  --  33.7*  --  32.2*  --   --   --   --   --   --  28.0*  PLT 134*  --  136*  --  145*  --   --   --   --   --   --  143*  HEPARINUNFRC  --   < > 0.23*  < >  --  0.24*  --   --  0.35  --   --  0.25*  CREATININE 2.18*  --  2.00*  --  1.68*  --   --   --   --   --   --   --   TROPONINI 26.53*  < > 23.77*  < > 15.29*  --   < > 9.61*  --  9.71* 9.47*  --   < > = values in this interval not displayed.  Medical History: Past Medical History  Diagnosis Date  . Type 2 diabetes mellitus ([place])   . Colon cancer ([place])   . Anemia   . Essential hypertension, benign   . Mixed hyperlipidemia   . Macular degeneration   . History of pneumonia   . Iron deficiency anemia     Erosive antral gastritis  . Cognitive impairment     Assessment: 27 yoF with elevated troponin and watershed infarcts on heparin gtt, troponin peaked (27.25) and is now trending down. HL 0.25 on 950 units/hr. No issues with heparin at present and CBC stable.    Goal of Therapy:  Heparin level 0.3-0.5 units/ml Monitor platelets by anticoagulation protocol: Yes   Plan:  -Increase heparin at 1050 units/hr -HL in 8 hours -Daily HL and CBC -Monitor duration of heparin, cath likely mid-week  Duayne Cal  12/31/2015 6:49 AM

## 2015-12-31 NOTE — Progress Notes (Signed)
Daily Progress Note   Patient Name: Laura Parks       Date: 12/31/2015 DOB: 04/12/1930  Age: 82 y.o. MRN#: [id number] Attending Physician: Theodis Blaze, MD Primary Care Physician: Glenda Chroman., MD Admit Date: 12/26/2015  Reason for Consultation/Follow-up: Non pain symptom management  Subjective: Had a rough night with acute delirium and agitation. Received 1mg  of ativan and is still quite lethargic this afternoon. Palliative saw for goals of care 4/15 and family wishes to pursue aggressive treatments and interventions. Noted delirium overnight and will make recommendations for symptom management today.  Length of Stay: 4 days  Current Medications: Scheduled Meds:  . antiseptic oral rinse  7 mL Mouth Rinse BID  . aspirin  300 mg Rectal Daily   Or  . aspirin  325 mg Oral Daily  . atorvastatin  10 mg Oral Daily  . cholestyramine light  4 g Oral BID  . citalopram  20 mg Oral Daily  . insulin aspart  0-9 Units Subcutaneous TID WC  . mirtazapine  15 mg Oral QHS    Continuous Infusions: . heparin 1,050 Units/hr (12/31/15 0800)    PRN Meds: acetaminophen **OR** acetaminophen  Physical Exam: Physical Exam              Vital Signs: BP 123/74 mmHg  Pulse 73  Temp(Src) 98.7 F (37.1 C) (Oral)  Resp 33  Ht 5\' 5"  (1.651 m)  Wt 68.7 kg (151 lb 7.3 oz)  BMI 25.20 kg/m2  SpO2 97% SpO2: SpO2: 97 % O2 Device: O2 Device: Not Delivered O2 Flow Rate: O2 Flow Rate (L/min): 2 L/min  Intake/output summary:  Intake/Output Summary (Last 24 hours) at 12/31/15 1201 Last data filed at 12/31/15 1000  Gross per 24 hour  Intake 1329.87 ml  Output    475 ml  Net 854.87 ml   LBM: Last BM Date: 12/30/15 Baseline Weight: Weight: 58.968 kg (130 lb) Most recent weight: Weight: 68.7 kg (151  lb 7.3 oz)       Palliative Assessment/Data: Flowsheet Rows        Most Recent Value   Intake Tab    Referral Department  Hospitalist   Unit at Time of Referral  Intermediate Care Unit   Palliative Care Primary Diagnosis  Cardiac   Date Notified  12/27/15  Palliative Care Type  New Palliative care   Reason for referral  Clarify Goals of Care   Date of Admission  12/26/15   Date first seen by Palliative Care  12/28/15   # of days Palliative referral response time  1 Day(s)   # of days IP prior to Palliative referral  1   Clinical Assessment    Palliative Performance Scale Score  20%   Pain Max last 24 hours  Not able to report   Pain Min Last 24 hours  Not able to report   Dyspnea Max Last 24 Hours  Not able to report   Dyspnea Min Last 24 hours  Not able to report   Nausea Max Last 24 Hours  Not able to report   Nausea Min Last 24 Hours  Not able to report   Anxiety Max Last 24 Hours  Not able to report   Anxiety Min Last 24 Hours  Not able to report   Other Max Last 24 Hours  Not able to report   Psychosocial & Spiritual Assessment    Palliative Care Outcomes       Additional Data Reviewed: CBC    Component Value Date/Time   WBC 6.2 12/31/2015 0615   RBC 3.24* 12/31/2015 0615   HGB 9.3* 12/31/2015 0615   HCT 28.0* 12/31/2015 0615   PLT 143* 12/31/2015 0615   MCV 86.4 12/31/2015 0615   MCH 28.7 12/31/2015 0615   MCHC 33.2 12/31/2015 0615   RDW 14.9 12/31/2015 0615   LYMPHSABS 1.5 12/27/2015 2131   MONOABS 1.1* 12/27/2015 2131   EOSABS 0.0 12/27/2015 2131   BASOSABS 0.0 12/27/2015 2131    CMP     Component Value Date/Time   NA 140 12/31/2015 0615   K 4.1 12/31/2015 0615   CL 114* 12/31/2015 0615   CO2 19* 12/31/2015 0615   GLUCOSE 120* 12/31/2015 0615   BUN 32* 12/31/2015 0615   CREATININE 1.54* 12/31/2015 0615   CALCIUM 8.3* 12/31/2015 0615   PROT 5.0* 12/31/2015 0615   ALBUMIN 2.7* 12/31/2015 0615   AST 56* 12/31/2015 0615   ALT 80* 12/31/2015 0615     ALKPHOS 55 12/31/2015 0615   BILITOT 0.5 12/31/2015 0615   GFRNONAA 30* 12/31/2015 0615   GFRAA 35* 12/31/2015 0615       Problem List:  Patient Active Problem List   Diagnosis Date Noted  . PAF (paroxysmal atrial fibrillation) ([place])   . Elevated troponin 12/28/2015  . Palliative care encounter   . Acute renal failure ([place])   . Altered mental status   . Stroke ([place])   . NSTEMI (non-ST elevated myocardial infarction) ([place])   . Hyperkalemia 12/27/2015  . CHF (congestive heart failure) ([place]) 12/27/2015  . Acute encephalopathy 12/27/2015  . TIA (transient ischemic attack) 12/27/2015  . AKI (acute kidney injury) ([place]) 12/27/2015  . Dehydration 12/27/2015  . Legal blindness 12/27/2015  . B12 deficiency 09/02/2014  . Gait abnormality 07/26/2014  . Delusions ([place]) 07/26/2014  . Depression 07/26/2014  . Cognitive impairment   . Type 2 diabetes mellitus ([place]) 02/02/2014  . Carotid artery occlusion 02/02/2014  . PVC's (premature ventricular contractions) 02/02/2014  . Mixed hyperlipidemia 02/23/2012  . Essential hypertension, benign 02/23/2012     Palliative Care Assessment & Plan   [name] has acute delirium-multifactorial probably related to vascular dementia (perhaps occult), ICU care, poor sleep and different environment- should also consider evaluation for organic causes- constipation, UTI or other infection source as well.  Check for UTI or symptoms  Bowel Regimen- no stool documented since admission  Ativan 1mg  likely too strong for her and she has residual sedation- avoid benzodiazepines   In addition to Ativan she also had 2mg  of haldol-residual sedation today  I would be proactive for the sundowning and start low dose Zyprexa 2.5mg  PO at around 8PM  Provide standard nursing delirium precautions- keep environment quiet provide gentle reassurance-sleep wake cycles monitoring etc..  Will follow for delirium management for now.    Thank you for allowing  the Palliative Medicine Team to assist in the care of this patient.   Time In: 11:30 Time Out: 1155 Total Time 72min Prolonged Time Billed no        Acquanetta Chain, DO  12/31/2015, 12:01 PM  Please contact Palliative Medicine Team phone at [phone] for questions and concerns.

## 2016-01-01 DIAGNOSIS — E875 Hyperkalemia: Secondary | ICD-10-CM

## 2016-01-01 LAB — URINALYSIS, ROUTINE W REFLEX MICROSCOPIC
Bilirubin Urine: NEGATIVE
GLUCOSE, UA: NEGATIVE mg/dL
HGB URINE DIPSTICK: NEGATIVE
Ketones, ur: NEGATIVE mg/dL
Leukocytes, UA: NEGATIVE
Nitrite: NEGATIVE
PROTEIN: NEGATIVE mg/dL
SPECIFIC GRAVITY, URINE: 1.008 (ref 1.005–1.030)
pH: 5 (ref 5.0–8.0)

## 2016-01-01 LAB — COMPREHENSIVE METABOLIC PANEL
ALBUMIN: 2.8 g/dL — AB (ref 3.5–5.0)
ALK PHOS: 57 U/L (ref 38–126)
ALT: 66 U/L — AB (ref 14–54)
AST: 37 U/L (ref 15–41)
Anion gap: 10 (ref 5–15)
BILIRUBIN TOTAL: 0.6 mg/dL (ref 0.3–1.2)
BUN: 27 mg/dL — AB (ref 6–20)
CO2: 19 mmol/L — ABNORMAL LOW (ref 22–32)
Calcium: 8.5 mg/dL — ABNORMAL LOW (ref 8.9–10.3)
Chloride: 111 mmol/L (ref 101–111)
Creatinine, Ser: 1.52 mg/dL — ABNORMAL HIGH (ref 0.44–1.00)
GFR calc Af Amer: 35 mL/min — ABNORMAL LOW (ref >60)
GFR, EST NON AFRICAN AMERICAN: 31 mL/min — AB (ref >60)
Glucose, Bld: 114 mg/dL — ABNORMAL HIGH (ref 65–99)
POTASSIUM: 4.3 mmol/L (ref 3.5–5.1)
SODIUM: 140 mmol/L (ref 135–145)
TOTAL PROTEIN: 5.2 g/dL — AB (ref 6.5–8.1)

## 2016-01-01 LAB — HEPARIN LEVEL (UNFRACTIONATED)
HEPARIN UNFRACTIONATED: 0.28 [IU]/mL — AB (ref 0.30–0.70)
Heparin Unfractionated: 0.47 IU/mL (ref 0.30–0.70)

## 2016-01-01 LAB — CBC
HEMATOCRIT: 28.8 % — AB (ref 36.0–46.0)
HEMOGLOBIN: 9.3 g/dL — AB (ref 12.0–15.0)
MCH: 27.6 pg (ref 26.0–34.0)
MCHC: 32.3 g/dL (ref 30.0–36.0)
MCV: 85.5 fL (ref 78.0–100.0)
Platelets: 160 10*3/uL (ref 150–400)
RBC: 3.37 MIL/uL — AB (ref 3.87–5.11)
RDW: 14.9 % (ref 11.5–15.5)
WBC: 7.3 10*3/uL (ref 4.0–10.5)

## 2016-01-01 LAB — GLUCOSE, CAPILLARY
GLUCOSE-CAPILLARY: 94 mg/dL (ref 65–99)
GLUCOSE-CAPILLARY: 183 mg/dL — AB (ref 65–99)
Glucose-Capillary: 96 mg/dL (ref 65–99)
Glucose-Capillary: 177 mg/dL — ABNORMAL HIGH (ref 65–99)

## 2016-01-01 MED ORDER — ACETAMINOPHEN 325 MG PO TABS
650.0000 mg | ORAL_TABLET | Freq: Four times a day (QID) | ORAL | Status: DC
  Administered 2016-01-01 – 2016-01-06 (×13): 650 mg via ORAL
  Filled 2016-01-01 (×13): qty 2

## 2016-01-01 MED ORDER — GERHARDT'S BUTT CREAM
TOPICAL_CREAM | Freq: Three times a day (TID) | CUTANEOUS | Status: DC
  Administered 2016-01-01 (×2): via TOPICAL
  Administered 2016-01-02: 1 via TOPICAL
  Administered 2016-01-02 – 2016-01-05 (×9): via TOPICAL
  Filled 2016-01-01 (×2): qty 1

## 2016-01-01 MED ORDER — OLANZAPINE 10 MG PO TBDP
10.0000 mg | ORAL_TABLET | Freq: Once | ORAL | Status: AC
  Administered 2016-01-01: 10 mg via ORAL
  Filled 2016-01-01: qty 1

## 2016-01-01 MED ORDER — LORAZEPAM 2 MG/ML IJ SOLN
1.0000 mg | Freq: Once | INTRAMUSCULAR | Status: AC
  Administered 2016-01-02: 1 mg via INTRAVENOUS
  Filled 2016-01-01: qty 1

## 2016-01-01 NOTE — Progress Notes (Signed)
ANTICOAGULATION CONSULT NOTE - Follow Up Consult  Pharmacy Consult for Heparin Indication: chest pain/ACS and elevated troponin  Allergies  Allergen Reactions  . Codeine Nausea Only    Patient Measurements: Height: 5\' 5"  (165.1 cm) Weight: 150 lb 9.2 oz (68.3 kg) IBW/kg (Calculated) : 57 Heparin Dosing Weight:   Vital Signs: Temp: 97.2 F (36.2 C) (04/18 1608) Temp Source: Oral (04/18 1608) BP: 151/84 mmHg (04/18 1608) Pulse Rate: 60 (04/18 1157)  Labs:  Recent Labs  12/30/15 0115  12/30/15 2320 12/31/15 0615 12/31/15 1151  12/31/15 2230 01/01/16 0301 01/01/16 0831 01/01/16 1819  HGB 10.2*  --   --  9.3*  --   --   --  9.3*  --   --   HCT 32.2*  --   --  28.0*  --   --   --  28.8*  --   --   PLT 145*  --   --  143*  --   --   --  160  --   --   HEPARINUNFRC  --   < >  --  0.25*  --   < > 0.23*  --  0.28* 0.47  CREATININE 1.68*  --   --  1.54*  --   --   --  1.52*  --   --   TROPONINI 15.29*  < > 9.47* 7.88* 6.43*  --   --   --   --   --   < > = values in this interval not displayed.  Estimated Creatinine Clearance: 25.2 mL/min (by C-G formula based on Cr of 1.52).   Medications:  Scheduled:  . acetaminophen  650 mg Oral QID  . antiseptic oral rinse  7 mL Mouth Rinse BID  . aspirin  300 mg Rectal Daily   Or  . aspirin  325 mg Oral Daily  . atorvastatin  10 mg Oral Daily  . bisacodyl  5 mg Oral Daily  . cholestyramine light  4 g Oral BID  . citalopram  20 mg Oral Daily  . Gerhardt's butt cream   Topical TID  . insulin aspart  0-9 Units Subcutaneous TID WC  . mirtazapine  15 mg Oral QHS  . OLANZapine zydis  2.5 mg Oral QHS    Assessment: 82yo female with NSTEMI and acute stroke, for cath 4/19.   Heparin level within range at 0.47    Goal of Therapy:  Heparin level 0.3-0.5 units/ml Monitor platelets by anticoagulation protocol: Yes   Plan:  Continue heparin at 1250 units/hr Watch for s/s of bleeding   Thank you Anette Guarneri,  PharmD [phone]

## 2016-01-01 NOTE — Progress Notes (Signed)
ANTICOAGULATION CONSULT NOTE - Follow Up Consult  Pharmacy Consult for Heparin Indication: chest pain/ACS and elevated troponin  Allergies  Allergen Reactions  . Codeine Nausea Only    Patient Measurements: Height: 5\' 5"  (165.1 cm) Weight: 150 lb 9.2 oz (68.3 kg) IBW/kg (Calculated) : 57 Heparin Dosing Weight:   Vital Signs: Temp: 97.8 F (36.6 C) (04/18 0400) Temp Source: Oral (04/18 0400) BP: 118/80 mmHg (04/18 0400) Pulse Rate: 79 (04/18 0400)  Labs:  Recent Labs  12/30/15 0115  12/30/15 2320 12/31/15 0615 12/31/15 1151 12/31/15 1449 12/31/15 2230 01/01/16 0301 01/01/16 0831  HGB 10.2*  --   --  9.3*  --   --   --  9.3*  --   HCT 32.2*  --   --  28.0*  --   --   --  28.8*  --   PLT 145*  --   --  143*  --   --   --  160  --   HEPARINUNFRC  --   < >  --  0.25*  --  0.43 0.23*  --  0.28*  CREATININE 1.68*  --   --  1.54*  --   --   --  1.52*  --   TROPONINI 15.29*  < > 9.47* 7.88* 6.43*  --   --   --   --   < > = values in this interval not displayed.  Estimated Creatinine Clearance: 25.2 mL/min (by C-G formula based on Cr of 1.52).   Medications:  Scheduled:  . antiseptic oral rinse  7 mL Mouth Rinse BID  . aspirin  300 mg Rectal Daily   Or  . aspirin  325 mg Oral Daily  . atorvastatin  10 mg Oral Daily  . bisacodyl  5 mg Oral Daily  . cholestyramine light  4 g Oral BID  . citalopram  20 mg Oral Daily  . insulin aspart  0-9 Units Subcutaneous TID WC  . mirtazapine  15 mg Oral QHS  . OLANZapine zydis  2.5 mg Oral QHS    Assessment: 82yo female with NSTEMI and acute stroke, for cath 4/19.  Heparin level remains below goal on 1150 units/hr.  Hg stable & pltc wnl.  No bleeding noted.  Goal range adjusted for lower end due to acute stroke as well.    Goal of Therapy:  Heparin level 0.3-0.5 units/ml Monitor platelets by anticoagulation protocol: Yes   Plan:  Increase Heparin to 1250 units/hr Watch for s/s of bleeding Repeat heparin level  8hr  Gracy Bruins, [facility]

## 2016-01-01 NOTE — Progress Notes (Addendum)
Patient ID: Laura Parks, female   DOB: 10/03/1929, 82 y.o.   MRN: [id number]    PROGRESS NOTE    Laura Parks  [id number] DOB: 12-27-1929 DOA: 12/26/2015  PCP: Glenda Chroman., MD   Outpatient Specialists: Dr. Laural Golden - GI, Dr. Stanford Breed - ortho   Brief Narrative:   82 y.o. female with a history of type 2 diabetes mellitus, colon cancer, hypertension, hyperlipidemia, macular degeneration with blindness and an equivocal history of cognitive impairment, brought to the emergency room for evaluation of confusion and slurred speech that was noted several hours prior to this admission. She was unable to provide any history at the time of the admission and while in ED she has vomited multiple times. CT scan of her head showed no acute intracranial abnormality. She has remained lethargic throughout the ED evaluation. Code stroke was subsequently canceled, as acute aphasia was not clear and patient had no clear focal motor deficits.  Events since admission: 4/16 - confusion overnight, given dose of Haldol which made it worse, ativan given and pt calmed down  4/17 - sleeping, at 6 pm with slurred speech and right sided weakness, confusion, cleared up within one hour 4/18 - more alert and sitting in chair, follows commands   Assessment & Plan:   Bilateral, infra and supra-tentorial infarcts, c/w cardioembolic infarcts secondary to either atrial fibrillation vs acute MI - Resultant L facial, L arm and leg weakness, improving overall  - MRIAcute bilateral infra- and supratentorial small infarcts - Carotid Doppler and ECHO with normal EF [id number], normal systolic function, grade I diastolic CHF - currently on IV Heparin and will continue per cardiology team recommendations  - PT eval done, recommends HH PT, OT eval done, recommends SNF - I have discussed this with daughter and she will talk with rest of the family to make the decision  - SLP also done, pt passed eval and regular thin diet recommended   - spoke with Dr. Leonie Man, stroke specialist, no further recommendations from their standpoint  - issue of AC to be addressed by cardiology, agree she is at high risk for falls especially given her poor vision   Elevated tropoinin Acute coronary syndrome vs atrial fibrillation, ? NSTEMI, demand ischemia  - Troponin 0.05 on arrival, up to 7.77 --> 19 --> 32 --> 35 --> 39 --> 27 --> 23 --> 12 --> 6 - cardiology following, appreciate assistance  - continue with Heparin drip  - ? cardiac cath per cardio, tentatively placed on scheduled for tomorrow   Confusion overnight 4/17 - unclear etiology - zyprexa seems to be helping - appreciate [name] following  - pt more alert with clearer mental status, sitting in chair and following commands   Leukocytosis - no signs of PNA on imaging studies and no UTI - discontinued Rocephin 4/17, pt has received total of three days of rocephin   Hypertension, essential  - reasonably stable this AM  Diabetes, type II, on oral antihyperglycemics - on Actos at home - here on SSI, diet advanced to carb modified and pt tolerating well   Acute kidney injury with metabolic acidosis  - from acute illness, pre renal etiology, demand ischemia - renal US with no acute abnormalities noted  - Cr nicely trending down and remains stable ~ 1.5 - BMP in AM  Transaminitis - from acute illness - LFT's trending down  Hyperkalemia - resolved   Thrombocytopenia - reactive, no signs of bleeding, improving  - CBC in AM  Anemia  of critical illness - no indication of transfusion at this time   DVT prophylaxis: On Heparin drip  Code Status: Full  Family Communication: Multiple family members over the phone  Disposition Plan: when cleared by cardiology team   Consultants:   Cardiology  Neurology   [name]  SW  PT/OT/SLP  Procedures:   None   Antimicrobials:   Rocephin 4/15 --> 4/17  Subjective: Alert, sitting in chair, says she feels better.    Objective: Filed Vitals:   12/31/15 2024 01/01/16 0007 01/01/16 0400 01/01/16 0434  BP: 105/62 131/77 118/80   Pulse: 29 83 79   Temp: 97.6 F (36.4 C) 99 F (37.2 C) 97.8 F (36.6 C)   TempSrc: Oral Oral Oral   Resp: 10 7 22    Height:      Weight:    68.3 kg (150 lb 9.2 oz)  SpO2: 97% 99% 99%     Intake/Output Summary (Last 24 hours) at 01/01/16 0844 Last data filed at 01/01/16 0545  Gross per 24 hour  Intake 826.19 ml  Output   1550 ml  Net -723.81 ml   Filed Weights   12/30/15 0434 12/31/15 0426 01/01/16 0434  Weight: 68.3 kg (150 lb 9.2 oz) 68.7 kg (151 lb 7.3 oz) 68.3 kg (150 lb 9.2 oz)    Examination:  General exam: alert and sitting in chair  Respiratory system: Diminished breath sounds at bases, dullness to percussion at lower lobes  Cardiovascular system: S1 & S2 heard, RRR. No JVD, murmurs Gastrointestinal system: Abdomen is nondistended, soft and nontender.  Central nervous system: alert, follows commands, moving all 4 extremities against gravity   Data Reviewed: I have personally reviewed following labs and imaging studies  CBC:  Recent Labs Lab 12/26/15 2141 12/27/15 2131 12/28/15 1500 12/29/15 0400 12/30/15 0115 12/31/15 0615 01/01/16 0301  WBC 9.9 13.4* 16.6* 13.8* 10.3 6.2 7.3  NEUTROABS 4.8 10.8*  --   --   --   --   --   HGB 11.8* 10.7* 10.4* 10.6* 10.2* 9.3* 9.3*  HCT 35.5* 33.0* 32.8* 33.7* 32.2* 28.0* 28.8*  MCV 86.4 85.9 86.1 86.6 86.8 86.4 85.5  PLT 147* 137* 134* 136* 145* 143* 0000000   Basic Metabolic Panel:  Recent Labs Lab 12/28/15 1500 12/29/15 0400 12/30/15 0115 12/31/15 0615 01/01/16 0301  NA 143 145 138 140 140  K 4.9 5.0 4.5 4.1 4.3  CL 116* 115* 112* 114* 111  CO2 12* 20* 19* 19* 19*  GLUCOSE 123* 109* 150* 120* 114*  BUN 49* 51* 47* 32* 27*  CREATININE 2.18* 2.00* 1.68* 1.54* 1.52*  CALCIUM 8.5* 8.6* 8.4* 8.3* 8.5*   Liver Function Tests:  Recent Labs Lab 12/26/15 2248 12/27/15 2131 12/30/15 0115  12/31/15 0615 01/01/16 0301  AST 23 98* 103* 56* 37  ALT 14 35 103* 80* 66*  ALKPHOS 80 63 55 55 57  BILITOT 0.8 0.8 0.5 0.5 0.6  PROT 6.5 6.1* 5.3* 5.0* 5.2*  ALBUMIN 3.6 3.4* 2.8* 2.7* 2.8*   Coagulation Profile:  Recent Labs Lab 12/26/15 2141  INR 0.94   Cardiac Enzymes:  Recent Labs Lab 12/30/15 1155 12/30/15 1752 12/30/15 2320 12/31/15 0615 12/31/15 1151  TROPONINI 9.61* 9.71* 9.47* 7.88* 6.43*   CBG:  Recent Labs Lab 12/31/15 0758 12/31/15 1242 12/31/15 1650 12/31/15 2147 01/01/16 0741  GLUCAP 102* 116* 167* 118* 96   Urine analysis:    Component Value Date/Time   COLORURINE YELLOW 12/29/2015 1946   APPEARANCEUR CLEAR 12/29/2015 1946  LABSPEC 1.034* 12/29/2015 1946   PHURINE 5.5 12/29/2015 1946   GLUCOSEU NEGATIVE 12/29/2015 1946   HGBUR NEGATIVE 12/29/2015 1946   BILIRUBINUR SMALL* 12/29/2015 1946   KETONESUR 15* 12/29/2015 1946   PROTEINUR 30* 12/29/2015 1946   NITRITE NEGATIVE 12/29/2015 1946   LEUKOCYTESUR NEGATIVE 12/29/2015 1946   Recent Results (from the past 240 hour(s))  Urine culture     Status: None   Collection Time: 12/26/15 10:25 PM  Result Value Ref Range Status   Specimen Description URINE, RANDOM  Final   Special Requests Normal  Final   Culture NO GROWTH 1 DAY  Final   Report Status 12/28/2015 FINAL  Final  MRSA PCR Screening     Status: None   Collection Time: 12/27/15  9:07 PM  Result Value Ref Range Status   MRSA by PCR NEGATIVE NEGATIVE Final    Comment:        The GeneXpert MRSA Assay (FDA approved for NASAL specimens only), is one component of a comprehensive MRSA colonization surveillance program. It is not intended to diagnose MRSA infection nor to guide or monitor treatment for MRSA infections.   Urine culture     Status: None   Collection Time: 12/29/15  7:47 PM  Result Value Ref Range Status   Specimen Description URINE, CATHETERIZED  Final   Special Requests NONE  Final   Culture NO GROWTH 2 DAYS   Final   Report Status 12/31/2015 FINAL  Final      Radiology Studies: Ct Abdomen Pelvis W Contrast 12/27/2015   Small bilateral effusions Sigmoid diverticulosis. Negative for diverticulitis.   US Renal 12/28/2015  No hydronephrosis. Mild renal atrophy.   Scheduled Meds: . antiseptic oral rinse  7 mL Mouth Rinse BID  . aspirin  300 mg Rectal Daily   Or  . aspirin  325 mg Oral Daily  . atorvastatin  10 mg Oral Daily  . bisacodyl  5 mg Oral Daily  . cholestyramine light  4 g Oral BID  . citalopram  20 mg Oral Daily  . insulin aspart  0-9 Units Subcutaneous TID WC  . mirtazapine  15 mg Oral QHS  . OLANZapine zydis  2.5 mg Oral QHS   Continuous Infusions: . heparin 1,150 Units/hr (01/01/16 0007)    LOS: 5 days   Time spent: 20 minutes   Faye Ramsay, MD [facility] Pager [phone]  If 7PM-7AM, please contact night-coverage [email or web address] Password [facility] 01/01/2016, 8:44 AM

## 2016-01-01 NOTE — Progress Notes (Signed)
Subjective: No complaints  Objective: Vital signs in last 24 hours: Temp:  [97.3 F (36.3 C)-99 F (37.2 C)] 97.8 F (36.6 C) (04/18 0400) Pulse Rate:  [29-83] 79 (04/18 0400) Resp:  [7-33] 22 (04/18 0400) BP: (105-145)/(62-80) 118/80 mmHg (04/18 0400) SpO2:  [97 %-100 %] 99 % (04/18 0400) Weight:  [150 lb 9.2 oz (68.3 kg)] 150 lb 9.2 oz (68.3 kg) (04/18 0434) Last BM Date: 12/31/15  Intake/Output from previous day: 04/17 0701 - 04/18 0700 In: 826.2 [P.O.:480; I.V.:346.2] Out: 1725 [Urine:1725] Intake/Output this shift:    Medications Scheduled Meds: . antiseptic oral rinse  7 mL Mouth Rinse BID  . aspirin  300 mg Rectal Daily   Or  . aspirin  325 mg Oral Daily  . atorvastatin  10 mg Oral Daily  . bisacodyl  5 mg Oral Daily  . cholestyramine light  4 g Oral BID  . citalopram  20 mg Oral Daily  . insulin aspart  0-9 Units Subcutaneous TID WC  . mirtazapine  15 mg Oral QHS  . OLANZapine zydis  2.5 mg Oral QHS   Continuous Infusions: . heparin 1,150 Units/hr (01/01/16 0007)   PRN Meds:.acetaminophen **OR** acetaminophen, OLANZapine zydis  PE: General appearance: alert, cooperative and no distress Lungs: clear to auscultation bilaterally Heart: irregularly irregular rhythm and No MM Extremities: No LEE Pulses: 2+ and symmetric Skin: Warm and dry Neurologic: Grossly normal, alert and oriented.  Lab Results:   Recent Labs  12/30/15 0115 12/31/15 0615 01/01/16 0301  WBC 10.3 6.2 7.3  HGB 10.2* 9.3* 9.3*  HCT 32.2* 28.0* 28.8*  PLT 145* 143* 160   BMET  Recent Labs  12/30/15 0115 12/31/15 0615 01/01/16 0301  NA 138 140 140  K 4.5 4.1 4.3  CL 112* 114* 111  CO2 19* 19* 19*  GLUCOSE 150* 120* 114*  BUN 47* 32* 27*  CREATININE 1.68* 1.54* 1.52*  CALCIUM 8.4* 8.3* 8.5*     Assessment/Plan  75F with diabetes mellitus, colon cancer, hypertension, hyperlipidemia and macular degeneration with blindness here with acute confusion noted to have  watershed infarcts on MRI consistent with cardioembolic source or hypotension. Also with NSTEMI with troponin peak of 38.   Active Problems:   Essential hypertension, benign   Type 2 diabetes mellitus (HCC)   Hyperkalemia   CHF (congestive heart failure) (HCC)   Acute encephalopathy   TIA (transient ischemic attack)   AKI (acute kidney injury) ([place])   Dehydration   Legal blindness   Elevated troponin   Palliative care encounter   Acute renal failure (HCC)   Altered mental status   Stroke [facility])   NSTEMI (non-ST elevated myocardial infarction) ([place])   PAF (paroxysmal atrial fibrillation) ([place])   Acute delirium   Vascular dementia  # NSTEMI: Ms. Mollison did not have any chest pain and her echo does not reveal any wall motion abnormalities. However, her troponin was markedly elevated. There is no obvious ischemia on her EKG but she has frequent PVCs. Her renal function continues to improve.   Her renal function is stable with a SCr of 1.52.  We will add to the cath schedule for tomorrow. I discussed the procedure but it would be nice to talk with family.  Continue aspirin, atorvastatin, and heparin. Will add beta blocker if BP allows and when permitted by neurology. Currently 105/62-131/77.   It is unclear that this was a primary ACS event. More likely 2/2 neuro or demand ischemia from hypotension.  #  Acute stroke: Cardioembolic vs watershed 2/2 hypotension. Given that the family is requesting aggressive care, consider TEE with loop if no embolic source is identified. Given her vision loss, she would be a high fall risk, which would mean she is not a candidate for anticoagulation. Need more info from family on how much she walks and falls. If she is not a candidate for anticoagulation, would not pursue TEE or loop recorder.      LOS: 5 days    Julien Berryman PA-C 01/01/2016 8:26 AM

## 2016-01-01 NOTE — Progress Notes (Signed)
[place] for Heparin  Indication: chest pain/ACS, elevated troponin  Allergies  Allergen Reactions  . Codeine Nausea Only   Patient Measurements: Height: 5\' 5"  (165.1 cm) Weight: 151 lb 7.3 oz (68.7 kg) IBW/kg (Calculated) : 57  Vital Signs: Temp: 97.6 F (36.4 C) (04/17 2024) Temp Source: Oral (04/17 2024) BP: 105/62 mmHg (04/17 2024) Pulse Rate: 29 (04/17 2024)  Labs:  Recent Labs  12/29/15 0400  12/30/15 0115  12/30/15 2320 12/31/15 0615 12/31/15 1151 12/31/15 1449 12/31/15 2230  HGB 10.6*  --  10.2*  --   --  9.3*  --   --   --   HCT 33.7*  --  32.2*  --   --  28.0*  --   --   --   PLT 136*  --  145*  --   --  143*  --   --   --   HEPARINUNFRC 0.23*  < >  --   < >  --  0.25*  --  0.43 0.23*  CREATININE 2.00*  --  1.68*  --   --  1.54*  --   --   --   TROPONINI 23.77*  < > 15.29*  < > 9.47* 7.88* 6.43*  --   --   < > = values in this interval not displayed.  Medical History: Past Medical History  Diagnosis Date  . Type 2 diabetes mellitus ([place])   . Colon cancer ([place])   . Anemia   . Essential hypertension, benign   . Mixed hyperlipidemia   . Macular degeneration   . History of pneumonia   . Iron deficiency anemia     Erosive antral gastritis  . Cognitive impairment     Assessment: 82 y/o F with elevated troponin, on heparin, troponin peaked and trending down, HL sub-therapeutic tonight, no issues per RN, went for CT earlier tonight for stroke-like symptoms but heparin was running the entire time, CT head negative for bleed.   Goal of Therapy:  Heparin level 0.3-0.5 units/ml Monitor platelets by anticoagulation protocol: Yes   Plan:  -Increase heparin to 1150 units/hr -0800 HL  Narda Bonds 01/01/2016,12:02 AM

## 2016-01-01 NOTE — Progress Notes (Signed)
PMT RN Note: Checked on pt to f/u new olanzapine rx from yesterday. Walked in to find daughter at bedside trying to calm pt down with soft speech and redirection. Pt is in mitts; she continues to pick at everything. She got her daily prn olanzapine at 0600 today. Daughter reports what the pt describes flowers on the ceiling, dogs in the room, and a rotting house in the distance (none of these are present). Daughter is distressed by this, as this behavior is new this hospital admission. Called and spoke with Dr Hilma Favors; new rx for stat olanzapine; she will come see pt this afternoon. Per cardiology, probable LHC tomorrow. Discussed pt with primary RN.  Discussed medication change, plan for Dr Hilma Favors visit, expected [facility] tomorrow. Daughter verbalized appreciation for care and states she will stay to meet Dr Hilma Favors.  Larina Earthly, RN, BSN, [facility] 01/01/2016 2:41 PM Cell [phone] 8:00-4:00 Monday-Friday Office [phone]

## 2016-01-01 NOTE — Plan of Care (Signed)
Problem: Education: Goal: Knowledge of disease or condition will improve Outcome: Not Progressing Pt is confused and agitated.

## 2016-01-02 ENCOUNTER — Encounter (HOSPITAL_COMMUNITY)
Admission: EM | Disposition: A | Discharge status: Home Health | Payer: Self-pay | Source: Home / Self Care | Attending: Internal Medicine

## 2016-01-02 DIAGNOSIS — N178 Other acute kidney failure: Secondary | ICD-10-CM

## 2016-01-02 LAB — HEPARIN LEVEL (UNFRACTIONATED)
HEPARIN UNFRACTIONATED: 0.48 [IU]/mL (ref 0.30–0.70)
Heparin Unfractionated: 0.23 IU/mL — ABNORMAL LOW (ref 0.30–0.70)

## 2016-01-02 LAB — COMPREHENSIVE METABOLIC PANEL
ALT: 51 U/L (ref 14–54)
AST: 28 U/L (ref 15–41)
Albumin: 2.8 g/dL — ABNORMAL LOW (ref 3.5–5.0)
Alkaline Phosphatase: 58 U/L (ref 38–126)
Anion gap: 6 (ref 5–15)
BILIRUBIN TOTAL: 0.4 mg/dL (ref 0.3–1.2)
BUN: 19 mg/dL (ref 6–20)
CO2: 21 mmol/L — ABNORMAL LOW (ref 22–32)
CREATININE: 1.45 mg/dL — AB (ref 0.44–1.00)
Calcium: 9 mg/dL (ref 8.9–10.3)
Chloride: 115 mmol/L — ABNORMAL HIGH (ref 101–111)
GFR, EST AFRICAN AMERICAN: 37 mL/min — AB (ref >60)
GFR, EST NON AFRICAN AMERICAN: 32 mL/min — AB (ref >60)
Glucose, Bld: 215 mg/dL — ABNORMAL HIGH (ref 65–99)
POTASSIUM: 4.4 mmol/L (ref 3.5–5.1)
Sodium: 142 mmol/L (ref 135–145)
TOTAL PROTEIN: 5.6 g/dL — AB (ref 6.5–8.1)

## 2016-01-02 LAB — CBC
HEMATOCRIT: 29.6 % — AB (ref 36.0–46.0)
Hemoglobin: 9.4 g/dL — ABNORMAL LOW (ref 12.0–15.0)
MCH: 27.2 pg (ref 26.0–34.0)
MCHC: 31.8 g/dL (ref 30.0–36.0)
MCV: 85.8 fL (ref 78.0–100.0)
PLATELETS: 165 10*3/uL (ref 150–400)
RBC: 3.45 MIL/uL — ABNORMAL LOW (ref 3.87–5.11)
RDW: 15.1 % (ref 11.5–15.5)
WBC: 8.6 10*3/uL (ref 4.0–10.5)

## 2016-01-02 LAB — GLUCOSE, CAPILLARY
GLUCOSE-CAPILLARY: 185 mg/dL — AB (ref 65–99)
GLUCOSE-CAPILLARY: 127 mg/dL — AB (ref 65–99)
Glucose-Capillary: 97 mg/dL (ref 65–99)
Glucose-Capillary: 102 mg/dL — ABNORMAL HIGH (ref 65–99)

## 2016-01-02 SURGERY — LEFT HEART CATH AND CORONARY ANGIOGRAPHY

## 2016-01-02 NOTE — Progress Notes (Addendum)
Pt more awake and alert oriented to name, Laura Parks, yr and president. Able to swallow her meds with applesauce safely.

## 2016-01-02 NOTE — Progress Notes (Signed)
Physical Therapy Treatment Patient Details Name: Laura Parks MRN: [id number] DOB: 1929/12/23 Today's Date: 01/02/2016    History of Present Illness  82 yo female admitted 12/26/15 with Lt sided weakness, slurred speech, confusion.  Patient with bilateral CVA's, elevated troponin, dehydration, acute kidney injury.  Troponin beginning to drop today, though still elevated.  PMH:  DM, colon CA, HTN, HLD, macular degeneration, legally blind, HOH, decreased cognition    PT Comments    Pt very out of it today, but still cooperative with therapy. She needed +2 assistance to ambulate today, which is worse than last session, but mostly suspected due to lethargy. Pt presents with functional limitations due to decreased balance, strength, activity tolerance, and cognitive status. Pt will continue to benefit from PT services to work towards more independence with functional mobility. Updating recommendations for SNF rehab as pt's cognitive status varies and requires +2 A frequently.    Follow Up Recommendations  SNF;Supervision/Assistance - 24 hour     Equipment Recommendations  Other (comment) (TBD)    Recommendations for Other Services       Precautions / Restrictions Precautions Precautions: Fall Precaution Comments: visual deficits Restrictions Weight Bearing Restrictions: No    Mobility  Bed Mobility Overal bed mobility: Needs Assistance Bed Mobility: Supine to Sit;Sit to Supine     Supine to sit: Max assist;HOB elevated Sit to supine: Mod assist;+2 for physical assistance   General bed mobility comments: Pt needed Max A to get to the EOB to help arouse her. Then needed Mod +2 A to get back in bed and Max A to scoot up in bed. Pt went right back to sleep and snoring as soon as we laid her back down.   Transfers Overall transfer level: Needs assistance Equipment used: 2 person hand held assist Transfers: Sit to/from Stand Sit to Stand: Min assist;+2 physical assistance         General transfer comment: Min A +2 for balance with hand hold assist on each side.   Ambulation/Gait Ambulation/Gait assistance: Mod assist;+2 physical assistance Ambulation Distance (Feet): 50 Feet Assistive device: 2 person hand held assist Gait Pattern/deviations: Step-to pattern;Decreased stride length;Scissoring;Leaning posteriorly;Drifts right/left;Trunk flexed;Narrow base of support Gait velocity: decreased Gait velocity interpretation: Below normal speed for age/gender General Gait Details: Pt could not sequence with the use of RW so did hand hold assist. Pt has heavy posterior, R sided lean. Pt frequently stopping and needed VC's for adequate step length and sequencing for turning.    Stairs            Wheelchair Mobility    Modified Rankin (Stroke Patients Only)       Balance Overall balance assessment: Needs assistance Sitting-balance support: No upper extremity supported;Feet supported Sitting balance-Leahy Scale: Fair Sitting balance - Comments: Able to sit EOB but slowly leaning posteriorly as she was very lethargic.  Postural control: Posterior lean Standing balance support: Bilateral upper extremity supported Standing balance-Leahy Scale: Zero Standing balance comment: heavy posterior, R lean.                     Cognition Arousal/Alertness: Lethargic;Suspect due to medications Behavior During Therapy: Flat affect Overall Cognitive Status: History of cognitive impairments - at baseline Area of Impairment: Awareness;Safety/judgement         Safety/Judgement: Decreased awareness of safety Awareness: Intellectual   General Comments: Pt very out of it due to sedatives administered last night as pt was combative per RN. Pt did not know where she  was and she thought we were walking in the hallway to find her kids.     Exercises General Exercises - Lower Extremity Long Arc Quad: Both;AROM;10 reps;Seated Hip Flexion/Marching: 10  reps;AROM;Both;Seated    General Comments General comments (skin integrity, edema, etc.): Pt very lethargic but cooperative with therapy.  Pt had sitter in room.       Pertinent Vitals/Pain Pain Assessment: No/denies pain. Vitals stable throughout activity.     Home Living                      Prior Function            PT Goals (current goals can now be found in the care plan section) Acute Rehab PT Goals Patient Stated Goal: none stated PT Goal Formulation: Patient unable to participate in goal setting Time For Goal Achievement: 01/05/16 Potential to Achieve Goals: Fair Progress towards PT goals: Progressing toward goals    Frequency  Min 2X/week    PT Plan Discharge plan needs to be updated    Co-evaluation             End of Session Equipment Utilized During Treatment: Gait belt Activity Tolerance: Patient limited by lethargy Patient left: in bed;with call bell/phone within reach;with bed alarm set;with nursing/sitter in room     Time: [id number] PT Time Calculation (min) (ACUTE ONLY): 15 min  Charges:  $Gait Training: 8-22 mins                    G Codes:      [place], SPT Colon Branch 01/02/2016, 11:55 AM

## 2016-01-02 NOTE — Progress Notes (Addendum)
Patient ID: Laura Parks, female   DOB: Sep 05, 1930, 82 y.o.   MRN: [id number]    PROGRESS NOTE    Laura Parks  [id number] DOB: April 01, 1930 DOA: 12/26/2015  PCP: Glenda Chroman, MD   Outpatient Specialists: Dr. Laural Golden - GI, Dr. Stanford Breed - ortho   Brief Narrative:   82 y.o. female with a history of type 2 diabetes mellitus, colon cancer, hypertension, hyperlipidemia, macular degeneration with blindness and an equivocal history of cognitive impairment, brought to the emergency room for evaluation of confusion and slurred speech that was noted several hours prior to this admission. She was unable to provide any history at the time of the admission and while in ED she has vomited multiple times. CT scan of her head showed no acute intracranial abnormality. She has remained lethargic throughout the ED evaluation. Code stroke was subsequently canceled, as acute aphasia was not clear and patient had no clear focal motor deficits.  Events since admission: 4/16 - confusion overnight, given dose of Haldol which made it worse, ativan given and pt calmed down  4/17 - sleeping, at 6 pm with slurred speech and right sided weakness, confusion, cleared up within one hour 4/18 - more alert and sitting in chair, follows commands  4/10 - sleeping this AM, received ativan earlier in the am  Assessment & Plan:   Bilateral, infra and supra-tentorial infarcts, c/w cardioembolic infarcts secondary to either atrial fibrillation vs acute MI - Resultant L facial, L arm and leg weakness, improving overall  - MRIAcute bilateral infra- and supratentorial small infarcts - Carotid Doppler and ECHO with normal EF [id number], normal systolic function, grade I diastolic CHF - currently on IV Heparin and will continue per cardiology, plan for cath today - PT eval done, recommends HH PT, OT eval done, recommends SNF - I have discussed this with daughter, decision pending  - SLP also done, pt passed eval and regular thin  diet recommended  - spoke with Dr. Leonie Man, stroke specialist, no further recommendations from their standpoint   Elevated tropoinin Acute coronary syndrome vs atrial fibrillation, ? NSTEMI, demand ischemia  - Troponin 0.05 on arrival, up to 7.77 --> 19 --> 32 --> 35 --> 39 --> 27 --> 23 --> 12 --> 6 - cardiology following, appreciate assistance  - continue with Heparin drip  - ? cardiac cath per cardio today  Confusion overnight 4/17 - unclear etiology - zyprexa seems to be helping - pt more sleepy this AM, easy to awake but doses off easily - has received dose of ativan earlier in the am so likely causing somnolence - would recommend avoiding if possible   Leukocytosis - no signs of PNA on imaging studies and no UTI - discontinued Rocephin 4/17, pt has received total of three days of rocephin  - WBC remains stable and WNL   Hypertension, essential  - reasonably stable this AM  Diabetes, type II, on oral antihyperglycemics - on Actos at home - here on SSI, diet advanced to carb modified and pt tolerating well   Acute kidney injury with metabolic acidosis  - from acute illness, pre renal etiology, demand ischemia - renal US with no acute abnormalities noted  - Cr nicely trending down and remains stable ~ 1.4 - BMP in AM  Transaminitis - from acute illness - resolved   Hyperkalemia - resolved   Thrombocytopenia - reactive  - resolved   Anemia of critical illness - no indication of transfusion at this time  DVT prophylaxis: On Heparin drip  Code Status: Full  Family Communication: Multiple family members over the phone  Disposition Plan: when cleared by cardiology team   Consultants:   Cardiology  Neurology   [name]  SW  PT/OT/SLP  Procedures:   None   Antimicrobials:   Rocephin 4/15 --> 4/17  Subjective: Sleepy, easy to awake but doses off easily   Objective: Filed Vitals:   01/02/16 0500 01/02/16 0514 01/02/16 0522 01/02/16 0712  BP:  89/48  133/70 144/68  Pulse:    89  Temp:  97.5 F (36.4 C)  99 F (37.2 C)  TempSrc:  Axillary  Axillary  Resp:  25  26  Height:      Weight: 70.4 kg (155 lb 3.3 oz)     SpO2:  100%  98%    Intake/Output Summary (Last 24 hours) at 01/02/16 0731 Last data filed at 01/02/16 0729  Gross per 24 hour  Intake 1278.28 ml  Output   1150 ml  Net 128.28 ml   Filed Weights   12/31/15 0426 01/01/16 0434 01/02/16 0500  Weight: 68.7 kg (151 lb 7.3 oz) 68.3 kg (150 lb 9.2 oz) 70.4 kg (155 lb 3.3 oz)    Examination:  General exam: sleepy, hard to stay awake, has received ativan last night  Respiratory system: Diminished breath sounds at bases, dullness to percussion at lower lobes  Cardiovascular system: S1 & S2 heard, RRR. No JVD, murmurs Gastrointestinal system: Abdomen is nondistended, soft and nontender.    Data Reviewed: I have personally reviewed following labs and imaging studies  CBC:  Recent Labs Lab 12/26/15 2141 12/27/15 2131  12/29/15 0400 12/30/15 0115 12/31/15 0615 01/01/16 0301 01/02/16 0501  WBC 9.9 13.4*  < > 13.8* 10.3 6.2 7.3 8.6  NEUTROABS 4.8 10.8*  --   --   --   --   --   --   HGB 11.8* 10.7*  < > 10.6* 10.2* 9.3* 9.3* 9.4*  HCT 35.5* 33.0*  < > 33.7* 32.2* 28.0* 28.8* 29.6*  MCV 86.4 85.9  < > 86.6 86.8 86.4 85.5 85.8  PLT 147* 137*  < > 136* 145* 143* 160 165  < > = values in this interval not displayed. Basic Metabolic Panel:  Recent Labs Lab 12/29/15 0400 12/30/15 0115 12/31/15 0615 01/01/16 0301 01/02/16 0501  NA 145 138 140 140 142  K 5.0 4.5 4.1 4.3 4.4  CL 115* 112* 114* 111 115*  CO2 20* 19* 19* 19* 21*  GLUCOSE 109* 150* 120* 114* 215*  BUN 51* 47* 32* 27* 19  CREATININE 2.00* 1.68* 1.54* 1.52* 1.45*  CALCIUM 8.6* 8.4* 8.3* 8.5* 9.0   Liver Function Tests:  Recent Labs Lab 12/27/15 2131 12/30/15 0115 12/31/15 0615 01/01/16 0301 01/02/16 0501  AST 98* 103* 56* 37 28  ALT 35 103* 80* 66* 51  ALKPHOS 63 55 55 57 58  BILITOT 0.8  0.5 0.5 0.6 0.4  PROT 6.1* 5.3* 5.0* 5.2* 5.6*  ALBUMIN 3.4* 2.8* 2.7* 2.8* 2.8*   Coagulation Profile:  Recent Labs Lab 12/26/15 2141  INR 0.94   Cardiac Enzymes:  Recent Labs Lab 12/30/15 1155 12/30/15 1752 12/30/15 2320 12/31/15 0615 12/31/15 1151  TROPONINI 9.61* 9.71* 9.47* 7.88* 6.43*   CBG:  Recent Labs Lab 12/31/15 2147 01/01/16 0741 01/01/16 1201 01/01/16 1651 01/01/16 2202  GLUCAP 118* 96 94 177* 183*   Urine analysis:    Component Value Date/Time   COLORURINE YELLOW 01/01/2016 1618  APPEARANCEUR CLEAR 01/01/2016 1618   LABSPEC 1.008 01/01/2016 1618   PHURINE 5.0 01/01/2016 1618   GLUCOSEU NEGATIVE 01/01/2016 1618   HGBUR NEGATIVE 01/01/2016 1618   BILIRUBINUR NEGATIVE 01/01/2016 1618   KETONESUR NEGATIVE 01/01/2016 1618   PROTEINUR NEGATIVE 01/01/2016 1618   NITRITE NEGATIVE 01/01/2016 1618   LEUKOCYTESUR NEGATIVE 01/01/2016 1618   Recent Results (from the past 240 hour(s))  Urine culture     Status: None   Collection Time: 12/26/15 10:25 PM  Result Value Ref Range Status   Specimen Description URINE, RANDOM  Final   Special Requests Normal  Final   Culture NO GROWTH 1 DAY  Final   Report Status 12/28/2015 FINAL  Final  MRSA PCR Screening     Status: None   Collection Time: 12/27/15  9:07 PM  Result Value Ref Range Status   MRSA by PCR NEGATIVE NEGATIVE Final    Comment:        The GeneXpert MRSA Assay (FDA approved for NASAL specimens only), is one component of a comprehensive MRSA colonization surveillance program. It is not intended to diagnose MRSA infection nor to guide or monitor treatment for MRSA infections.   Urine culture     Status: None   Collection Time: 12/29/15  7:47 PM  Result Value Ref Range Status   Specimen Description URINE, CATHETERIZED  Final   Special Requests NONE  Final   Culture NO GROWTH 2 DAYS  Final   Report Status 12/31/2015 FINAL  Final      Radiology Studies: Ct Abdomen Pelvis W  Contrast 12/27/2015   Small bilateral effusions Sigmoid diverticulosis. Negative for diverticulitis.   US Renal 12/28/2015  No hydronephrosis. Mild renal atrophy.   Scheduled Meds: . acetaminophen  650 mg Oral QID  . antiseptic oral rinse  7 mL Mouth Rinse BID  . aspirin  300 mg Rectal Daily   Or  . aspirin  325 mg Oral Daily  . atorvastatin  10 mg Oral Daily  . bisacodyl  5 mg Oral Daily  . cholestyramine light  4 g Oral BID  . citalopram  20 mg Oral Daily  . Gerhardt's butt cream   Topical TID  . insulin aspart  0-9 Units Subcutaneous TID WC  . mirtazapine  15 mg Oral QHS  . OLANZapine zydis  2.5 mg Oral QHS   Continuous Infusions: . heparin 1,250 Units/hr (01/01/16 1859)    LOS: 6 days   Time spent: 20 minutes   Faye Ramsay, MD [facility] Pager [phone]  If 7PM-7AM, please contact night-coverage [email or web address] Password [facility] 01/02/2016, 7:31 AM

## 2016-01-02 NOTE — Progress Notes (Signed)
ANTICOAGULATION CONSULT NOTE - Follow Up Consult  Pharmacy Consult for Heparin Indication: chest pain/ACS and stroke  Allergies  Allergen Reactions  . Codeine Nausea Only    Patient Measurements: Height: 5\' 5"  (165.1 cm) Weight: 155 lb 3.3 oz (70.4 kg) IBW/kg (Calculated) : 57 Heparin Dosing Weight:   Vital Signs: Temp: 99 F (37.2 C) (04/19 0712) Temp Source: Axillary (04/19 0712) BP: 128/69 mmHg (04/19 1019) Pulse Rate: 89 (04/19 0712)  Labs:  Recent Labs  12/30/15 2320  12/31/15 0615 12/31/15 1151  01/01/16 0301 01/01/16 0831 01/01/16 1819 01/02/16 0501 01/02/16 0827  HGB  --   < > 9.3*  --   --  9.3*  --   --  9.4*  --   HCT  --   --  28.0*  --   --  28.8*  --   --  29.6*  --   PLT  --   --  143*  --   --  160  --   --  165  --   HEPARINUNFRC  --   --  0.25*  --   < >  --  0.28* 0.47  --  0.23*  CREATININE  --   --  1.54*  --   --  1.52*  --   --  1.45*  --   TROPONINI 9.47*  --  7.88* 6.43*  --   --   --   --   --   --   < > = values in this interval not displayed.  Estimated Creatinine Clearance: 29 mL/min (by C-G formula based on Cr of 1.45).   Medications:  Scheduled:  . acetaminophen  650 mg Oral QID  . antiseptic oral rinse  7 mL Mouth Rinse BID  . aspirin  300 mg Rectal Daily   Or  . aspirin  325 mg Oral Daily  . atorvastatin  10 mg Oral Daily  . bisacodyl  5 mg Oral Daily  . cholestyramine light  4 g Oral BID  . citalopram  20 mg Oral Daily  . Gerhardt's butt cream   Topical TID  . insulin aspart  0-9 Units Subcutaneous TID WC  . mirtazapine  15 mg Oral QHS  . OLANZapine zydis  2.5 mg Oral QHS    Assessment: 82yo female with NSTEMI and acute stroke, for cath 4/19. Heparin level is below goal on 1250 units/hr. Hg stable & pltc wnl. No bleeding noted. Goal range adjusted for lower end due to acute stroke as well.  Possibility of cath today.   Goal of Therapy:  Heparin level 0.3-0.5 units/ml Monitor platelets by anticoagulation  protocol: Yes   Plan:  Increase Heparin to 1400 units/hr (2 uts/kg/hr) Repeat heparin level 8hr Watch for s/s of bleeding  Gracy Bruins, [facility]

## 2016-01-02 NOTE — Progress Notes (Signed)
Patient Name: Laura Parks Date of Encounter: 01/02/2016   SUBJECTIVE  Sleeping. Awake with touch and goes back to sleep.   CURRENT MEDS . acetaminophen  650 mg Oral QID  . antiseptic oral rinse  7 mL Mouth Rinse BID  . aspirin  300 mg Rectal Daily   Or  . aspirin  325 mg Oral Daily  . atorvastatin  10 mg Oral Daily  . bisacodyl  5 mg Oral Daily  . cholestyramine light  4 g Oral BID  . citalopram  20 mg Oral Daily  . Gerhardt's butt cream   Topical TID  . insulin aspart  0-9 Units Subcutaneous TID WC  . mirtazapine  15 mg Oral QHS  . OLANZapine zydis  2.5 mg Oral QHS    OBJECTIVE  Filed Vitals:   01/02/16 0514 01/02/16 0522 01/02/16 0712 01/02/16 1019  BP: 89/48 133/70 144/68 128/69  Pulse:   89   Temp: 97.5 F (36.4 C)  99 F (37.2 C)   TempSrc: Axillary  Axillary   Resp: 25  26 24   Height:      Weight:      SpO2: 100%  98%     Intake/Output Summary (Last 24 hours) at 01/02/16 1158 Last data filed at 01/02/16 1100  Gross per 24 hour  Intake 1328.28 ml  Output   1150 ml  Net 178.28 ml   Filed Weights   12/31/15 0426 01/01/16 0434 01/02/16 0500  Weight: 151 lb 7.3 oz (68.7 kg) 150 lb 9.2 oz (68.3 kg) 155 lb 3.3 oz (70.4 kg)    PHYSICAL EXAM  General: Pleasant, NAD. Neuro: Moves all extremities spontaneously. Psych: sleeping HEENT:  Normal  Neck: Supple without bruits or JVD. Lungs:  Resp regular and unlabored, CTA. Heart: RRR no s3, s4, or murmurs. Abdomen: Soft, non-tender, non-distended, BS + x 4.  Extremities: No clubbing, cyanosis or edema. DP/PT/Radials 2+ and equal bilaterally.  Accessory Clinical Findings  CBC  Recent Labs  01/01/16 0301 01/02/16 0501  WBC 7.3 8.6  HGB 9.3* 9.4*  HCT 28.8* 29.6*  MCV 85.5 85.8  PLT 160 [id number]   Basic Metabolic Panel  Recent Labs  01/01/16 0301 01/02/16 0501  NA 140 142  K 4.3 4.4  CL 111 115*  CO2 19* 21*  GLUCOSE 114* 215*  BUN 27* 19  CREATININE 1.52* 1.45*  CALCIUM 8.5* 9.0    Liver Function Tests  Recent Labs  01/01/16 0301 01/02/16 0501  AST 37 28  ALT 66* 51  ALKPHOS 57 58  BILITOT 0.6 0.4  PROT 5.2* 5.6*  ALBUMIN 2.8* 2.8*   No results for input(s): LIPASE, AMYLASE in the last 72 hours. Cardiac Enzymes  Recent Labs  12/30/15 2320 12/31/15 0615 12/31/15 1151  TROPONINI 9.47* 7.88* 6.43*    TELE  Sinus rhythm with PVCs and vent. trigemny  Radiology/Studies  Ct Head Wo Contrast  12/31/2015  CLINICAL DATA:  Cerebral infarction EXAM: CT HEAD WITHOUT CONTRAST TECHNIQUE: Contiguous axial images were obtained from the base of the skull through the vertex without intravenous contrast. COMPARISON:  MRI 12/27/2015 FINDINGS: Multiple small areas of acute infarct in the cerebellum bilaterally as well as in the cerebral hemispheres and basal ganglia bilaterally are best seen on prior MRI. These are difficult to see on CT. No large territory acute infarct. Negative for hemorrhage or mass Moderate atrophy. Mild chronic microvascular ischemic change in the white matter. Mucous retention cyst right maxillary sinus.  Calvarium intact IMPRESSION: Generalized  atrophy with mild chronic microvascular ischemia. Numerous small areas of acute infarct throughout the brain bilaterally best seen on MRI. No large territory acute infarct or hemorrhage. Electronically Signed   By: Franchot Gallo M.D.   On: 12/31/2015 19:50   Ct Head Wo Contrast  12/26/2015  ADDENDUM REPORT: 12/26/2015 22:37 ADDENDUM: Critical Value/emergent results were called by telephone at the time of interpretation on 12/26/2015 at 10:30 pm to Dr. Nicole Kindred, neurology, who verbally acknowledged these results. Electronically Signed   By: Lowella Grip III M.D.   On: 12/26/2015 22:37  12/26/2015  CLINICAL DATA:  Confusion and agitation EXAM: CT HEAD WITHOUT CONTRAST TECHNIQUE: Contiguous axial images were obtained from the base of the skull through the vertex without intravenous contrast. COMPARISON:  Brain  MRI August 09, 2014 FINDINGS: Motion artifact makes this study less than optimal. There is moderate diffuse atrophy. There is no intracranial mass, hemorrhage, extra-axial fluid collection, or midline shift. There is patchy small vessel disease in the centra semiovale bilaterally. No acute infarct is evident. The middle cerebral artery attenuation is symmetric bilaterally. IMPRESSION: Suboptimal study due to motion artifact. Atrophy with patchy periventricular small vessel disease. No acute infarct is evident. No hemorrhage or mass effect. Note that subtle early infarct could be obscured given this degree of motion artifact, particularly in the posterior fossa region. Electronically Signed: By: Lowella Grip III M.D. On: 12/26/2015 21:59   Ct Chest Wo Contrast  12/30/2015  CLINICAL DATA:  Shortness of breath and chest pain EXAM: CT CHEST WITHOUT CONTRAST TECHNIQUE: Multidetector CT imaging of the chest was performed following the standard protocol without IV contrast. COMPARISON:  07/08/2014 FINDINGS: A few small nodules are noted within the right lower lobe. The largest of these is noted on image number 33 of series 3 and measures 6 mm. Bilateral pleural effusions are noted of a small moderate degree. Mild bibasilar atelectasis is noted. A few calcified nodules are noted consistent with calcified granulomas. The previously seen left lower lobe nodule is stable in appearance best seen on image number 234 of series for. The thoracic inlet is within normal limits. The aorta is calcified with evidence of coronary calcifications and mitral annular calcifications. No significant hilar or mediastinal adenopathy is noted. The upper abdomen shows evidence of prior cholecystectomy. No other focal abnormality is seen. The osseous structures show degenerative change of the thoracic spine. IMPRESSION: Few scattered small nodules in the lungs bilaterally. These are stable from the prior exam and felt to be benign in  etiology. Changes of prior granulomatous disease. Bilateral pleural effusions with mild bibasilar atelectasis. Electronically Signed   By: Inez Catalina M.D.   On: 12/30/2015 18:15   Mr Brain Wo Contrast  12/27/2015  CLINICAL DATA:  Patient found down. Erratic behavior. Word salad, facial droop. Not recognizing family. Confusion and agitation. Hypertension. Diabetes. EXAM: MRI HEAD WITHOUT CONTRAST MRA HEAD WITHOUT CONTRAST TECHNIQUE: Multiplanar, multiecho pulse sequences of the brain and surrounding structures were obtained without intravenous contrast. Angiographic images of the head were obtained using MRA technique without contrast. COMPARISON:  CT head 12/26/2015.  MR head 08/09/2014. FINDINGS: MRI HEAD FINDINGS Multifocal subcentimeter areas of restricted diffusion throughout both cerebral hemispheres, also involving the basal ganglia, also involving BILATERAL cerebellum in a symmetric fashion, consistent with watershed infarcts. Multiple emboli are not excluded but less favored, given symmetry and multiplicity. No hemorrhage, mass lesion,  or extra-axial fluid. Global atrophy. Chronic microvascular ischemic change. Remote lacunar infarcts. Flow voids are maintained. No midline  abnormalities. Extracranial soft tissues unremarkable. BILATERAL cataract extraction. Compared with prior CT, the infarcts are not visible. Compared with prior MR, the infarcts are new. MRA HEAD FINDINGS Anterior circulation: Moderate irregularity of the cavernous ICA on the RIGHT, potential 50-75% stenosis. Mild narrowing of the supraclinoid ICA on the RIGHT. LEFT ICA demonstrates mild non stenotic irregularity throughout its skullbase and supraclinoid segments. No MCA stenosis or occlusion. Moderate irregularity of the non dominant A1 ACA on the RIGHT. Moderate irregularity of the distal MCA branches, insufficiently well-visualized to characterize stenosis versus occlusion distally. Unremarkable distal anterior cerebral arteries.  Posterior circulation: Basilar artery widely patent. Vertebrals codominant. Unremarkable posterior cerebral arteries. No visible cerebellar branch occlusion. No visible intracranial aneurysm. IMPRESSION: Multifocal areas of acute nonhemorrhagic infarction, subcentimeter in size of a symmetric nature affecting the cortex, subcortical white matter, basal ganglia, and cerebellum. These are most consistent with watershed infarcts, commonly associated with cardiac arrhythmia or hypotensive event. Shower of emboli not excluded but less favored. Atrophy and small vessel disease similar to priors. No large vessel occlusion on MRA. Potentially flow reducing stenosis of the cavernous ICA on the RIGHT, 50-75%. Electronically Signed   By: Staci Righter M.D.   On: 12/27/2015 10:09   Ct Abdomen Pelvis W Contrast  12/27/2015  CLINICAL DATA:  Vomiting.  History colon cancer. EXAM: CT ABDOMEN AND PELVIS WITH CONTRAST TECHNIQUE: Multidetector CT imaging of the abdomen and pelvis was performed using the standard protocol following bolus administration of intravenous contrast. CONTRAST:  61mL ISOVUE-300 IOPAMIDOL (ISOVUE-300) INJECTION 61% COMPARISON:  None. FINDINGS: Lower chest: Mild pleural effusion bilaterally with mild compressive atelectasis in the bases. Extensive calcification of the mitral annulus. Heart size normal Hepatobiliary: Postop cholecystectomy. Bile ducts nondilated. No liver lesion. Pancreas: Negative Spleen: Negative Adrenals/Urinary Tract: Negative for renal obstruction. No mass or stone. Urinary bladder normal. Stomach/Bowel: Negative for bowel obstruction. Moderate sigmoid diverticulosis. Negative for diverticulitis. Surgical clips in the cecum presumably from appendectomy. No evidence of acute appendicitis. Vascular/Lymphatic: Atherosclerotic calcification in the aorta and iliac arteries. No aneurysm. Negative for lymphadenopathy Reproductive: Negative for a uterine mass.  No pelvic mass. Other: No  free-fluid Musculoskeletal: Limit evaluation of the lumbar spine due to motion. Image quality degraded by motion. The patient moved significantly midway through the scan IMPRESSION: Small bilateral effusions Sigmoid diverticulosis. Negative for diverticulitis. Probable appendectomy clips in the cecum. Electronically Signed   By: Franchot Gallo M.D.   On: 12/27/2015 14:34   US Renal  12/28/2015  CLINICAL DATA:  Acute renal failure EXAM: RENAL / URINARY TRACT ULTRASOUND COMPLETE COMPARISON:  CT abdomen 12/27/2015 FINDINGS: Right Kidney: Length: 9.4 cm. Mild cortical thinning. Normal renal cortical echogenicity. No hydronephrosis. Left Kidney: Length: 9.5 cm. Mild cortical thinning. Normal renal cortical echogenicity. No hydronephrosis. Bladder: Appears normal for degree of bladder distention. Right pleural effusion. IMPRESSION: No hydronephrosis. Mild renal atrophy. Electronically Signed   By: Lovey Newcomer M.D.   On: 12/28/2015 17:17   Dg Chest Port 1 View  12/26/2015  CLINICAL DATA:  Shortness of breath with altered mental status EXAM: PORTABLE CHEST 1 VIEW COMPARISON:  December 10, 2010 FINDINGS: There is diffuse interstitial edema. There is cardiomegaly with pulmonary venous hypertension. No airspace consolidation. No adenopathy. There is calcification in the mitral annulus. There is degenerative change in the thoracic spine. IMPRESSION: Findings consistent with congestive heart failure. No airspace consolidation. Electronically Signed   By: Lowella Grip III M.D.   On: 12/26/2015 23:33   Mr Jodene Nam Head/brain Wo Cm  12/27/2015  CLINICAL DATA:  Patient found down. Erratic behavior. Word salad, facial droop. Not recognizing family. Confusion and agitation. Hypertension. Diabetes. EXAM: MRI HEAD WITHOUT CONTRAST MRA HEAD WITHOUT CONTRAST TECHNIQUE: Multiplanar, multiecho pulse sequences of the brain and surrounding structures were obtained without intravenous contrast. Angiographic images of the head were  obtained using MRA technique without contrast. COMPARISON:  CT head 12/26/2015.  MR head 08/09/2014. FINDINGS: MRI HEAD FINDINGS Multifocal subcentimeter areas of restricted diffusion throughout both cerebral hemispheres, also involving the basal ganglia, also involving BILATERAL cerebellum in a symmetric fashion, consistent with watershed infarcts. Multiple emboli are not excluded but less favored, given symmetry and multiplicity. No hemorrhage, mass lesion,  or extra-axial fluid. Global atrophy. Chronic microvascular ischemic change. Remote lacunar infarcts. Flow voids are maintained. No midline abnormalities. Extracranial soft tissues unremarkable. BILATERAL cataract extraction. Compared with prior CT, the infarcts are not visible. Compared with prior MR, the infarcts are new. MRA HEAD FINDINGS Anterior circulation: Moderate irregularity of the cavernous ICA on the RIGHT, potential 50-75% stenosis. Mild narrowing of the supraclinoid ICA on the RIGHT. LEFT ICA demonstrates mild non stenotic irregularity throughout its skullbase and supraclinoid segments. No MCA stenosis or occlusion. Moderate irregularity of the non dominant A1 ACA on the RIGHT. Moderate irregularity of the distal MCA branches, insufficiently well-visualized to characterize stenosis versus occlusion distally. Unremarkable distal anterior cerebral arteries. Posterior circulation: Basilar artery widely patent. Vertebrals codominant. Unremarkable posterior cerebral arteries. No visible cerebellar branch occlusion. No visible intracranial aneurysm. IMPRESSION: Multifocal areas of acute nonhemorrhagic infarction, subcentimeter in size of a symmetric nature affecting the cortex, subcortical white matter, basal ganglia, and cerebellum. These are most consistent with watershed infarcts, commonly associated with cardiac arrhythmia or hypotensive event. Shower of emboli not excluded but less favored. Atrophy and small vessel disease similar to priors. No  large vessel occlusion on MRA. Potentially flow reducing stenosis of the cavernous ICA on the RIGHT, 50-75%. Electronically Signed   By: Staci Righter M.D.   On: 12/27/2015 10:09    ASSESSMENT AND PLAN Active Problems:   Essential hypertension, benign   Type 2 diabetes mellitus (HCC)   Hyperkalemia   CHF (congestive heart failure) (HCC)   Acute encephalopathy   TIA (transient ischemic attack)   AKI (acute kidney injury) ([place])   Dehydration   Legal blindness   Elevated troponin   Palliative care encounter   Acute renal failure (HCC)   Altered mental status   Stroke [facility])   NSTEMI (non-ST elevated myocardial infarction) (HCC)   PAF (paroxysmal atrial fibrillation) (HCC)   Acute delirium   Vascular dementia    NSTEMI: Troponin trending down. Echo this admission showed LV EF of 60-65%, no wm abnormality. Grade 1 DD, mild MR. Creatinine further improved to 1.45. For LHC today. Follow renal function closely.   Acute CVA: Cardioembolic vs watershed 2/2 hypotension.TEE or loop recorder per primary and stroke team.   Signed, Leanor Kail PA-C Pager [phone]

## 2016-01-02 NOTE — Progress Notes (Signed)
   01/02/16 1000  OT Visit Information  Last OT Received On 01/02/16  Assistance Needed +2  History of Present Illness 82 yo female admitted 12/26/15 with Lt sided weakness, slurred speech, confusion.  Patient with bilateral CVA's, elevated troponin, dehydration, acute kidney injury.  Troponin beginning to drop today, though still elevated.  PMH:  DM, colon CA, HTN, HLD, macular degeneration, legally blind, HOH, decreased cognition  OT Time Calculation  OT Start Time (ACUTE ONLY) 0835  OT Stop Time (ACUTE ONLY) 0851  OT Time Calculation (min) 16 min  Precautions  Precautions Fall  Precaution Comments visual deficits  Pain Assessment  Pain Assessment No/denies pain  Cognition  Arousal/Alertness Lethargic;Suspect due to medications  Behavior During Therapy Flat affect  Overall Cognitive Status History of cognitive impairments - at baseline  Awareness Intellectual  General Comments Pt reports being at therapist house when asked  ADL  Overall ADL's  Needs assistance/impaired  Eating/Feeding Details (indicate cue type and reason) too lethargic to eat  Grooming Wash/dry face;Oral care;Minimal assistance;Sitting  Grooming Details (indicate cue type and reason) needed cues to complete and detail to task  Toilet Transfer +2 for physical assistance;Minimal assistance  General ADL Comments Pt supine asleep on arrival. Of note is mediation provided this AM coudl be sedating. pt aroused with several attempts. pt sitting EOB for grooming task. pt return to supine and immediately back to sleep  Bed Mobility  Overal bed mobility Needs Assistance  Bed Mobility Supine to Sit  Supine to sit +2 for physical assistance;Mod assist  Sit to supine Mod assist;+2 for physical assistance  General bed mobility comments cues for sequence  Balance  Overall balance assessment Needs assistance  Sitting-balance support No upper extremity supported;Feet supported  Sitting balance-Leahy Scale Fair  Transfers   Overall transfer level Needs assistance  Equipment used Rolling walker (2 wheeled)  Transfers Sit to/from Stand  Sit to Stand +2 physical assistance;Min assist  OT - End of Session  Equipment Utilized During Treatment Gait belt;Rolling walker  Activity Tolerance Patient tolerated treatment well  Patient left in bed;with call bell/phone within reach;with bed alarm set;with nursing/sitter in room  Nurse Communication Mobility status;Precautions  OT Assessment/Plan  OT Plan Discharge plan remains appropriate  OT Frequency (ACUTE ONLY) Min 2X/week  Follow Up Recommendations SNF  OT Equipment None recommended by OT  OT Goal Progression  Progress towards OT goals Progressing toward goals  Acute Rehab OT Goals  Patient Stated Goal to go home  OT Goal Formulation Patient unable to participate in goal setting  Time For Goal Achievement 01/14/16  Potential to Achieve Goals Good  ADL Goals  Pt Will Perform Grooming sitting;with min guard assist  Pt Will Perform Upper Body Bathing with min guard assist;sitting  Pt Will Perform Lower Body Bathing with mod assist;sit to/from stand  Pt Will Transfer to Toilet with min assist;bedside commode;ambulating  OT General Charges  $OT Visit 1 Procedure  OT Treatments  $Self Care/Home Management  8-22 mins    Jeri Modena   OTR/L Pager: [phone] Office: [phone] .

## 2016-01-02 NOTE — Progress Notes (Signed)
Palliative Medicine RN Note: Pt is asleep. Per unit staff, she has been sleeping most of the day. Discussed case with Dr Hilma Favors, who feels that pt will benefit from resting/sleeping until the heart cath is complete. Called daughter to update Jhonnie Garner [phone]); verbalized appreciation for call and understanding of pt's status. Plan f/u by PMT member tomorrow and prn.  Larina Earthly, RN, BSN, [facility] 01/02/2016 11:45 AM Cell [phone] 8:00-4:00 Monday-Friday Office [phone]

## 2016-01-02 NOTE — Progress Notes (Signed)
Pt too sleepy to be able to safely take her PO meds. Will attempt when she is more alert. ASA given by suppository mode

## 2016-01-02 NOTE — Progress Notes (Signed)
ANTICOAGULATION CONSULT NOTE - Follow Up Consult  Pharmacy Consult for Heparin Indication: chest pain/ACS and stroke  Allergies  Allergen Reactions  . Codeine Nausea Only    Patient Measurements: Height: 5\' 5"  (165.1 cm) Weight: 155 lb 3.3 oz (70.4 kg) IBW/kg (Calculated) : 57 Heparin Dosing Weight:   Vital Signs: Temp: 98.3 F (36.8 C) (04/19 2000) Temp Source: Oral (04/19 2000) BP: 128/68 mmHg (04/19 2000) Pulse Rate: 75 (04/19 2000)  Labs:  Recent Labs  12/30/15 2320  12/31/15 0615 12/31/15 1151  01/01/16 0301  01/01/16 1819 01/02/16 0501 01/02/16 0827 01/02/16 2030  HGB  --   < > 9.3*  --   --  9.3*  --   --  9.4*  --   --   HCT  --   --  28.0*  --   --  28.8*  --   --  29.6*  --   --   PLT  --   --  143*  --   --  160  --   --  165  --   --   HEPARINUNFRC  --   --  0.25*  --   < >  --   < > 0.47  --  0.23* 0.48  CREATININE  --   --  1.54*  --   --  1.52*  --   --  1.45*  --   --   TROPONINI 9.47*  --  7.88* 6.43*  --   --   --   --   --   --   --   < > = values in this interval not displayed.  Estimated Creatinine Clearance: 29 mL/min (by C-G formula based on Cr of 1.45).   Medications:  Scheduled:  . acetaminophen  650 mg Oral QID  . antiseptic oral rinse  7 mL Mouth Rinse BID  . aspirin  300 mg Rectal Daily   Or  . aspirin  325 mg Oral Daily  . atorvastatin  10 mg Oral Daily  . bisacodyl  5 mg Oral Daily  . cholestyramine light  4 g Oral BID  . citalopram  20 mg Oral Daily  . Gerhardt's butt cream   Topical TID  . insulin aspart  0-9 Units Subcutaneous TID WC  . mirtazapine  15 mg Oral QHS  . OLANZapine zydis  2.5 mg Oral QHS    Assessment: 82yo female with NSTEMI and acute stroke. Hg stable & pltc wnl. No bleeding noted.Goal range adjusted for lower end due to acute stroke as well.   Cath cancelled until mental status stabilizes. HL therapeutic x 1 (0.48) on 1400 units/h. CBC stable, no bleed documented.   Goal of Therapy:  Heparin level  0.3-0.5 units/ml Monitor platelets by anticoagulation protocol: Yes   Plan:  Continue Heparin at 1400 units/h 8h HL to confirm Daily HL/CBC Watch for s/s of bleeding  Elicia Lamp, PharmD, [facility] Clinical Pharmacist Pager [phone] 01/02/2016 9:23 PM

## 2016-01-03 LAB — CBC
HCT: 29.1 % — ABNORMAL LOW (ref 36.0–46.0)
HEMOGLOBIN: 9.2 g/dL — AB (ref 12.0–15.0)
MCH: 27.5 pg (ref 26.0–34.0)
MCHC: 31.6 g/dL (ref 30.0–36.0)
MCV: 87.1 fL (ref 78.0–100.0)
Platelets: 186 10*3/uL (ref 150–400)
RBC: 3.34 MIL/uL — AB (ref 3.87–5.11)
RDW: 15.6 % — ABNORMAL HIGH (ref 11.5–15.5)
WBC: 8.3 10*3/uL (ref 4.0–10.5)

## 2016-01-03 LAB — GLUCOSE, CAPILLARY
GLUCOSE-CAPILLARY: 112 mg/dL — AB (ref 65–99)
Glucose-Capillary: 86 mg/dL (ref 65–99)
Glucose-Capillary: 201 mg/dL — ABNORMAL HIGH (ref 65–99)
Glucose-Capillary: 168 mg/dL — ABNORMAL HIGH (ref 65–99)

## 2016-01-03 LAB — BASIC METABOLIC PANEL
ANION GAP: 9 (ref 5–15)
BUN: 16 mg/dL (ref 6–20)
CHLORIDE: 114 mmol/L — AB (ref 101–111)
CO2: 20 mmol/L — AB (ref 22–32)
Calcium: 9.2 mg/dL (ref 8.9–10.3)
Creatinine, Ser: 1.26 mg/dL — ABNORMAL HIGH (ref 0.44–1.00)
GFR calc non Af Amer: 38 mL/min — ABNORMAL LOW (ref >60)
GFR, EST AFRICAN AMERICAN: 44 mL/min — AB (ref >60)
GLUCOSE: 118 mg/dL — AB (ref 65–99)
POTASSIUM: 4.5 mmol/L (ref 3.5–5.1)
Sodium: 143 mmol/L (ref 135–145)

## 2016-01-03 LAB — HEPARIN LEVEL (UNFRACTIONATED)
HEPARIN UNFRACTIONATED: 0.57 [IU]/mL (ref 0.30–0.70)
Heparin Unfractionated: 0.64 IU/mL (ref 0.30–0.70)

## 2016-01-03 MED ORDER — SODIUM CHLORIDE 0.9% FLUSH
3.0000 mL | Freq: Two times a day (BID) | INTRAVENOUS | Status: DC
  Administered 2016-01-03 – 2016-01-04 (×2): 3 mL via INTRAVENOUS

## 2016-01-03 MED ORDER — SODIUM CHLORIDE 0.9 % IV SOLN: INTRAVENOUS | Status: DC

## 2016-01-03 MED ORDER — SODIUM CHLORIDE 0.9 % IV SOLN: 250.0000 mL | INTRAVENOUS | Status: DC | PRN

## 2016-01-03 MED ORDER — ASPIRIN 81 MG PO CHEW: 81.0000 mg | CHEWABLE_TABLET | ORAL | Status: DC

## 2016-01-03 MED ORDER — ASPIRIN 81 MG PO CHEW
81.0000 mg | CHEWABLE_TABLET | ORAL | Status: AC
  Administered 2016-01-04: 81 mg via ORAL
  Filled 2016-01-03: qty 1

## 2016-01-03 MED ORDER — SODIUM CHLORIDE 0.9% FLUSH: 3.0000 mL | INTRAVENOUS | Status: DC | PRN

## 2016-01-03 MED ORDER — GLUCERNA SHAKE PO LIQD
237.0000 mL | Freq: Three times a day (TID) | ORAL | Status: DC
Start: 2016-01-03 — End: 2016-01-06
  Administered 2016-01-03 (×2): 237 mL via ORAL

## 2016-01-03 MED ORDER — SODIUM CHLORIDE 0.9% FLUSH
3.0000 mL | Freq: Two times a day (BID) | INTRAVENOUS | Status: DC
Start: 2016-01-03 — End: 2016-01-03

## 2016-01-03 MED ORDER — SODIUM CHLORIDE 0.9 % IV SOLN
INTRAVENOUS | Status: DC
  Administered 2016-01-04: 11:00:00 via INTRAVENOUS

## 2016-01-03 NOTE — Clinical Social Work Placement (Signed)
   CLINICAL SOCIAL WORK PLACEMENT  NOTE  Date:  01/03/2016  Patient Details  Name: Laura Parks MRN: [id number] Date of Birth: 1930/05/06  Clinical Social Work is seeking post-discharge placement for this patient at the [place] level of care (*CSW will initial, date and re-position this form in  chart as items are completed):  Yes   Patient/family provided with [place] Work Department's list of facilities offering this level of care within the geographic area requested by the patient (or if unable, by the patient's family).  Yes   Patient/family informed of their freedom to choose among providers that offer the needed level of care, that participate in Medicare, Medicaid or managed care program needed by the patient, have an available bed and are willing to accept the patient.  Yes   Patient/family informed of [facility]'s ownership interest in [facility] and [facility], as well as of the fact that they are under no obligation to receive care at these facilities.  PASRR submitted to EDS on       PASRR number received on       Existing PASRR number confirmed on 01/03/16     FL2 transmitted to all facilities in geographic area requested by pt/family on 01/03/16     FL2 transmitted to all facilities within larger geographic area on       Patient informed that his/her managed care company has contracts with or will negotiate with certain facilities, including the following:        Yes   Patient/family informed of bed offers received.  Patient chooses bed at       Physician recommends and patient chooses bed at      Patient to be transferred to   on  .  Patient to be transferred to facility by       Patient family notified on   of transfer.  Name of family member notified:        PHYSICIAN       Additional Comment:    _______________________________________________ Benard Halsted, [place] 01/03/2016, 2:57 PM

## 2016-01-03 NOTE — Progress Notes (Signed)
ANTICOAGULATION CONSULT NOTE - Follow Up Consult  Pharmacy Consult for Heparin Indication: chest pain/ACS and stroke  Allergies  Allergen Reactions  . Codeine Nausea Only    Patient Measurements: Height: 5\' 5"  (165.1 cm) Weight: 152 lb 8.9 oz (69.2 kg) IBW/kg (Calculated) : 57 Heparin Dosing Weight:   Vital Signs: Temp: 98.1 F (36.7 C) (04/20 0400) Temp Source: Axillary (04/20 0400) BP: 135/82 mmHg (04/20 0400) Pulse Rate: 79 (04/20 0400)  Labs:  Recent Labs  01/01/16 0301  01/02/16 0501 01/02/16 0827 01/02/16 2030 01/03/16 0347 01/03/16 0907  HGB 9.3*  --  9.4*  --   --  9.2*  --   HCT 28.8*  --  29.6*  --   --  29.1*  --   PLT 160  --  165  --   --  186  --   HEPARINUNFRC  --   < >  --  0.23* 0.48 0.64  --   CREATININE 1.52*  --  1.45*  --   --   --  1.26*  < > = values in this interval not displayed.  Estimated Creatinine Clearance: 33.1 mL/min (by C-G formula based on Cr of 1.26).   Medications:  Scheduled:  . acetaminophen  650 mg Oral QID  . antiseptic oral rinse  7 mL Mouth Rinse BID  . aspirin  300 mg Rectal Daily   Or  . aspirin  325 mg Oral Daily  . atorvastatin  10 mg Oral Daily  . bisacodyl  5 mg Oral Daily  . cholestyramine light  4 g Oral BID  . citalopram  20 mg Oral Daily  . feeding supplement (GLUCERNA SHAKE)  237 mL Oral TID BM  . Gerhardt's butt cream   Topical TID  . insulin aspart  0-9 Units Subcutaneous TID WC  . mirtazapine  15 mg Oral QHS  . OLANZapine zydis  2.5 mg Oral QHS    Assessment: 82yo female with NSTEMI and acute stroke.  Cath was cancelled yesterday. Heparin level is above goal on 1400 units/hr. Hg stable & pltc wnl. No bleeding noted. Goal range adjusted for lower end due to acute stroke as well.    Goal of Therapy:  Heparin level 0.3-0.5 units/ml Monitor platelets by anticoagulation protocol: Yes  Plan:  Decrease Heparin to 1300 units/hr Check heparin level in 6hr Watch for s/s of bleeding  Gracy Bruins, [facility]

## 2016-01-03 NOTE — NC FL2 (Signed)
[facility] LEVEL OF CARE SCREENING TOOL     IDENTIFICATION  Patient Name: Laura Parks Birthdate: 1929/10/23 Sex: female Admission Date (Current Location): 12/26/2015  [facility] and [place] Number:  [employer] and Address:  [facility] [place]. [facility], [place] [address], [place], [place] [address]      Provider Number:    Attending Physician Name and Address:  Theodis Blaze, MD  Relative Name and Phone Number:   (daughter Jhonnie Garner) [phone])    Current Level of Care: Hospital Recommended Level of Care: [place] Prior Approval Number:    Date Approved/Denied:   PASRR Number:   [id number]   Discharge Plan: SNF    Current Diagnoses: Patient Active Problem List   Diagnosis Date Noted  . Acute delirium 12/31/2015  . Vascular dementia 12/31/2015  . PAF (paroxysmal atrial fibrillation) ([place])   . Elevated troponin 12/28/2015  . Palliative care encounter   . Acute renal failure ([place])   . Altered mental status   . Stroke ([place])   . NSTEMI (non-ST elevated myocardial infarction) ([place])   . Hyperkalemia 12/27/2015  . CHF (congestive heart failure) ([place]) 12/27/2015  . Acute encephalopathy 12/27/2015  . TIA (transient ischemic attack) 12/27/2015  . AKI (acute kidney injury) ([place]) 12/27/2015  . Dehydration 12/27/2015  . Legal blindness 12/27/2015  . B12 deficiency 09/02/2014  . Gait abnormality 07/26/2014  . Delusions ([place]) 07/26/2014  . Depression 07/26/2014  . Cognitive impairment   . Type 2 diabetes mellitus ([place]) 02/02/2014  . Carotid artery occlusion 02/02/2014  . PVC's (premature ventricular contractions) 02/02/2014  . Mixed hyperlipidemia 02/23/2012  . Essential hypertension, benign 02/23/2012    Orientation RESPIRATION BLADDER Height & Weight     Self, Time, Situation, Place  Normal Continent Weight: 152 lb 8.9 oz (69.2 kg) Height:  5\' 5"  (165.1 cm)  BEHAVIORAL SYMPTOMS/MOOD NEUROLOGICAL BOWEL  NUTRITION STATUS      Continent Diet (regular)  AMBULATORY STATUS COMMUNICATION OF NEEDS Skin   Supervision Verbally Normal                       Personal Care Assistance Level of Assistance  Bathing, Feeding, Dressing Bathing Assistance: Limited assistance Feeding assistance: Independent Dressing Assistance: Limited assistance     Functional Limitations Info  Sight, Hearing, Speech Sight Info: Adequate Hearing Info: Adequate Speech Info: Adequate    SPECIAL CARE FACTORS FREQUENCY  PT (By licensed PT), OT (By licensed OT)     PT Frequency: Min 2X/week OT Frequency: Min 2X/week            Contractures      Additional Factors Info  Code Status, Allergies Code Status Info: full Allergies Info: codeine           Current Medications (01/03/2016):  This is the current hospital active medication list Current Facility-Administered Medications  Medication Dose Route Frequency Provider Last Rate Last Dose  . acetaminophen (TYLENOL) tablet 650 mg  650 mg Oral Q4H PRN Toy Baker, MD   650 mg at 12/29/15 2206   Or  . acetaminophen (TYLENOL) suppository 650 mg  650 mg Rectal Q4H PRN Toy Baker, MD      . acetaminophen (TYLENOL) tablet 650 mg  650 mg Oral QID Acquanetta Chain, DO   650 mg at 01/03/16 0944  . antiseptic oral rinse (CPC / CETYLPYRIDINIUM CHLORIDE 0.05%) solution 7 mL  7 mL Mouth Rinse BID Toy Baker, MD  7 mL at 01/03/16 0944  . aspirin suppository 300 mg  300 mg Rectal Daily Toy Baker, MD   300 mg at 01/02/16 1020   Or  . aspirin tablet 325 mg  325 mg Oral Daily Toy Baker, MD   325 mg at 01/03/16 0944  . atorvastatin (LIPITOR) tablet 10 mg  10 mg Oral Daily Theodis Blaze, MD   10 mg at 01/02/16 1609  . bisacodyl (DULCOLAX) EC tablet 5 mg  5 mg Oral Daily Acquanetta Chain, DO   5 mg at 01/03/16 0944  . cholestyramine light (PREVALITE) packet 4 g  4 g Oral BID Toy Baker, MD   4 g at 01/01/16 1754  .  citalopram (CELEXA) tablet 20 mg  20 mg Oral Daily Theodis Blaze, MD   20 mg at 01/03/16 0944  . Gerhardt's butt cream   Topical TID Acquanetta Chain, DO      . heparin ADULT infusion 100 units/mL (25000 units/250 mL)  1,400 Units/hr Intravenous Continuous Jaquita Folds, RPH 14 mL/hr at 01/02/16 1828 1,400 Units/hr at 01/02/16 1828  . insulin aspart (novoLOG) injection 0-9 Units  0-9 Units Subcutaneous TID WC Theodis Blaze, MD   1 Units at 01/02/16 1224  . mirtazapine (REMERON) tablet 15 mg  15 mg Oral QHS Theodis Blaze, MD   15 mg at 01/02/16 2027  . OLANZapine zydis (ZYPREXA) disintegrating tablet 10 mg  10 mg Oral Daily PRN Acquanetta Chain, DO   10 mg at 01/01/16 2157  . OLANZapine zydis (ZYPREXA) disintegrating tablet 2.5 mg  2.5 mg Oral QHS Acquanetta Chain, DO   2.5 mg at 01/02/16 2026     Discharge Medications: Please see discharge summary for a list of discharge medications.  Relevant Imaging Results:  Relevant Lab Results:   Additional Information SS# [id number]  Minta Balsam  BSW intern  [phone]

## 2016-01-03 NOTE — Progress Notes (Signed)
PATIENT ID: [id number] with diabetes mellitus, colon cancer, hypertension, hyperlipidemia and macular degeneration with blindness here with acute confusion noted to have watershed infarcts on MRI consistent with cardioembolic source or hypotension.  Also with NSTEMI with troponin peak of 38.    SUBJECTIVE:  Felling well. Denies chest pain or shortness of breath.   PHYSICAL EXAM Filed Vitals:   01/03/16 0000 01/03/16 0200 01/03/16 0400 01/03/16 0500  BP: 148/77 138/74 135/82   Pulse: 78 78 79   Temp: 97.9 F (36.6 C)  98.1 F (36.7 C)   TempSrc: Oral  Axillary   Resp: 23 27 26    Height:      Weight:    69.2 kg (152 lb 8.9 oz)  SpO2: 95% 99% 98%    General:  Well-appearing.  No acute distress.  Neck: No JVD Lungs:  CTAB.  No crackles, rhonchi or wheezes Heart:  RRR.  No m/r/g.  Normal S1/S2 Abdomen:  Soft, NT, ND.  +BS  Extremities:  WWP.  No edema.   LABS: Lab Results  Component Value Date   TROPONINI 6.43* 12/31/2015   Results for orders placed or performed during the hospital encounter of 12/26/15 (from the past 24 hour(s))  Glucose, capillary     Status: Abnormal   Collection Time: 01/02/16 12:22 PM  Result Value Ref Range   Glucose-Capillary 127 (H) 65 - 99 mg/dL  Glucose, capillary     Status: None   Collection Time: 01/02/16  5:10 PM  Result Value Ref Range   Glucose-Capillary 97 65 - 99 mg/dL  Heparin level (unfractionated)     Status: None   Collection Time: 01/02/16  8:30 PM  Result Value Ref Range   Heparin Unfractionated 0.48 0.30 - 0.70 IU/mL  Glucose, capillary     Status: Abnormal   Collection Time: 01/02/16  9:50 PM  Result Value Ref Range   Glucose-Capillary 102 (H) 65 - 99 mg/dL  CBC     Status: Abnormal   Collection Time: 01/03/16  3:47 AM  Result Value Ref Range   WBC 8.3 4.0 - 10.5 K/uL   RBC 3.34 (L) 3.87 - 5.11 MIL/uL   Hemoglobin 9.2 (L) 12.0 - 15.0 g/dL   HCT 29.1 (L) 36.0 - 46.0 %   MCV 87.1 78.0 - 100.0 fL   MCH 27.5 26.0 - 34.0 pg   MCHC  31.6 30.0 - 36.0 g/dL   RDW 15.6 (H) 11.5 - 15.5 %   Platelets 186 150 - 400 K/uL  Heparin level (unfractionated)     Status: None   Collection Time: 01/03/16  3:47 AM  Result Value Ref Range   Heparin Unfractionated 0.64 0.30 - 0.70 IU/mL    Intake/Output Summary (Last 24 hours) at 01/03/16 0917 Last data filed at 01/03/16 0700  Gross per 24 hour  Intake    548 ml  Output      0 ml  Net    548 ml    Telemetry: Sinus rhythm.  Frequent, multiform PVCs.   ASSESSMENT AND PLAN:  Active Problems:   Essential hypertension, benign   Type 2 diabetes mellitus (HCC)   Hyperkalemia   CHF (congestive heart failure) (HCC)   Acute encephalopathy   TIA (transient ischemic attack)   AKI (acute kidney injury) ([place])   Dehydration   Legal blindness   Elevated troponin   Palliative care encounter   Acute renal failure ([place])   Altered mental status   Stroke [facility])   NSTEMI (non-ST elevated  myocardial infarction) ([place])   PAF (paroxysmal atrial fibrillation) ([place])   Acute delirium   Vascular dementia   # NSTEMI: Ms. Evertsen is doing much better today. She is alert and oriented to self which is her baseline.Given her improvement in mental status we will plan for left heart cath tomorrow, as she is currently eating breakfast. Continue aspirin, atorvastatin, and heparin.  She does have macular degeneration. After discussing with her daughter yesterday she has not had any recent falls and is very careful with walking.    # Acute stroke: Cardioembolic vs watershed 2/2 hypotension.  There are no plans for TEE or loop recorder, as she is not thought to be a good candidate for anticoagulation.   Marcelles Clinard C. Oval Linsey, MD, [facility] 01/03/2016 9:17 AM

## 2016-01-03 NOTE — Progress Notes (Signed)
Patient ID: Laura Parks, female   DOB: 1930/01/31, 82 y.o.   MRN: [id number]    PROGRESS NOTE    MAGALINE KOSIOREK  [id number] DOB: 12-04-1929 DOA: 12/26/2015  PCP: Glenda Chroman, MD   Outpatient Specialists: Dr. Laural Golden - GI, Dr. Stanford Breed - ortho   Brief Narrative:   82 y.o. female with a history of type 2 diabetes mellitus, colon cancer, hypertension, hyperlipidemia, macular degeneration with blindness and an equivocal history of cognitive impairment, brought to the emergency room for evaluation of confusion and slurred speech that was noted several hours prior to this admission. She was unable to provide any history at the time of the admission and while in ED she has vomited multiple times. CT scan of her head showed no acute intracranial abnormality. She has remained lethargic throughout the ED evaluation. Code stroke was subsequently canceled, as acute aphasia was not clear and patient had no clear focal motor deficits.  Events since admission: 4/16 - confusion overnight, given dose of Haldol which made it worse, ativan given and pt calmed down  4/17 - sleeping, at 6 pm with slurred speech and right sided weakness, confusion, cleared up within one hour 4/18 - more alert and sitting in chair, follows commands  4/19 - sleeping this AM, received ativan earlier in the am, cath  4/20 - more alert this AM  Assessment & Plan:   Bilateral, infra and supra-tentorial infarcts, c/w cardioembolic infarcts secondary to either atrial fibrillation vs acute MI - Resultant L facial, L arm and leg weakness, improving overall  - MRIAcute bilateral infra- and supratentorial small infarcts - Carotid Doppler and ECHO with normal EF [id number], normal systolic function, grade I diastolic CHF - currently on IV Heparin and will continue per cardiology, plan for cath today - PT eval done, recommends HH PT, OT eval done, recommends SNF - SLP also done, pt passed eval and regular thin diet recommended  - spoke  with Dr. Leonie Man, stroke specialist, no further recommendations from their standpoint  - still awaiting for cardio team to decide on cath   Elevated tropoinin Acute coronary syndrome vs atrial fibrillation, ? NSTEMI, demand ischemia  - Troponin 0.05 on arrival, up to 7.77 --> 19 --> 32 --> 35 --> 39 --> 27 --> 23 --> 12 --> 6 - cardiology following, appreciate assistance  - continue with Heparin drip  - ? cardiac cath per cardio today or am  Confusion overnight 4/17 and 4/19 - seems to be related to Ativan she gets if she is agitated  - zyprexa seems to be helping - would recommend avoiding ativan if possible   Leukocytosis - no signs of PNA on imaging studies and no UTI - discontinued Rocephin 4/17, pt has received total of three days of rocephin  - WBC remains stable and WNL   Hypertension, essential  - reasonably stable this AM  Diabetes, type II, on oral antihyperglycemics - on Actos at home - here on SSI, diet advanced to carb modified and pt tolerating well   Acute kidney injury with metabolic acidosis  - from acute illness, pre renal etiology, demand ischemia - renal US with no acute abnormalities noted  - Cr nicely trending down, BMP pending this AM - BMP in AM  Transaminitis - from acute illness - resolved   Hyperkalemia - resolved   Thrombocytopenia - reactive  - resolved   Anemia of critical illness - no indication of transfusion at this time  - no signs of  bleeding - CBC in AM  DVT prophylaxis: On Heparin drip  Code Status: Full  Family Communication: Multiple family members over the phone  Disposition Plan: when cleared by cardiology team   Consultants:   Cardiology  Neurology   [name]  SW  PT/OT/SLP  Procedures:   None   Antimicrobials:   Rocephin 4/15 --> 4/17  Subjective: Alert, NAD, no chest pain or shortness of breath.  Objective: Filed Vitals:   01/03/16 0000 01/03/16 0200 01/03/16 0400 01/03/16 0500  BP: 148/77 138/74  135/82   Pulse: 78 78 79   Temp: 97.9 F (36.6 C)  98.1 F (36.7 C)   TempSrc: Oral  Axillary   Resp: 23 27 26    Height:      Weight:    69.2 kg (152 lb 8.9 oz)  SpO2: 95% 99% 98%     Intake/Output Summary (Last 24 hours) at 01/03/16 0846 Last data filed at 01/03/16 0700  Gross per 24 hour  Intake  560.5 ml  Output      0 ml  Net  560.5 ml   Filed Weights   01/01/16 0434 01/02/16 0500 01/03/16 0500  Weight: 68.3 kg (150 lb 9.2 oz) 70.4 kg (155 lb 3.3 oz) 69.2 kg (152 lb 8.9 oz)    Examination:  General exam: alert and awake, follows commands, NAD Respiratory system: Diminished breath sounds at bases, scattered rhonchi at bases  Cardiovascular system: S1 & S2 heard, RRR. No JVD, murmurs Gastrointestinal system: Abdomen is nondistended, soft and nontender.  Central nervous system: alert, follows commands, moving all 4 extremities against gravity   Data Reviewed: I have personally reviewed following labs and imaging studies  CBC:  Recent Labs Lab 12/27/15 2131  12/30/15 0115 12/31/15 0615 01/01/16 0301 01/02/16 0501 01/03/16 0347  WBC 13.4*  < > 10.3 6.2 7.3 8.6 8.3  NEUTROABS 10.8*  --   --   --   --   --   --   HGB 10.7*  < > 10.2* 9.3* 9.3* 9.4* 9.2*  HCT 33.0*  < > 32.2* 28.0* 28.8* 29.6* 29.1*  MCV 85.9  < > 86.8 86.4 85.5 85.8 87.1  PLT 137*  < > 145* 143* 160 165 186  < > = values in this interval not displayed. Basic Metabolic Panel:  Recent Labs Lab 12/29/15 0400 12/30/15 0115 12/31/15 0615 01/01/16 0301 01/02/16 0501  NA 145 138 140 140 142  K 5.0 4.5 4.1 4.3 4.4  CL 115* 112* 114* 111 115*  CO2 20* 19* 19* 19* 21*  GLUCOSE 109* 150* 120* 114* 215*  BUN 51* 47* 32* 27* 19  CREATININE 2.00* 1.68* 1.54* 1.52* 1.45*  CALCIUM 8.6* 8.4* 8.3* 8.5* 9.0   Liver Function Tests:  Recent Labs Lab 12/27/15 2131 12/30/15 0115 12/31/15 0615 01/01/16 0301 01/02/16 0501  AST 98* 103* 56* 37 28  ALT 35 103* 80* 66* 51  ALKPHOS 63 55 55 57 58    BILITOT 0.8 0.5 0.5 0.6 0.4  PROT 6.1* 5.3* 5.0* 5.2* 5.6*  ALBUMIN 3.4* 2.8* 2.7* 2.8* 2.8*   Cardiac Enzymes:  Recent Labs Lab 12/30/15 1155 12/30/15 1752 12/30/15 2320 12/31/15 0615 12/31/15 1151  TROPONINI 9.61* 9.71* 9.47* 7.88* 6.43*   CBG:  Recent Labs Lab 01/01/16 2202 01/02/16 0813 01/02/16 1222 01/02/16 1710 01/02/16 2150  GLUCAP 183* 185* 127* 97 102*   Urine analysis:    Component Value Date/Time   COLORURINE YELLOW 01/01/2016 [facility] 01/01/2016 1618  LABSPEC 1.008 01/01/2016 1618   PHURINE 5.0 01/01/2016 1618   GLUCOSEU NEGATIVE 01/01/2016 1618   HGBUR NEGATIVE 01/01/2016 1618   BILIRUBINUR NEGATIVE 01/01/2016 1618   KETONESUR NEGATIVE 01/01/2016 1618   PROTEINUR NEGATIVE 01/01/2016 1618   NITRITE NEGATIVE 01/01/2016 1618   LEUKOCYTESUR NEGATIVE 01/01/2016 1618   Recent Results (from the past 240 hour(s))  Urine culture     Status: None   Collection Time: 12/26/15 10:25 PM  Result Value Ref Range Status   Specimen Description URINE, RANDOM  Final   Special Requests Normal  Final   Culture NO GROWTH 1 DAY  Final   Report Status 12/28/2015 FINAL  Final  MRSA PCR Screening     Status: None   Collection Time: 12/27/15  9:07 PM  Result Value Ref Range Status   MRSA by PCR NEGATIVE NEGATIVE Final  Urine culture     Status: None   Collection Time: 12/29/15  7:47 PM  Result Value Ref Range Status   Specimen Description URINE, CATHETERIZED  Final   Special Requests NONE  Final   Culture NO GROWTH 2 DAYS  Final   Report Status 12/31/2015 FINAL  Final    Radiology Studies: Ct Abdomen Pelvis W Contrast 12/27/2015   Small bilateral effusions Sigmoid diverticulosis. Negative for diverticulitis.   US Renal 12/28/2015  No hydronephrosis. Mild renal atrophy.   Scheduled Meds: . acetaminophen  650 mg Oral QID  . antiseptic oral rinse  7 mL Mouth Rinse BID  . aspirin  300 mg Rectal Daily   Or  . aspirin  325 mg Oral Daily  .  atorvastatin  10 mg Oral Daily  . bisacodyl  5 mg Oral Daily  . cholestyramine light  4 g Oral BID  . citalopram  20 mg Oral Daily  . Gerhardt's butt cream   Topical TID  . insulin aspart  0-9 Units Subcutaneous TID WC  . mirtazapine  15 mg Oral QHS  . OLANZapine zydis  2.5 mg Oral QHS   Continuous Infusions: . heparin 1,400 Units/hr (01/02/16 1828)    LOS: 7 days   Time spent: 20 minutes   Faye Ramsay, MD [facility] Pager [phone]  If 7PM-7AM, please contact night-coverage [email or web address] Password [facility] 01/03/2016, 8:46 AM

## 2016-01-03 NOTE — Progress Notes (Signed)
ANTICOAGULATION CONSULT NOTE - Follow Up Consult  Pharmacy Consult for Heparin Indication: chest pain/ACS and stroke  Allergies  Allergen Reactions  . Codeine Nausea Only    Patient Measurements: Height: 5\' 5"  (165.1 cm) Weight: 152 lb 8.9 oz (69.2 kg) IBW/kg (Calculated) : 57 Heparin Dosing Weight:   Vital Signs: Temp: 98.3 F (36.8 C) (04/20 1600) Temp Source: Oral (04/20 1600) BP: 146/94 mmHg (04/20 1600)  Labs:  Recent Labs  01/01/16 0301  01/02/16 0501  01/02/16 2030 01/03/16 0347 01/03/16 0907 01/03/16 1842  HGB 9.3*  --  9.4*  --   --  9.2*  --   --   HCT 28.8*  --  29.6*  --   --  29.1*  --   --   PLT 160  --  165  --   --  186  --   --   HEPARINUNFRC  --   < >  --   < > 0.48 0.64  --  0.57  CREATININE 1.52*  --  1.45*  --   --   --  1.26*  --   < > = values in this interval not displayed.  Estimated Creatinine Clearance: 33.1 mL/min (by C-G formula based on Cr of 1.26).   Medications:  Scheduled:  . acetaminophen  650 mg Oral QID  . antiseptic oral rinse  7 mL Mouth Rinse BID  . [START ON 01/04/2016] aspirin  81 mg Oral Pre-Cath  . [START ON 01/04/2016] aspirin  81 mg Oral Pre-Cath  . aspirin  300 mg Rectal Daily   Or  . aspirin  325 mg Oral Daily  . atorvastatin  10 mg Oral Daily  . bisacodyl  5 mg Oral Daily  . cholestyramine light  4 g Oral BID  . citalopram  20 mg Oral Daily  . feeding supplement (GLUCERNA SHAKE)  237 mL Oral TID BM  . Gerhardt's butt cream   Topical TID  . insulin aspart  0-9 Units Subcutaneous TID WC  . mirtazapine  15 mg Oral QHS  . OLANZapine zydis  2.5 mg Oral QHS  . sodium chloride flush  3 mL Intravenous Q12H  . sodium chloride flush  3 mL Intravenous Q12H    Assessment: 82yo female with NSTEMI and acute stroke.   Heparin level still a little above goal    Goal of Therapy:  Heparin level 0.3-0.5 units/ml Monitor platelets by anticoagulation protocol: Yes  Plan:  Decrease Heparin to 1150 units/hr Follow up  am labs  Thank you Anette Guarneri, PharmD [phone]

## 2016-01-03 NOTE — Progress Notes (Addendum)
Nutrition Follow-up  DOCUMENTATION CODES:   Not applicable  INTERVENTION:    Re-order Glucerna Shake po TID, each supplement provides 220 kcal and 10 grams of protein  NUTRITION DIAGNOSIS:   Inadequate oral intake now related to poor appetite as evidenced by meal completion < 25%, ongoing  GOAL:   Patient will meet greater than or equal to 90% of their needs, unmet  MONITOR:   PO intake, Supplement acceptance, Labs, Weight trends, I & O's  ASSESSMENT:   82 y.o. female with a history of type 2 diabetes mellitus, colon cancer, hypertension, hyperlipidemia, macular degeneration with blindness and an equivocal history of cognitive impairment, brought to the emergency room and code stroke status with acute confusion and agitation.  Pt not very conversant with RD; somewhat confused. PO intake very poor at 0-5% per flowsheet records. Noted order placed 4/18 to assist pt with meals. Possible cardiac cath per Cardiology today or tomorrow AM. RD to re-order Glucerna Shake oral nutrition supplements.  Diet Order:  Diet regular Room service appropriate?: Yes; Fluid consistency:: Thin  Skin:  Reviewed, no issues  Last BM:  4/17  Height:   Ht Readings from Last 1 Encounters:  12/29/15 5\' 5"  (1.651 m)    Weight:   Wt Readings from Last 1 Encounters:  01/03/16 152 lb 8.9 oz (69.2 kg)    Ideal Body Weight:  50 kg  BMI:  Body mass index is 25.39 kg/(m^2).  Estimated Nutritional Needs:   Kcal:  1450-1650  Protein:  75-85 grams  Fluid:  1.6 L/day  EDUCATION NEEDS:   No education needs identified at this time  Arthur Holms, RD, LDN Pager #: [phone] After-Hours Pager #: [phone]

## 2016-01-03 NOTE — Progress Notes (Signed)
Patient would like to discharge to:  [facility] or [facility].  Percell Locus Shaunee Mulkern LCSWA [phone]

## 2016-01-03 NOTE — Clinical Social Work Note (Signed)
Clinical Social Work Assessment  Patient Details  Name: Laura Parks MRN: [id number] Date of Birth: 1930/07/18  Date of referral:  01/03/16               Reason for consult:  Facility Placement                Permission sought to share information with:  Facility Sport and exercise psychologist, Family Supports, Case Manager Permission granted to share information::  Yes, Verbal Permission Granted  Name::      [name] [place])  Agency::     Relationship::   (daughter)  Contact Information:   [phone])  Housing/Transportation Living arrangements for the past 2 months:  Single Family Home Source of Information:  Patient Patient Interpreter Needed:  None Criminal Activity/Legal Involvement Pertinent to Current Situation/Hospitalization:  No - Comment as needed Significant Relationships:  Adult Children, Spouse Lives with:  Adult Children, Spouse Do you feel safe going back to the place where you live?    Need for family participation in patient care:  Yes (Comment)  Care giving concerns:  Patient says she might have had a heart attack at home. PT recommend SNF   Social Worker assessment / plan:  BSW intern enters patient room. Patient is alert and oriented sitting in chair watching TV. BSW intern explained PT recommendation which is (SNF) and referral process. Patient did not refuse SNF but was unsure at this time and wanted to talk to her daughter and husband and get back with CSW. Patient states she does thinks she needs rehab. BSW intern said she will still fax out her information to facilities just in case she changes her mind. Patient said that would be fine.  Patient states she does live with her husband and daughter. Patients states she does use a walker at home if needed.   Employment status:  Retired Forensic scientist:  Medicare PT Recommendations:  [place] / Referral to community resources:  Acute Rehab  Patient/Family's Response to care:   Patient has a good response to care although she uncertain of what she wants to do when medically ready at discharge.  Patient/Family's Understanding of and Emotional Response to Diagnosis, Current Treatment, and Prognosis:  Patient has a good understanding of current treatment.   Emotional Assessment Appearance:  Appears stated age Attitude/Demeanor/Rapport:   (welcoming, friendly) Affect (typically observed):  Calm, Pleasant Orientation:  Oriented to Self, Oriented to Place, Oriented to  Time, Oriented to Situation Alcohol / Substance use:  Never Used Psych involvement (Current and /or in the community):  No (Comment)  Discharge Needs  Concerns to be addressed:  No discharge needs identified Readmission within the last 30 days:  No Current discharge risk:  None Barriers to Discharge:  No Barriers Identified   [place] intern  [phone]

## 2016-01-04 ENCOUNTER — Encounter (HOSPITAL_COMMUNITY)
Admission: EM | Disposition: A | Discharge status: Home Health | Payer: Self-pay | Source: Home / Self Care | Attending: Internal Medicine

## 2016-01-04 DIAGNOSIS — I63139 Cerebral infarction due to embolism of unspecified carotid artery: Secondary | ICD-10-CM

## 2016-01-04 DIAGNOSIS — I639 Cerebral infarction, unspecified: Secondary | ICD-10-CM | POA: Insufficient documentation

## 2016-01-04 HISTORY — PX: CARDIAC CATHETERIZATION: SHX172

## 2016-01-04 LAB — CBC
HCT: 28.2 % — ABNORMAL LOW (ref 36.0–46.0)
Hemoglobin: 9.2 g/dL — ABNORMAL LOW (ref 12.0–15.0)
MCH: 28.5 pg (ref 26.0–34.0)
MCHC: 32.6 g/dL (ref 30.0–36.0)
MCV: 87.3 fL (ref 78.0–100.0)
PLATELETS: 185 10*3/uL (ref 150–400)
RBC: 3.23 MIL/uL — ABNORMAL LOW (ref 3.87–5.11)
RDW: 15.6 % — AB (ref 11.5–15.5)
WBC: 7.9 10*3/uL (ref 4.0–10.5)

## 2016-01-04 LAB — BASIC METABOLIC PANEL
ANION GAP: 8 (ref 5–15)
BUN: 20 mg/dL (ref 6–20)
CALCIUM: 8.8 mg/dL — AB (ref 8.9–10.3)
CO2: 19 mmol/L — AB (ref 22–32)
CREATININE: 1.3 mg/dL — AB (ref 0.44–1.00)
Chloride: 115 mmol/L — ABNORMAL HIGH (ref 101–111)
GFR calc Af Amer: 43 mL/min — ABNORMAL LOW (ref >60)
GFR calc non Af Amer: 37 mL/min — ABNORMAL LOW (ref >60)
GLUCOSE: 156 mg/dL — AB (ref 65–99)
Potassium: 4.7 mmol/L (ref 3.5–5.1)
Sodium: 142 mmol/L (ref 135–145)

## 2016-01-04 LAB — GLUCOSE, CAPILLARY
GLUCOSE-CAPILLARY: 115 mg/dL — AB (ref 65–99)
GLUCOSE-CAPILLARY: 101 mg/dL — AB (ref 65–99)

## 2016-01-04 LAB — HEPARIN LEVEL (UNFRACTIONATED): Heparin Unfractionated: 0.4 IU/mL (ref 0.30–0.70)

## 2016-01-04 SURGERY — LEFT HEART CATH AND CORONARY ANGIOGRAPHY
Anesthesia: LOCAL

## 2016-01-04 MED ORDER — IOPAMIDOL (ISOVUE-370) INJECTION 76%
INTRAVENOUS | Status: AC
  Filled 2016-01-04: qty 100

## 2016-01-04 MED ORDER — VERAPAMIL HCL 2.5 MG/ML IV SOLN
INTRAVENOUS | Status: AC
  Filled 2016-01-04: qty 2

## 2016-01-04 MED ORDER — SODIUM CHLORIDE 0.9 % WEIGHT BASED INFUSION: 1.0000 mL/kg/h | INTRAVENOUS | Status: AC

## 2016-01-04 MED ORDER — SODIUM CHLORIDE 0.9% FLUSH
3.0000 mL | Freq: Two times a day (BID) | INTRAVENOUS | Status: DC
  Administered 2016-01-04 – 2016-01-06 (×4): 3 mL via INTRAVENOUS

## 2016-01-04 MED ORDER — MIDAZOLAM HCL 2 MG/2ML IJ SOLN
INTRAMUSCULAR | Status: AC
  Filled 2016-01-04: qty 2

## 2016-01-04 MED ORDER — MIDAZOLAM HCL 2 MG/2ML IJ SOLN
INTRAMUSCULAR | Status: DC | PRN
  Administered 2016-01-04: 0.5 mg via INTRAVENOUS

## 2016-01-04 MED ORDER — IOPAMIDOL (ISOVUE-370) INJECTION 76%
INTRAVENOUS | Status: DC | PRN
  Administered 2016-01-04: 75 mL via INTRAVENOUS

## 2016-01-04 MED ORDER — HEPARIN (PORCINE) IN NACL 2-0.9 UNIT/ML-% IJ SOLN
INTRAMUSCULAR | Status: AC
  Filled 2016-01-04: qty 1000

## 2016-01-04 MED ORDER — HEPARIN SODIUM (PORCINE) 1000 UNIT/ML IJ SOLN
INTRAMUSCULAR | Status: AC
  Filled 2016-01-04: qty 1

## 2016-01-04 MED ORDER — LIDOCAINE HCL (PF) 1 % IJ SOLN
INTRAMUSCULAR | Status: DC | PRN
  Administered 2016-01-04: 2 mL

## 2016-01-04 MED ORDER — SODIUM CHLORIDE 0.9% FLUSH: 3.0000 mL | INTRAVENOUS | Status: DC | PRN

## 2016-01-04 MED ORDER — LIDOCAINE HCL (PF) 1 % IJ SOLN
INTRAMUSCULAR | Status: AC
  Filled 2016-01-04: qty 30

## 2016-01-04 MED ORDER — SODIUM CHLORIDE 0.9 % IV SOLN: 250.0000 mL | INTRAVENOUS | Status: DC | PRN

## 2016-01-04 MED ORDER — HEPARIN (PORCINE) IN NACL 2-0.9 UNIT/ML-% IJ SOLN
INTRAMUSCULAR | Status: DC | PRN
  Administered 2016-01-04: 1000 mL

## 2016-01-04 MED ORDER — HEPARIN SODIUM (PORCINE) 1000 UNIT/ML IJ SOLN
INTRAMUSCULAR | Status: DC | PRN
  Administered 2016-01-04: 3500 [IU] via INTRAVENOUS

## 2016-01-04 MED ORDER — VERAPAMIL HCL 2.5 MG/ML IV SOLN
INTRAVENOUS | Status: DC | PRN
  Administered 2016-01-04: 10 mL via INTRA_ARTERIAL

## 2016-01-04 SURGICAL SUPPLY — 10 items
CATH INFINITI 5 FR JL3.5 (CATHETERS) ×1 IMPLANT
CATH INFINITI JR4 5F (CATHETERS) ×1 IMPLANT
DEVICE RAD COMP TR BAND LRG (VASCULAR PRODUCTS) ×1 IMPLANT
GLIDESHEATH SLEND A-KIT 6F 22G (SHEATH) ×1 IMPLANT
KIT HEART LEFT (KITS) ×2 IMPLANT
PACK CARDIAC CATHETERIZATION (CUSTOM PROCEDURE TRAY) ×2 IMPLANT
TRANSDUCER W/STOPCOCK (MISCELLANEOUS) ×2 IMPLANT
TUBING CIL FLEX 10 FLL-RA (TUBING) ×2 IMPLANT
WIRE HI TORQ VERSACORE-J 145CM (WIRE) ×1 IMPLANT
WIRE SAFE-T 1.5MM-J .035X260CM (WIRE) ×1 IMPLANT

## 2016-01-04 NOTE — Progress Notes (Signed)
TR band---3cc. Level 0

## 2016-01-04 NOTE — Progress Notes (Signed)
Patient transported via bed to Cath Lab. Per Cath Lab- patient will not return to [facility]. Patient belongings were bagged up and labeled with her name for transfer once a telemetry bed becomes available for her. Patient's husband called and notified of her transfer. CCMD notified as well. Milford Cage, RN

## 2016-01-04 NOTE — Progress Notes (Signed)
Rt radial level 0. Rt radial pulse 2+. Report called to [place] on 3W. Transferred to [id number]

## 2016-01-04 NOTE — Progress Notes (Signed)
TR band completely deflated. Level 0. Rt radial 2+

## 2016-01-04 NOTE — Care Management Important Message (Signed)
Important Message  Patient Details  Name: Laura Parks MRN: [id number] Date of Birth: [birth year]   Medicare Important Message Given:  Yes    [name] Abena 01/04/2016, 10:20 AM

## 2016-01-04 NOTE — Progress Notes (Signed)
ANTICOAGULATION CONSULT NOTE - Follow Up Consult  Pharmacy Consult for Heparin Indication: chest pain/ACS and stroke  Allergies  Allergen Reactions  . Codeine Nausea Only    Patient Measurements: Height: 5\' 5"  (165.1 cm) Weight: 149 lb 0.5 oz (67.6 kg) IBW/kg (Calculated) : 57 Heparin Dosing Weight:   Vital Signs: Temp: 97.8 F (36.6 C) (04/21 0800) Temp Source: Oral (04/21 0800) BP: 164/58 mmHg (04/21 0800) Pulse Rate: 61 (04/21 0800)  Labs:  Recent Labs  01/02/16 0501  01/03/16 0347 01/03/16 0907 01/03/16 1842 01/04/16 0450  HGB 9.4*  --  9.2*  --   --  9.2*  HCT 29.6*  --  29.1*  --   --  28.2*  PLT 165  --  186  --   --  185  HEPARINUNFRC  --   < > 0.64  --  0.57 0.40  CREATININE 1.45*  --   --  1.26*  --  1.30*  < > = values in this interval not displayed.  Estimated Creatinine Clearance: 29.5 mL/min (by C-G formula based on Cr of 1.3).   Medications:  Scheduled:  . acetaminophen  650 mg Oral QID  . antiseptic oral rinse  7 mL Mouth Rinse BID  . aspirin  300 mg Rectal Daily   Or  . aspirin  325 mg Oral Daily  . atorvastatin  10 mg Oral Daily  . bisacodyl  5 mg Oral Daily  . cholestyramine light  4 g Oral BID  . citalopram  20 mg Oral Daily  . feeding supplement (GLUCERNA SHAKE)  237 mL Oral TID BM  . Gerhardt's butt cream   Topical TID  . insulin aspart  0-9 Units Subcutaneous TID WC  . mirtazapine  15 mg Oral QHS  . OLANZapine zydis  2.5 mg Oral QHS  . sodium chloride flush  3 mL Intravenous Q12H    Assessment: 82yo female with NSTEMI and acute stroke. H/H stable. Plt wnl. Heparin level this AM is therapeutic on 1150 units/hr. RN reports no s/s of bleeding     Goal of Therapy:  Heparin level 0.3-0.5 units/ml Monitor platelets by anticoagulation protocol: Yes  Plan:  Continue Heparin to 1150 units/hr LHC planned for today  F/u after cath   Albertina Parr, PharmD., BCPS Clinical Pharmacist Pager [phone]

## 2016-01-04 NOTE — Progress Notes (Signed)
TR band---12cc

## 2016-01-04 NOTE — Interval H&P Note (Signed)
History and Physical Interval Note:  01/04/2016 12:51 PM  Laura Parks  has presented today for surgery, with the diagnosis of nstemi  The various methods of treatment have been discussed with the patient and family. After consideration of risks, benefits and other options for treatment, the patient has consented to  Procedure(s): Left Heart Cath and Coronary Angiography (N/A) as a surgical intervention .  The patient's history has been reviewed, patient examined, no change in status, stable for surgery.  I have reviewed the patient's chart and labs.  Questions were answered to the patient's satisfaction.    Cath Lab Visit (complete for each Cath Lab visit)  Clinical Evaluation Leading to the Procedure:   ACS: Yes.    Non-ACS:    Anginal Classification: No Symptoms  Anti-ischemic medical therapy: Minimal Therapy (1 class of medications)  Non-Invasive Test Results: No non-invasive testing performed  Prior CABG: No previous CABG   TIMI Score  Patient Information:  TIMI Score is 4   UA/NSTEMI and intermediate-risk features (e.g., TIMI score 3-4) for short-term risk of death or nonfatal MI  Revascularization of the presumed culprit artery   A (8)  Indication: 10; Score: 8    Laura Parks

## 2016-01-04 NOTE — H&P (View-Only) (Signed)
PATIENT ID: [id number] with diabetes mellitus, colon cancer, hypertension, hyperlipidemia and macular degeneration with blindness here with acute confusion noted to have watershed infarcts on MRI consistent with cardioembolic source or hypotension.  Also with NSTEMI with troponin peak of 38.    SUBJECTIVE:  Felling well. Denies chest pain or shortness of breath.   PHYSICAL EXAM Filed Vitals:   01/04/16 0200 01/04/16 0403 01/04/16 0405 01/04/16 0600  BP: 147/69 99/53  138/79  Pulse:  78    Temp:  97.4 F (36.3 C)    TempSrc:  Axillary    Resp: 19 28  26   Height:      Weight:   67.6 kg (149 lb 0.5 oz)   SpO2:  100%     General:  Well-appearing.  No acute distress.  Neck: No JVD Lungs:  CTAB.  No crackles, rhonchi or wheezes Heart:  RRR.  No m/r/g.  Normal S1/S2 Abdomen:  Soft, NT, ND.  +BS  Extremities:  WWP.  No edema.   LABS: Lab Results  Component Value Date   TROPONINI 6.43* 12/31/2015   Results for orders placed or performed during the hospital encounter of 12/26/15 (from the past 24 hour(s))  Glucose, capillary     Status: None   Collection Time: 01/03/16  8:40 AM  Result Value Ref Range   Glucose-Capillary 86 65 - 99 mg/dL  Basic metabolic panel     Status: Abnormal   Collection Time: 01/03/16  9:07 AM  Result Value Ref Range   Sodium 143 135 - 145 mmol/L   Potassium 4.5 3.5 - 5.1 mmol/L   Chloride 114 (H) 101 - 111 mmol/L   CO2 20 (L) 22 - 32 mmol/L   Glucose, Bld 118 (H) 65 - 99 mg/dL   BUN 16 6 - 20 mg/dL   Creatinine, Ser 1.26 (H) 0.44 - 1.00 mg/dL   Calcium 9.2 8.9 - 10.3 mg/dL   GFR calc non Af Amer 38 (L) >60 mL/min   GFR calc Af Amer 44 (L) >60 mL/min   Anion gap 9 5 - 15  Glucose, capillary     Status: Abnormal   Collection Time: 01/03/16 12:28 PM  Result Value Ref Range   Glucose-Capillary 112 (H) 65 - 99 mg/dL  Glucose, capillary     Status: Abnormal   Collection Time: 01/03/16  5:09 PM  Result Value Ref Range   Glucose-Capillary 201 (H) 65 - 99  mg/dL  Heparin level (unfractionated)     Status: None   Collection Time: 01/03/16  6:42 PM  Result Value Ref Range   Heparin Unfractionated 0.57 0.30 - 0.70 IU/mL  Glucose, capillary     Status: Abnormal   Collection Time: 01/03/16  9:35 PM  Result Value Ref Range   Glucose-Capillary 168 (H) 65 - 99 mg/dL  CBC     Status: Abnormal   Collection Time: 01/04/16  4:50 AM  Result Value Ref Range   WBC 7.9 4.0 - 10.5 K/uL   RBC 3.23 (L) 3.87 - 5.11 MIL/uL   Hemoglobin 9.2 (L) 12.0 - 15.0 g/dL   HCT 28.2 (L) 36.0 - 46.0 %   MCV 87.3 78.0 - 100.0 fL   MCH 28.5 26.0 - 34.0 pg   MCHC 32.6 30.0 - 36.0 g/dL   RDW 15.6 (H) 11.5 - 15.5 %   Platelets 185 150 - 400 K/uL  Heparin level (unfractionated)     Status: None   Collection Time: 01/04/16  4:50 AM  Result  Value Ref Range   Heparin Unfractionated 0.40 0.30 - 0.70 IU/mL  Basic metabolic panel     Status: Abnormal   Collection Time: 01/04/16  4:50 AM  Result Value Ref Range   Sodium 142 135 - 145 mmol/L   Potassium 4.7 3.5 - 5.1 mmol/L   Chloride 115 (H) 101 - 111 mmol/L   CO2 19 (L) 22 - 32 mmol/L   Glucose, Bld 156 (H) 65 - 99 mg/dL   BUN 20 6 - 20 mg/dL   Creatinine, Ser 1.30 (H) 0.44 - 1.00 mg/dL   Calcium 8.8 (L) 8.9 - 10.3 mg/dL   GFR calc non Af Amer 37 (L) >60 mL/min   GFR calc Af Amer 43 (L) >60 mL/min   Anion gap 8 5 - 15    Intake/Output Summary (Last 24 hours) at 01/04/16 0818 Last data filed at 01/04/16 0700  Gross per 24 hour  Intake 1007.02 ml  Output   1700 ml  Net -692.98 ml    Telemetry: Sinus rhythm.  Frequent, multiform PVCs.   ASSESSMENT AND PLAN:  Principal Problem:   Stroke [facility]) Active Problems:   Essential hypertension, benign   Type 2 diabetes mellitus (HCC)   Hyperkalemia   CHF (congestive heart failure) (HCC)   Acute encephalopathy   TIA (transient ischemic attack)   AKI (acute kidney injury) ([place])   Dehydration   Legal blindness   Palliative care encounter   Acute renal failure  (HCC)   Altered mental status   NSTEMI (non-ST elevated myocardial infarction) ([place])   PAF (paroxysmal atrial fibrillation) ([place])   Acute delirium   Vascular dementia   # NSTEMI: Ms. Pirrello continues to do well today.  She is alert and oriented to self which is her baseline.Given her improvement in mental status we will plan for left heart cath today.  Continue aspirin, atorvastatin, and heparin.  We will stop heparin after cath whether or not there is an intervention.  She does have macular degeneration. After discussing with her daughter she has not had any recent falls and is very careful with walking.  Consider BMS, but she should be OK for DES if necessary.  # Acute stroke: Cardioembolic vs watershed 2/2 hypotension.  There are no plans for TEE or loop recorder, as she is not thought to be a good candidate for long-term anticoagulation.   Jaylea Plourde C. Oval Linsey, MD, [facility] 01/04/2016 8:18 AM

## 2016-01-04 NOTE — Progress Notes (Signed)
TR band removed. Dressing applied. Site level zero.

## 2016-01-04 NOTE — Progress Notes (Signed)
TR band---9cc

## 2016-01-04 NOTE — Progress Notes (Addendum)
TR band---6cc. Level 0. Eating lunch. Family in to see.

## 2016-01-04 NOTE — Progress Notes (Signed)
Patient ID: Laura Parks, female   DOB: 1930/05/18, 82 y.o.   MRN: [id number]    PROGRESS NOTE    Laura Parks  [id number] DOB: 1930-07-15 DOA: 12/26/2015  PCP: Glenda Chroman, MD   Outpatient Specialists: Dr. Laural Golden - GI, Dr. Stanford Breed - ortho   Brief Narrative:   82 y.o. female with a history of type 2 diabetes mellitus, colon cancer, hypertension, hyperlipidemia, macular degeneration with blindness and an equivocal history of cognitive impairment, brought to the emergency room for evaluation of confusion and slurred speech that was noted several hours prior to this admission. She was unable to provide any history at the time of the admission and while in ED she has vomited multiple times. CT scan of her head showed no acute intracranial abnormality. She has remained lethargic throughout the ED evaluation. Code stroke was subsequently cancelled, as acute aphasia was not clear and patient had no clear focal motor deficits.  Events since admission:   slurred speech and right sided weakness, confusion, cleared up within one hour    Assessment & Plan:   Bilateral, infra and supra-tentorial infarcts, c/w cardioembolic infarcts secondary to either atrial fibrillation vs acute MI - Resultant L facial, L arm and leg weakness, improving overall  - MRIAcute bilateral infra- and supratentorial small infarcts - Carotid Doppler and ECHO with normal EF [id number], normal systolic function, grade I diastolic CHF - currently on IV Heparin and will continue per cardiology, plan for cath today - PT eval done, recommends HH PT, OT eval done, recommends SNF - SLP also done, pt passed eval and regular thin diet recommended  - spoke with Dr. Leonie Man, stroke specialist, no further recommendations from their standpoint  plan for left heart cath today , as per cardiology There are no plans for TEE or loop recorder, as she is not thought to be a good candidate for long-term anticoagulation.   Acute coronary  syndrome vs atrial fibrillation, ? NSTEMI, demand ischemia  - Troponin 0.05 on arrival, up to 7.77 --> 19 --> 32 --> 35 --> 39 --> 27 --> 23 --> 12 --> 6 - cardiology following, Continue aspirin, atorvastatin, and heparin. Cardiology recommends to stop heparin after cath whether or not there is an intervention. Consider bare metal stent preferably    Acute encephalopathy, likely toxic/medication-induced - seems to be related to Ativan she gets if she is agitated  - zyprexa seems to be helping - would recommend avoiding ativan if possible   Leukocytosis - no signs of PNA on imaging studies and no UTI - discontinued Rocephin 4/17, pt has received total of three days of rocephin  - WBC remains stable and WNL   Hypertension, essential  - reasonably stable this AM  Diabetes, type II, on oral antihyperglycemics - on Actos at home - here on SSI, diet advanced to carb modified and pt tolerating well   Acute kidney injury with metabolic acidosis , [id number] - from acute illness, pre renal etiology, demand ischemia - renal US with no acute abnormalities noted  - Cr nicely trending down,      Transaminitis - from acute illness - resolved   Hyperkalemia - resolved   Thrombocytopenia - reactive  - resolved   Anemia of critical illness - no indication of transfusion at this time  - no signs of bleeding - CBC in AM  DVT prophylaxis: On Heparin drip  Code Status: Full  Family Communication: Multiple family members over the phone  Disposition Plan: when  cleared by cardiology team   Consultants:   Cardiology  Neurology   [name]  SW  PT/OT/SLP  Procedures:   None   Antimicrobials:   Rocephin 4/15 --> 4/17  Subjective: Alert, NAD, resting comfortably in bed, denies any chest pain or shortness of breath  Objective: Filed Vitals:   01/04/16 0403 01/04/16 0405 01/04/16 0600 01/04/16 0800  BP: 99/53  138/79 164/58  Pulse: 78   61  Temp: 97.4 F (36.3 C)   97.8 F  (36.6 C)  TempSrc: Axillary   Oral  Resp: 28  26 29   Height:      Weight:  67.6 kg (149 lb 0.5 oz)    SpO2: 100%   99%    Intake/Output Summary (Last 24 hours) at 01/04/16 0832 Last data filed at 01/04/16 0700  Gross per 24 hour  Intake 1007.02 ml  Output   1700 ml  Net -692.98 ml   Filed Weights   01/02/16 0500 01/03/16 0500 01/04/16 0405  Weight: 70.4 kg (155 lb 3.3 oz) 69.2 kg (152 lb 8.9 oz) 67.6 kg (149 lb 0.5 oz)    Examination:  General exam: alert and awake, follows commands, NAD Respiratory system: Diminished breath sounds at bases, scattered rhonchi at bases  Cardiovascular system: S1 & S2 heard, RRR. No JVD, murmurs Gastrointestinal system: Abdomen is nondistended, soft and nontender.  Central nervous system: alert, follows commands, moving all 4 extremities against gravity   Data Reviewed: I have personally reviewed following labs and imaging studies  CBC:  Recent Labs Lab 12/31/15 0615 01/01/16 0301 01/02/16 0501 01/03/16 0347 01/04/16 0450  WBC 6.2 7.3 8.6 8.3 7.9  HGB 9.3* 9.3* 9.4* 9.2* 9.2*  HCT 28.0* 28.8* 29.6* 29.1* 28.2*  MCV 86.4 85.5 85.8 87.1 87.3  PLT 143* 160 165 186 [id number]   Basic Metabolic Panel:  Recent Labs Lab 12/31/15 0615 01/01/16 0301 01/02/16 0501 01/03/16 0907 01/04/16 0450  NA 140 140 142 143 142  K 4.1 4.3 4.4 4.5 4.7  CL 114* 111 115* 114* 115*  CO2 19* 19* 21* 20* 19*  GLUCOSE 120* 114* 215* 118* 156*  BUN 32* 27* 19 16 20   CREATININE 1.54* 1.52* 1.45* 1.26* 1.30*  CALCIUM 8.3* 8.5* 9.0 9.2 8.8*   Liver Function Tests:  Recent Labs Lab 12/30/15 0115 12/31/15 0615 01/01/16 0301 01/02/16 0501  AST 103* 56* 37 28  ALT 103* 80* 66* 51  ALKPHOS 55 55 57 58  BILITOT 0.5 0.5 0.6 0.4  PROT 5.3* 5.0* 5.2* 5.6*  ALBUMIN 2.8* 2.7* 2.8* 2.8*   Cardiac Enzymes:  Recent Labs Lab 12/30/15 1155 12/30/15 1752 12/30/15 2320 12/31/15 0615 12/31/15 1151  TROPONINI 9.61* 9.71* 9.47* 7.88* 6.43*   CBG:  Recent  Labs Lab 01/02/16 2150 01/03/16 0840 01/03/16 1228 01/03/16 1709 01/03/16 2135  GLUCAP 102* 86 112* 201* 168*   Urine analysis:    Component Value Date/Time   COLORURINE YELLOW 01/01/2016 [place] 01/01/2016 1618   LABSPEC 1.008 01/01/2016 1618   PHURINE 5.0 01/01/2016 [place] 01/01/2016 [place] 01/01/2016 1618   BILIRUBINUR NEGATIVE 01/01/2016 1618   KETONESUR NEGATIVE 01/01/2016 1618   PROTEINUR NEGATIVE 01/01/2016 1618   NITRITE NEGATIVE 01/01/2016 1618   LEUKOCYTESUR NEGATIVE 01/01/2016 1618   Recent Results (from the past 240 hour(s))  Urine culture     Status: None   Collection Time: 12/26/15 10:25 PM  Result Value Ref Range Status   Specimen Description  URINE, RANDOM  Final   Special Requests Normal  Final   Culture NO GROWTH 1 DAY  Final   Report Status 12/28/2015 FINAL  Final  MRSA PCR Screening     Status: None   Collection Time: 12/27/15  9:07 PM  Result Value Ref Range Status   MRSA by PCR NEGATIVE NEGATIVE Final  Urine culture     Status: None   Collection Time: 12/29/15  7:47 PM  Result Value Ref Range Status   Specimen Description URINE, CATHETERIZED  Final   Special Requests NONE  Final   Culture NO GROWTH 2 DAYS  Final   Report Status 12/31/2015 FINAL  Final    Radiology Studies: Ct Abdomen Pelvis W Contrast 12/27/2015   Small bilateral effusions Sigmoid diverticulosis. Negative for diverticulitis.   US Renal 12/28/2015  No hydronephrosis. Mild renal atrophy.   Scheduled Meds: . acetaminophen  650 mg Oral QID  . antiseptic oral rinse  7 mL Mouth Rinse BID  . aspirin  81 mg Oral Pre-Cath  . aspirin  300 mg Rectal Daily   Or  . aspirin  325 mg Oral Daily  . atorvastatin  10 mg Oral Daily  . bisacodyl  5 mg Oral Daily  . cholestyramine light  4 g Oral BID  . citalopram  20 mg Oral Daily  . feeding supplement (GLUCERNA SHAKE)  237 mL Oral TID BM  . Gerhardt's butt cream   Topical TID  . insulin  aspart  0-9 Units Subcutaneous TID WC  . mirtazapine  15 mg Oral QHS  . OLANZapine zydis  2.5 mg Oral QHS  . sodium chloride flush  3 mL Intravenous Q12H   Continuous Infusions: . sodium chloride    . sodium chloride    . heparin 1,150 Units/hr (01/03/16 2012)    LOS: 8 days   Time spent: 51 minutes   Reyne Dumas, MD [facility] Pager [phone]  If 7PM-7AM, please contact night-coverage [email or web address] Password [email or web address] 01/04/2016, 8:32 AM

## 2016-01-04 NOTE — Progress Notes (Signed)
PATIENT ID: [id number] with diabetes mellitus, colon cancer, hypertension, hyperlipidemia and macular degeneration with blindness here with acute confusion noted to have watershed infarcts on MRI consistent with cardioembolic source or hypotension.  Also with NSTEMI with troponin peak of 38.    SUBJECTIVE:  Felling well. Denies chest pain or shortness of breath.   PHYSICAL EXAM Filed Vitals:   01/04/16 0200 01/04/16 0403 01/04/16 0405 01/04/16 0600  BP: 147/69 99/53  138/79  Pulse:  78    Temp:  97.4 F (36.3 C)    TempSrc:  Axillary    Resp: 19 28  26   Height:      Weight:   67.6 kg (149 lb 0.5 oz)   SpO2:  100%     General:  Well-appearing.  No acute distress.  Neck: No JVD Lungs:  CTAB.  No crackles, rhonchi or wheezes Heart:  RRR.  No m/r/g.  Normal S1/S2 Abdomen:  Soft, NT, ND.  +BS  Extremities:  WWP.  No edema.   LABS: Lab Results  Component Value Date   TROPONINI 6.43* 12/31/2015   Results for orders placed or performed during the hospital encounter of 12/26/15 (from the past 24 hour(s))  Glucose, capillary     Status: None   Collection Time: 01/03/16  8:40 AM  Result Value Ref Range   Glucose-Capillary 86 65 - 99 mg/dL  Basic metabolic panel     Status: Abnormal   Collection Time: 01/03/16  9:07 AM  Result Value Ref Range   Sodium 143 135 - 145 mmol/L   Potassium 4.5 3.5 - 5.1 mmol/L   Chloride 114 (H) 101 - 111 mmol/L   CO2 20 (L) 22 - 32 mmol/L   Glucose, Bld 118 (H) 65 - 99 mg/dL   BUN 16 6 - 20 mg/dL   Creatinine, Ser 1.26 (H) 0.44 - 1.00 mg/dL   Calcium 9.2 8.9 - 10.3 mg/dL   GFR calc non Af Amer 38 (L) >60 mL/min   GFR calc Af Amer 44 (L) >60 mL/min   Anion gap 9 5 - 15  Glucose, capillary     Status: Abnormal   Collection Time: 01/03/16 12:28 PM  Result Value Ref Range   Glucose-Capillary 112 (H) 65 - 99 mg/dL  Glucose, capillary     Status: Abnormal   Collection Time: 01/03/16  5:09 PM  Result Value Ref Range   Glucose-Capillary 201 (H) 65 - 99  mg/dL  Heparin level (unfractionated)     Status: None   Collection Time: 01/03/16  6:42 PM  Result Value Ref Range   Heparin Unfractionated 0.57 0.30 - 0.70 IU/mL  Glucose, capillary     Status: Abnormal   Collection Time: 01/03/16  9:35 PM  Result Value Ref Range   Glucose-Capillary 168 (H) 65 - 99 mg/dL  CBC     Status: Abnormal   Collection Time: 01/04/16  4:50 AM  Result Value Ref Range   WBC 7.9 4.0 - 10.5 K/uL   RBC 3.23 (L) 3.87 - 5.11 MIL/uL   Hemoglobin 9.2 (L) 12.0 - 15.0 g/dL   HCT 28.2 (L) 36.0 - 46.0 %   MCV 87.3 78.0 - 100.0 fL   MCH 28.5 26.0 - 34.0 pg   MCHC 32.6 30.0 - 36.0 g/dL   RDW 15.6 (H) 11.5 - 15.5 %   Platelets 185 150 - 400 K/uL  Heparin level (unfractionated)     Status: None   Collection Time: 01/04/16  4:50 AM  Result  Value Ref Range   Heparin Unfractionated 0.40 0.30 - 0.70 IU/mL  Basic metabolic panel     Status: Abnormal   Collection Time: 01/04/16  4:50 AM  Result Value Ref Range   Sodium 142 135 - 145 mmol/L   Potassium 4.7 3.5 - 5.1 mmol/L   Chloride 115 (H) 101 - 111 mmol/L   CO2 19 (L) 22 - 32 mmol/L   Glucose, Bld 156 (H) 65 - 99 mg/dL   BUN 20 6 - 20 mg/dL   Creatinine, Ser 1.30 (H) 0.44 - 1.00 mg/dL   Calcium 8.8 (L) 8.9 - 10.3 mg/dL   GFR calc non Af Amer 37 (L) >60 mL/min   GFR calc Af Amer 43 (L) >60 mL/min   Anion gap 8 5 - 15    Intake/Output Summary (Last 24 hours) at 01/04/16 0818 Last data filed at 01/04/16 0700  Gross per 24 hour  Intake 1007.02 ml  Output   1700 ml  Net -692.98 ml    Telemetry: Sinus rhythm.  Frequent, multiform PVCs.   ASSESSMENT AND PLAN:  Principal Problem:   Stroke [facility]) Active Problems:   Essential hypertension, benign   Type 2 diabetes mellitus (HCC)   Hyperkalemia   CHF (congestive heart failure) (HCC)   Acute encephalopathy   TIA (transient ischemic attack)   AKI (acute kidney injury) ([place])   Dehydration   Legal blindness   Palliative care encounter   Acute renal failure  (HCC)   Altered mental status   NSTEMI (non-ST elevated myocardial infarction) ([place])   PAF (paroxysmal atrial fibrillation) ([place])   Acute delirium   Vascular dementia   # NSTEMI: Ms. Stenson continues to do well today.  She is alert and oriented to self which is her baseline.Given her improvement in mental status we will plan for left heart cath today.  Continue aspirin, atorvastatin, and heparin.  We will stop heparin after cath whether or not there is an intervention.  She does have macular degeneration. After discussing with her daughter she has not had any recent falls and is very careful with walking.  Consider BMS, but she should be OK for DES if necessary.  # Acute stroke: Cardioembolic vs watershed 2/2 hypotension.  There are no plans for TEE or loop recorder, as she is not thought to be a good candidate for long-term anticoagulation.   Kyrian Stage C. Oval Linsey, MD, [facility] 01/04/2016 8:18 AM

## 2016-01-05 DIAGNOSIS — I6300 Cerebral infarction due to thrombosis of unspecified precerebral artery: Secondary | ICD-10-CM

## 2016-01-05 LAB — CBC
HEMATOCRIT: 28.9 % — AB (ref 36.0–46.0)
Hemoglobin: 9.1 g/dL — ABNORMAL LOW (ref 12.0–15.0)
MCH: 27.6 pg (ref 26.0–34.0)
MCHC: 31.5 g/dL (ref 30.0–36.0)
MCV: 87.6 fL (ref 78.0–100.0)
Platelets: 197 10*3/uL (ref 150–400)
RBC: 3.3 MIL/uL — ABNORMAL LOW (ref 3.87–5.11)
RDW: 15.9 % — AB (ref 11.5–15.5)
WBC: 6.3 10*3/uL (ref 4.0–10.5)

## 2016-01-05 LAB — COMPREHENSIVE METABOLIC PANEL
ALBUMIN: 2.6 g/dL — AB (ref 3.5–5.0)
ALT: 27 U/L (ref 14–54)
AST: 16 U/L (ref 15–41)
Alkaline Phosphatase: 55 U/L (ref 38–126)
Anion gap: 8 (ref 5–15)
BUN: 13 mg/dL (ref 6–20)
CHLORIDE: 114 mmol/L — AB (ref 101–111)
CO2: 19 mmol/L — ABNORMAL LOW (ref 22–32)
CREATININE: 1.2 mg/dL — AB (ref 0.44–1.00)
Calcium: 8.9 mg/dL (ref 8.9–10.3)
GFR calc Af Amer: 47 mL/min — ABNORMAL LOW (ref >60)
GFR calc non Af Amer: 41 mL/min — ABNORMAL LOW (ref >60)
Glucose, Bld: 134 mg/dL — ABNORMAL HIGH (ref 65–99)
POTASSIUM: 4.4 mmol/L (ref 3.5–5.1)
SODIUM: 141 mmol/L (ref 135–145)
Total Bilirubin: 0.3 mg/dL (ref 0.3–1.2)
Total Protein: 5.3 g/dL — ABNORMAL LOW (ref 6.5–8.1)

## 2016-01-05 LAB — GLUCOSE, CAPILLARY
GLUCOSE-CAPILLARY: 114 mg/dL — AB (ref 65–99)
Glucose-Capillary: 100 mg/dL — ABNORMAL HIGH (ref 65–99)
Glucose-Capillary: 115 mg/dL — ABNORMAL HIGH (ref 65–99)
Glucose-Capillary: 128 mg/dL — ABNORMAL HIGH (ref 65–99)

## 2016-01-05 MED ORDER — ASPIRIN 325 MG PO TABS: 325.0000 mg | ORAL_TABLET | Freq: Every day | ORAL | Status: DC

## 2016-01-05 NOTE — Discharge Summary (Deleted)
Physician Discharge Summary  KERA DEACON MRN: [id number] DOB/AGE: 1930-01-25 82 y.o.  PCP: Glenda Chroman, MD   Admit date: 12/26/2015 Discharge date: 01/05/2016  Discharge Diagnoses:     Principal Problem:   Stroke [facility]) Active Problems:   Essential hypertension, benign   Type 2 diabetes mellitus (HCC)   Hyperkalemia   CHF (congestive heart failure) (HCC)   Acute encephalopathy   TIA (transient ischemic attack)   AKI (acute kidney injury) ([place])   Dehydration   Legal blindness   Palliative care encounter   Acute renal failure ([place])   Altered mental status   NSTEMI (non-ST elevated myocardial infarction) ([place])   PAF (paroxysmal atrial fibrillation) ([place])   Acute delirium   Vascular dementia   Cerebral infarction (hemorrhagic or thromboembolic)    Follow-up recommendations Follow-up with PCP in 3-5 days , including all  additional recommended appointments as below Follow-up CBC, CMP in 3-5 days Patient is being discharged with home health PT [facility] Daughter [phone]  [phone]        Current Discharge Medication List    START taking these medications   Details  aspirin 325 MG tablet Take 1 tablet (325 mg total) by mouth daily. Qty: 30 tablet, Refills: 0      CONTINUE these medications which have NOT CHANGED   Details  alendronate (FOSAMAX) 70 MG tablet Take 70 mg by mouth every 7 (seven) days. Take with a full glass of water on an empty stomach. - on Fridays    atorvastatin (LIPITOR) 10 MG tablet Take 10 mg by mouth daily.    cholestyramine (QUESTRAN) 4 G packet Take 4 g by mouth daily. Mix one packet in liquid and drink    citalopram (CELEXA) 40 MG tablet Take 1 tablet (40 mg total) by mouth daily. Qty: 30 tablet, Refills: 6    Cyanocobalamin (VITAMIN B-12) 2500 MCG SUBL Place 2,500 mcg under the tongue daily.     ferrous sulfate 325 (65 FE) MG tablet Take 325 mg by mouth daily with breakfast.    pioglitazone (ACTOS) 30 MG tablet Take 15  mg by mouth daily.     ergocalciferol (VITAMIN D2) 50000 UNITS capsule Take 50,000 Units by mouth once a week. On Sundays      STOP taking these medications     lisinopril (PRINIVIL,ZESTRIL) 40 MG tablet      metoprolol succinate (TOPROL-XL) 50 MG 24 hr tablet      mirtazapine (REMERON) 15 MG tablet          Discharge Condition: *Stable  Discharge Instructions Get Medicines reviewed and adjusted: Please take all your medications with you for your next visit with your Primary MD  Please request your Primary MD to go over all hospital tests and procedure/radiological results at the follow up, please ask your Primary MD to get all Hospital records sent to his/her office.  If you experience worsening of your admission symptoms, develop shortness of breath, life threatening emergency, suicidal or homicidal thoughts you must seek medical attention immediately by calling 911 or calling your MD immediately if symptoms less severe.  You must read complete instructions/literature along with all the possible adverse reactions/side effects for all the Medicines you take and that have been prescribed to you. Take any new Medicines after you have completely understood and accpet all the possible adverse reactions/side effects.   Do not drive when taking Pain medications.   Do not take more than prescribed Pain, Sleep and Anxiety Medications  Special Instructions:  If you have smoked or chewed Tobacco in the last 2 yrs please stop smoking, stop any regular Alcohol and or any Recreational drug use.  Wear Seat belts while driving.  Please note  You were cared for by a hospitalist during your hospital stay. Once you are discharged, your primary care physician will handle any further medical issues. Please note that NO REFILLS for any discharge medications will be authorized once you are discharged, as it is imperative that you return to your primary care physician (or establish a relationship  with a primary care physician if you do not have one) for your aftercare needs so that they can reassess your need for medications and monitor your lab values.  Discharge Instructions    Ambulatory referral to Neurology    Complete by:  As directed   Follow up with Dr. Jaynee Eagles at [facility] in about one month. Pt is Dr. Cathren Laine pt. Thanks.            Allergies  Allergen Reactions  . Codeine Nausea Only      Disposition: SNF    Consults:  Cardiology Neurology     Significant Diagnostic Studies:  Ct Head Wo Contrast  12/31/2015  CLINICAL DATA:  Cerebral infarction EXAM: CT HEAD WITHOUT CONTRAST TECHNIQUE: Contiguous axial images were obtained from the base of the skull through the vertex without intravenous contrast. COMPARISON:  MRI 12/27/2015 FINDINGS: Multiple small areas of acute infarct in the cerebellum bilaterally as well as in the cerebral hemispheres and basal ganglia bilaterally are best seen on prior MRI. These are difficult to see on CT. No large territory acute infarct. Negative for hemorrhage or mass Moderate atrophy. Mild chronic microvascular ischemic change in the white matter. Mucous retention cyst right maxillary sinus.  Calvarium intact IMPRESSION: Generalized atrophy with mild chronic microvascular ischemia. Numerous small areas of acute infarct throughout the brain bilaterally best seen on MRI. No large territory acute infarct or hemorrhage. Electronically Signed   By: Franchot Gallo M.D.   On: 12/31/2015 19:50   Ct Head Wo Contrast  12/26/2015  ADDENDUM REPORT: 12/26/2015 22:37 ADDENDUM: Critical Value/emergent results were called by telephone at the time of interpretation on 12/26/2015 at 10:30 pm to Dr. Nicole Kindred, neurology, who verbally acknowledged these results. Electronically Signed   By: Lowella Grip III M.D.   On: 12/26/2015 22:37  12/26/2015  CLINICAL DATA:  Confusion and agitation EXAM: CT HEAD WITHOUT CONTRAST TECHNIQUE: Contiguous axial images were obtained  from the base of the skull through the vertex without intravenous contrast. COMPARISON:  Brain MRI August 09, 2014 FINDINGS: Motion artifact makes this study less than optimal. There is moderate diffuse atrophy. There is no intracranial mass, hemorrhage, extra-axial fluid collection, or midline shift. There is patchy small vessel disease in the centra semiovale bilaterally. No acute infarct is evident. The middle cerebral artery attenuation is symmetric bilaterally. IMPRESSION: Suboptimal study due to motion artifact. Atrophy with patchy periventricular small vessel disease. No acute infarct is evident. No hemorrhage or mass effect. Note that subtle early infarct could be obscured given this degree of motion artifact, particularly in the posterior fossa region. Electronically Signed: By: Lowella Grip III M.D. On: 12/26/2015 21:59   Ct Chest Wo Contrast  12/30/2015  CLINICAL DATA:  Shortness of breath and chest pain EXAM: CT CHEST WITHOUT CONTRAST TECHNIQUE: Multidetector CT imaging of the chest was performed following the standard protocol without IV contrast. COMPARISON:  07/08/2014 FINDINGS: A few small nodules are noted within the right  lower lobe. The largest of these is noted on image number 33 of series 3 and measures 6 mm. Bilateral pleural effusions are noted of a small moderate degree. Mild bibasilar atelectasis is noted. A few calcified nodules are noted consistent with calcified granulomas. The previously seen left lower lobe nodule is stable in appearance best seen on image number 234 of series for. The thoracic inlet is within normal limits. The aorta is calcified with evidence of coronary calcifications and mitral annular calcifications. No significant hilar or mediastinal adenopathy is noted. The upper abdomen shows evidence of prior cholecystectomy. No other focal abnormality is seen. The osseous structures show degenerative change of the thoracic spine. IMPRESSION: Few scattered small  nodules in the lungs bilaterally. These are stable from the prior exam and felt to be benign in etiology. Changes of prior granulomatous disease. Bilateral pleural effusions with mild bibasilar atelectasis. Electronically Signed   By: Inez Catalina M.D.   On: 12/30/2015 18:15   Mr Brain Wo Contrast  12/27/2015  CLINICAL DATA:  Patient found down. Erratic behavior. Word salad, facial droop. Not recognizing family. Confusion and agitation. Hypertension. Diabetes. EXAM: MRI HEAD WITHOUT CONTRAST MRA HEAD WITHOUT CONTRAST TECHNIQUE: Multiplanar, multiecho pulse sequences of the brain and surrounding structures were obtained without intravenous contrast. Angiographic images of the head were obtained using MRA technique without contrast. COMPARISON:  CT head 12/26/2015.  MR head 08/09/2014. FINDINGS: MRI HEAD FINDINGS Multifocal subcentimeter areas of restricted diffusion throughout both cerebral hemispheres, also involving the basal ganglia, also involving BILATERAL cerebellum in a symmetric fashion, consistent with watershed infarcts. Multiple emboli are not excluded but less favored, given symmetry and multiplicity. No hemorrhage, mass lesion,  or extra-axial fluid. Global atrophy. Chronic microvascular ischemic change. Remote lacunar infarcts. Flow voids are maintained. No midline abnormalities. Extracranial soft tissues unremarkable. BILATERAL cataract extraction. Compared with prior CT, the infarcts are not visible. Compared with prior MR, the infarcts are new. MRA HEAD FINDINGS Anterior circulation: Moderate irregularity of the cavernous ICA on the RIGHT, potential 50-75% stenosis. Mild narrowing of the supraclinoid ICA on the RIGHT. LEFT ICA demonstrates mild non stenotic irregularity throughout its skullbase and supraclinoid segments. No MCA stenosis or occlusion. Moderate irregularity of the non dominant A1 ACA on the RIGHT. Moderate irregularity of the distal MCA branches, insufficiently well-visualized to  characterize stenosis versus occlusion distally. Unremarkable distal anterior cerebral arteries. Posterior circulation: Basilar artery widely patent. Vertebrals codominant. Unremarkable posterior cerebral arteries. No visible cerebellar branch occlusion. No visible intracranial aneurysm. IMPRESSION: Multifocal areas of acute nonhemorrhagic infarction, subcentimeter in size of a symmetric nature affecting the cortex, subcortical white matter, basal ganglia, and cerebellum. These are most consistent with watershed infarcts, commonly associated with cardiac arrhythmia or hypotensive event. Shower of emboli not excluded but less favored. Atrophy and small vessel disease similar to priors. No large vessel occlusion on MRA. Potentially flow reducing stenosis of the cavernous ICA on the RIGHT, 50-75%. Electronically Signed   By: Staci Righter M.D.   On: 12/27/2015 10:09   Ct Abdomen Pelvis W Contrast  12/27/2015  CLINICAL DATA:  Vomiting.  History colon cancer. EXAM: CT ABDOMEN AND PELVIS WITH CONTRAST TECHNIQUE: Multidetector CT imaging of the abdomen and pelvis was performed using the standard protocol following bolus administration of intravenous contrast. CONTRAST:  82m ISOVUE-300 IOPAMIDOL (ISOVUE-300) INJECTION 61% COMPARISON:  None. FINDINGS: Lower chest: Mild pleural effusion bilaterally with mild compressive atelectasis in the bases. Extensive calcification of the mitral annulus. Heart size normal Hepatobiliary: Postop cholecystectomy. Bile  ducts nondilated. No liver lesion. Pancreas: Negative Spleen: Negative Adrenals/Urinary Tract: Negative for renal obstruction. No mass or stone. Urinary bladder normal. Stomach/Bowel: Negative for bowel obstruction. Moderate sigmoid diverticulosis. Negative for diverticulitis. Surgical clips in the cecum presumably from appendectomy. No evidence of acute appendicitis. Vascular/Lymphatic: Atherosclerotic calcification in the aorta and iliac arteries. No aneurysm. Negative  for lymphadenopathy Reproductive: Negative for a uterine mass.  No pelvic mass. Other: No free-fluid Musculoskeletal: Limit evaluation of the lumbar spine due to motion. Image quality degraded by motion. The patient moved significantly midway through the scan IMPRESSION: Small bilateral effusions Sigmoid diverticulosis. Negative for diverticulitis. Probable appendectomy clips in the cecum. Electronically Signed   By: Franchot Gallo M.D.   On: 12/27/2015 14:34   US Renal  12/28/2015  CLINICAL DATA:  Acute renal failure EXAM: RENAL / URINARY TRACT ULTRASOUND COMPLETE COMPARISON:  CT abdomen 12/27/2015 FINDINGS: Right Kidney: Length: 9.4 cm. Mild cortical thinning. Normal renal cortical echogenicity. No hydronephrosis. Left Kidney: Length: 9.5 cm. Mild cortical thinning. Normal renal cortical echogenicity. No hydronephrosis. Bladder: Appears normal for degree of bladder distention. Right pleural effusion. IMPRESSION: No hydronephrosis. Mild renal atrophy. Electronically Signed   By: Lovey Newcomer M.D.   On: 12/28/2015 17:17   Dg Chest Port 1 View  12/26/2015  CLINICAL DATA:  Shortness of breath with altered mental status EXAM: PORTABLE CHEST 1 VIEW COMPARISON:  December 10, 2010 FINDINGS: There is diffuse interstitial edema. There is cardiomegaly with pulmonary venous hypertension. No airspace consolidation. No adenopathy. There is calcification in the mitral annulus. There is degenerative change in the thoracic spine. IMPRESSION: Findings consistent with congestive heart failure. No airspace consolidation. Electronically Signed   By: Lowella Grip III M.D.   On: 12/26/2015 23:33   Mr Jodene Nam Head/brain Wo Cm  12/27/2015  CLINICAL DATA:  Patient found down. Erratic behavior. Word salad, facial droop. Not recognizing family. Confusion and agitation. Hypertension. Diabetes. EXAM: MRI HEAD WITHOUT CONTRAST MRA HEAD WITHOUT CONTRAST TECHNIQUE: Multiplanar, multiecho pulse sequences of the brain and surrounding  structures were obtained without intravenous contrast. Angiographic images of the head were obtained using MRA technique without contrast. COMPARISON:  CT head 12/26/2015.  MR head 08/09/2014. FINDINGS: MRI HEAD FINDINGS Multifocal subcentimeter areas of restricted diffusion throughout both cerebral hemispheres, also involving the basal ganglia, also involving BILATERAL cerebellum in a symmetric fashion, consistent with watershed infarcts. Multiple emboli are not excluded but less favored, given symmetry and multiplicity. No hemorrhage, mass lesion,  or extra-axial fluid. Global atrophy. Chronic microvascular ischemic change. Remote lacunar infarcts. Flow voids are maintained. No midline abnormalities. Extracranial soft tissues unremarkable. BILATERAL cataract extraction. Compared with prior CT, the infarcts are not visible. Compared with prior MR, the infarcts are new. MRA HEAD FINDINGS Anterior circulation: Moderate irregularity of the cavernous ICA on the RIGHT, potential 50-75% stenosis. Mild narrowing of the supraclinoid ICA on the RIGHT. LEFT ICA demonstrates mild non stenotic irregularity throughout its skullbase and supraclinoid segments. No MCA stenosis or occlusion. Moderate irregularity of the non dominant A1 ACA on the RIGHT. Moderate irregularity of the distal MCA branches, insufficiently well-visualized to characterize stenosis versus occlusion distally. Unremarkable distal anterior cerebral arteries. Posterior circulation: Basilar artery widely patent. Vertebrals codominant. Unremarkable posterior cerebral arteries. No visible cerebellar branch occlusion. No visible intracranial aneurysm. IMPRESSION: Multifocal areas of acute nonhemorrhagic infarction, subcentimeter in size of a symmetric nature affecting the cortex, subcortical white matter, basal ganglia, and cerebellum. These are most consistent with watershed infarcts, commonly associated with cardiac arrhythmia or  hypotensive event. Shower of  emboli not excluded but less favored. Atrophy and small vessel disease similar to priors. No large vessel occlusion on MRA. Potentially flow reducing stenosis of the cavernous ICA on the RIGHT, 50-75%. Electronically Signed   By: Staci Righter M.D.   On: 12/27/2015 10:09      2-D echo LV EF: 60% - 65%  ------------------------------------------------------------------- Indications: CVA 436.  ------------------------------------------------------------------- History: PMH: Elevated troponins. Hypertension. Hyperlipidemia. Diabetes. Blind. Colon cancer.  ------------------------------------------------------------------- Study Conclusions  - Left ventricle: The cavity size was normal. Wall thickness was  increased in a pattern of mild LVH. Systolic function was normal.  The estimated ejection fraction was in the range of 60% to 65%.  Wall motion was normal; there were no regional wall motion  abnormalities. Doppler parameters are consistent with abnormal  left ventricular relaxation (grade 1 diastolic dysfunction). - Aortic valve: Severely calcified annulus. Moderately thickened,  moderately calcified leaflets. There was mild stenosis. Valve  area (VTI): 1.86 cm^2. Valve area (Vmax): 1.75 cm^2. Valve area  (Vmean): 1.56 cm^2. - Mitral valve: Moderately calcified annulus. Moderately thickened,  moderately calcified leaflets . The findings are consistent with  mild stenosis. There was mild regurgitation. Valve area by  continuity equation (using LVOT flow): 1.37 cm^2. - Left atrium: The atrium was mildly dilated. - Right atrium: The atrium was moderately dilated.   Cardiac catheterization 1. Prox RCA lesion, 20% stenosed. 2. Ost Ramus to Ramus lesion, 40% stenosed. 3. Elevated LVEDP, 27 mmHg   Filed Weights   01/03/16 0500 01/04/16 0405 01/05/16 0500  Weight: 69.2 kg (152 lb 8.9 oz) 67.6 kg (149 lb 0.5 oz) 67.223 kg (148 lb 3.2 oz)      Microbiology: Recent Results (from the past 240 hour(s))  Urine culture     Status: None   Collection Time: 12/26/15 10:25 PM  Result Value Ref Range Status   Specimen Description URINE, RANDOM  Final   Special Requests Normal  Final   Culture NO GROWTH 1 DAY  Final   Report Status 12/28/2015 FINAL  Final  MRSA PCR Screening     Status: None   Collection Time: 12/27/15  9:07 PM  Result Value Ref Range Status   MRSA by PCR NEGATIVE NEGATIVE Final    Comment:        The GeneXpert MRSA Assay (FDA approved for NASAL specimens only), is one component of a comprehensive MRSA colonization surveillance program. It is not intended to diagnose MRSA infection nor to guide or monitor treatment for MRSA infections.   Urine culture     Status: None   Collection Time: 12/29/15  7:47 PM  Result Value Ref Range Status   Specimen Description URINE, CATHETERIZED  Final   Special Requests NONE  Final   Culture NO GROWTH 2 DAYS  Final   Report Status 12/31/2015 FINAL  Final       Blood Culture    Component Value Date/Time   SDES URINE, CATHETERIZED 12/29/2015 1947   SPECREQUEST NONE 12/29/2015 1947   CULT NO GROWTH 2 DAYS 12/29/2015 1947   REPTSTATUS 12/31/2015 FINAL 12/29/2015 1947      Labs: Results for orders placed or performed during the hospital encounter of 12/26/15 (from the past 48 hour(s))  Glucose, capillary     Status: Abnormal   Collection Time: 01/03/16  5:09 PM  Result Value Ref Range   Glucose-Capillary 201 (H) 65 - 99 mg/dL  Heparin level (unfractionated)     Status: None  Collection Time: 01/03/16  6:42 PM  Result Value Ref Range   Heparin Unfractionated 0.57 0.30 - 0.70 IU/mL    Comment:        IF HEPARIN RESULTS ARE BELOW EXPECTED VALUES, AND PATIENT DOSAGE HAS BEEN CONFIRMED, SUGGEST FOLLOW UP TESTING OF ANTITHROMBIN III LEVELS.   Glucose, capillary     Status: Abnormal   Collection Time: 01/03/16  9:35 PM  Result Value Ref Range    Glucose-Capillary 168 (H) 65 - 99 mg/dL  CBC     Status: Abnormal   Collection Time: 01/04/16  4:50 AM  Result Value Ref Range   WBC 7.9 4.0 - 10.5 K/uL   RBC 3.23 (L) 3.87 - 5.11 MIL/uL   Hemoglobin 9.2 (L) 12.0 - 15.0 g/dL   HCT 28.2 (L) 36.0 - 46.0 %   MCV 87.3 78.0 - 100.0 fL   MCH 28.5 26.0 - 34.0 pg   MCHC 32.6 30.0 - 36.0 g/dL   RDW 15.6 (H) 11.5 - 15.5 %   Platelets 185 150 - 400 K/uL  Heparin level (unfractionated)     Status: None   Collection Time: 01/04/16  4:50 AM  Result Value Ref Range   Heparin Unfractionated 0.40 0.30 - 0.70 IU/mL    Comment:        IF HEPARIN RESULTS ARE BELOW EXPECTED VALUES, AND PATIENT DOSAGE HAS BEEN CONFIRMED, SUGGEST FOLLOW UP TESTING OF ANTITHROMBIN III LEVELS.   Basic metabolic panel     Status: Abnormal   Collection Time: 01/04/16  4:50 AM  Result Value Ref Range   Sodium 142 135 - 145 mmol/L   Potassium 4.7 3.5 - 5.1 mmol/L   Chloride 115 (H) 101 - 111 mmol/L   CO2 19 (L) 22 - 32 mmol/L   Glucose, Bld 156 (H) 65 - 99 mg/dL   BUN 20 6 - 20 mg/dL   Creatinine, Ser 1.30 (H) 0.44 - 1.00 mg/dL   Calcium 8.8 (L) 8.9 - 10.3 mg/dL   GFR calc non Af Amer 37 (L) >60 mL/min   GFR calc Af Amer 43 (L) >60 mL/min    Comment: (NOTE) The eGFR has been calculated using the CKD EPI equation. This calculation has not been validated in all clinical situations. eGFR's persistently <60 mL/min signify possible Chronic Kidney Disease.    Anion gap 8 5 - 15  Glucose, capillary     Status: Abnormal   Collection Time: 01/04/16  8:18 AM  Result Value Ref Range   Glucose-Capillary 115 (H) 65 - 99 mg/dL  Glucose, capillary     Status: Abnormal   Collection Time: 01/04/16 12:37 PM  Result Value Ref Range   Glucose-Capillary 101 (H) 65 - 99 mg/dL  Glucose, capillary     Status: Abnormal   Collection Time: 01/05/16 12:23 AM  Result Value Ref Range   Glucose-Capillary 114 (H) 65 - 99 mg/dL  CBC     Status: Abnormal   Collection Time: 01/05/16  4:30  AM  Result Value Ref Range   WBC 6.3 4.0 - 10.5 K/uL   RBC 3.30 (L) 3.87 - 5.11 MIL/uL   Hemoglobin 9.1 (L) 12.0 - 15.0 g/dL   HCT 28.9 (L) 36.0 - 46.0 %   MCV 87.6 78.0 - 100.0 fL   MCH 27.6 26.0 - 34.0 pg   MCHC 31.5 30.0 - 36.0 g/dL   RDW 15.9 (H) 11.5 - 15.5 %   Platelets 197 150 - 400 K/uL  Comprehensive metabolic panel  Status: Abnormal   Collection Time: 01/05/16  4:30 AM  Result Value Ref Range   Sodium 141 135 - 145 mmol/L   Potassium 4.4 3.5 - 5.1 mmol/L   Chloride 114 (H) 101 - 111 mmol/L   CO2 19 (L) 22 - 32 mmol/L   Glucose, Bld 134 (H) 65 - 99 mg/dL   BUN 13 6 - 20 mg/dL   Creatinine, Ser 1.20 (H) 0.44 - 1.00 mg/dL   Calcium 8.9 8.9 - 10.3 mg/dL   Total Protein 5.3 (L) 6.5 - 8.1 g/dL   Albumin 2.6 (L) 3.5 - 5.0 g/dL   AST 16 15 - 41 U/L   ALT 27 14 - 54 U/L   Alkaline Phosphatase 55 38 - 126 U/L   Total Bilirubin 0.3 0.3 - 1.2 mg/dL   GFR calc non Af Amer 41 (L) >60 mL/min   GFR calc Af Amer 47 (L) >60 mL/min    Comment: (NOTE) The eGFR has been calculated using the CKD EPI equation. This calculation has not been validated in all clinical situations. eGFR's persistently <60 mL/min signify possible Chronic Kidney Disease.    Anion gap 8 5 - 15  Glucose, capillary     Status: Abnormal   Collection Time: 01/05/16 12:11 PM  Result Value Ref Range   Glucose-Capillary 100 (H) 65 - 99 mg/dL     Lipid Panel     Component Value Date/Time   CHOL 180 12/27/2015 0517   TRIG 68 12/27/2015 0517   HDL 82 12/27/2015 0517   CHOLHDL 2.2 12/27/2015 0517   VLDL 14 12/27/2015 0517   LDLCALC 84 12/27/2015 0517     Lab Results  Component Value Date   HGBA1C 6.6* 12/27/2015   HGBA1C 6.5* 12/27/2015     Lab Results  Component Value Date   LDLCALC 84 12/27/2015   CREATININE 1.20* 01/05/2016     82 y.o. female with a history of type 2 diabetes mellitus, colon cancer, hypertension, hyperlipidemia, macular degeneration with blindness and an equivocal history  of cognitive impairment, brought to the emergency room for evaluation of confusion and slurred speech that was noted several hours prior to this admission. She was unable to provide any history at the time of the admission and while in ED she has vomited multiple times. CT scan of her head showed no acute intracranial abnormality. She has remained lethargic throughout the ED evaluation. Code stroke was subsequently cancelled, as acute aphasia was not clear and patient had no clear focal motor deficits.    Assessment & Plan:  Bilateral, infra and supra-tentorial infarcts, c/w cardioembolic infarcts secondary to either atrial fibrillation vs acute MI - Resultant L facial, L arm and leg weakness, improving overall  - MRIAcute bilateral infra- and supratentorial small infarcts - Carotid Doppler and ECHO with normal EF 04%, normal systolic function, grade I diastolic CHF Started on IV Heparin , consulted cardiology, status postcardiac cath - PT eval done, recommends HH PT, OT eval done, recommends SNF, which patient declined - SLP also done, pt passed eval and regular thin diet recommended  - spoke with Dr. Leonie Man, stroke specialist, no further recommendations from their standpoint  Status post left heart cath That showed nonobstructive disease , continue aspirin 325 mg There are no plans for TEE or loop recorder, as she is not thought to be a good candidate for long-term anticoagulation.  Acute coronary syndrome vs atrial fibrillation, ? NSTEMI, demand ischemia  - Troponin 0.05 on arrival, up to 7.77 -->  19 --> 32 --> 35 --> 39 --> 27 --> 23 --> 12 --> 6 - cardiology following, Continue aspirin, atorvastatin, and heparin. Cardiology discontinued heparin after cath   Acute encephalopathy, likely toxic/medication-induced - seems to be related to Ativan she gets if she is agitated  - zyprexa seems to be helping and will be continued    Leukocytosis - no signs of PNA on imaging studies and  no UTI - discontinued Rocephin 4/17, pt has received total of three days of rocephin  - WBC remains stable and WNL   Hypertension, essential  - reasonably stable this AM  Diabetes, type II, on oral antihyperglycemics - on Actos at home - here on SSI, diet advanced to carb modified and pt tolerating well   Acute kidney injury with metabolic acidosis , 6.92>4.9 - from acute illness, pre renal etiology, demand ischemia - renal US with no acute abnormalities noted  - Cr nicely trending down,    Transaminitis - from acute illness - resolved   Hyperkalemia - resolved   Thrombocytopenia - reactive  - resolved   Anemia of critical illness - no indication of transfusion at this time  - no signs of bleeding - CBC in AM    Discharge Exam:    Blood pressure 134/92, pulse 71, temperature 98.5 F (36.9 C), temperature source Oral, resp. rate 18, height '5\' 5"'$  (1.651 m), weight 67.223 kg (148 lb 3.2 oz), SpO2 98 %.      Follow-up Information    Follow up with Melvenia Beam, MD. Schedule an appointment as soon as possible for a visit in 1 month.   Specialty:  Neurology   Contact information:   [address] [place] [address] [phone]       Follow up with Glenda Chroman, MD. Schedule an appointment as soon as possible for a visit in 3 days.   Specialty:  Internal Medicine   Contact information:   [place] [address] [phone]       Signed: Reyne Dumas 01/05/2016, 1:38 PM        Time spent >45 mins

## 2016-01-05 NOTE — Progress Notes (Signed)
Patient ID: Laura Parks, female   DOB: November 18, 1929, 82 y.o.   MRN: [id number]    PROGRESS NOTE    Laura Parks  [id number] DOB: 1930-02-22 DOA: 12/26/2015  PCP: Glenda Chroman, MD   Outpatient Specialists: Dr. Laural Golden - GI, Dr. Stanford Breed - ortho   Brief Narrative:   82 y.o. female with a history of type 2 diabetes mellitus, colon cancer, hypertension, hyperlipidemia, macular degeneration with blindness and an equivocal history of cognitive impairment, brought to the emergency room for evaluation of confusion and slurred speech that was noted several hours prior to this admission. She was unable to provide any history at the time of the admission and while in ED she has vomited multiple times. CT scan of her head showed no acute intracranial abnormality. She has remained lethargic throughout the ED evaluation. Code stroke was subsequently cancelled, as acute aphasia was not clear and patient had no clear focal motor deficits.  Events since admission: Noted to be bradycardic this morning with heart rate in the 30s and 40s, blood pressure stable    Assessment & Plan:   Bilateral, infra and supra-tentorial infarcts, c/w cardioembolic infarcts secondary to either atrial fibrillation vs acute MI - Resultant L facial, L arm and leg weakness, improving overall  - MRIAcute bilateral infra- and supratentorial small infarcts - Carotid Doppler and ECHO with normal EF [id number], normal systolic function, grade I diastolic CHF - currently on IV Heparin and will continue per cardiology, plan for cath today - PT eval done, recommends HH PT, OT eval done, recommends SNF - SLP also done, pt passed eval and regular thin diet recommended  - spoke with Dr. Leonie Man, stroke specialist, no further recommendations from their standpoint  Status post left heart cath today , results per cardiology There are no plans for TEE or loop recorder, as she is not thought to be a good candidate for long-term  anticoagulation.   Acute coronary syndrome vs atrial fibrillation, ? NSTEMI, demand ischemia  - Troponin 0.05 on arrival, up to 7.77 --> 19 --> 32 --> 35 --> 39 --> 27 --> 23 --> 12 --> 6 - cardiology following, Continue aspirin, atorvastatin, and heparin. Cardiology discontinued heparin after cath   Acute encephalopathy, likely toxic/medication-induced - seems to be related to Ativan she gets if she is agitated  - zyprexa seems to be helping - would recommend avoiding ativan if possible   Leukocytosis - no signs of PNA on imaging studies and no UTI - discontinued Rocephin 4/17, pt has received total of three days of rocephin  - WBC remains stable and WNL   Hypertension, essential  - reasonably stable this AM  Diabetes, type II, on oral antihyperglycemics - on Actos at home - here on SSI, diet advanced to carb modified and pt tolerating well   Acute kidney injury with metabolic acidosis , 0000000 - from acute illness, pre renal etiology, demand ischemia - renal US with no acute abnormalities noted  - Cr nicely trending down,      Transaminitis - from acute illness - resolved   Hyperkalemia - resolved   Thrombocytopenia - reactive  - resolved   Anemia of critical illness - no indication of transfusion at this time  - no signs of bleeding - CBC in AM  DVT prophylaxis: On Heparin drip  Code Status: Full  Family Communication: Multiple family members over the phone  Disposition Plan: Disposition per cardiology    Consultants:   Cardiology  Neurology  [name]  SW  PT/OT/SLP  Procedures:   None   Antimicrobials:   Rocephin 4/15 --> 4/17  Subjective: Patient resting comfortably in bed, somewhat confused  Objective: Filed Vitals:   01/04/16 1800 01/04/16 2010 01/05/16 0055 01/05/16 0500  BP: 141/93 141/71 150/62 115/80  Pulse: 75 77 70 34  Temp: 98.1 F (36.7 C) 98.2 F (36.8 C) 98.3 F (36.8 C) 98.6 F (37 C)  TempSrc: Oral Oral Oral  Tympanic  Resp: 18 18 20 20   Height:      Weight:    67.223 kg (148 lb 3.2 oz)  SpO2: 98% 99% 100% 86%    Intake/Output Summary (Last 24 hours) at 01/05/16 0845 Last data filed at 01/05/16 0500  Gross per 24 hour  Intake 293.25 ml  Output    900 ml  Net -606.75 ml   Filed Weights   01/03/16 0500 01/04/16 0405 01/05/16 0500  Weight: 69.2 kg (152 lb 8.9 oz) 67.6 kg (149 lb 0.5 oz) 67.223 kg (148 lb 3.2 oz)    Examination:  General exam: alert and awake, follows commands, NAD Respiratory system: Diminished breath sounds at bases, scattered rhonchi at bases  Cardiovascular system: S1 & S2 heard, RRR. No JVD, murmurs Gastrointestinal system: Abdomen is nondistended, soft and nontender.  Central nervous system: alert, follows commands, moving all 4 extremities against gravity   Data Reviewed: I have personally reviewed following labs and imaging studies  CBC:  Recent Labs Lab 01/01/16 0301 01/02/16 0501 01/03/16 0347 01/04/16 0450 01/05/16 0430  WBC 7.3 8.6 8.3 7.9 6.3  HGB 9.3* 9.4* 9.2* 9.2* 9.1*  HCT 28.8* 29.6* 29.1* 28.2* 28.9*  MCV 85.5 85.8 87.1 87.3 87.6  PLT 160 165 186 185 [id number]   Basic Metabolic Panel:  Recent Labs Lab 01/01/16 0301 01/02/16 0501 01/03/16 0907 01/04/16 0450 01/05/16 0430  NA 140 142 143 142 141  K 4.3 4.4 4.5 4.7 4.4  CL 111 115* 114* 115* 114*  CO2 19* 21* 20* 19* 19*  GLUCOSE 114* 215* 118* 156* 134*  BUN 27* 19 16 20 13   CREATININE 1.52* 1.45* 1.26* 1.30* 1.20*  CALCIUM 8.5* 9.0 9.2 8.8* 8.9   Liver Function Tests:  Recent Labs Lab 12/30/15 0115 12/31/15 0615 01/01/16 0301 01/02/16 0501 01/05/16 0430  AST 103* 56* 37 28 16  ALT 103* 80* 66* 51 27  ALKPHOS 55 55 57 58 55  BILITOT 0.5 0.5 0.6 0.4 0.3  PROT 5.3* 5.0* 5.2* 5.6* 5.3*  ALBUMIN 2.8* 2.7* 2.8* 2.8* 2.6*   Cardiac Enzymes:  Recent Labs Lab 12/30/15 1155 12/30/15 1752 12/30/15 2320 12/31/15 0615 12/31/15 1151  TROPONINI 9.61* 9.71* 9.47* 7.88* 6.43*    CBG:  Recent Labs Lab 01/03/16 1709 01/03/16 2135 01/04/16 0818 01/04/16 1237 01/05/16 0023  GLUCAP 201* 168* 115* 101* 114*   Urine analysis:    Component Value Date/Time   COLORURINE YELLOW 01/01/2016 [place] 01/01/2016 1618   LABSPEC 1.008 01/01/2016 1618   PHURINE 5.0 01/01/2016 [place] 01/01/2016 [place] 01/01/2016 [place] 01/01/2016 1618   KETONESUR NEGATIVE 01/01/2016 1618   PROTEINUR NEGATIVE 01/01/2016 1618   NITRITE NEGATIVE 01/01/2016 1618   LEUKOCYTESUR NEGATIVE 01/01/2016 1618   Recent Results (from the past 240 hour(s))  Urine culture     Status: None   Collection Time: 12/26/15 10:25 PM  Result Value Ref Range Status   Specimen Description URINE, RANDOM  Final  Special Requests Normal  Final   Culture NO GROWTH 1 DAY  Final   Report Status 12/28/2015 FINAL  Final  MRSA PCR Screening     Status: None   Collection Time: 12/27/15  9:07 PM  Result Value Ref Range Status   MRSA by PCR NEGATIVE NEGATIVE Final  Urine culture     Status: None   Collection Time: 12/29/15  7:47 PM  Result Value Ref Range Status   Specimen Description URINE, CATHETERIZED  Final   Special Requests NONE  Final   Culture NO GROWTH 2 DAYS  Final   Report Status 12/31/2015 FINAL  Final    Radiology Studies: Ct Abdomen Pelvis W Contrast 12/27/2015   Small bilateral effusions Sigmoid diverticulosis. Negative for diverticulitis.   US Renal 12/28/2015  No hydronephrosis. Mild renal atrophy.   Scheduled Meds: . acetaminophen  650 mg Oral QID  . antiseptic oral rinse  7 mL Mouth Rinse BID  . aspirin  300 mg Rectal Daily   Or  . aspirin  325 mg Oral Daily  . atorvastatin  10 mg Oral Daily  . bisacodyl  5 mg Oral Daily  . cholestyramine light  4 g Oral BID  . citalopram  20 mg Oral Daily  . feeding supplement (GLUCERNA SHAKE)  237 mL Oral TID BM  . Gerhardt's butt cream   Topical TID  . insulin aspart  0-9  Units Subcutaneous TID WC  . mirtazapine  15 mg Oral QHS  . OLANZapine zydis  2.5 mg Oral QHS  . sodium chloride flush  3 mL Intravenous Q12H   Continuous Infusions:    LOS: 9 days   Time spent: 20 minutes   Reyne Dumas, MD [facility] Pager [phone]  If 7PM-7AM, please contact night-coverage [email or web address] Password [facility] 01/05/2016, 8:45 AM

## 2016-01-05 NOTE — Progress Notes (Signed)
No hemodynamically significant CAD by cath. Continue ASA and statin.

## 2016-01-06 ENCOUNTER — Inpatient Hospital Stay (HOSPITAL_COMMUNITY): Payer: Medicare Other

## 2016-01-06 DIAGNOSIS — I639 Cerebral infarction, unspecified: Principal | ICD-10-CM

## 2016-01-06 DIAGNOSIS — I63439 Cerebral infarction due to embolism of unspecified posterior cerebral artery: Secondary | ICD-10-CM

## 2016-01-06 LAB — COMPREHENSIVE METABOLIC PANEL
ALT: 28 U/L (ref 14–54)
ANION GAP: 11 (ref 5–15)
AST: 34 U/L (ref 15–41)
Albumin: 3.2 g/dL — ABNORMAL LOW (ref 3.5–5.0)
Alkaline Phosphatase: 67 U/L (ref 38–126)
BUN: 12 mg/dL (ref 6–20)
CHLORIDE: 114 mmol/L — AB (ref 101–111)
CO2: 17 mmol/L — AB (ref 22–32)
CREATININE: 1.17 mg/dL — AB (ref 0.44–1.00)
Calcium: 9.4 mg/dL (ref 8.9–10.3)
GFR, EST AFRICAN AMERICAN: 49 mL/min — AB (ref >60)
GFR, EST NON AFRICAN AMERICAN: 42 mL/min — AB (ref >60)
Glucose, Bld: 124 mg/dL — ABNORMAL HIGH (ref 65–99)
Potassium: 5.4 mmol/L — ABNORMAL HIGH (ref 3.5–5.1)
SODIUM: 142 mmol/L (ref 135–145)
Total Bilirubin: 1.6 mg/dL — ABNORMAL HIGH (ref 0.3–1.2)
Total Protein: 6 g/dL — ABNORMAL LOW (ref 6.5–8.1)

## 2016-01-06 LAB — CBC
HCT: 31.7 % — ABNORMAL LOW (ref 36.0–46.0)
Hemoglobin: 10.3 g/dL — ABNORMAL LOW (ref 12.0–15.0)
MCH: 28.1 pg (ref 26.0–34.0)
MCHC: 32.5 g/dL (ref 30.0–36.0)
MCV: 86.6 fL (ref 78.0–100.0)
PLATELETS: 214 10*3/uL (ref 150–400)
RBC: 3.66 MIL/uL — AB (ref 3.87–5.11)
RDW: 16.1 % — AB (ref 11.5–15.5)
WBC: 8.9 10*3/uL (ref 4.0–10.5)

## 2016-01-06 LAB — GLUCOSE, CAPILLARY
GLUCOSE-CAPILLARY: 104 mg/dL — AB (ref 65–99)
GLUCOSE-CAPILLARY: 113 mg/dL — AB (ref 65–99)

## 2016-01-06 MED ORDER — OLANZAPINE 2.5 MG PO TABS: 2.5000 mg | ORAL_TABLET | Freq: Every day | ORAL | Status: DC

## 2016-01-06 NOTE — Progress Notes (Signed)
Patient very confused and agitated. Patient attempting to get out of bed without assistance. Patient frequently reoriented to current setting. Bed alarm on. Will continue to monitor closely.

## 2016-01-06 NOTE — Care Management (Addendum)
CM contacted patient's son Kasandra Knudsen to arrange [facility] PT at discharge. Kasandra Knudsen confirms the plan is for his mother to be discharged home today with home health PT. CM asked which [facility] agency he would like to use for his mother's care. Danny requested CM speak with his sister [name]. [name] states she does not want her mother to be discharged home with PT. She would like for mother to be discharged to SNF. She states her mother is hallucinating and she was told by the physicians while in step-down that she needs to be discharged to SNF. Explained the discharge order as of today. She states she has not spoken with any of the patient's doctors in 2 days. She has not been here to speak with the physicians. She states she has "calls in to Dr. Doyle Askew and Dr. Hilma Favors" about her mother. CM states she will contact the physician who has discharged the patient and inform her of her request to speak with her and for her mother to go to a SNF. CM spoke with Dr. Allyson Sabal who will contact the patient's daughter. Presenter, broadcasting BSN CCM

## 2016-01-06 NOTE — Progress Notes (Addendum)
Physical Therapy Treatment Patient Details Name: Laura Parks MRN: [id number] DOB: 1929-12-01 Today's Date: 01/06/2016    History of Present Illness  82 yo female admitted 12/26/15 with Lt sided weakness, slurred speech, confusion.  Patient with bilateral CVA's, elevated troponin, dehydration, acute kidney injury.  Troponin beginning to drop today, though still elevated.  PMH:  DM, colon CA, HTN, HLD, macular degeneration, legally blind, HOH, decreased cognition    PT Comments    Patient has made great gains with mobility and gait.  Able to ambulate 21' with RW and supervision.  Feel patient can return home safely with f/u HHPT and 24 hour assist.  Patient and family in agreement (Daughter, Son, Husband)  Will need RW and 3-in-1 BSC.  Follow Up Recommendations  Home health PT;Supervision/Assistance - 24 hour     Equipment Recommendations  Rolling walker with 5" wheels;3in1 (PT)    Recommendations for Other Services       Precautions / Restrictions Precautions Precautions: Fall Restrictions Weight Bearing Restrictions: No    Mobility  Bed Mobility Overal bed mobility: Needs Assistance Bed Mobility: Supine to Sit;Sit to Supine     Supine to sit: Supervision Sit to supine: Supervision   General bed mobility comments: Patient able to perform supine <> sit with no physical assist.  Supervision for safety only.  Transfers Overall transfer level: Needs assistance Equipment used: Rolling walker (2 wheeled) Transfers: Sit to/from Stand Sit to Stand: Supervision         General transfer comment: Repeated cueing for hand placement.  Practiced sit <> stand x3 for hand placement/technique.  No physical assist needed.  Ambulation/Gait Ambulation/Gait assistance: Supervision Ambulation Distance (Feet): 86 Feet Assistive device: Rolling walker (2 wheeled) Gait Pattern/deviations: Step-through pattern;Decreased stride length;Trunk flexed Gait velocity: decreased Gait velocity  interpretation: Below normal speed for age/gender General Gait Details: Patient demonstrates safe use of RW.  Supervision for safety.  Cues to stand upright during gait.   Stairs            Wheelchair Mobility    Modified Rankin (Stroke Patients Only)       Balance           Standing balance support: Bilateral upper extremity supported Standing balance-Leahy Scale: Poor Standing balance comment: Requires UE support for dynamic activities                    Cognition Arousal/Alertness: Awake/alert Behavior During Therapy: WFL for tasks assessed/performed Overall Cognitive Status: History of cognitive impairments - at baseline                      Exercises      General Comments        Pertinent Vitals/Pain Pain Assessment: No/denies pain    Home Living                      Prior Function            PT Goals (current goals can now be found in the care plan section) Progress towards PT goals: Progressing toward goals    Frequency  Min 2X/week    PT Plan Discharge plan needs to be updated;Equipment recommendations need to be updated    Co-evaluation             End of Session Equipment Utilized During Treatment: Gait belt Activity Tolerance: Patient tolerated treatment well Patient left: in bed;with call bell/phone within reach;with family/visitor present  Time: [id number] PT Time Calculation (min) (ACUTE ONLY): 16 min  Charges:  $Gait Training: 8-22 mins                    G Codes:      Despina Pole 02-01-16, 11:56 AM Carita Pian. Sanjuana Kava, [place] Pager [phone]

## 2016-01-06 NOTE — Progress Notes (Signed)
Patient discharged home with home health. Family present during discharge teaching, no further questions at this time.

## 2016-01-06 NOTE — Discharge Instructions (Signed)
Heart-Healthy Eating Plan Heart-healthy meal planning includes:  Limiting unhealthy fats.  Increasing healthy fats.  Making other small dietary changes. You may need to talk with your doctor or a diet specialist (dietitian) to create an eating plan that is right for you. WHAT TYPES OF FAT SHOULD I CHOOSE?  Choose healthy fats. These include olive oil and canola oil, flaxseeds, walnuts, almonds, and seeds.  Eat more omega-3 fats. These include salmon, mackerel, sardines, tuna, flaxseed oil, and ground flaxseeds. Try to eat fish at least twice each week.  Limit saturated fats.  Saturated fats are often found in animal products, such as meats, butter, and cream.  Plant sources of saturated fats include palm oil, palm kernel oil, and coconut oil.  Avoid foods with partially hydrogenated oils in them. These include stick margarine, some tub margarines, cookies, crackers, and other baked goods. These contain trans fats. WHAT GENERAL GUIDELINES DO I NEED TO FOLLOW?  Check food labels carefully. Identify foods with trans fats or high amounts of saturated fat.  Fill one half of your plate with vegetables and green salads. Eat 4-5 servings of vegetables per day. A serving of vegetables is:  1 cup of raw leafy vegetables.   cup of raw or cooked cut-up vegetables.   cup of vegetable juice.  Fill one fourth of your plate with whole grains. Look for the word "whole" as the first word in the ingredient list.  Fill one fourth of your plate with lean protein foods.  Eat 4-5 servings of fruit per day. A serving of fruit is:  One medium whole fruit.   cup of dried fruit.   cup of fresh, frozen, or canned fruit.   cup of 100% fruit juice.  Eat more foods that contain soluble fiber. These include apples, broccoli, carrots, beans, peas, and barley. Try to get 20-30 g of fiber per day.  Eat more home-cooked food. Eat less restaurant, buffet, and fast food.  Limit or avoid  alcohol.  Limit foods high in starch and sugar.  Avoid fried foods.  Avoid frying your food. Try baking, boiling, grilling, or broiling it instead. You can also reduce fat by:  Removing the skin from poultry.  Removing all visible fats from meats.  Skimming the fat off of stews, soups, and gravies before serving them.  Steaming vegetables in water or broth.  Lose weight if you are overweight.  Eat 4-5 servings of nuts, legumes, and seeds per week:  One serving of dried beans or legumes equals  cup after being cooked.  One serving of nuts equals 1 ounces.  One serving of seeds equals  ounce or one tablespoon.  You may need to keep track of how much salt or sodium you eat. This is especially true if you have high blood pressure. Talk with your doctor or dietitian to get more information. WHAT FOODS CAN I EAT? Grains Breads, including [place], white, pita, wheat, raisin, rye, oatmeal, and [place]. Tortillas that are neither fried nor made with lard or trans fat. Low-fat rolls, including hotdog and hamburger buns and English muffins. Biscuits. Muffins. Waffles. Pancakes. Light popcorn. Whole-grain cereals. Flatbread. Melba toast. Pretzels. Breadsticks. Rusks. Low-fat snacks. Low-fat crackers, including oyster, saltine, matzo, graham, animal, and rye. Rice and pasta, including brown rice and pastas that are made with whole wheat.  Vegetables All vegetables.  Fruits All fruits, but limit coconut. Meats and Other Protein Sources Lean, well-trimmed beef, veal, pork, and lamb. Chicken and [place] without skin. All fish and  shellfish. Wild duck, rabbit, pheasant, and venison. Egg whites or low-cholesterol egg substitutes. Dried beans, peas, lentils, and tofu. Seeds and most nuts. Dairy Low-fat or nonfat cheeses, including ricotta, string, and mozzarella. Skim or 1% milk that is liquid, powdered, or evaporated. Buttermilk that is made with low-fat milk. Nonfat or low-fat  yogurt. Beverages Mineral water. Diet carbonated beverages. Sweets and Desserts Sherbets and fruit ices. Honey, jam, marmalade, jelly, and syrups. Meringues and gelatins. Pure sugar candy, such as hard candy, jelly beans, gumdrops, mints, marshmallows, and small amounts of dark chocolate. [employer] Eat all sweets and desserts in moderation. Fats and Oils Nonhydrogenated (trans-free) margarines. Vegetable oils, including soybean, sesame, sunflower, olive, peanut, safflower, corn, canola, and cottonseed. Salad dressings or mayonnaise made with a vegetable oil. Limit added fats and oils that you use for cooking, baking, salads, and as spreads. Other Cocoa powder. Coffee and tea. All seasonings and condiments. The items listed above may not be a complete list of recommended foods or beverages. Contact your dietitian for more options. WHAT FOODS ARE NOT RECOMMENDED? Grains Breads that are made with saturated or trans fats, oils, or whole milk. Croissants. Butter rolls. Cheese breads. Sweet rolls. Donuts. Buttered popcorn. Chow mein noodles. High-fat crackers, such as cheese or butter crackers. Meats and Other Protein Sources Fatty meats, such as hotdogs, short ribs, sausage, spareribs, bacon, rib eye roast or steak, and mutton. High-fat deli meats, such as salami and bologna. Caviar. Domestic duck and goose. Organ meats, such as kidney, liver, sweetbreads, and heart. Dairy Cream, sour cream, cream cheese, and creamed cottage cheese. Whole-milk cheeses, including blue (bleu), Monte[place] Jack, [place], [place], American, [place], Swiss, cheddar, [place], and [place]. Whole or 2% milk that is liquid, evaporated, or condensed. Whole buttermilk. Cream sauce or high-fat cheese sauce. Yogurt that is made from whole milk. Beverages Regular sodas and juice drinks with added sugar. Sweets and Desserts Frosting. Pudding. Cookies. Cakes other than angel food cake. Candy that has milk chocolate or white  chocolate, hydrogenated fat, butter, coconut, or unknown ingredients. Buttered syrups. Full-fat ice cream or ice cream drinks. Fats and Oils Gravy that has suet, meat fat, or shortening. Cocoa butter, hydrogenated oils, palm oil, coconut oil, palm kernel oil. These can often be found in baked products, candy, fried foods, nondairy creamers, and whipped toppings. Solid fats and shortenings, including bacon fat, salt pork, lard, and butter. Nondairy cream substitutes, such as coffee creamers and sour cream substitutes. Salad dressings that are made of unknown oils, cheese, or sour cream. The items listed above may not be a complete list of foods and beverages to avoid. Contact your dietitian for more information.   This information is not intended to replace advice given to you by your health care provider. Make sure you discuss any questions you have with your health care provider.   Document Released: 03/02/2012 Document Revised: 09/22/2014 Document Reviewed: 02/23/2014 Elsevier Interactive Patient Education [employer].

## 2016-01-06 NOTE — Care Management Note (Addendum)
Case Management Note  Patient Details  Name: Laura Parks MRN: [id number] Date of Birth: 03/20/1930  Subjective/Objective:                  NSTEMI  Action/Plan: CM spoke with patient, her daughter and husband at the bedside. Daughter selected [facility] for [facility] PT,OT, RN and HHA. Tiffany at [facility] notified for the request for services and discharge date of today. Provided [facility] with daughter's cell phone number per her request. Merry Proud at [facility] notified order for 3N1 and RW. DME delivered.   Expected Discharge Date:    01/06/16              Expected Discharge Plan:  [place]  In-House Referral:     Discharge planning Services  CM Consult  Post Acute Care Choice:  Home Health Choice offered to:  Adult Children, Patient  DME Arranged:  3-N-1, Walker rolling DME Agency:  [place] Arranged:  RN, PT, OT, Nurse's Aide [place] Agency:  [place]  Status of Service:  Completed, signed off  Medicare Important Message Given:  Yes Date Medicare IM Given:    Medicare IM give by:    Date Additional Medicare IM Given:    Additional Medicare Important Message give by:     If discussed at [place] of Stay Meetings, dates discussed:    Additional Comments:  Apolonio Schneiders, RN 01/06/2016, 12:47 PM

## 2016-01-07 ENCOUNTER — Encounter (HOSPITAL_COMMUNITY): Payer: Self-pay | Admitting: Cardiology

## 2016-01-09 DIAGNOSIS — I10 Essential (primary) hypertension: Secondary | ICD-10-CM | POA: Diagnosis not present

## 2016-01-09 DIAGNOSIS — Z8673 Personal history of transient ischemic attack (TIA), and cerebral infarction without residual deficits: Secondary | ICD-10-CM | POA: Diagnosis not present

## 2016-01-09 DIAGNOSIS — H548 Legal blindness, as defined in USA: Secondary | ICD-10-CM | POA: Diagnosis not present

## 2016-01-09 DIAGNOSIS — E782 Mixed hyperlipidemia: Secondary | ICD-10-CM | POA: Diagnosis not present

## 2016-01-09 DIAGNOSIS — D510 Vitamin B12 deficiency anemia due to intrinsic factor deficiency: Secondary | ICD-10-CM | POA: Diagnosis not present

## 2016-01-09 DIAGNOSIS — I4891 Unspecified atrial fibrillation: Secondary | ICD-10-CM | POA: Diagnosis not present

## 2016-01-09 DIAGNOSIS — E119 Type 2 diabetes mellitus without complications: Secondary | ICD-10-CM | POA: Diagnosis not present

## 2016-01-09 DIAGNOSIS — C189 Malignant neoplasm of colon, unspecified: Secondary | ICD-10-CM | POA: Diagnosis not present

## 2016-01-09 DIAGNOSIS — I214 Non-ST elevation (NSTEMI) myocardial infarction: Secondary | ICD-10-CM | POA: Diagnosis not present

## 2016-01-09 DIAGNOSIS — I509 Heart failure, unspecified: Secondary | ICD-10-CM | POA: Diagnosis not present

## 2016-01-10 DIAGNOSIS — Z8673 Personal history of transient ischemic attack (TIA), and cerebral infarction without residual deficits: Secondary | ICD-10-CM | POA: Diagnosis not present

## 2016-01-10 DIAGNOSIS — C189 Malignant neoplasm of colon, unspecified: Secondary | ICD-10-CM | POA: Diagnosis not present

## 2016-01-10 DIAGNOSIS — I4891 Unspecified atrial fibrillation: Secondary | ICD-10-CM | POA: Diagnosis not present

## 2016-01-10 DIAGNOSIS — I639 Cerebral infarction, unspecified: Secondary | ICD-10-CM | POA: Diagnosis not present

## 2016-01-10 DIAGNOSIS — I214 Non-ST elevation (NSTEMI) myocardial infarction: Secondary | ICD-10-CM | POA: Diagnosis not present

## 2016-01-10 DIAGNOSIS — E1142 Type 2 diabetes mellitus with diabetic polyneuropathy: Secondary | ICD-10-CM | POA: Diagnosis not present

## 2016-01-10 DIAGNOSIS — E119 Type 2 diabetes mellitus without complications: Secondary | ICD-10-CM | POA: Diagnosis not present

## 2016-01-10 DIAGNOSIS — Z299 Encounter for prophylactic measures, unspecified: Secondary | ICD-10-CM | POA: Diagnosis not present

## 2016-01-10 DIAGNOSIS — I509 Heart failure, unspecified: Secondary | ICD-10-CM | POA: Diagnosis not present

## 2016-01-10 DIAGNOSIS — E1129 Type 2 diabetes mellitus with other diabetic kidney complication: Secondary | ICD-10-CM | POA: Diagnosis not present

## 2016-01-10 DIAGNOSIS — E1159 Type 2 diabetes mellitus with other circulatory complications: Secondary | ICD-10-CM | POA: Diagnosis not present

## 2016-01-11 DIAGNOSIS — I4891 Unspecified atrial fibrillation: Secondary | ICD-10-CM | POA: Diagnosis not present

## 2016-01-11 DIAGNOSIS — C189 Malignant neoplasm of colon, unspecified: Secondary | ICD-10-CM | POA: Diagnosis not present

## 2016-01-11 DIAGNOSIS — Z8673 Personal history of transient ischemic attack (TIA), and cerebral infarction without residual deficits: Secondary | ICD-10-CM | POA: Diagnosis not present

## 2016-01-11 DIAGNOSIS — I214 Non-ST elevation (NSTEMI) myocardial infarction: Secondary | ICD-10-CM | POA: Diagnosis not present

## 2016-01-11 DIAGNOSIS — E119 Type 2 diabetes mellitus without complications: Secondary | ICD-10-CM | POA: Diagnosis not present

## 2016-01-11 DIAGNOSIS — I509 Heart failure, unspecified: Secondary | ICD-10-CM | POA: Diagnosis not present

## 2016-01-14 DIAGNOSIS — I4891 Unspecified atrial fibrillation: Secondary | ICD-10-CM | POA: Diagnosis not present

## 2016-01-14 DIAGNOSIS — C189 Malignant neoplasm of colon, unspecified: Secondary | ICD-10-CM | POA: Diagnosis not present

## 2016-01-14 DIAGNOSIS — I214 Non-ST elevation (NSTEMI) myocardial infarction: Secondary | ICD-10-CM | POA: Diagnosis not present

## 2016-01-14 DIAGNOSIS — I509 Heart failure, unspecified: Secondary | ICD-10-CM | POA: Diagnosis not present

## 2016-01-14 DIAGNOSIS — I639 Cerebral infarction, unspecified: Secondary | ICD-10-CM

## 2016-01-14 DIAGNOSIS — E119 Type 2 diabetes mellitus without complications: Secondary | ICD-10-CM | POA: Diagnosis not present

## 2016-01-14 DIAGNOSIS — Z8673 Personal history of transient ischemic attack (TIA), and cerebral infarction without residual deficits: Secondary | ICD-10-CM | POA: Diagnosis not present

## 2016-01-14 HISTORY — DX: Cerebral infarction, unspecified: I63.9

## 2016-01-15 DIAGNOSIS — I509 Heart failure, unspecified: Secondary | ICD-10-CM | POA: Diagnosis not present

## 2016-01-15 DIAGNOSIS — I214 Non-ST elevation (NSTEMI) myocardial infarction: Secondary | ICD-10-CM | POA: Diagnosis not present

## 2016-01-15 DIAGNOSIS — Z8673 Personal history of transient ischemic attack (TIA), and cerebral infarction without residual deficits: Secondary | ICD-10-CM | POA: Diagnosis not present

## 2016-01-15 DIAGNOSIS — E119 Type 2 diabetes mellitus without complications: Secondary | ICD-10-CM | POA: Diagnosis not present

## 2016-01-15 DIAGNOSIS — I4891 Unspecified atrial fibrillation: Secondary | ICD-10-CM | POA: Diagnosis not present

## 2016-01-15 DIAGNOSIS — C189 Malignant neoplasm of colon, unspecified: Secondary | ICD-10-CM | POA: Diagnosis not present

## 2016-01-16 DIAGNOSIS — I4891 Unspecified atrial fibrillation: Secondary | ICD-10-CM | POA: Diagnosis not present

## 2016-01-16 DIAGNOSIS — C189 Malignant neoplasm of colon, unspecified: Secondary | ICD-10-CM | POA: Diagnosis not present

## 2016-01-16 DIAGNOSIS — I214 Non-ST elevation (NSTEMI) myocardial infarction: Secondary | ICD-10-CM | POA: Diagnosis not present

## 2016-01-16 DIAGNOSIS — I509 Heart failure, unspecified: Secondary | ICD-10-CM | POA: Diagnosis not present

## 2016-01-16 DIAGNOSIS — E119 Type 2 diabetes mellitus without complications: Secondary | ICD-10-CM | POA: Diagnosis not present

## 2016-01-16 DIAGNOSIS — Z8673 Personal history of transient ischemic attack (TIA), and cerebral infarction without residual deficits: Secondary | ICD-10-CM | POA: Diagnosis not present

## 2016-01-17 DIAGNOSIS — C189 Malignant neoplasm of colon, unspecified: Secondary | ICD-10-CM | POA: Diagnosis not present

## 2016-01-17 DIAGNOSIS — I214 Non-ST elevation (NSTEMI) myocardial infarction: Secondary | ICD-10-CM | POA: Diagnosis not present

## 2016-01-17 DIAGNOSIS — I509 Heart failure, unspecified: Secondary | ICD-10-CM | POA: Diagnosis not present

## 2016-01-17 DIAGNOSIS — I4891 Unspecified atrial fibrillation: Secondary | ICD-10-CM | POA: Diagnosis not present

## 2016-01-17 DIAGNOSIS — Z8673 Personal history of transient ischemic attack (TIA), and cerebral infarction without residual deficits: Secondary | ICD-10-CM | POA: Diagnosis not present

## 2016-01-17 DIAGNOSIS — E119 Type 2 diabetes mellitus without complications: Secondary | ICD-10-CM | POA: Diagnosis not present

## 2016-01-18 DIAGNOSIS — I509 Heart failure, unspecified: Secondary | ICD-10-CM | POA: Diagnosis not present

## 2016-01-18 DIAGNOSIS — I4891 Unspecified atrial fibrillation: Secondary | ICD-10-CM | POA: Diagnosis not present

## 2016-01-18 DIAGNOSIS — C189 Malignant neoplasm of colon, unspecified: Secondary | ICD-10-CM | POA: Diagnosis not present

## 2016-01-18 DIAGNOSIS — E119 Type 2 diabetes mellitus without complications: Secondary | ICD-10-CM | POA: Diagnosis not present

## 2016-01-18 DIAGNOSIS — Z8673 Personal history of transient ischemic attack (TIA), and cerebral infarction without residual deficits: Secondary | ICD-10-CM | POA: Diagnosis not present

## 2016-01-18 DIAGNOSIS — I214 Non-ST elevation (NSTEMI) myocardial infarction: Secondary | ICD-10-CM | POA: Diagnosis not present

## 2016-01-21 DIAGNOSIS — F329 Major depressive disorder, single episode, unspecified: Secondary | ICD-10-CM | POA: Diagnosis not present

## 2016-01-21 DIAGNOSIS — I10 Essential (primary) hypertension: Secondary | ICD-10-CM | POA: Diagnosis not present

## 2016-01-21 DIAGNOSIS — I214 Non-ST elevation (NSTEMI) myocardial infarction: Secondary | ICD-10-CM | POA: Diagnosis not present

## 2016-01-21 DIAGNOSIS — E7800 Pure hypercholesterolemia, unspecified: Secondary | ICD-10-CM | POA: Diagnosis not present

## 2016-01-21 DIAGNOSIS — I4891 Unspecified atrial fibrillation: Secondary | ICD-10-CM | POA: Diagnosis not present

## 2016-01-21 DIAGNOSIS — E119 Type 2 diabetes mellitus without complications: Secondary | ICD-10-CM | POA: Diagnosis not present

## 2016-01-21 DIAGNOSIS — C189 Malignant neoplasm of colon, unspecified: Secondary | ICD-10-CM | POA: Diagnosis not present

## 2016-01-21 DIAGNOSIS — I509 Heart failure, unspecified: Secondary | ICD-10-CM | POA: Diagnosis not present

## 2016-01-21 DIAGNOSIS — Z8673 Personal history of transient ischemic attack (TIA), and cerebral infarction without residual deficits: Secondary | ICD-10-CM | POA: Diagnosis not present

## 2016-01-22 DIAGNOSIS — I4891 Unspecified atrial fibrillation: Secondary | ICD-10-CM | POA: Diagnosis not present

## 2016-01-22 DIAGNOSIS — I509 Heart failure, unspecified: Secondary | ICD-10-CM | POA: Diagnosis not present

## 2016-01-22 DIAGNOSIS — I214 Non-ST elevation (NSTEMI) myocardial infarction: Secondary | ICD-10-CM | POA: Diagnosis not present

## 2016-01-22 DIAGNOSIS — Z8673 Personal history of transient ischemic attack (TIA), and cerebral infarction without residual deficits: Secondary | ICD-10-CM | POA: Diagnosis not present

## 2016-01-22 DIAGNOSIS — E119 Type 2 diabetes mellitus without complications: Secondary | ICD-10-CM | POA: Diagnosis not present

## 2016-01-22 DIAGNOSIS — C189 Malignant neoplasm of colon, unspecified: Secondary | ICD-10-CM | POA: Diagnosis not present

## 2016-01-23 DIAGNOSIS — C189 Malignant neoplasm of colon, unspecified: Secondary | ICD-10-CM | POA: Diagnosis not present

## 2016-01-23 DIAGNOSIS — Z8673 Personal history of transient ischemic attack (TIA), and cerebral infarction without residual deficits: Secondary | ICD-10-CM | POA: Diagnosis not present

## 2016-01-23 DIAGNOSIS — I4891 Unspecified atrial fibrillation: Secondary | ICD-10-CM | POA: Diagnosis not present

## 2016-01-23 DIAGNOSIS — I509 Heart failure, unspecified: Secondary | ICD-10-CM | POA: Diagnosis not present

## 2016-01-23 DIAGNOSIS — E119 Type 2 diabetes mellitus without complications: Secondary | ICD-10-CM | POA: Diagnosis not present

## 2016-01-23 DIAGNOSIS — I214 Non-ST elevation (NSTEMI) myocardial infarction: Secondary | ICD-10-CM | POA: Diagnosis not present

## 2016-01-24 DIAGNOSIS — Z789 Other specified health status: Secondary | ICD-10-CM | POA: Diagnosis not present

## 2016-01-24 DIAGNOSIS — E1129 Type 2 diabetes mellitus with other diabetic kidney complication: Secondary | ICD-10-CM | POA: Diagnosis not present

## 2016-01-24 DIAGNOSIS — R42 Dizziness and giddiness: Secondary | ICD-10-CM | POA: Diagnosis not present

## 2016-01-24 DIAGNOSIS — R110 Nausea: Secondary | ICD-10-CM | POA: Diagnosis not present

## 2016-01-25 DIAGNOSIS — I509 Heart failure, unspecified: Secondary | ICD-10-CM | POA: Diagnosis not present

## 2016-01-25 DIAGNOSIS — Z8673 Personal history of transient ischemic attack (TIA), and cerebral infarction without residual deficits: Secondary | ICD-10-CM | POA: Diagnosis not present

## 2016-01-25 DIAGNOSIS — E119 Type 2 diabetes mellitus without complications: Secondary | ICD-10-CM | POA: Diagnosis not present

## 2016-01-25 DIAGNOSIS — C189 Malignant neoplasm of colon, unspecified: Secondary | ICD-10-CM | POA: Diagnosis not present

## 2016-01-25 DIAGNOSIS — I4891 Unspecified atrial fibrillation: Secondary | ICD-10-CM | POA: Diagnosis not present

## 2016-01-25 DIAGNOSIS — I214 Non-ST elevation (NSTEMI) myocardial infarction: Secondary | ICD-10-CM | POA: Diagnosis not present

## 2016-01-30 ENCOUNTER — Emergency Department (HOSPITAL_COMMUNITY): Payer: Medicare Other

## 2016-01-30 ENCOUNTER — Inpatient Hospital Stay (HOSPITAL_COMMUNITY)
Admission: EM | Admit: 2016-01-30 | Discharge: 2016-02-02 | DRG: 065 | Disposition: A | Discharge status: Home / Self Care | Payer: Medicare Other | Attending: Internal Medicine | Admitting: Internal Medicine

## 2016-01-30 ENCOUNTER — Encounter (HOSPITAL_COMMUNITY): Payer: Self-pay

## 2016-01-30 DIAGNOSIS — I6340 Cerebral infarction due to embolism of unspecified cerebral artery: Principal | ICD-10-CM | POA: Diagnosis present

## 2016-01-30 DIAGNOSIS — I371 Nonrheumatic pulmonary valve insufficiency: Secondary | ICD-10-CM | POA: Diagnosis present

## 2016-01-30 DIAGNOSIS — I10 Essential (primary) hypertension: Secondary | ICD-10-CM | POA: Diagnosis present

## 2016-01-30 DIAGNOSIS — Z885 Allergy status to narcotic agent status: Secondary | ICD-10-CM | POA: Diagnosis not present

## 2016-01-30 DIAGNOSIS — I4891 Unspecified atrial fibrillation: Secondary | ICD-10-CM | POA: Diagnosis not present

## 2016-01-30 DIAGNOSIS — E782 Mixed hyperlipidemia: Secondary | ICD-10-CM | POA: Diagnosis present

## 2016-01-30 DIAGNOSIS — I2510 Atherosclerotic heart disease of native coronary artery without angina pectoris: Secondary | ICD-10-CM | POA: Diagnosis present

## 2016-01-30 DIAGNOSIS — I452 Bifascicular block: Secondary | ICD-10-CM | POA: Diagnosis present

## 2016-01-30 DIAGNOSIS — F0150 Vascular dementia without behavioral disturbance: Secondary | ICD-10-CM | POA: Diagnosis present

## 2016-01-30 DIAGNOSIS — R269 Unspecified abnormalities of gait and mobility: Secondary | ICD-10-CM | POA: Diagnosis not present

## 2016-01-30 DIAGNOSIS — R410 Disorientation, unspecified: Secondary | ICD-10-CM | POA: Diagnosis not present

## 2016-01-30 DIAGNOSIS — Z85038 Personal history of other malignant neoplasm of large intestine: Secondary | ICD-10-CM

## 2016-01-30 DIAGNOSIS — D509 Iron deficiency anemia, unspecified: Secondary | ICD-10-CM | POA: Diagnosis present

## 2016-01-30 DIAGNOSIS — Z79899 Other long term (current) drug therapy: Secondary | ICD-10-CM

## 2016-01-30 DIAGNOSIS — E119 Type 2 diabetes mellitus without complications: Secondary | ICD-10-CM | POA: Diagnosis not present

## 2016-01-30 DIAGNOSIS — I480 Paroxysmal atrial fibrillation: Secondary | ICD-10-CM | POA: Diagnosis present

## 2016-01-30 DIAGNOSIS — Z9849 Cataract extraction status, unspecified eye: Secondary | ICD-10-CM | POA: Diagnosis not present

## 2016-01-30 DIAGNOSIS — N184 Chronic kidney disease, stage 4 (severe): Secondary | ICD-10-CM | POA: Diagnosis present

## 2016-01-30 DIAGNOSIS — E1122 Type 2 diabetes mellitus with diabetic chronic kidney disease: Secondary | ICD-10-CM | POA: Diagnosis present

## 2016-01-30 DIAGNOSIS — I6389 Other cerebral infarction: Secondary | ICD-10-CM

## 2016-01-30 DIAGNOSIS — I509 Heart failure, unspecified: Secondary | ICD-10-CM

## 2016-01-30 DIAGNOSIS — H548 Legal blindness, as defined in USA: Secondary | ICD-10-CM | POA: Diagnosis present

## 2016-01-30 DIAGNOSIS — I63439 Cerebral infarction due to embolism of unspecified posterior cerebral artery: Secondary | ICD-10-CM

## 2016-01-30 DIAGNOSIS — R001 Bradycardia, unspecified: Secondary | ICD-10-CM | POA: Diagnosis present

## 2016-01-30 DIAGNOSIS — I130 Hypertensive heart and chronic kidney disease with heart failure and stage 1 through stage 4 chronic kidney disease, or unspecified chronic kidney disease: Secondary | ICD-10-CM | POA: Diagnosis present

## 2016-01-30 DIAGNOSIS — Z8673 Personal history of transient ischemic attack (TIA), and cerebral infarction without residual deficits: Secondary | ICD-10-CM

## 2016-01-30 DIAGNOSIS — H3530 Unspecified macular degeneration: Secondary | ICD-10-CM | POA: Diagnosis present

## 2016-01-30 DIAGNOSIS — I638 Other cerebral infarction: Secondary | ICD-10-CM | POA: Diagnosis not present

## 2016-01-30 DIAGNOSIS — E1159 Type 2 diabetes mellitus with other circulatory complications: Secondary | ICD-10-CM | POA: Diagnosis not present

## 2016-01-30 DIAGNOSIS — R2681 Unsteadiness on feet: Secondary | ICD-10-CM | POA: Diagnosis present

## 2016-01-30 DIAGNOSIS — I214 Non-ST elevation (NSTEMI) myocardial infarction: Secondary | ICD-10-CM | POA: Diagnosis not present

## 2016-01-30 DIAGNOSIS — I6300 Cerebral infarction due to thrombosis of unspecified precerebral artery: Secondary | ICD-10-CM | POA: Diagnosis not present

## 2016-01-30 DIAGNOSIS — Z7982 Long term (current) use of aspirin: Secondary | ICD-10-CM | POA: Diagnosis not present

## 2016-01-30 DIAGNOSIS — C189 Malignant neoplasm of colon, unspecified: Secondary | ICD-10-CM | POA: Diagnosis not present

## 2016-01-30 DIAGNOSIS — I340 Nonrheumatic mitral (valve) insufficiency: Secondary | ICD-10-CM | POA: Diagnosis not present

## 2016-01-30 DIAGNOSIS — I639 Cerebral infarction, unspecified: Secondary | ICD-10-CM | POA: Diagnosis not present

## 2016-01-30 HISTORY — DX: Legal blindness, as defined in USA: H54.8

## 2016-01-30 HISTORY — DX: Atherosclerotic heart disease of native coronary artery without angina pectoris: I25.10

## 2016-01-30 HISTORY — DX: Cerebral infarction, unspecified: I63.9

## 2016-01-30 LAB — MAGNESIUM: Magnesium: 1.8 mg/dL (ref 1.7–2.4)

## 2016-01-30 LAB — COMPREHENSIVE METABOLIC PANEL
ALBUMIN: 3.6 g/dL (ref 3.5–5.0)
ALK PHOS: 71 U/L (ref 38–126)
ALT: 12 U/L — ABNORMAL LOW (ref 14–54)
ANION GAP: 8 (ref 5–15)
AST: 21 U/L (ref 15–41)
BUN: 26 mg/dL — ABNORMAL HIGH (ref 6–20)
CALCIUM: 8.6 mg/dL — AB (ref 8.9–10.3)
CO2: 23 mmol/L (ref 22–32)
Chloride: 102 mmol/L (ref 101–111)
Creatinine, Ser: 1.53 mg/dL — ABNORMAL HIGH (ref 0.44–1.00)
GFR calc Af Amer: 35 mL/min — ABNORMAL LOW (ref >60)
GFR calc non Af Amer: 30 mL/min — ABNORMAL LOW (ref >60)
GLUCOSE: 136 mg/dL — AB (ref 65–99)
POTASSIUM: 4 mmol/L (ref 3.5–5.1)
SODIUM: 133 mmol/L — AB (ref 135–145)
Total Bilirubin: 0.6 mg/dL (ref 0.3–1.2)
Total Protein: 6.8 g/dL (ref 6.5–8.1)

## 2016-01-30 LAB — URINALYSIS, ROUTINE W REFLEX MICROSCOPIC
BILIRUBIN URINE: NEGATIVE
Glucose, UA: NEGATIVE mg/dL
HGB URINE DIPSTICK: NEGATIVE
Ketones, ur: NEGATIVE mg/dL
Nitrite: NEGATIVE
PH: 5.5 (ref 5.0–8.0)
SPECIFIC GRAVITY, URINE: 1.015 (ref 1.005–1.030)

## 2016-01-30 LAB — CBC WITH DIFFERENTIAL/PLATELET
Basophils Absolute: 0 10*3/uL (ref 0.0–0.1)
Basophils Relative: 0 %
Eosinophils Absolute: 0 10*3/uL (ref 0.0–0.7)
Eosinophils Relative: 0 %
HEMATOCRIT: 32.9 % — AB (ref 36.0–46.0)
HEMOGLOBIN: 10.7 g/dL — AB (ref 12.0–15.0)
LYMPHS ABS: 1.8 10*3/uL (ref 0.7–4.0)
Lymphocytes Relative: 23 %
MCH: 28.8 pg (ref 26.0–34.0)
MCHC: 32.5 g/dL (ref 30.0–36.0)
MCV: 88.7 fL (ref 78.0–100.0)
MONOS PCT: 14 %
Monocytes Absolute: 1.1 10*3/uL — ABNORMAL HIGH (ref 0.1–1.0)
NEUTROS ABS: 4.9 10*3/uL (ref 1.7–7.7)
NEUTROS PCT: 63 %
Platelets: 158 10*3/uL (ref 150–400)
RBC: 3.71 MIL/uL — ABNORMAL LOW (ref 3.87–5.11)
RDW: 14.7 % (ref 11.5–15.5)
WBC: 7.7 10*3/uL (ref 4.0–10.5)

## 2016-01-30 LAB — TROPONIN I: Troponin I: 3.67 ng/mL (ref <0.031)

## 2016-01-30 LAB — URINE MICROSCOPIC-ADD ON

## 2016-01-30 LAB — PROTIME-INR
INR: 1.06 (ref 0.00–1.49)
PROTHROMBIN TIME: 14 s (ref 11.6–15.2)

## 2016-01-30 LAB — APTT: aPTT: 30 seconds (ref 24–37)

## 2016-01-30 MED ORDER — CITALOPRAM HYDROBROMIDE 40 MG PO TABS
40.0000 mg | ORAL_TABLET | Freq: Every day | ORAL | Status: DC
  Administered 2016-01-31 – 2016-02-01 (×2): 40 mg via ORAL
  Filled 2016-01-30: qty 1
  Filled 2016-01-30: qty 2

## 2016-01-30 MED ORDER — SODIUM CHLORIDE 0.9% FLUSH: 3.0000 mL | INTRAVENOUS | Status: DC | PRN

## 2016-01-30 MED ORDER — SODIUM CHLORIDE 0.9 % IV SOLN
250.0000 mL | INTRAVENOUS | Status: DC | PRN
Start: 2016-01-30 — End: 2016-02-02

## 2016-01-30 MED ORDER — ATORVASTATIN CALCIUM 10 MG PO TABS
10.0000 mg | ORAL_TABLET | Freq: Every day | ORAL | Status: DC
  Administered 2016-01-31 – 2016-02-02 (×3): 10 mg via ORAL
  Filled 2016-01-30 (×3): qty 1

## 2016-01-30 MED ORDER — METOPROLOL SUCCINATE ER 25 MG PO TB24
50.0000 mg | ORAL_TABLET | Freq: Every day | ORAL | Status: DC
  Filled 2016-01-30: qty 1

## 2016-01-30 MED ORDER — ASPIRIN 325 MG PO TABS
325.0000 mg | ORAL_TABLET | Freq: Every day | ORAL | Status: DC
  Administered 2016-01-31: 325 mg via ORAL
  Filled 2016-01-30: qty 1

## 2016-01-30 MED ORDER — ASPIRIN 300 MG RE SUPP: 300.0000 mg | Freq: Every day | RECTAL | Status: DC

## 2016-01-30 MED ORDER — INSULIN ASPART 100 UNIT/ML ~~LOC~~ SOLN
0.0000 [IU] | Freq: Three times a day (TID) | SUBCUTANEOUS | Status: DC
  Administered 2016-01-31: 2 [IU] via SUBCUTANEOUS
  Administered 2016-02-02: 3 [IU] via SUBCUTANEOUS

## 2016-01-30 MED ORDER — SODIUM CHLORIDE 0.9% FLUSH
3.0000 mL | Freq: Two times a day (BID) | INTRAVENOUS | Status: DC
  Administered 2016-01-31 – 2016-02-02 (×4): 3 mL via INTRAVENOUS

## 2016-01-30 MED ORDER — STROKE: EARLY STAGES OF RECOVERY BOOK
Freq: Once | Status: AC
  Administered 2016-01-31: 1
  Filled 2016-01-30: qty 1

## 2016-01-30 MED ORDER — ASPIRIN 325 MG PO TABS: 325.0000 mg | ORAL_TABLET | Freq: Every day | ORAL | Status: DC

## 2016-01-30 MED ORDER — MIRTAZAPINE 15 MG PO TABS
15.0000 mg | ORAL_TABLET | Freq: Every day | ORAL | Status: DC
  Administered 2016-01-31 – 2016-02-01 (×2): 15 mg via ORAL
  Filled 2016-01-30 (×2): qty 1

## 2016-01-30 MED ORDER — SODIUM CHLORIDE 0.9 % IV SOLN
INTRAVENOUS | Status: DC
  Administered 2016-01-30: 20:00:00 via INTRAVENOUS

## 2016-01-30 MED ORDER — HEPARIN (PORCINE) IN NACL 100-0.45 UNIT/ML-% IJ SOLN
1150.0000 [IU]/h | INTRAMUSCULAR | Status: AC
  Administered 2016-01-30 – 2016-01-31 (×2): 1150 [IU]/h via INTRAVENOUS
  Filled 2016-01-30 (×2): qty 250

## 2016-01-30 NOTE — ED Notes (Signed)
CRITICAL VALUE ALERT  Critical value received:  Troponin 3.67  Date of notification:  01/30/2016  Time of notification:  [id number]  Critical value read bac  Nurse who received alert:  Susa Day  MD notified (1st page):  Dr. Thurnell Garbe notified at 1657  Time of first page:    MD notified (2nd page):  Time of second page:  Responding MD:   Time MD responded:

## 2016-01-30 NOTE — ED Notes (Signed)
Per family patient's doesn't eat chicken or eggs.

## 2016-01-30 NOTE — Progress Notes (Signed)
ANTICOAGULATION CONSULT NOTE - Initial Consult  Pharmacy Consult for Heparin Indication: Rule out  chest pain/ACS and stroke  Allergies  Allergen Reactions  . Codeine Nausea Only   Patient Measurements: Height: 5\' 2"  (157.5 cm) Weight: 142 lb (64.411 kg) IBW/kg (Calculated) : 50.1 HEPARIN DW (KG): 63.2   Vital Signs: Temp: 98.1 F (36.7 C) (05/17 1522) Temp Source: Oral (05/17 1522) BP: 155/36 mmHg (05/17 1934) Pulse Rate: 51 (05/17 1934)  Labs:  Recent Labs  01/30/16 1535  HGB 10.7*  HCT 32.9*  PLT 158  CREATININE 1.53*  TROPONINI 3.67*   Estimated Creatinine Clearance: 24.5 mL/min (by C-G formula based on Cr of 1.53).  Medical History: Past Medical History  Diagnosis Date  . Type 2 diabetes mellitus ([place])   . Colon cancer ([place])   . Anemia   . Essential hypertension, benign   . Mixed hyperlipidemia   . Macular degeneration   . History of pneumonia   . Iron deficiency anemia     Erosive antral gastritis  . Cognitive impairment   . Coronary artery disease 01/04/2016    "Angiographically minimal coronary disease. No culprit lesion to explain significant elevation in troponin"   Medications:   (Not in a hospital admission)  Home med list reviewed.  No oral anticoagulants noted, dose take Aspirin 325mg  daily (received today)  Assessment: Okay for Protocol, baseline coags pending this admission.  Recent admission at [facility] for treatment of NSTEMI / STROKE (Discharged 01/06/16).   Head CT (-) acute infarct.   MRI = Multiple additional acute nonhemorrhagic punctate acute infarcts throughout both cerebral hemispheres and cerebellum. Watershed infarcts are previously suggested. These may again be watershed infarcts. Multiple embolic foci from a cardiac arrhythmia is also considered.  Admission H&P pending.  Heparin therapeutic @ 1150 units/hr last admission.    Goal of Therapy:  Heparin level 0.3-0.7 units/ml Monitor platelets by anticoagulation protocol:  Yes   Plan:  No Bolus Start heparin infusion at 1150 units/hr Check anti-Xa level in 6-8 hours and daily while on heparin Continue to monitor H&H and platelets  Pricilla Larsson 01/30/2016,7:50 PM

## 2016-01-30 NOTE — ED Notes (Signed)
Patient was given a dinner tray for consumption.

## 2016-01-30 NOTE — ED Notes (Signed)
Pt's daughter reports pt's husband noticed pt was confused last night and very unsteady on her feet.  Reports pt was "shuffling" to walk.  Also reports pt had a "slight" headache last night.  Denies headache.  Pt presently says she just feels a little bit weak in both legs.

## 2016-01-30 NOTE — H&P (Signed)
PCP:   Glenda Chroman, MD   Chief Complaint:  confusion  HPI: 82 yo female h/o pafib, dm, dementia hospitalized last month at [facility] with a stroke/trop over 8 had heart cath that showed no significant CAD and mri showing multiple infarcts placed on full dose of aspirin.  At that time it was thought not to do a TEE as patient was not deemed an anticoagulation candidate.  Family brought pt in again tonight for acting strange and confused.  Family not available right now.  Pt has mild dementia, she knows what year it is.  She still ambulates.  She denies any pain, but she does not recall specifics of what happened during her stay at [facility] last month.  She says her daughter makes most of the decisiions for her.  She lives with her husband.  Pt deniesa ny recent illnesses.  No weakness/numbness/tingling anywhere.  No fevers.  However im sure her history is not fully reliable.  Pt found to have more watershed infarcts on her mri today, referred for admission for further strokes in full dose of aspirin only.  Pt has not complaints right now and is pleasantly demented.  Review of Systems:  Positive and negative as per HPI otherwise all other systems are negative but highly unreliable due to component of dementia  Past Medical History: Past Medical History  Diagnosis Date  . Type 2 diabetes mellitus ([place])   . Colon cancer ([place])   . Anemia   . Essential hypertension, benign   . Mixed hyperlipidemia   . Macular degeneration   . History of pneumonia   . Iron deficiency anemia     Erosive antral gastritis  . Cognitive impairment   . Coronary artery disease 01/04/2016    "Angiographically minimal coronary disease. No culprit lesion to explain significant elevation in troponin"   Past Surgical History  Procedure Laterality Date  . Cataract extraction    . Cholecystectomy    . Appendectomy    . Right knee surgery    . Colon surgery for colon cancer      1992 by Dr Mendel Ryder  . Tubal ligation    .  Colonoscopy  03/24/2012    Procedure: COLONOSCOPY;  Surgeon: Rogene Houston, MD;  Location: [facility];  Service: Endoscopy;  Laterality: N/A;  1200  . Esophagogastroduodenoscopy  08/04/2012    Procedure: ESOPHAGOGASTRODUODENOSCOPY (EGD);  Surgeon: Rogene Houston, MD;  Location: [facility];  Service: Endoscopy;  Laterality: N/A;  325  . Cardiac catheterization N/A 01/04/2016    Procedure: Left Heart Cath and Coronary Angiography;  Surgeon: Leonie Man, MD;  Location: [facility];  Service: Cardiovascular;  Laterality: N/A;    Medications: Prior to Admission medications   Medication Sig Start Date End Date Taking? Authorizing Provider  aspirin 325 MG tablet Take 1 tablet (325 mg total) by mouth daily. 01/05/16  Yes Reyne Dumas, MD  atorvastatin (LIPITOR) 10 MG tablet Take 10 mg by mouth daily.   Yes Historical Provider, MD  cholestyramine Lucrezia Starch) 4 G packet Take 4 g by mouth daily. Mix one packet in liquid and drink   Yes Historical Provider, MD  citalopram (CELEXA) 40 MG tablet Take 1 tablet (40 mg total) by mouth daily. 08/31/14  Yes Melvenia Beam, MD  Cyanocobalamin (VITAMIN B-12) 2500 MCG SUBL Place 2,500 mcg under the tongue daily.    Yes Historical Provider, MD  ferrous sulfate 325 (65 FE) MG tablet Take 325 mg by  mouth daily with breakfast.   Yes Historical Provider, MD  metoprolol succinate (TOPROL-XL) 50 MG 24 hr tablet Take 50 mg by mouth daily. 12/22/15  Yes Historical Provider, MD  mirtazapine (REMERON) 15 MG tablet Take 15 mg by mouth at bedtime. 01/11/16  Yes Historical Provider, MD  pioglitazone (ACTOS) 15 MG tablet Take 15 mg by mouth daily. 01/10/16  Yes Historical Provider, MD  alendronate (FOSAMAX) 70 MG tablet Take 70 mg by mouth every 7 (seven) days. Take with a full glass of water on an empty stomach. - on Fridays    Historical Provider, MD  meclizine (ANTIVERT) 25 MG tablet Take 25 mg by mouth 3 (three) times daily as needed. dizziness 01/24/16   Historical  Provider, MD  OLANZapine (ZYPREXA) 2.5 MG tablet Take 1 tablet (2.5 mg total) by mouth at bedtime. Patient not taking: Reported on 01/30/2016 01/06/16   Reyne Dumas, MD  ondansetron (ZOFRAN-ODT) 4 MG disintegrating tablet Take 4 mg by mouth every 6 (six) hours as needed. Nausea and vomiting 01/24/16   Historical Provider, MD    Allergies:   Allergies  Allergen Reactions  . Codeine Nausea Only    Social History:  reports that she has never smoked. She has never used smokeless tobacco. She reports that she does not drink alcohol or use illicit drugs.  Family History: Family History  Problem Relation Age of Onset  . COPD Mother   . Alzheimer's disease Mother   . Congestive Heart Failure Mother   . Diabetes Brother     Physical Exam: Filed Vitals:   01/30/16 1900 01/30/16 1930 01/30/16 1934 01/30/16 2010  BP: 138/87 155/36 155/36 155/36  Pulse:   51 51  Temp:      TempSrc:      Resp: 18 31 20 18   Height:      Weight:      SpO2:   97% 97%   General appearance: alert, cooperative and no distress Head: Normocephalic, without obvious abnormality, atraumatic Eyes: negative Nose: Nares normal. Septum midline. Mucosa normal. No drainage or sinus tenderness. Neck: no JVD and supple, symmetrical, trachea midline Lungs: clear to auscultation bilaterally Heart: regular rate and rhythm, S1, S2 normal, no murmur, click, rub or gallop Abdomen: soft, non-tender; bowel sounds normal; no masses,  no organomegaly Extremities: extremities normal, atraumatic, no cyanosis or edema Pulses: 2+ and symmetric Skin: Skin color, texture, turgor normal. No rashes or lesions Neurologic: Grossly normal    Labs on Admission:   Recent Labs  01/30/16 1535  NA 133*  K 4.0  CL 102  CO2 23  GLUCOSE 136*  BUN 26*  CREATININE 1.53*  CALCIUM 8.6*  MG 1.8    Recent Labs  01/30/16 1535  AST 21  ALT 12*  ALKPHOS 71  BILITOT 0.6  PROT 6.8  ALBUMIN 3.6    Recent Labs  01/30/16 1535   WBC 7.7  NEUTROABS 4.9  HGB 10.7*  HCT 32.9*  MCV 88.7  PLT 158    Recent Labs  01/30/16 1535  TROPONINI 3.67*    Radiological Exams on Admission: Dg Chest 2 View  01/30/2016  CLINICAL DATA:  Confusion EXAM: CHEST  2 VIEW COMPARISON:  January 06, 2016 FINDINGS: There is no edema or consolidation. Heart is mildly enlarged with pulmonary vascularity within normal limits. There is calcification in the mitral annulus. No adenopathy. Bones are osteoporotic. There is degenerative change in the thoracic spine. IMPRESSION: Persistent mild cardiac enlargement. No edema or consolidation. Bones osteoporotic. Electronically  Signed   By: Lowella Grip III M.D.   On: 01/30/2016 16:25   Ct Head Wo Contrast  01/30/2016  CLINICAL DATA:  Confusion with gait disturbance EXAM: CT HEAD WITHOUT CONTRAST TECHNIQUE: Contiguous axial images were obtained from the base of the skull through the vertex without intravenous contrast. COMPARISON:  December 31, 2015 FINDINGS: Mild to moderate diffuse atrophy is stable. There is no intracranial mass, hemorrhage, extra-axial fluid collection, or midline shift. Patchy small vessel disease in the centra semiovale bilaterally is stable. There is evidence of a prior small infarct in the posterior aspect of the posterior limb of the left internal capsule, stable. There is no new gray-white compartment lesion. No acute infarct evident. The bony calvarium appears intact. The mastoid air cells are clear. Visualized orbits appear symmetric bilaterally. IMPRESSION: Atrophy with periventricular small vessel disease. Prior small infarct in the posterior aspect of the posterior limb of the internal capsule on the left, stable. No acute infarct evident. No hemorrhage or mass effect. Electronically Signed   By: Lowella Grip III M.D.   On: 01/30/2016 16:27   Mr Brain Wo Contrast (neuro Protocol)  01/30/2016  CLINICAL DATA:  Confusion and unsteady gait. Personal history of colon cancer.  EXAM: MRI HEAD WITHOUT CONTRAST TECHNIQUE: Multiplanar, multiecho pulse sequences of the brain and surrounding structures were obtained without intravenous contrast. COMPARISON:  MRI brain and MRA head 12/27/2015. CT head without contrast 01/30/2016. FINDINGS: In addition to the multiple punctate previously-seen bilateral cerebral and cerebellar infarcts, several new punctate foci of restricted diffusion in acute infarction are noted. Most prominent areas are in the left basal ganglia and right caudate head. There is a punctate focus the anterior right frontal lobe and medial right parietal lobe. New bilateral punctate lesions are present in the occipital poles and the posterior right insular cortex. There are new foci of acute nonhemorrhagic infarction involving the medial posterior temporal lobes and bilateral cerebellum, left greater than right. These white matter changes are associated with these lesions in addition to multiple other more remote ischemic lesions bilaterally. Moderate generalized atrophy is again noted. The ventricles are proportionate to the degree of atrophy. The internal auditory canals are within normal limits bilaterally. Flow is present in the major intracranial arteries. Bilateral lens replacements are present. Polyps or mucous retention cysts are noted along the inferior aspect of the right maxillary sinus without change. The paranasal sinuses are otherwise clear. There is some fluid in the mastoid air cells bilaterally. No obstructing nasopharyngeal lesion is evident. IMPRESSION: 1. Multiple additional acute nonhemorrhagic punctate acute infarcts throughout both cerebral hemispheres and cerebellum. Watershed infarcts are previously suggested. These may again be watershed infarcts. Multiple embolic foci from a cardiac arrhythmia is also considered. 2. Multiple previous subacute infarcts are again seen. 3. Extensive white matter disease is present in both hemispheres. 4. Multiple remote  lacunar infarcts are also noted, stable. 5. Right maxillary sinus disease is stable. Electronically Signed   By: San Morelle M.D.   On: 01/30/2016 18:30   Old chart reviewed ekg reviewed sinus brady with pvc cxr reviewed no edema or infiltrate Case discussed w dr Elise Benne  Assessment/Plan  82 yo female with acute confusion with more watershed infarcts on mri with h/o pafib on aspirin  Principal Problem:   Stroke [facility])- will place on heparin gtt.  Unclear as to why she didn't have TEE last month, will consult cardiology for consideration of TEE.  Will not repeat work up as pt just had full  evaluation within the last month.  Unclear if family made a decision not to proceed with TEE however she did have heart cath.  Discuss with family in the am.  Active Problems:   Mixed hyperlipidemia- cont statin   Essential hypertension, benign- stable   Type 2 diabetes mellitus ([place])- ssi   Gait abnormality- noted   CHF (congestive heart failure) ([place])- compensated and stable   Legal blindness- noted   PAF (paroxysmal atrial fibrillation) ([place])- sinus bradycardia now   Vascular dementia- noted  Admit to telemetry floor.  Presumptive full code.  Daviona Herbert A 01/30/2016, 8:23 PM

## 2016-01-30 NOTE — ED Provider Notes (Signed)
CSN: [id number]     Arrival date & time 01/30/16  1518 History   First MD Initiated Contact with Patient 01/30/16 1525     Chief Complaint  Patient presents with  . Gait Problem  . Altered Mental Status      Patient is a 82 y.o. female presenting with altered mental status. The history is provided by the patient and a relative. The history is limited by the condition of the patient (confusion).  Altered Mental Status Pt was seen at 1525. Per pt's family: Pt's family states pt became more confused than usual and "unsteady on her feet" since 1900 last night. Pt states she just feels her "legs are weak" otherwise denies CP/palpitations, no SOB/cough, no abd pain, no N/V/D, no change in pt's usual "black stools because she's on iron," no fevers, no rash, no focal motor weakness, no facial droop, no slurred speech. Pt ate breakfast this morning without choking/gagging or vomiting.   Past Medical History  Diagnosis Date  . Type 2 diabetes mellitus ([place])   . Colon cancer ([place])   . Anemia   . Essential hypertension, benign   . Mixed hyperlipidemia   . Macular degeneration   . History of pneumonia   . Iron deficiency anemia     Erosive antral gastritis  . Cognitive impairment    Past Surgical History  Procedure Laterality Date  . Cataract extraction    . Cholecystectomy    . Appendectomy    . Right knee surgery    . Colon surgery for colon cancer      1992 by Dr Mendel Ryder  . Tubal ligation    . Colonoscopy  03/24/2012    Procedure: COLONOSCOPY;  Surgeon: Rogene Houston, MD;  [facility];  Service: Endoscopy;  Laterality: N/A;  1200  . Esophagogastroduodenoscopy  08/04/2012    Procedure: ESOPHAGOGASTRODUODENOSCOPY (EGD);  Surgeon: Rogene Houston, MD;  Location: [facility];  Service: Endoscopy;  Laterality: N/A;  325  . Cardiac catheterization N/A 01/04/2016    Procedure: Left Heart Cath and Coronary Angiography;  Surgeon: Leonie Man, MD;  Location: [facility];   Service: Cardiovascular;  Laterality: N/A;   Family History  Problem Relation Age of Onset  . COPD Mother   . Alzheimer's disease Mother   . Congestive Heart Failure Mother   . Diabetes Brother    Social History  Substance Use Topics  . Smoking status: Never Smoker   . Smokeless tobacco: Never Used  . Alcohol Use: No    Review of Systems  Unable to perform ROS: Mental status change      Allergies  Codeine  Home Medications   Prior to Admission medications   Medication Sig Start Date End Date Taking? Authorizing Provider  alendronate (FOSAMAX) 70 MG tablet Take 70 mg by mouth every 7 (seven) days. Take with a full glass of water on an empty stomach. - on Fridays    Historical Provider, MD  aspirin 325 MG tablet Take 1 tablet (325 mg total) by mouth daily. 01/05/16   Reyne Dumas, MD  atorvastatin (LIPITOR) 10 MG tablet Take 10 mg by mouth daily.    Historical Provider, MD  cholestyramine Lucrezia Starch) 4 G packet Take 4 g by mouth daily. Mix one packet in liquid and drink    Historical Provider, MD  citalopram (CELEXA) 40 MG tablet Take 1 tablet (40 mg total) by mouth daily. 08/31/14   Melvenia Beam, MD  Cyanocobalamin (VITAMIN  B-12) 2500 MCG SUBL Place 2,500 mcg under the tongue daily.     Historical Provider, MD  ergocalciferol (VITAMIN D2) 50000 UNITS capsule Take 50,000 Units by mouth once a week. On Sundays    Historical Provider, MD  ferrous sulfate 325 (65 FE) MG tablet Take 325 mg by mouth daily with breakfast.    Historical Provider, MD  OLANZapine (ZYPREXA) 2.5 MG tablet Take 1 tablet (2.5 mg total) by mouth at bedtime. 01/06/16   Reyne Dumas, MD  pioglitazone (ACTOS) 30 MG tablet Take 15 mg by mouth daily.     Historical Provider, MD   BP 135/43 mmHg  Pulse 68  Temp(Src) 98.1 F (36.7 C) (Oral)  Resp 18  Ht 5\' 2"  (1.575 m)  Wt 142 lb (64.411 kg)  BMI 25.97 kg/m2  SpO2 94%   16:37:07 Orthostatic Vital Signs JC  Orthostatic Lying  - BP- Lying: 132/52 mmHg ;  Pulse- Lying: 48  Orthostatic Sitting - BP- Sitting: 135/62 mmHg ; Pulse- Sitting: 51  Orthostatic Standing at 0 minutes - BP- Standing at 0 minutes: 124/58 mmHg ; Pulse- Standing at 0 minutes: 51      Physical Exam  1530: Physical examination:  Nursing notes reviewed; Vital signs and O2 SAT reviewed;  Constitutional: Well developed, Well nourished, Well hydrated, In no acute distress; Head:  Normocephalic, atraumatic; Eyes: EOMI, PERRL, No scleral icterus; ENMT: Mouth and pharynx normal, Mucous membranes moist; Neck: Supple, Full range of motion, No lymphadenopathy; Cardiovascular: Regular rate and rhythm, No gallop; Respiratory: Breath sounds clear & equal bilaterally, No wheezes.  Speaking full sentences with ease, Normal respiratory effort/excursion; Chest: Nontender, Movement normal; Abdomen: Soft, Nontender, Nondistended, Normal bowel sounds; Genitourinary: No CVA tenderness; Extremities: Pulses normal, No tenderness, No edema, No calf edema or asymmetry.; Neuro: Awake, alert, confused re: events. Legally blind per hx. Talkative in clear voice w/staff and family.  No facial droop. Grips equal. Strength 5/5 equal bilat UE's and LE's.  DTR 2/4 equal bilat UE's and LE's.  No gross sensory deficits.  Normal cerebellar testing bilat UE's (finger-nose) and LE's (heel-shin). ..; Skin: Color pale, Warm, Dry.   ED Course  Procedures (including critical care time) Labs Review   Imaging Review  I have personally reviewed and evaluated these images and lab results as part of my medical decision-making.   EKG Interpretation   Date/Time:  Wednesday Jan 30 2016 15:39:33 EDT Ventricular Rate:  48 PR Interval:    QRS Duration: 134 QT Interval:  632 QTC Calculation: 565 R Axis:   -81 Text Interpretation:  Sinus bradycardia Multiple ventricular premature  complexes Left axis deviation RBBB and LAFB Abnormal T, consider ischemia,  lateral leads T wave abnormality Inferior leads When compared with  ECG of  12/29/2015 T wave abnormality Inferior leads and Lateral leads is now  Present Confirmed by [facility]  MD, Nunzio Cory [phone]) on 01/30/2016 3:59:29 PM      MDM  MDM Reviewed: previous chart, nursing note and vitals Reviewed previous: labs and ECG Interpretation: labs, ECG, x-ray and CT scan   Results for orders placed or performed during the hospital encounter of 01/30/16  Comprehensive metabolic panel  Result Value Ref Range   Sodium 133 (L) 135 - 145 mmol/L   Potassium 4.0 3.5 - 5.1 mmol/L   Chloride 102 101 - 111 mmol/L   CO2 23 22 - 32 mmol/L   Glucose, Bld 136 (H) 65 - 99 mg/dL   BUN 26 (H) 6 - 20 mg/dL  Creatinine, Ser 1.53 (H) 0.44 - 1.00 mg/dL   Calcium 8.6 (L) 8.9 - 10.3 mg/dL   Total Protein 6.8 6.5 - 8.1 g/dL   Albumin 3.6 3.5 - 5.0 g/dL   AST 21 15 - 41 U/L   ALT 12 (L) 14 - 54 U/L   Alkaline Phosphatase 71 38 - 126 U/L   Total Bilirubin 0.6 0.3 - 1.2 mg/dL   GFR calc non Af Amer 30 (L) >60 mL/min   GFR calc Af Amer 35 (L) >60 mL/min   Anion gap 8 5 - 15  Troponin I  Result Value Ref Range   Troponin I 3.67 (HH) <0.031 ng/mL  CBC with Differential  Result Value Ref Range   WBC 7.7 4.0 - 10.5 K/uL   RBC 3.71 (L) 3.87 - 5.11 MIL/uL   Hemoglobin 10.7 (L) 12.0 - 15.0 g/dL   HCT 32.9 (L) 36.0 - 46.0 %   MCV 88.7 78.0 - 100.0 fL   MCH 28.8 26.0 - 34.0 pg   MCHC 32.5 30.0 - 36.0 g/dL   RDW 14.7 11.5 - 15.5 %   Platelets 158 150 - 400 K/uL   Neutrophils Relative % 63 %   Neutro Abs 4.9 1.7 - 7.7 K/uL   Lymphocytes Relative 23 %   Lymphs Abs 1.8 0.7 - 4.0 K/uL   Monocytes Relative 14 %   Monocytes Absolute 1.1 (H) 0.1 - 1.0 K/uL   Eosinophils Relative 0 %   Eosinophils Absolute 0.0 0.0 - 0.7 K/uL   Basophils Relative 0 %   Basophils Absolute 0.0 0.0 - 0.1 K/uL  Magnesium  Result Value Ref Range   Magnesium 1.8 1.7 - 2.4 mg/dL   Dg Chest 2 View 01/30/2016  CLINICAL DATA:  Confusion EXAM: CHEST  2 VIEW COMPARISON:  January 06, 2016 FINDINGS: There is no  edema or consolidation. Heart is mildly enlarged with pulmonary vascularity within normal limits. There is calcification in the mitral annulus. No adenopathy. Bones are osteoporotic. There is degenerative change in the thoracic spine. IMPRESSION: Persistent mild cardiac enlargement. No edema or consolidation. Bones osteoporotic. Electronically Signed   By: Lowella Grip III M.D.   On: 01/30/2016 16:25   Ct Head Wo Contrast 01/30/2016  CLINICAL DATA:  Confusion with gait disturbance EXAM: CT HEAD WITHOUT CONTRAST TECHNIQUE: Contiguous axial images were obtained from the base of the skull through the vertex without intravenous contrast. COMPARISON:  December 31, 2015 FINDINGS: Mild to moderate diffuse atrophy is stable. There is no intracranial mass, hemorrhage, extra-axial fluid collection, or midline shift. Patchy small vessel disease in the centra semiovale bilaterally is stable. There is evidence of a prior small infarct in the posterior aspect of the posterior limb of the left internal capsule, stable. There is no new gray-white compartment lesion. No acute infarct evident. The bony calvarium appears intact. The mastoid air cells are clear. Visualized orbits appear symmetric bilaterally. IMPRESSION: Atrophy with periventricular small vessel disease. Prior small infarct in the posterior aspect of the posterior limb of the internal capsule on the left, stable. No acute infarct evident. No hemorrhage or mass effect. Electronically Signed   By: Lowella Grip III M.D.   On: 01/30/2016 16:27   Mr Brain Wo Contrast (neuro Protocol) 01/30/2016  CLINICAL DATA:  Confusion and unsteady gait. Personal history of colon cancer. EXAM: MRI HEAD WITHOUT CONTRAST TECHNIQUE: Multiplanar, multiecho pulse sequences of the brain and surrounding structures were obtained without intravenous contrast. COMPARISON:  MRI brain and MRA head 12/27/2015.  CT head without contrast 01/30/2016. FINDINGS: In addition to the multiple  punctate previously-seen bilateral cerebral and cerebellar infarcts, several new punctate foci of restricted diffusion in acute infarction are noted. Most prominent areas are in the left basal ganglia and right caudate head. There is a punctate focus the anterior right frontal lobe and medial right parietal lobe. New bilateral punctate lesions are present in the occipital poles and the posterior right insular cortex. There are new foci of acute nonhemorrhagic infarction involving the medial posterior temporal lobes and bilateral cerebellum, left greater than right. These white matter changes are associated with these lesions in addition to multiple other more remote ischemic lesions bilaterally. Moderate generalized atrophy is again noted. The ventricles are proportionate to the degree of atrophy. The internal auditory canals are within normal limits bilaterally. Flow is present in the major intracranial arteries. Bilateral lens replacements are present. Polyps or mucous retention cysts are noted along the inferior aspect of the right maxillary sinus without change. The paranasal sinuses are otherwise clear. There is some fluid in the mastoid air cells bilaterally. No obstructing nasopharyngeal lesion is evident. IMPRESSION: 1. Multiple additional acute nonhemorrhagic punctate acute infarcts throughout both cerebral hemispheres and cerebellum. Watershed infarcts are previously suggested. These may again be watershed infarcts. Multiple embolic foci from a cardiac arrhythmia is also considered. 2. Multiple previous subacute infarcts are again seen. 3. Extensive white matter disease is present in both hemispheres. 4. Multiple remote lacunar infarcts are also noted, stable. 5. Right maxillary sinus disease is stable. Electronically Signed   By: San Morelle M.D.   On: 01/30/2016 18:30    Results for KATHRINE, KRAMMES (MRN [id number]) as of 01/30/2016 17:20  Ref. Range 01/03/2016 09:07 01/04/2016 04:50 01/05/2016  04:30 01/06/2016 06:07 01/30/2016 15:35  BUN Latest Ref Range: 6-20 mg/dL 16 20 13 12 26  (H)  Creatinine Latest Ref Range: 0.44-1.00 mg/dL 1.26 (H) 1.30 (H) 1.20 (H) 1.17 (H) 1.53 (H)   Results for ANTONIQUE, ARMENDAREZ (MRN [id number]) as of 01/30/2016 17:20  Ref. Range 12/30/2015 17:52 12/30/2015 23:20 12/31/2015 06:15 12/31/2015 11:51 01/30/2016 15:35  Troponin I Latest Ref Range: <0.031 ng/mL 9.71 (HH) 9.47 (HH) 7.88 (HH) 6.43 (HH) 3.67 (HH)    1730:   T/C to [facility] Dr. Sallyanne Kuster, case discussed, including:  HPI, pertinent PM/SHx, VS/PE, dx testing, ED course and treatment: he has viewed the EKG from today, as well as pt's inpt records from last month (including cardiac cath), states pt's elevated troponins not due to cardiac issue, sometime RF (etc) can interfere with assay, pt can stay at [facility], avoid QT prolonging meds.   1915:  Pt ambulated to [facility] with shuffling gait. Dx and testing d/w pt and family.  Questions answered.  Verb understanding, agreeable to admit. T/C to [facility] Dr. Shanon Brow, case discussed, including:  HPI, pertinent PM/SHx, VS/PE, dx testing, ED course and treatment:  Agreeable to admit, requests to write temporary orders, obtain inpt tele bed to team [facility].   Francine Graven, DO 01/31/16 2025

## 2016-01-30 NOTE — ED Notes (Signed)
Pt returned from MRI °

## 2016-01-30 NOTE — ED Notes (Signed)
Pt up ambulatory with assistance to [facility].  Pt walked across room with a shuffled gait.

## 2016-01-31 ENCOUNTER — Encounter (HOSPITAL_COMMUNITY): Payer: Self-pay | Admitting: Cardiology

## 2016-01-31 DIAGNOSIS — I639 Cerebral infarction, unspecified: Secondary | ICD-10-CM

## 2016-01-31 DIAGNOSIS — I480 Paroxysmal atrial fibrillation: Secondary | ICD-10-CM

## 2016-01-31 DIAGNOSIS — I6300 Cerebral infarction due to thrombosis of unspecified precerebral artery: Secondary | ICD-10-CM

## 2016-01-31 DIAGNOSIS — I10 Essential (primary) hypertension: Secondary | ICD-10-CM

## 2016-01-31 LAB — CBC
HEMATOCRIT: 29.4 % — AB (ref 36.0–46.0)
Hemoglobin: 9.5 g/dL — ABNORMAL LOW (ref 12.0–15.0)
MCH: 29 pg (ref 26.0–34.0)
MCHC: 32.3 g/dL (ref 30.0–36.0)
MCV: 89.6 fL (ref 78.0–100.0)
PLATELETS: 137 10*3/uL — AB (ref 150–400)
RBC: 3.28 MIL/uL — ABNORMAL LOW (ref 3.87–5.11)
RDW: 14.3 % (ref 11.5–15.5)
WBC: 6.8 10*3/uL (ref 4.0–10.5)

## 2016-01-31 LAB — GLUCOSE, CAPILLARY
GLUCOSE-CAPILLARY: 137 mg/dL — AB (ref 65–99)
GLUCOSE-CAPILLARY: 152 mg/dL — AB (ref 65–99)
Glucose-Capillary: 115 mg/dL — ABNORMAL HIGH (ref 65–99)
Glucose-Capillary: 179 mg/dL — ABNORMAL HIGH (ref 65–99)

## 2016-01-31 LAB — LIPID PANEL
CHOL/HDL RATIO: 2.7 ratio
Cholesterol: 130 mg/dL (ref 0–200)
HDL: 49 mg/dL (ref >40)
LDL CALC: 72 mg/dL (ref 0–99)
TRIGLYCERIDES: 46 mg/dL (ref <150)
VLDL: 9 mg/dL (ref 0–40)

## 2016-01-31 LAB — HEPARIN LEVEL (UNFRACTIONATED): Heparin Unfractionated: 0.37 IU/mL (ref 0.30–0.70)

## 2016-01-31 LAB — CALCIUM, IONIZED: CALCIUM, IONIZED, SERUM: 5 mg/dL (ref 4.5–5.6)

## 2016-01-31 MED ORDER — OLANZAPINE 2.5 MG PO TABS
2.5000 mg | ORAL_TABLET | Freq: Every day | ORAL | Status: DC
  Administered 2016-01-31 – 2016-02-01 (×2): 2.5 mg via ORAL
  Filled 2016-01-31 (×2): qty 1

## 2016-01-31 MED ORDER — VITAMIN B-12 2500 MCG SL SUBL: 2500.0000 ug | SUBLINGUAL_TABLET | Freq: Every day | SUBLINGUAL | Status: DC

## 2016-01-31 MED ORDER — CHOLESTYRAMINE 4 G PO PACK
4.0000 g | PACK | Freq: Every day | ORAL | Status: DC
Start: 2016-01-31 — End: 2016-02-02
  Administered 2016-01-31 – 2016-02-02 (×2): 4 g via ORAL
  Filled 2016-01-31 (×3): qty 1

## 2016-01-31 MED ORDER — VITAMIN B-12 1000 MCG PO TABS
2000.0000 ug | ORAL_TABLET | Freq: Every day | ORAL | Status: DC
  Administered 2016-01-31 – 2016-02-02 (×2): 2000 ug via ORAL
  Filled 2016-01-31 (×2): qty 2

## 2016-01-31 MED ORDER — ASPIRIN 81 MG PO CHEW
81.0000 mg | CHEWABLE_TABLET | Freq: Every day | ORAL | Status: DC
  Administered 2016-02-01 – 2016-02-02 (×2): 81 mg via ORAL
  Filled 2016-01-31 (×2): qty 1

## 2016-01-31 NOTE — Consult Note (Signed)
Requesting Physician: Dr.  Candiss Norse    Reason for consultation:  To manage acute embolic infarcts  HPI:                                                                                                                                         Laura Parks is an 82 y.o. female patient transferred from [facility] for further management of acute cerebral embolic infacrts noted on brain MRI. She has a questionable history of atrial fibrillation discovered month ago, per history from her daughter. She seen by cardiologist today and it is unclear if she actually had a documented A. fib or not, per cardiologist. Clinically, patient does not have any focal neurological symptoms, no new speech problems or focal sensory or motor symptoms. She does have poor visual activity at baseline and narrow visual field from macular degeneration which have been grossly unchanged. She was started on heparin drip at [facility] prior to transfer.    Stroke Risk Factors - atrial fibrillation  Past Medical History: Past Medical History  Diagnosis Date  . Type 2 diabetes mellitus ([place])   . Colon cancer ([place])   . Anemia   . Essential hypertension, benign   . Mixed hyperlipidemia   . Macular degeneration   . History of pneumonia   . Iron deficiency anemia     Erosive antral gastritis  . Cognitive impairment   . Coronary artery disease 01/04/2016    "Angiographically minimal coronary disease. No culprit lesion to explain significant elevation in troponin"  . CHF (congestive heart failure) ([place])   . Stroke ([place]) 01/2016    watershed infract  . Legal blindness     Past Surgical History  Procedure Laterality Date  . Cataract extraction    . Cholecystectomy    . Appendectomy    . Right knee surgery    . Colon surgery for colon cancer      1992 by Dr Mendel Ryder  . Tubal ligation    . Colonoscopy  03/24/2012    Procedure: COLONOSCOPY;  Surgeon: Rogene Houston, MD;  [facility];  Service:  Endoscopy;  Laterality: N/A;  1200  . Esophagogastroduodenoscopy  08/04/2012    Procedure: ESOPHAGOGASTRODUODENOSCOPY (EGD);  Surgeon: Rogene Houston, MD;  Location: [facility];  Service: Endoscopy;  Laterality: N/A;  325  . Cardiac catheterization N/A 01/04/2016    Procedure: Left Heart Cath and Coronary Angiography;  Surgeon: Leonie Man, MD;  Location: [place] [facility];  Service: Cardiovascular;  Laterality: N/A;    Family History: Family History  Problem Relation Age of Onset  . COPD Mother   . Alzheimer's disease Mother   . Congestive Heart Failure Mother   . Diabetes Brother     Social History:   reports that she has never smoked. She has never used smokeless tobacco. She reports that she does  not drink alcohol or use illicit drugs.  Allergies:  Allergies  Allergen Reactions  . Codeine Nausea Only     Medications:                                                                                                                         Current facility-administered medications:  .  0.9 %  sodium chloride infusion, 250 mL, Intravenous, PRN, Phillips Grout, MD .  Derrill Memo ON 02/01/2016] aspirin chewable tablet 81 mg, 81 mg, Oral, Daily, Thurnell Lose, MD .  atorvastatin (LIPITOR) tablet 10 mg, 10 mg, Oral, Daily, Phillips Grout, MD, 10 mg at 01/31/16 0920 .  cholestyramine (QUESTRAN) packet 4 g, 4 g, Oral, Daily, Thurnell Lose, MD, 4 g at 01/31/16 1442 .  citalopram (CELEXA) tablet 40 mg, 40 mg, Oral, Daily, Phillips Grout, MD, 40 mg at 01/31/16 0920 .  heparin ADULT infusion 100 units/mL (25000 units/250 mL), 1,150 Units/hr, Intravenous, Continuous, Phillips Grout, MD, Last Rate: 11.5 mL/hr at 01/31/16 1822, 1,150 Units/hr at 01/31/16 1822 .  insulin aspart (novoLOG) injection 0-9 Units, 0-9 Units, Subcutaneous, TID WC, Phillips Grout, MD, 2 Units at 01/31/16 1652 .  mirtazapine (REMERON) tablet 15 mg, 15 mg, Oral, QHS, Rachal A David, MD .  OLANZapine (ZYPREXA)  tablet 2.5 mg, 2.5 mg, Oral, QHS, Thurnell Lose, MD .  sodium chloride flush (NS) 0.9 % injection 3 mL, 3 mL, Intravenous, Q12H, Phillips Grout, MD .  sodium chloride flush (NS) 0.9 % injection 3 mL, 3 mL, Intravenous, PRN, Phillips Grout, MD .  vitamin B-12 (CYANOCOBALAMIN) tablet 2,000 mcg, 2,000 mcg, Oral, Daily, Thurnell Lose, MD, 2,000 mcg at 01/31/16 1826   ROS:                                                                                                                                       History unobtainable from patient due to mental status, dementia  Neurologic Examination:  Today's Vitals   01/31/16 1230 01/31/16 1618 01/31/16 1851 01/31/16 2000  BP: 118/46 154/60 151/74   Pulse: 54 54 56   Temp: 98.4 F (36.9 C) 98.4 F (36.9 C) 99 F (37.2 C)   TempSrc: Oral Oral Oral   Resp: 16 15 16    Height:      Weight:      SpO2: 95% 97% 98%   PainSc:    0-No pain    Evaluation of higher integrative functions including: Level of alertness: Alert,  Oriented to place and person, not to month or year  Speech: fluent, no evidence of dysarthria or aphasia noted.  Test the following cranial nerves: 2-12 grossly intact, except for baseline poor visual acuity in both eyes due to macular degeneration, able to identify objects and count fingers at 3 feet without difficulty. Motor examination: Normal tone, bulk, full 5/5 motor strength in all 4 extremities Examination of sensation : symmetric sensation in all 4 extremities and on face Examination of deep tendon reflexes: 2+, symmetric in all extremities, normal plantars bilaterally Test coordination:  no evidence of limb appendicular ataxia or abnormal involuntary movements or tremors noted.  Gait: Deferred    Lab Results: Basic Metabolic Panel:  Recent Labs Lab 01/30/16 1535  NA 133*  K 4.0  CL 102  CO2 23  GLUCOSE 136*   BUN 26*  CREATININE 1.53*  CALCIUM 8.6*  MG 1.8    Liver Function Tests:  Recent Labs Lab 01/30/16 1535  AST 21  ALT 12*  ALKPHOS 71  BILITOT 0.6  PROT 6.8  ALBUMIN 3.6   No results for input(s): LIPASE, AMYLASE in the last 168 hours. No results for input(s): AMMONIA in the last 168 hours.  CBC:  Recent Labs Lab 01/30/16 1535 01/31/16 0423  WBC 7.7 6.8  NEUTROABS 4.9  --   HGB 10.7* 9.5*  HCT 32.9* 29.4*  MCV 88.7 89.6  PLT 158 137*    Cardiac Enzymes:  Recent Labs Lab 01/30/16 1535  TROPONINI 3.67*    Lipid Panel:  Recent Labs Lab 01/31/16 0423  CHOL 130  TRIG 46  HDL 49  CHOLHDL 2.7  VLDL 9  LDLCALC 72    CBG:  Recent Labs Lab 01/31/16 0748 01/31/16 1144 01/31/16 1627  GLUCAP 137* 115* 179*    Microbiology: Results for orders placed or performed during the hospital encounter of 01/30/16  Urine culture     Status: None (Preliminary result)   Collection Time: 01/30/16  6:30 PM  Result Value Ref Range Status   Specimen Description URINE, CATHETERIZED  Final   Special Requests NONE  Final   Culture   Final    NO GROWTH < 24 HOURS Performed at [facility]    Report Status PENDING  Incomplete     Imaging: Dg Chest 2 View  01/30/2016  CLINICAL DATA:  Confusion EXAM: CHEST  2 VIEW COMPARISON:  January 06, 2016 FINDINGS: There is no edema or consolidation. Heart is mildly enlarged with pulmonary vascularity within normal limits. There is calcification in the mitral annulus. No adenopathy. Bones are osteoporotic. There is degenerative change in the thoracic spine. IMPRESSION: Persistent mild cardiac enlargement. No edema or consolidation. Bones osteoporotic. Electronically Signed   By: Lowella Grip III M.D.   On: 01/30/2016 16:25   Ct Head Wo Contrast  01/30/2016  CLINICAL DATA:  Confusion with gait disturbance EXAM: CT HEAD WITHOUT CONTRAST TECHNIQUE: Contiguous axial images were obtained from the base of the skull  through  the vertex without intravenous contrast. COMPARISON:  December 31, 2015 FINDINGS: Mild to moderate diffuse atrophy is stable. There is no intracranial mass, hemorrhage, extra-axial fluid collection, or midline shift. Patchy small vessel disease in the centra semiovale bilaterally is stable. There is evidence of a prior small infarct in the posterior aspect of the posterior limb of the left internal capsule, stable. There is no new gray-white compartment lesion. No acute infarct evident. The bony calvarium appears intact. The mastoid air cells are clear. Visualized orbits appear symmetric bilaterally. IMPRESSION: Atrophy with periventricular small vessel disease. Prior small infarct in the posterior aspect of the posterior limb of the internal capsule on the left, stable. No acute infarct evident. No hemorrhage or mass effect. Electronically Signed   By: Lowella Grip III M.D.   On: 01/30/2016 16:27   Mr Brain Wo Contrast (neuro Protocol)  01/30/2016  CLINICAL DATA:  Confusion and unsteady gait. Personal history of colon cancer. EXAM: MRI HEAD WITHOUT CONTRAST TECHNIQUE: Multiplanar, multiecho pulse sequences of the brain and surrounding structures were obtained without intravenous contrast. COMPARISON:  MRI brain and MRA head 12/27/2015. CT head without contrast 01/30/2016. FINDINGS: In addition to the multiple punctate previously-seen bilateral cerebral and cerebellar infarcts, several new punctate foci of restricted diffusion in acute infarction are noted. Most prominent areas are in the left basal ganglia and right caudate head. There is a punctate focus the anterior right frontal lobe and medial right parietal lobe. New bilateral punctate lesions are present in the occipital poles and the posterior right insular cortex. There are new foci of acute nonhemorrhagic infarction involving the medial posterior temporal lobes and bilateral cerebellum, left greater than right. These white matter changes are  associated with these lesions in addition to multiple other more remote ischemic lesions bilaterally. Moderate generalized atrophy is again noted. The ventricles are proportionate to the degree of atrophy. The internal auditory canals are within normal limits bilaterally. Flow is present in the major intracranial arteries. Bilateral lens replacements are present. Polyps or mucous retention cysts are noted along the inferior aspect of the right maxillary sinus without change. The paranasal sinuses are otherwise clear. There is some fluid in the mastoid air cells bilaterally. No obstructing nasopharyngeal lesion is evident. IMPRESSION: 1. Multiple additional acute nonhemorrhagic punctate acute infarcts throughout both cerebral hemispheres and cerebellum. Watershed infarcts are previously suggested. These may again be watershed infarcts. Multiple embolic foci from a cardiac arrhythmia is also considered. 2. Multiple previous subacute infarcts are again seen. 3. Extensive white matter disease is present in both hemispheres. 4. Multiple remote lacunar infarcts are also noted, stable. 5. Right maxillary sinus disease is stable. Electronically Signed   By: San Morelle M.D.   On: 01/30/2016 18:30     Stroke Scales   NIHSS :  0   Assessment and plan:   Laura Parks is an 82 y.o. female patient who is  transferred from [facility] for further management of acute cerebral embolic infacrts noted on brain MRI. She has a questionable history of atrial fibrillation discovered month ago, per history from her daughter. She seen by cardiologist today and it is unclear if she actually had a documented A. fib or not, per cardiologist. Clinically, patient does not have any focal neurological symptoms, no new speech problems or focal sensory or motor symptoms. She does have poor visual activity at baseline and narrow visual field from macular degeneration which have been grossly unchanged. Her neurological  examination is non  focal.  She was started on heparin drip at [facility] prior to transfer.   Although the diagnosis of atrial fibrillation is still questionable as per cardiology notes, she does have recurrent tiny embolic infarcts in the brain and multiple vascular territories suggestive of cardioembolic etiology, with paroxysmal atrial fibrillation being the most common cause. Agree with anticoagulation for stroke prevention. Currently on heparin drip. She likely a good candidate for Eliquis, 2.5 mg twice a day, accounting for her age.  Neurology stroke team will continue to follow signed tomorrow morning. Would defer to them to further discuss long-term anticoagulation treatment plan with the family.  Further stroke workup with echocardiogram, continue telemetry monitoring, cardiology to continue follow-up. Physical, occupational therapy follow-up.

## 2016-01-31 NOTE — Evaluation (Signed)
Occupational Therapy Evaluation Patient Details Name: Laura Parks MRN: [id number] DOB: 03/01/1930 Today's Date: 01/31/2016    History of Present Illness 82 yo female h/o a-fib, DM, dementia hospitalized last month at [facility] with a stroke/trop over 8 had heart cath that showed no significant CAD and MRI showing multiple infarcts placed on full dose of aspirin. Pt found to have more watershed infarcts on her MRI, referred for admission for further strokes in full dose of aspirin only.   Clinical Impression   Patient attempting to get out of bed upon therapy arrival. Patient agreeable to getting into recliner. No family present, so it's difficult to determine patient's baseline. Physically; from an OT standpoint; I do not see any significant weakness. Patient did require assistance to donn/doff socks. Pt preferred to hold onto therapist to transfer stating that she uses a walker sometimes at home. Due to patient's cognitive deficits I recommend that she have 24 hours supervision at home. No further OT services needed at this time.     Follow Up Recommendations  No OT follow up    Equipment Recommendations  Tub/shower seat       Precautions / Restrictions Precautions Precautions: Fall Restrictions Weight Bearing Restrictions: No      Mobility Bed Mobility Overal bed mobility: Modified Independent                Transfers Overall transfer level: Needs assistance Equipment used: 1 person hand held assist Transfers: Sit to/from Stand Sit to Stand: Min assist                   ADL Overall ADL's : Needs assistance/impaired                     Lower Body Dressing: Moderate assistance;Sitting/lateral leans   Toilet Transfer: Minimal assistance           Functional mobility during ADLs: Minimal assistance General ADL Comments: Patient fearful of falling during transfer. Requested to hold onto therapist.         Perception Perception Comments:  Difficult to assess perception due to legal blindness. Patient was able to locate coffee cup on tray table and was able to drink from cup as well as milk carton.       Pertinent Vitals/Pain Pain Assessment: No/denies pain     Hand Dominance Right   Extremity/Trunk Assessment Upper Extremity Assessment Upper Extremity Assessment: Overall WFL for tasks assessed   Lower Extremity Assessment Lower Extremity Assessment: Defer to PT evaluation       Communication Communication Communication: HOH   Cognition Arousal/Alertness: Awake/alert Behavior During Therapy: WFL for tasks assessed/performed;Flat affect Overall Cognitive Status: No family/caregiver present to determine baseline cognitive functioning                                Home Living Family/patient expects to be discharged to:: Private residence Living Arrangements: Spouse/significant other;Children Available Help at Discharge: Family;Available 24 hours/day Type of Home: House                       Home Equipment: Walker - 2 wheels          Prior Functioning/Environment Level of Independence:  (Unknown)        Comments: Patient is a poor historian due to Hx of dementia although states that she did not require assistance with completing ADL tasks.  End of Session Equipment Utilized During Treatment: Gait belt Nurse Communication: Mobility status  Activity Tolerance: Patient tolerated treatment well Patient left: in chair;with call bell/phone within reach;with chair alarm set   Time: 0830-0900 OT Time Calculation (min): 30 min Charges:  OT General Charges $OT Visit: 1 Procedure OT Evaluation $OT Eval Low Complexity: 1 Procedure G-Codes:    Ailene Ravel, OTR/L,CBIS  [phone]  01/31/2016, 9:08 AM

## 2016-01-31 NOTE — Progress Notes (Signed)
SLP Cancellation Note  Patient Details Name: Laura Parks MRN: [id number] DOB: 06-12-1930   Cancelled treatment:       Reason Eval/Treat Not Completed: Other (comment); Pt passed RN swallow and SLP attempted to see for SLE, however pt going to be transferred to [facility] shortly. Please consult SLP at [facility] if SLE indicated.  <<Embolic CVA with watershed infarct on MRI similar to recent Event few months ago. Patient on aspirin, I had detailed discussions with patient's daughter who is the primary decision maker, she confirms that she is willing or her mother to undergo further testing and anticoagulation if needed. She understands the risks and benefits. Patient at this time will be transferred to [facility] to be evaluated by stroke team and by cardiology. For now patient has been kept on IV heparin drip, I have discussed her case with neurologist Dr. Silverio Decamp who will see the patient upon arrival. She likely will require telemetry monitoring along with possible loop recorder placement or TEE before full anticoagulation.>>  Thank you,  Genene Churn, [place]  [place] 01/31/2016, 3:41 PM

## 2016-01-31 NOTE — Evaluation (Signed)
Physical Therapy Evaluation Patient Details Name: Laura Parks MRN: [id number] DOB: 1930/08/07 Today's Date: 01/31/2016   History of Present Illness  82 yo F admitted with increased confusion, and MRI showing acute watershet infarcts in the setting of CVA x 1 month ago.  PMH: PAF, DM, dementia, CVA, colon CA, anemia, HTN, CAD, R knee surgery, colon surgery, cholecystectomy, appendectomy.   Clinical Impression  Pt received sitting up in the chair, RN present, and pt is agreeable to PT evaluation.  No family present to confirm PLOF, however pt states that she lives in a 1 level home with 24/7 supervision from son/dtr/family.  Pt states she is independent with ADL's, and does not use any AD for ambulation.  Today, she required Min A for sit<>stand transfers, and ambulate 22ft with Min guard and RW.  Pt is highly distractable during gait, and perseverates on how "disgusting" the wall paint is.  Pt recommended for HHPT and continued 24/7 supervision at home.     Follow Up Recommendations Home health PT    Equipment Recommendations  Rolling walker with 5" wheels    Recommendations for Other Services       Precautions / Restrictions Restrictions Weight Bearing Restrictions: No      Mobility  Bed Mobility Overal bed mobility:  (Not assessed, pt already sitting up in the chair upon arrival. )                Transfers Overall transfer level: Needs assistance Equipment used: Rolling walker (2 wheeled) Transfers: Sit to/from Stand Sit to Stand: Min assist         General transfer comment: Pt requires vc's and tc's for hand placement.  x2 trials.  First trial without RW, and 2nd trial with RW.   Ambulation/Gait Ambulation/Gait assistance: Min guard Ambulation Distance (Feet): 40 Feet Assistive device: Rolling walker (2 wheeled) Gait Pattern/deviations: Shuffle;Trunk flexed     General Gait Details: Pt is easily distracted, and needed encouragement to ambulate into the hallway  with pt stating "That's disgusting"   Stairs            Wheelchair Mobility    Modified Rankin (Stroke Patients Only)       Balance Overall balance assessment: Needs assistance         Standing balance support: Bilateral upper extremity supported Standing balance-Leahy Scale: Fair                               Pertinent Vitals/Pain Pain Assessment: No/denies pain    Home Living Family/patient expects to be discharged to:: Private residence Living Arrangements: Spouse/significant other;Children (Son and dtr) Available Help at Discharge: Family;Available 24 hours/day Type of Home: House Home Access: Stairs to enter   [employer] of Steps: 1 Home Layout: One level Home Equipment: Shower seat Additional Comments: Pt tells me that she does not have any AD, however chart review states that she has a RW.     Prior Function           Comments: Pt states that she was independent with dressing and bathing, and that she does not use any DME for ambulation.       Hand Dominance   Dominant Hand: Right    Extremity/Trunk Assessment   Upper Extremity Assessment: Overall WFL for tasks assessed           Lower Extremity Assessment: Generalized weakness  Communication   Communication: HOH;Receptive difficulties  Cognition Arousal/Alertness: Awake/alert Behavior During Therapy: WFL for tasks assessed/performed Overall Cognitive Status: History of cognitive impairments - at baseline (Easily distracted during gait - pt perseverating on how "disgusting" everything looks - re: color of wall paint and floor tiles. )                      General Comments      Exercises        Assessment/Plan    PT Assessment Patient needs continued PT services  PT Diagnosis Difficulty walking;Abnormality of gait;Generalized weakness   PT Problem List Decreased strength;Decreased activity tolerance;Decreased balance;Decreased  mobility;Decreased coordination;Decreased cognition;Decreased knowledge of use of DME;Decreased safety awareness;Decreased knowledge of precautions;Obesity  PT Treatment Interventions Gait training;DME instruction;Functional mobility training;Therapeutic activities;Stair training;Therapeutic exercise;Balance training;Cognitive remediation;Patient/family education   PT Goals (Current goals can be found in the Care Plan section) Acute Rehab PT Goals Patient Stated Goal: Pt wants to go home.  PT Goal Formulation: With patient Time For Goal Achievement: 02/07/16 Potential to Achieve Goals: Good    Frequency Min 6X/week   Barriers to discharge        Co-evaluation               End of Session Equipment Utilized During Treatment: Gait belt Activity Tolerance: Patient tolerated treatment well Patient left: in chair;with chair alarm set Nurse Communication: Mobility status    Functional Assessment Tool Used: [employer] "6-clicks"  Functional Limitation: Mobility: Walking and moving around Mobility: Walking and Moving Around Current Status [phone]): At least 40 percent but less than 60 percent impaired, limited or restricted Mobility: Walking and Moving Around Goal Status [phone]): At least 20 percent but less than 40 percent impaired, limited or restricted    Time: 0922-0947 PT Time Calculation (min) (ACUTE ONLY): 25 min   Charges:   PT Evaluation $PT Eval Moderate Complexity: 1 Procedure PT Treatments $Gait Training: 8-22 mins   PT G Codes:   PT G-Codes **NOT FOR INPATIENT CLASS** Functional Assessment Tool Used: [employer] "6-clicks"  Functional Limitation: Mobility: Walking and moving around Mobility: Walking and Moving Around Current Status [phone]): At least 40 percent but less than 60 percent impaired, limited or restricted Mobility: Walking and Moving Around Goal Status [phone]): At least 20 percent but less than 40 percent impaired, limited or  restricted    Tacy Learn, PT, DPT X: [id number]   01/31/2016, 1:17 PM

## 2016-01-31 NOTE — Progress Notes (Signed)
Pt arrived to unit. Oriented and given call light. Placed on telemetry.

## 2016-01-31 NOTE — Progress Notes (Addendum)
PROGRESS NOTE                                                                                                                                                                                                             Patient Demographics:    Laura Parks, is a 82 y.o. female, DOB - 1930/06/08, HC:[id number]  Admit date - 01/30/2016   Admitting Physician Phillips Grout, MD  Outpatient Primary MD for the patient is Glenda Chroman, MD  LOS - 1   Chief Complaint  Patient presents with  . Gait Problem  . Altered Mental Status       Brief Narrative    82 year old pleasant Caucasian female with history of dementia, type 2 diabetes mellitus, Takotsubo-cardiomyopathy with troponin elevation in mid 75s recently with a negative left heart cath. Months ago, possible paroxysmal atrial fibrillation, history of watershed infarct, who lives at home with her husband was brought in last [facility] to [facility] with confusion and loss of balance, of note she has never had a fall. In the ER brain MRI showed new watershed infarct, with moderate elevation in troponin, she was placed on heparin drip and admitted to the hospitalist service.  In the past patient was not anticoagulated due to her dementia for her stroke, there was talk of doing a TEE or loop recorder to conform questionable paroxysmal atrial fibrillation but this was not done as it was thought that patient eventually will not be anticoagulated.  I had detailed discussions with patient's daughter who is the primary decision maker, she confirms that she is willing or her mother to undergo further testing and anticoagulation if needed. She understands the risks and benefits. Patient at this time will be transferred to [facility] to be evaluated by stroke team and by cardiology.   Subjective:    Laura Parks today has, No headache, No chest pain, No abdominal pain - No Nausea, No new  weakness tingling or numbness, No Cough - SOB.     Assessment  & Plan :     1.Embolic CVA with watershed infarct on MRI similar to recent Event few months ago. Patient on aspirin, I had detailed discussions with patient's daughter who is the primary decision maker, she confirms that she is willing or her mother to undergo further testing and anticoagulation if needed.  She understands the risks and benefits. Patient at this time will be transferred to [facility] to be evaluated by stroke team and by cardiology. For now patient has been kept on IV heparin drip, I have discussed her case with neurologist Dr. Silverio Decamp who will see the patient upon arrival. She likely will require telemetry monitoring along with possible loop recorder placement or TEE before full anticoagulation.   LDL is almost at goal, continue statin at present dose, A1c is acceptable for her age.  She recently had extensive stroke workup. Including unremarkable carotid duplex and echogram. We'll defer any new workup to cardiology and neurology.  Lab Results  Component Value Date   HGBA1C 6.6* 12/27/2015   Lab Results  Component Value Date   CHOL 130 01/31/2016   HDL 49 01/31/2016   LDLCALC 72 01/31/2016   TRIG 46 01/31/2016   CHOLHDL 2.7 01/31/2016     2.NSTEMI - chest pain free, EKG with no new changes, she had massive elevation of troponin last admission with a unremarkable left heart cath and a diagnosis of Takotsubo cardiomyopathy, now she will be on heparin drip along with low-dose aspirin, beta blocker and statin for secondary prevention. Cardiology to evaluate.  3. Questionable paroxysmal atrial fibrillation. [place] vasc 2 score will be greater than 6. This was mentioned in cardiology notes last admission however it was never confirmed, telemetry monitoring, beta blocker, may require loop recorder versus TEE to prove cardioembolic source of her stroke. We'll defer further workup to cardiology. Have discussed this with  cardiology triage nurse who will have cardiology see the patient at [facility]  4. Vascular dementia and gait abnormality. This is patient's baseline, however she had never had a fall I discussed this with her husband and daughter, supportive care with PT, at risk for delirium, minimize narcotics and benzodiazepines.  5. Dyslipidemia. On statin continue. LDL almost at goal, will not increase statin dose due to her age.  6. Hypertension. Home does beta blocker. Blood pressure stable. Strokes of watershed and not large infarcts.  7. CKD 4 - baseline creatinine close to 1.4. Monitor.   8. DM type II.   on sliding scale. A1c acceptable.  CBG (last 3)   Recent Labs  01/31/16 0748  GLUCAP 137*     Code Status :  Full  Family Communication  :  Discussed with husband and primary decision maker daughter [name] over her cellphone phone number [phone]  Disposition Plan  :  Transfer to [facility] for stroke workup, possible loop recorder was his TEE  Consults  : Discussed with both neurologist on call at [facility] along with the cardiology team.  Procedures  :    MRI brain confirming watershed infarct  DVT Prophylaxis  :  Heparin  gtt  Lab Results  Component Value Date   PLT 137* 01/31/2016    Inpatient Medications  Scheduled Meds: .  stroke: mapping our early stages of recovery book   Does not apply Once  . aspirin  300 mg Rectal Daily   Or  . aspirin  325 mg Oral Daily  . atorvastatin  10 mg Oral Daily  . citalopram  40 mg Oral Daily  . insulin aspart  0-9 Units Subcutaneous TID WC  . metoprolol succinate  50 mg Oral Daily  . mirtazapine  15 mg Oral QHS  . sodium chloride flush  3 mL Intravenous Q12H   Continuous Infusions: . heparin 1,150 Units/hr (01/30/16 2024)   PRN Meds:.sodium chloride,  sodium chloride flush  Antibiotics  :    Anti-infectives    None         Objective:   Filed Vitals:   01/30/16 2130 01/30/16 2134 01/30/16 2229 01/31/16 0513    BP: 136/55 136/55 148/57 143/53  Pulse:  51 51 55  Temp:   99.5 F (37.5 C) 99.5 F (37.5 C)  TempSrc:   Oral Axillary  Resp: 29 24 20 20   Height:   5\' 2"  (1.575 m)   Weight:   66.5 kg (146 lb 9.7 oz)   SpO2:  98% 99% 99%    Wt Readings from Last 3 Encounters:  01/30/16 66.5 kg (146 lb 9.7 oz)  01/06/16 66.906 kg (147 lb 8 oz)  08/31/14 57.153 kg (126 lb)    No intake or output data in the 24 hours ending 01/31/16 0925   Physical Exam  Awake however she is pleasantly confused, No new F.N deficits, Normal affect .AT,PERRAL Supple Neck,No JVD, No cervical lymphadenopathy appriciated.  Symmetrical Chest wall movement, Good air movement bilaterally, CTAB RRR,No Gallops,Rubs or new Murmurs, No Parasternal Heave +ve B.Sounds, Abd Soft, No tenderness, No organomegaly appriciated, No rebound - guarding or rigidity. No Cyanosis, Clubbing or edema, No new Rash or bruise      Data Review:    CBC  Recent Labs Lab 01/30/16 1535 01/31/16 0423  WBC 7.7 6.8  HGB 10.7* 9.5*  HCT 32.9* 29.4*  PLT 158 137*  MCV 88.7 89.6  MCH 28.8 29.0  MCHC 32.5 32.3  RDW 14.7 14.3  LYMPHSABS 1.8  --   MONOABS 1.1*  --   EOSABS 0.0  --   BASOSABS 0.0  --     Chemistries   Recent Labs Lab 01/30/16 1535  NA 133*  K 4.0  CL 102  CO2 23  GLUCOSE 136*  BUN 26*  CREATININE 1.53*  CALCIUM 8.6*  MG 1.8  AST 21  ALT 12*  ALKPHOS 71  BILITOT 0.6   ------------------------------------------------------------------------------------------------------------------  Recent Labs  01/31/16 0423  CHOL 130  HDL 49  LDLCALC 72  TRIG 46  CHOLHDL 2.7    Lab Results  Component Value Date   HGBA1C 6.6* 12/27/2015   ------------------------------------------------------------------------------------------------------------------ No results for input(s): TSH, T4TOTAL, T3FREE, THYROIDAB in the last 72 hours.  Invalid input(s):  FREET3 ------------------------------------------------------------------------------------------------------------------ No results for input(s): VITAMINB12, FOLATE, FERRITIN, TIBC, IRON, RETICCTPCT in the last 72 hours.  Coagulation profile  Recent Labs Lab 01/30/16 2000  INR 1.06    No results for input(s): DDIMER in the last 72 hours.  Cardiac Enzymes  Recent Labs Lab 01/30/16 1535  TROPONINI 3.67*   ------------------------------------------------------------------------------------------------------------------    Component Value Date/Time   BNP 252.5* 12/26/2015 0104    Micro Results No results found for this or any previous visit (from the past 240 hour(s)).  Radiology Reports Dg Chest 2 View  01/30/2016  CLINICAL DATA:  Confusion EXAM: CHEST  2 VIEW COMPARISON:  January 06, 2016 FINDINGS: There is no edema or consolidation. Heart is mildly enlarged with pulmonary vascularity within normal limits. There is calcification in the mitral annulus. No adenopathy. Bones are osteoporotic. There is degenerative change in the thoracic spine. IMPRESSION: Persistent mild cardiac enlargement. No edema or consolidation. Bones osteoporotic. Electronically Signed   By: Lowella Grip III M.D.   On: 01/30/2016 16:25   Dg Chest 2 View  01/06/2016  CLINICAL DATA:  Confusion. Slurred speech. Congestive heart failure. EXAM: CHEST  2 VIEW COMPARISON:  12/26/2015  FINDINGS: Cardiomegaly is stable. Decreased diffuse interstitial infiltrates are seen, consistent with resolving interstitial edema. No evidence of pulmonary consolidation. Tiny pleural effusions are noted posteriorly. IMPRESSION: Interval decrease in diffuse interstitial edema since prior study, consistent with resolving congestive heart failure. Electronically Signed   By: Earle Gell M.D.   On: 01/06/2016 12:36   Ct Head Wo Contrast  01/30/2016  CLINICAL DATA:  Confusion with gait disturbance EXAM: CT HEAD WITHOUT CONTRAST  TECHNIQUE: Contiguous axial images were obtained from the base of the skull through the vertex without intravenous contrast. COMPARISON:  December 31, 2015 FINDINGS: Mild to moderate diffuse atrophy is stable. There is no intracranial mass, hemorrhage, extra-axial fluid collection, or midline shift. Patchy small vessel disease in the centra semiovale bilaterally is stable. There is evidence of a prior small infarct in the posterior aspect of the posterior limb of the left internal capsule, stable. There is no new gray-white compartment lesion. No acute infarct evident. The bony calvarium appears intact. The mastoid air cells are clear. Visualized orbits appear symmetric bilaterally. IMPRESSION: Atrophy with periventricular small vessel disease. Prior small infarct in the posterior aspect of the posterior limb of the internal capsule on the left, stable. No acute infarct evident. No hemorrhage or mass effect. Electronically Signed   By: Lowella Grip III M.D.   On: 01/30/2016 16:27   Mr Brain Wo Contrast (neuro Protocol)  01/30/2016  CLINICAL DATA:  Confusion and unsteady gait. Personal history of colon cancer. EXAM: MRI HEAD WITHOUT CONTRAST TECHNIQUE: Multiplanar, multiecho pulse sequences of the brain and surrounding structures were obtained without intravenous contrast. COMPARISON:  MRI brain and MRA head 12/27/2015. CT head without contrast 01/30/2016. FINDINGS: In addition to the multiple punctate previously-seen bilateral cerebral and cerebellar infarcts, several new punctate foci of restricted diffusion in acute infarction are noted. Most prominent areas are in the left basal ganglia and right caudate head. There is a punctate focus the anterior right frontal lobe and medial right parietal lobe. New bilateral punctate lesions are present in the occipital poles and the posterior right insular cortex. There are new foci of acute nonhemorrhagic infarction involving the medial posterior temporal lobes and  bilateral cerebellum, left greater than right. These white matter changes are associated with these lesions in addition to multiple other more remote ischemic lesions bilaterally. Moderate generalized atrophy is again noted. The ventricles are proportionate to the degree of atrophy. The internal auditory canals are within normal limits bilaterally. Flow is present in the major intracranial arteries. Bilateral lens replacements are present. Polyps or mucous retention cysts are noted along the inferior aspect of the right maxillary sinus without change. The paranasal sinuses are otherwise clear. There is some fluid in the mastoid air cells bilaterally. No obstructing nasopharyngeal lesion is evident. IMPRESSION: 1. Multiple additional acute nonhemorrhagic punctate acute infarcts throughout both cerebral hemispheres and cerebellum. Watershed infarcts are previously suggested. These may again be watershed infarcts. Multiple embolic foci from a cardiac arrhythmia is also considered. 2. Multiple previous subacute infarcts are again seen. 3. Extensive white matter disease is present in both hemispheres. 4. Multiple remote lacunar infarcts are also noted, stable. 5. Right maxillary sinus disease is stable. Electronically Signed   By: San Morelle M.D.   On: 01/30/2016 18:30    Time Spent in minutes  30   Shelley Cocke K M.D on 01/31/2016 at 9:25 AM  Between 7am to 7pm - Pager - [phone]  After 7pm go to [email or web address] - password [email or web address]  [facility] -  Office  [phone]

## 2016-01-31 NOTE — Consult Note (Signed)
Reason for Consult: NSTEMI  Referring Physician: Dr. Candiss Norse   PCP:  Glenda Chroman, MD  Primary Cardiologist: Dr. Gwen Her in [place] Laura Parks is an 82 y.o. female.    Chief Complaint: admitted 11/30/15 for CVA    HPI: 82 year old female was recently seen for elevated troponin and possible CVA with admit 12/27/15 with pk troponin 38.35.  At that time the head CT without CVA though MRI with "Multifocal areas of acute nonhemorrhagic infarction, subcentimeter in size of a symmetric nature affecting the cortex, subcortical white matter, basal ganglia, and cerebellum. These are most consistent with watershed infarcts, commonly associated with cardiac arrhythmia or hypotensive event "  Her cardiac cath was with minimal disease.  Echo with EF 60-65% G1DD severely calcified aortic annulus with mild stenosis, mild MR.  No DVT of lower ext and 1-39% Rt ICA stenosis.   She was discharged with ASA.  There was a question of a fib on admit EKG.   TEE was not done due to risk of fall in blind pt.  If she were not a candidate for anticoagulation then it was decided not to pursue TEE or loop recorder.    Other hx diabetes mellitus, colon cancer, hypertension, hyperlipidemia and macular degeneration with blindness.   Now admitted 01/30/16 for confusion.  She has hx of mild dementia.  On CT of head no acute infarcts but prior infarcts.  MRI with new acute infarcts.  Now troponin elevated 3.67.  Lipids with LDL 72 on statin.   EKG on admit S brady at 48, with PVCs and LAFB and RBBB.  HR has improved now- her toprol was held but this had been stopped with last discharge.  But pt seems to have continued to take.  Pt currently without pain.  She thinks she may have had some earlier but does not know where.  She is mildly agitated currently- she states "she has been through hell"   At [facility] pt's daughter had stated she is willing to have the pt on anticoagulation.  She has not fallen or if she  seemed she might someone prevented the fall.      Past Medical History  Diagnosis Date  . Type 2 diabetes mellitus ([place])   . Colon cancer ([place])   . Anemia   . Essential hypertension, benign   . Mixed hyperlipidemia   . Macular degeneration   . History of pneumonia   . Iron deficiency anemia     Erosive antral gastritis  . Cognitive impairment   . Coronary artery disease 01/04/2016    "Angiographically minimal coronary disease. No culprit lesion to explain significant elevation in troponin"    Past Surgical History  Procedure Laterality Date  . Cataract extraction    . Cholecystectomy    . Appendectomy    . Right knee surgery    . Colon surgery for colon cancer      1992 by Dr Mendel Ryder  . Tubal ligation    . Colonoscopy  03/24/2012    Procedure: COLONOSCOPY;  Surgeon: Rogene Houston, MD;  [facility];  Service: Endoscopy;  Laterality: N/A;  1200  . Esophagogastroduodenoscopy  08/04/2012    Procedure: ESOPHAGOGASTRODUODENOSCOPY (EGD);  Surgeon: Rogene Houston, MD;  Location: [facility];  Service: Endoscopy;  Laterality: N/A;  325  . Cardiac catheterization N/A 01/04/2016    Procedure: Left Heart Cath and Coronary Angiography;  Surgeon:  Leonie Man, MD;  Location: [place] [facility];  Service: Cardiovascular;  Laterality: N/A;    Family History  Problem Relation Age of Onset  . COPD Mother   . Alzheimer's disease Mother   . Congestive Heart Failure Mother   . Diabetes Brother    Social History:  reports that she has never smoked. She has never used smokeless tobacco. She reports that she does not drink alcohol or use illicit drugs.  Allergies:  Allergies  Allergen Reactions  . Codeine Nausea Only   OUTPATIENT MEDICATIONS: No current facility-administered medications on file prior to encounter.   Current Outpatient Prescriptions on File Prior to Encounter  Medication Sig Dispense Refill  . aspirin 325 MG tablet Take 1 tablet (325 mg total) by mouth  daily. 30 tablet 0  . atorvastatin (LIPITOR) 10 MG tablet Take 10 mg by mouth daily.    . cholestyramine (QUESTRAN) 4 G packet Take 4 g by mouth daily. Mix one packet in liquid and drink    . citalopram (CELEXA) 40 MG tablet Take 1 tablet (40 mg total) by mouth daily. 30 tablet 6  . Cyanocobalamin (VITAMIN B-12) 2500 MCG SUBL Place 2,500 mcg under the tongue daily.     . ferrous sulfate 325 (65 FE) MG tablet Take 325 mg by mouth daily with breakfast.    . alendronate (FOSAMAX) 70 MG tablet Take 70 mg by mouth every 7 (seven) days. Take with a full glass of water on an empty stomach. - on Fridays    . OLANZapine (ZYPREXA) 2.5 MG tablet Take 1 tablet (2.5 mg total) by mouth at bedtime. (Patient not taking: Reported on 01/30/2016) 30 tablet 0     Results for orders placed or performed during the hospital encounter of 01/30/16 (from the past 48 hour(s))  Comprehensive metabolic panel     Status: Abnormal   Collection Time: 01/30/16  3:35 PM  Result Value Ref Range   Sodium 133 (L) 135 - 145 mmol/L   Potassium 4.0 3.5 - 5.1 mmol/L   Chloride 102 101 - 111 mmol/L   CO2 23 22 - 32 mmol/L   Glucose, Bld 136 (H) 65 - 99 mg/dL   BUN 26 (H) 6 - 20 mg/dL   Creatinine, Ser 1.53 (H) 0.44 - 1.00 mg/dL   Calcium 8.6 (L) 8.9 - 10.3 mg/dL   Total Protein 6.8 6.5 - 8.1 g/dL   Albumin 3.6 3.5 - 5.0 g/dL   AST 21 15 - 41 U/L   ALT 12 (L) 14 - 54 U/L   Alkaline Phosphatase 71 38 - 126 U/L   Total Bilirubin 0.6 0.3 - 1.2 mg/dL   GFR calc non Af Amer 30 (L) >60 mL/min   GFR calc Af Amer 35 (L) >60 mL/min    Comment: (NOTE) The eGFR has been calculated using the CKD EPI equation. This calculation has not been validated in all clinical situations. eGFR's persistently <60 mL/min signify possible Chronic Kidney Disease.    Anion gap 8 5 - 15  Troponin I     Status: Abnormal   Collection Time: 01/30/16  3:35 PM  Result Value Ref Range   Troponin I 3.67 (HH) <0.031 ng/mL    Comment: CRITICAL RESULT CALLED  TO, READ BACK BY AND VERIFIED WITH: CRUISE,JJ ON 01/30/16 AT 1655 BY LOY,C   CBC with Differential     Status: Abnormal   Collection Time: 01/30/16  3:35 PM  Result Value Ref Range   WBC 7.7  4.0 - 10.5 K/uL   RBC 3.71 (L) 3.87 - 5.11 MIL/uL   Hemoglobin 10.7 (L) 12.0 - 15.0 g/dL   HCT 32.9 (L) 36.0 - 46.0 %   MCV 88.7 78.0 - 100.0 fL   MCH 28.8 26.0 - 34.0 pg   MCHC 32.5 30.0 - 36.0 g/dL   RDW 14.7 11.5 - 15.5 %   Platelets 158 150 - 400 K/uL   Neutrophils Relative % 63 %   Neutro Abs 4.9 1.7 - 7.7 K/uL   Lymphocytes Relative 23 %   Lymphs Abs 1.8 0.7 - 4.0 K/uL   Monocytes Relative 14 %   Monocytes Absolute 1.1 (H) 0.1 - 1.0 K/uL   Eosinophils Relative 0 %   Eosinophils Absolute 0.0 0.0 - 0.7 K/uL   Basophils Relative 0 %   Basophils Absolute 0.0 0.0 - 0.1 K/uL  Magnesium     Status: None   Collection Time: 01/30/16  3:35 PM  Result Value Ref Range   Magnesium 1.8 1.7 - 2.4 mg/dL  Calcium, ionized     Status: None   Collection Time: 01/30/16  3:35 PM  Result Value Ref Range   Calcium, Ionized, Serum 5.0 4.5 - 5.6 mg/dL    Comment: (NOTE) Performed At: [facility] [place], [place] [id number] Lindon Romp MD [id number]   Urinalysis, Routine w reflex microscopic     Status: Abnormal   Collection Time: 01/30/16  6:30 PM  Result Value Ref Range   Color, Urine YELLOW YELLOW   APPearance CLEAR CLEAR   Specific Gravity, Urine 1.015 1.005 - 1.030   pH 5.5 5.0 - 8.0   Glucose, UA NEGATIVE NEGATIVE mg/dL   Hgb urine dipstick NEGATIVE NEGATIVE   Bilirubin Urine NEGATIVE NEGATIVE   Ketones, ur NEGATIVE NEGATIVE mg/dL   Protein, ur TRACE (A) NEGATIVE mg/dL   Nitrite NEGATIVE NEGATIVE   Leukocytes, UA TRACE (A) NEGATIVE  Urine culture     Status: None (Preliminary result)   Collection Time: 01/30/16  6:30 PM  Result Value Ref Range   Specimen Description URINE, CATHETERIZED    Special Requests NONE    Culture      NO GROWTH < 24  HOURS Performed at [facility]    Report Status PENDING   Urine microscopic-add on     Status: Abnormal   Collection Time: 01/30/16  6:30 PM  Result Value Ref Range   Squamous Epithelial / LPF 0-5 (A) NONE SEEN   WBC, UA 0-5 0 - 5 WBC/hpf   RBC / HPF 0-5 0 - 5 RBC/hpf   Bacteria, UA MANY (A) NONE SEEN   Casts HYALINE CASTS (A) NEGATIVE    Comment: GRANULAR CAST  Protime-INR     Status: None   Collection Time: 01/30/16  8:00 PM  Result Value Ref Range   Prothrombin Time 14.0 11.6 - 15.2 seconds   INR 1.06 0.00 - 1.49  APTT     Status: None   Collection Time: 01/30/16  8:00 PM  Result Value Ref Range   aPTT 30 24 - 37 seconds  Heparin level (unfractionated)     Status: None   Collection Time: 01/31/16  4:22 AM  Result Value Ref Range   Heparin Unfractionated 0.37 0.30 - 0.70 IU/mL    Comment:        IF HEPARIN RESULTS ARE BELOW EXPECTED VALUES, AND PATIENT DOSAGE HAS BEEN CONFIRMED, SUGGEST FOLLOW UP TESTING OF ANTITHROMBIN III LEVELS.   CBC  Status: Abnormal   Collection Time: 01/31/16  4:23 AM  Result Value Ref Range   WBC 6.8 4.0 - 10.5 K/uL   RBC 3.28 (L) 3.87 - 5.11 MIL/uL   Hemoglobin 9.5 (L) 12.0 - 15.0 g/dL   HCT 29.4 (L) 36.0 - 46.0 %   MCV 89.6 78.0 - 100.0 fL   MCH 29.0 26.0 - 34.0 pg   MCHC 32.3 30.0 - 36.0 g/dL   RDW 14.3 11.5 - 15.5 %   Platelets 137 (L) 150 - 400 K/uL  Lipid panel     Status: None   Collection Time: 01/31/16  4:23 AM  Result Value Ref Range   Cholesterol 130 0 - 200 mg/dL   Triglycerides 46 <150 mg/dL   HDL 49 >40 mg/dL   Total CHOL/HDL Ratio 2.7 RATIO   VLDL 9 0 - 40 mg/dL   LDL Cholesterol 72 0 - 99 mg/dL    Comment:        Total Cholesterol/HDL:CHD Risk Coronary Heart Disease Risk Table                     Men   Women  1/2 Average Risk   3.4   3.3  Average Risk       5.0   4.4  2 X Average Risk   9.6   7.1  3 X Average Risk  23.4   11.0        Use the calculated Patient Ratio above and the CHD Risk Table to  determine the patient's CHD Risk.        ATP III CLASSIFICATION (LDL):  <100     mg/dL   Optimal  100-129  mg/dL   Near or Above                    Optimal  130-159  mg/dL   Borderline  160-189  mg/dL   High  >190     mg/dL   Very High   Glucose, capillary     Status: Abnormal   Collection Time: 01/31/16  7:48 AM  Result Value Ref Range   Glucose-Capillary 137 (H) 65 - 99 mg/dL  Glucose, capillary     Status: Abnormal   Collection Time: 01/31/16 11:44 AM  Result Value Ref Range   Glucose-Capillary 115 (H) 65 - 99 mg/dL   Dg Chest 2 View  01/30/2016  CLINICAL DATA:  Confusion EXAM: CHEST  2 VIEW COMPARISON:  January 06, 2016 FINDINGS: There is no edema or consolidation. Heart is mildly enlarged with pulmonary vascularity within normal limits. There is calcification in the mitral annulus. No adenopathy. Bones are osteoporotic. There is degenerative change in the thoracic spine. IMPRESSION: Persistent mild cardiac enlargement. No edema or consolidation. Bones osteoporotic. Electronically Signed   By: Lowella Grip III M.Laura.   On: 01/30/2016 16:25   Ct Head Wo Contrast  01/30/2016  CLINICAL DATA:  Confusion with gait disturbance EXAM: CT HEAD WITHOUT CONTRAST TECHNIQUE: Contiguous axial images were obtained from the base of the skull through the vertex without intravenous contrast. COMPARISON:  December 31, 2015 FINDINGS: Mild to moderate diffuse atrophy is stable. There is no intracranial mass, hemorrhage, extra-axial fluid collection, or midline shift. Patchy small vessel disease in the centra semiovale bilaterally is stable. There is evidence of a prior small infarct in the posterior aspect of the posterior limb of the left internal capsule, stable. There is no new gray-white compartment lesion. No acute infarct evident.  The bony calvarium appears intact. The mastoid air cells are clear. Visualized orbits appear symmetric bilaterally. IMPRESSION: Atrophy with periventricular small vessel disease.  Prior small infarct in the posterior aspect of the posterior limb of the internal capsule on the left, stable. No acute infarct evident. No hemorrhage or mass effect. Electronically Signed   By: Bretta Bang III M.Laura.   On: 01/30/2016 16:27   Mr Brain Wo Contrast (neuro Protocol)  01/30/2016  CLINICAL DATA:  Confusion and unsteady gait. Personal history of colon cancer. EXAM: MRI HEAD WITHOUT CONTRAST TECHNIQUE: Multiplanar, multiecho pulse sequences of the brain and surrounding structures were obtained without intravenous contrast. COMPARISON:  MRI brain and MRA head 12/27/2015. CT head without contrast 01/30/2016. FINDINGS: In addition to the multiple punctate previously-seen bilateral cerebral and cerebellar infarcts, several new punctate foci of restricted diffusion in acute infarction are noted. Most prominent areas are in the left basal ganglia and right caudate head. There is a punctate focus the anterior right frontal lobe and medial right parietal lobe. New bilateral punctate lesions are present in the occipital poles and the posterior right insular cortex. There are new foci of acute nonhemorrhagic infarction involving the medial posterior temporal lobes and bilateral cerebellum, left greater than right. These white matter changes are associated with these lesions in addition to multiple other more remote ischemic lesions bilaterally. Moderate generalized atrophy is again noted. The ventricles are proportionate to the degree of atrophy. The internal auditory canals are within normal limits bilaterally. Flow is present in the major intracranial arteries. Bilateral lens replacements are present. Polyps or mucous retention cysts are noted along the inferior aspect of the right maxillary sinus without change. The paranasal sinuses are otherwise clear. There is some fluid in the mastoid air cells bilaterally. No obstructing nasopharyngeal lesion is evident. IMPRESSION: 1. Multiple additional acute  nonhemorrhagic punctate acute infarcts throughout both cerebral hemispheres and cerebellum. Watershed infarcts are previously suggested. These may again be watershed infarcts. Multiple embolic foci from a cardiac arrhythmia is also considered. 2. Multiple previous subacute infarcts are again seen. 3. Extensive white matter disease is present in both hemispheres. 4. Multiple remote lacunar infarcts are also noted, stable. 5. Right maxillary sinus disease is stable. Electronically Signed   By: Marin Roberts M.Laura.   On: 01/30/2016 18:30   01/04/16 Left Heart Cath and Coronary Angiography    Conclusion    1. Prox RCA lesion, 20% stenosed. 2. Ost Ramus to Ramus lesion, 40% stenosed. 3. Elevated LVEDP, 27 mmHg   Angiographically minimal coronary disease. No culprit lesion to explain significant elevation in troponin. Suspect some component of stress-induced cardiomyopathy given the elevated LVEDP. However this does not correlate with echocardiographic findings   ECHO:  12/28/15: Study Conclusions  - Left ventricle: The cavity size was normal. Wall thickness was  increased in a pattern of mild LVH. Systolic function was normal.  The estimated ejection fraction was in the range of 60% to 65%.  Wall motion was normal; there were no regional wall motion  abnormalities. Doppler parameters are consistent with abnormal  left ventricular relaxation (grade 1 diastolic dysfunction). - Aortic valve: Severely calcified annulus. Moderately thickened,  moderately calcified leaflets. There was mild stenosis. Valve  area (VTI): 1.86 cm^2. Valve area (Vmax): 1.75 cm^2. Valve area  (Vmean): 1.56 cm^2. - Mitral valve: Moderately calcified annulus. Moderately thickened,  moderately calcified leaflets . The findings are consistent with  mild stenosis. There was mild regurgitation. Valve area by  continuity equation (using LVOT  flow): 1.37 cm^2. - Left atrium: The atrium was mildly dilated. -  Right atrium: The atrium was moderately dilated.   ROS: General:no colds or fevers, no weight changes Skin:no rashes or ulcers HEENT:no blurred vision, no congestion CV:see HPI PUL:see HPI GI:no diarrhea constipation or melena, no indigestion GU:no hematuria, no dysuria MS:no joint pain, no claudication Neuro:no syncope, no lightheadedness Endo:no diabetes, no thyroid disease  Blood pressure 154/60, pulse 54, temperature 98.4 F (36.9 C), temperature source Oral, resp. rate 15, height '5\' 2"'$  (1.575 m), weight 146 lb 9.7 oz (66.5 kg), SpO2 97 %.  Wt Readings from Last 3 Encounters:  01/30/16 146 lb 9.7 oz (66.5 kg)  01/06/16 147 lb 8 oz (66.906 kg)  08/31/14 126 lb (57.153 kg)    PE: General:Pleasant affect, NAD, though somewhat agitated. Skin:Warm and dry, brisk capillary refill HEENT:normocephalic, sclera clear, mucus membranes moist Neck:supple, no JVD, no bruits  Heart:S1S2 RRR with soft murmur, no gallup, rub or click Lungs:clear without rales, rhonchi, or wheezes OHY:WVPX, non tender, + BS, do not palpate liver spleen or masses Ext:no lower ext edema, 2+ pedal pulses, 2+ radial pulses Neuro:alert and oriented to person, but does not understand thing, does confabulation. MAE, follows commands, + facial symmetry  Assessment/Plan Principal Problem:   Stroke [facility]) Active Problems:   Mixed hyperlipidemia   Essential hypertension, benign   Type 2 diabetes mellitus (HCC)   Gait abnormality   CHF (congestive heart failure) (HCC)   Legal blindness   PAF (paroxysmal atrial fibrillation) ([place])   Vascular dementia  New stroke- may be due to PAF currently SR with PVCs, but most likely will need TEE and anticoagulation when neuro clears- she is on IV Heparin- daughter is willing for anticoagulation.  Will make NPO after MN.    No A fib currently- non on last admit except possible initial EKG.  Sinus brady was off toprol but may have been taking at home.  Hold for now.      Elevated troponin possible due to CVA, will do serial troponins.  CAD last cath minimal disease.     Cecilie Kicks  Nurse Practitioner Certified [place] Pager [phone] or after 5pm or weekends call [phone] 01/31/2016, 4:19 PM   I have seen, examined and evaluated the patient this PM along with Cecilie Kicks, NP-C.  After reviewing all the available data and chart, we discussed the patients laboratory, study & physical findings as well as symptoms in detail. I agree with her findings, examination as well as impression recommendations as per our discussion.    Ms. Samudio presents now with a recurrent stroke. Her most recent hospitalization in April large watershed infarct with a troponin level that went up to greater than 30 despite having a cardiac catheterization without any significant disease. She now presents again with recurrent symptoms and the MRI showing recurrent stroke. She was discharged on aspirin during last hospital stay. There was some question of possible atrial fibrillation but I did not see any confirmation of that. She now presents with again is likely cardioembolic event.  She has a troponin elevation again which could very well be declining troponin from her last hospital stay versus a elevated troponin from her stroke. I would not pursue any cardiac evaluation at this time ischemic etiology. I would however consider TEE to exclude a cardioembolic phenomenon. We have not indicated for certain that the patient does have atrial fibrillation, but with potential cardioembolic in event and the questionable history of  A. fib, my recommendation would be to consider initiating therapy with a DOAC.  Monitor her heart rate and rhythm on telemetry. We will make her nothing by mouth after midnight and hope to schedule TEE tomorrow.  She was started on beta blocker during her prior hospitalization, and I would probably stop that based on her bradycardia.  Monitor EKG shows relatively significant bradycardia with heart rates in the 40s with diffuse inferior and lateral T-wave inversions which are probably related to her neurologic event.  Although her troponin is elevated, she declines having any sensation in all of chest tightness or pressure with rest or exertion or dyspnea. This makes a cardiac ischemic event very unlikely.  We will monitor telemetry for signs of atrial fibrillation, but otherwise cannot confirm or deny presence of A. fib.   Glenetta Hew, M.Laura., M.S. Interventional Cardiologist   Pager # [phone] Phone # [phone] [address] [place], [place] [address]

## 2016-01-31 NOTE — Progress Notes (Signed)
ANTICOAGULATION CONSULT NOTE - follow up  Pharmacy Consult for Heparin Indication: Rule out  chest pain/ACS and stroke  Allergies  Allergen Reactions  . Codeine Nausea Only   Patient Measurements: Height: 5\' 2"  (157.5 cm) Weight: 146 lb 9.7 oz (66.5 kg) IBW/kg (Calculated) : 50.1 HEPARIN DW (KG): 63.8   Vital Signs: Temp: 99.1 F (37.3 C) (05/18 0829) Temp Source: Oral (05/18 0829) BP: 130/52 mmHg (05/18 0829) Pulse Rate: 52 (05/18 0920)  Labs:  Recent Labs  01/30/16 1535 01/30/16 2000 01/31/16 0422 01/31/16 0423  HGB 10.7*  --   --  9.5*  HCT 32.9*  --   --  29.4*  PLT 158  --   --  137*  APTT  --  30  --   --   LABPROT  --  14.0  --   --   INR  --  1.06  --   --   HEPARINUNFRC  --   --  0.37  --   CREATININE 1.53*  --   --   --   TROPONINI 3.67*  --   --   --    Estimated Creatinine Clearance: 24.9 mL/min (by C-G formula based on Cr of 1.53).  Medical History: Past Medical History  Diagnosis Date  . Type 2 diabetes mellitus ([place])   . Colon cancer ([place])   . Anemia   . Essential hypertension, benign   . Mixed hyperlipidemia   . Macular degeneration   . History of pneumonia   . Iron deficiency anemia     Erosive antral gastritis  . Cognitive impairment   . Coronary artery disease 01/04/2016    "Angiographically minimal coronary disease. No culprit lesion to explain significant elevation in troponin"   Medications:  Prescriptions prior to admission  Medication Sig Dispense Refill Last Dose  . aspirin 325 MG tablet Take 1 tablet (325 mg total) by mouth daily. 30 tablet 0 01/30/2016 at Unknown time  . atorvastatin (LIPITOR) 10 MG tablet Take 10 mg by mouth daily.   01/30/2016 at Unknown time  . cholestyramine (QUESTRAN) 4 G packet Take 4 g by mouth daily. Mix one packet in liquid and drink   01/30/2016 at Unknown time  . citalopram (CELEXA) 40 MG tablet Take 1 tablet (40 mg total) by mouth daily. 30 tablet 6 01/30/2016 at Unknown time  . Cyanocobalamin (VITAMIN  B-12) 2500 MCG SUBL Place 2,500 mcg under the tongue daily.    01/30/2016 at Unknown time  . ferrous sulfate 325 (65 FE) MG tablet Take 325 mg by mouth daily with breakfast.   01/30/2016 at Unknown time  . metoprolol succinate (TOPROL-XL) 50 MG 24 hr tablet Take 50 mg by mouth daily.   01/30/2016 at 800  . mirtazapine (REMERON) 15 MG tablet Take 15 mg by mouth at bedtime.   01/29/2016 at Unknown time  . pioglitazone (ACTOS) 15 MG tablet Take 15 mg by mouth daily.   01/30/2016 at Unknown time  . alendronate (FOSAMAX) 70 MG tablet Take 70 mg by mouth every 7 (seven) days. Take with a full glass of water on an empty stomach. - on Fridays   01/25/2016  . meclizine (ANTIVERT) 25 MG tablet Take 25 mg by mouth 3 (three) times daily as needed. dizziness   unknown  . OLANZapine (ZYPREXA) 2.5 MG tablet Take 1 tablet (2.5 mg total) by mouth at bedtime. (Patient not taking: Reported on 01/30/2016) 30 tablet 0   . ondansetron (ZOFRAN-ODT) 4 MG disintegrating tablet Take  4 mg by mouth every 6 (six) hours as needed. Nausea and vomiting   never    Home med list reviewed.  No oral anticoagulants noted, Aspirin 325mg  daily noted  Assessment: Okay for Protocol, Recent admission at [facility] for treatment of NSTEMI / STROKE (Discharged 01/06/16).   Head CT (-) acute infarct.   MRI = Multiple additional acute nonhemorrhagic punctate acute infarcts throughout both cerebral hemispheres and cerebellum. Watershed infarcts are previously suggested. These may again be watershed infarcts. Multiple embolic foci from a cardiac arrhythmia is also considered.  Heparin level is therapeutic.      Goal of Therapy:  Heparin level 0.3-0.7 units/ml Monitor platelets by anticoagulation protocol: Yes   Plan:  Continue Heparin infusion at 1150 units/hr Heparin level and CBC daily Monitor for s/sx of bleeding complications  Hart Robinsons A 01/31/2016,9:36 AM

## 2016-01-31 NOTE — Progress Notes (Signed)
Transferred to [facility] [id number] by [facility] in stable condition, reported to Jolyn Nap, RN.

## 2016-02-01 ENCOUNTER — Inpatient Hospital Stay (HOSPITAL_COMMUNITY): Payer: Medicare Other

## 2016-02-01 ENCOUNTER — Encounter (HOSPITAL_COMMUNITY): Payer: Self-pay

## 2016-02-01 ENCOUNTER — Encounter (HOSPITAL_COMMUNITY)
Admission: EM | Disposition: A | Discharge status: Home / Self Care | Payer: Self-pay | Source: Home / Self Care | Attending: Internal Medicine

## 2016-02-01 DIAGNOSIS — I340 Nonrheumatic mitral (valve) insufficiency: Secondary | ICD-10-CM

## 2016-02-01 DIAGNOSIS — E1159 Type 2 diabetes mellitus with other circulatory complications: Secondary | ICD-10-CM

## 2016-02-01 DIAGNOSIS — I639 Cerebral infarction, unspecified: Secondary | ICD-10-CM | POA: Insufficient documentation

## 2016-02-01 DIAGNOSIS — H548 Legal blindness, as defined in USA: Secondary | ICD-10-CM

## 2016-02-01 DIAGNOSIS — I6340 Cerebral infarction due to embolism of unspecified cerebral artery: Principal | ICD-10-CM

## 2016-02-01 DIAGNOSIS — I638 Other cerebral infarction: Secondary | ICD-10-CM

## 2016-02-01 HISTORY — PX: TEE WITHOUT CARDIOVERSION: SHX5443

## 2016-02-01 LAB — HEPARIN LEVEL (UNFRACTIONATED): Heparin Unfractionated: 0.56 IU/mL (ref 0.30–0.70)

## 2016-02-01 LAB — CBC
HCT: 31.4 % — ABNORMAL LOW (ref 36.0–46.0)
HEMOGLOBIN: 10 g/dL — AB (ref 12.0–15.0)
MCH: 27.4 pg (ref 26.0–34.0)
MCHC: 31.8 g/dL (ref 30.0–36.0)
MCV: 86 fL (ref 78.0–100.0)
Platelets: 147 10*3/uL — ABNORMAL LOW (ref 150–400)
RBC: 3.65 MIL/uL — AB (ref 3.87–5.11)
RDW: 14.5 % (ref 11.5–15.5)
WBC: 9.6 10*3/uL (ref 4.0–10.5)

## 2016-02-01 LAB — URINE CULTURE: Culture: NO GROWTH

## 2016-02-01 LAB — BASIC METABOLIC PANEL
ANION GAP: 9 (ref 5–15)
BUN: 22 mg/dL — ABNORMAL HIGH (ref 6–20)
CALCIUM: 9 mg/dL (ref 8.9–10.3)
CO2: 25 mmol/L (ref 22–32)
Chloride: 105 mmol/L (ref 101–111)
Creatinine, Ser: 1.45 mg/dL — ABNORMAL HIGH (ref 0.44–1.00)
GFR, EST AFRICAN AMERICAN: 37 mL/min — AB (ref >60)
GFR, EST NON AFRICAN AMERICAN: 32 mL/min — AB (ref >60)
GLUCOSE: 160 mg/dL — AB (ref 65–99)
Potassium: 4.1 mmol/L (ref 3.5–5.1)
Sodium: 139 mmol/L (ref 135–145)

## 2016-02-01 LAB — GLUCOSE, CAPILLARY
GLUCOSE-CAPILLARY: 137 mg/dL — AB (ref 65–99)
GLUCOSE-CAPILLARY: 92 mg/dL (ref 65–99)
GLUCOSE-CAPILLARY: 199 mg/dL — AB (ref 65–99)
Glucose-Capillary: 182 mg/dL — ABNORMAL HIGH (ref 65–99)
Glucose-Capillary: 124 mg/dL — ABNORMAL HIGH (ref 65–99)

## 2016-02-01 LAB — PROTIME-INR
INR: 1.16 (ref 0.00–1.49)
Prothrombin Time: 15 seconds (ref 11.6–15.2)

## 2016-02-01 LAB — HEMOGLOBIN A1C
Hgb A1c MFr Bld: 6.9 % — ABNORMAL HIGH (ref 4.8–5.6)
MEAN PLASMA GLUCOSE: 151 mg/dL

## 2016-02-01 SURGERY — ECHOCARDIOGRAM, TRANSESOPHAGEAL
Anesthesia: Moderate Sedation

## 2016-02-01 MED ORDER — APIXABAN 2.5 MG PO TABS
2.5000 mg | ORAL_TABLET | Freq: Two times a day (BID) | ORAL | Status: DC
  Administered 2016-02-01 – 2016-02-02 (×3): 2.5 mg via ORAL
  Filled 2016-02-01 (×3): qty 1

## 2016-02-01 MED ORDER — SODIUM CHLORIDE 0.9 % IV SOLN: INTRAVENOUS | Status: DC

## 2016-02-01 MED ORDER — DIPHENHYDRAMINE HCL 50 MG/ML IJ SOLN
INTRAMUSCULAR | Status: AC
  Filled 2016-02-01: qty 1

## 2016-02-01 MED ORDER — CITALOPRAM HYDROBROMIDE 10 MG PO TABS
20.0000 mg | ORAL_TABLET | Freq: Every day | ORAL | Status: DC
  Administered 2016-02-02: 20 mg via ORAL
  Filled 2016-02-01: qty 2

## 2016-02-01 MED ORDER — FENTANYL CITRATE (PF) 100 MCG/2ML IJ SOLN
INTRAMUSCULAR | Status: AC
  Filled 2016-02-01: qty 2

## 2016-02-01 MED ORDER — MIDAZOLAM HCL 10 MG/2ML IJ SOLN
INTRAMUSCULAR | Status: DC | PRN
  Administered 2016-02-01: 1 mg via INTRAVENOUS

## 2016-02-01 MED ORDER — MIDAZOLAM HCL 5 MG/ML IJ SOLN
INTRAMUSCULAR | Status: AC
  Filled 2016-02-01: qty 2

## 2016-02-01 MED ORDER — BISACODYL 10 MG RE SUPP
10.0000 mg | Freq: Once | RECTAL | Status: AC
  Administered 2016-02-01: 10 mg via RECTAL
  Filled 2016-02-01: qty 1

## 2016-02-01 NOTE — Progress Notes (Signed)
[facility]  Patient Status: Active (receiving services up to time of hospitalization)  [facility] is providing the following services: RN, PT and OT  If patient discharges after hours, please call [phone].   Laura Parks 02/01/2016, 10:28 AM

## 2016-02-01 NOTE — Progress Notes (Signed)
Pt seems more confused today than yesterday. NIH score is higher, md made aware and at bedside with rn.

## 2016-02-01 NOTE — Progress Notes (Signed)
STROKE TEAM PROGRESS NOTE   HISTORY OF PRESENT ILLNESS (per record) Laura Parks is an 82 y.o. female patient transferred from [facility] for further management of acute cerebral embolic infacrts noted on brain MRI. She has a questionable history of atrial fibrillation discovered 1 month ago, per history from her daughter. She was seen by cardiologist today and it is unclear if she actually had a documented A. fib or not, per cardiologist. Clinically, patient does not have any focal neurological symptoms, no new speech problems or focal sensory or motor symptoms. She does have poor visual activity at baseline and narrow visual field from macular degeneration which have been grossly unchanged. She was started on heparin drip at [facility] prior to transfer.    SUBJECTIVE (INTERVAL HISTORY) Her RN is at the bedside.  Overall she feels her condition is rapidly improving. She has limited vision due to macular degeneration. She was admitted last month for cardioembolic stroke with acute MI, was on IV heparin but on discharge, decision was to no anticoagulation due to fall risk with poor vision and mild dementia after discussion with daughter. This time, daughter agrees with anticoagulation. She is pending TEE today by cardiology.    OBJECTIVE Temp:  [98.2 F (36.8 C)-99 F (37.2 C)] 98.2 F (36.8 C) (05/19 0900) Pulse Rate:  [54-70] 66 (05/19 0900) Cardiac Rhythm:  [-] Normal sinus rhythm (05/19 0800) Resp:  [15-16] 16 (05/19 1028) BP: (84-188)/(46-83) 158/67 mmHg (05/19 0900) SpO2:  [86 %-99 %] 99 % (05/19 1028)  CBC:  Recent Labs Lab 01/30/16 1535 01/31/16 0423 02/01/16 0541  WBC 7.7 6.8 9.6  NEUTROABS 4.9  --   --   HGB 10.7* 9.5* 10.0*  HCT 32.9* 29.4* 31.4*  MCV 88.7 89.6 86.0  PLT 158 137* 147*    Basic Metabolic Panel:  Recent Labs Lab 01/30/16 1535 02/01/16 0541  NA 133* 139  K 4.0 4.1  CL 102 105  CO2 23 25  GLUCOSE 136* 160*  BUN 26* 22*   CREATININE 1.53* 1.45*  CALCIUM 8.6* 9.0  MG 1.8  --     Lipid Panel:    Component Value Date/Time   CHOL 130 01/31/2016 0423   TRIG 46 01/31/2016 0423   HDL 49 01/31/2016 0423   CHOLHDL 2.7 01/31/2016 0423   VLDL 9 01/31/2016 0423   LDLCALC 72 01/31/2016 0423   HgbA1c:  Lab Results  Component Value Date   HGBA1C 6.9* 01/31/2016   Urine Drug Screen: No results found for: LABOPIA, COCAINSCRNUR, LABBENZ, AMPHETMU, THCU, LABBARB    IMAGING I have personally reviewed the radiological images below and agree with the radiology interpretations.  Dg Chest 2 View 01/30/2016  Persistent mild cardiac enlargement. No edema or consolidation. Bones osteoporotic.   Ct Head Wo Contrast 01/30/2016  Atrophy with periventricular small vessel disease. Prior small infarct in the posterior aspect of the posterior limb of the internal capsule on the left, stable. No acute infarct evident. No hemorrhage or mass effect.   Mr Brain Wo Contrast (neuro Protocol) 01/30/2016   1. Multiple additional acute nonhemorrhagic punctate acute infarcts throughout both cerebral hemispheres and cerebellum. Watershed infarcts are previously suggested. These may again be watershed infarcts. Multiple embolic foci from a cardiac arrhythmia is also considered. 2. Multiple previous subacute infarcts are again seen. 3. Extensive white matter disease is present in both hemispheres. 4. Multiple remote lacunar infarcts are also noted, stable. 5. Right maxillary sinus disease is stable.  PHYSICAL EXAM  Temp:  [98.2 F (36.8 C)-99 F (37.2 C)] 98.2 F (36.8 C) (05/19 0900) Pulse Rate:  [54-70] 66 (05/19 0900) Resp:  [15-16] 16 (05/19 1028) BP: (84-188)/(60-83) 158/67 mmHg (05/19 0900) SpO2:  [86 %-99 %] 99 % (05/19 1028)  General - Well nourished, well developed, in no apparent distress.  Ophthalmologic - Fundi not visualized due to noncooperation.  Cardiovascular - Regular rate and rhythm, not in afib rhythm during  round.  Mental Status -  Level of arousal and orientation to time, and person were intact, but not orientated to place, thought was at her brother's house. Language including expression, naming, repetition, comprehension was assessed and found intact.  Cranial Nerves II - XII - II - poor vision bilateral eyes, able to tell finger counting only. III, IV, VI - Extraocular movements intact. V - Facial sensation intact bilaterally. VII - Facial movement intact bilaterally. VIII - Hearing & vestibular intact bilaterally. X - Palate elevates symmetrically. XI - Chin turning & shoulder shrug intact bilaterally. XII - Tongue protrusion intact.  Motor Strength - The patient's strength was normal in all extremities and pronator drift was absent.  Bulk was normal and fasciculations were absent.   Motor Tone - Muscle tone was assessed at the neck and appendages and was normal.  Reflexes - The patient's reflexes were 1+ in all extremities and she had no pathological reflexes.  Sensory - Light touch, temperature/pinprick were assessed and were symmetrical.    Coordination - The patient had normal movements in the hands with no ataxia or dysmetria.  Tremor was absent.  Gait and Station - not tested due to safety concerns   ASSESSMENT/PLAN [name] is a 82 y.o. female with history of recent strokes/elevated troponins who was placed on ASA and cath without significant CAD, ? PAF, dementia and diabetes presenting to [facility] "acting strange and confused". MRI with new embolic infarcts. Transferred to [facility] for further evaluation.  She did not receive IV t-PA.   Stroke:  Multiple bilateral anterior and posterior circulation infarcts, embolic secondary to cardiac source, highly suspicious for afib although has not confirmed yet.   MRI  Multiple punctate B anterior and posterior circulation infarcts in setting of subacute multiple B embolic infarcts.   TEE pending  MRA 12/2015 moderate stenosis of  right cavernous ICA  Carotid Doppler  4.2017 No significant stenosis   2D Echo  12/2015 EF 60-65% with no source of embolus.  LE dopplers 12/2015 negative   LDL 72  HgbA1c 6.9  IV heparin for VTE prophylaxis  Diet NPO time specified  aspirin 325 mg daily prior to admission, now on ASA and eliquis. Continue eliquis on discharge for stroke prevention  Patient counseled to be compliant with her antithrombotic medications  Ongoing aggressive stroke risk factor management  Therapy recommendations:  HH PT  Disposition:  pending  Followed with Dr. Jaynee Eagles on 02/06/16 as scheduled  Hypertension  Stable  Permissive hypertension (OK if < 220/120) but gradually normalize in 5-7 days  Hyperlipidemia  Home meds:  lipitor 10, resumed in hospital  LDL 72, goal < 70  Continue statin at discharge  Diabetes  HgbA1c 6.9, at goal < 7.0  Controlled  SSI  Other Stroke Risk Factors  Advanced age  Hx stroke/TIA  12/2015 Bilateral, infra and supra-tentorial infarcts, consistent with cardioembolic infarcts etiology not determined  Coronary artery disease, minimal disease on cath  CHF, stable  Other Active Problems  Cognitive impairment, vascular type, follwed  by Dr. 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 Please call with questions. Pt will follow up with Dr. Jaynee Eagles at [facility] on 02/06/16 as scheduled. Thanks for the consult.  Rosalin Hawking, MD PhD Stroke Neurology 02/01/2016 1:23 PM     To contact Stroke Continuity provider, please refer to [email or web address] After hours, contact General Neurology

## 2016-02-01 NOTE — Progress Notes (Signed)
  Echocardiogram Echocardiogram Transesophageal has been performed.  Laura Parks 02/01/2016, 3:23 PM

## 2016-02-01 NOTE — Progress Notes (Signed)
When md at bedside, when asked what her name was pt was giving her brothers name, after asking pt several times pt would then state her correct name, and said the mans name was her brothers. Pt answered questions in confused manner.  When neuro md was at bedside, when asked what her name was pt said her name was "April". rn then asked who April was and pt said that was her niece. Then pt finally said her name correctly. But then later said her name incorrectly.   For 1 minute pt was alert and oriented x4, but then went back to being confused.

## 2016-02-01 NOTE — H&P (View-Only) (Signed)
PROGRESS NOTE                                                                                                                                                                                                             Patient Demographics:    Laura Parks, is a 82 y.o. female, DOB - 1930-08-20, [id number]  Admit date - 01/30/2016   Admitting Physician Phillips Grout, MD  Outpatient Primary MD for the patient is Glenda Chroman, MD  LOS - 2   Chief Complaint  Patient presents with  . Gait Problem  . Altered Mental Status       Brief Narrative    82 year old pleasant Caucasian female with history of dementia, type 2 diabetes mellitus, Takotsubo-cardiomyopathy with troponin elevation in mid 55s recently with a negative left heart cath. Months ago, possible paroxysmal atrial fibrillation, history of watershed infarct, who lives at home with her husband was brought in last night to any pan hospital with confusion and loss of balance, of note she has never had a fall. In the ER brain MRI showed new watershed infarct, with moderate elevation in troponin, she was placed on heparin drip and admitted to the hospitalist service.  In the past patient was not anticoagulated due to her dementia for her stroke, there was talk of doing a TEE or loop recorder to conform questionable paroxysmal atrial fibrillation but this was not done as it was thought that patient eventually will not be anticoagulated.  Patient was transferred to [facility] to be evaluated by stroke team and by cardiology.  Plan for TEE on 5/19.   Subjective:   Pleasantly confused initially about name but then was able to tell name but confused about place-- asking for her brother   Assessment  & Plan :     Embolic CVA with watershed infarct on MRI similar to recent Event few months ago.  IV heparin drip-- change to eliquis -for TEE today -neuro following   LDL  is almost at goal, continue statin at present dose, A1c is acceptable for her age.  She recently had extensive stroke workup. Including unremarkable carotid duplex and echogram. We'll defer any new workup to cardiology and neurology.  Lab Results  Component Value Date   HGBA1C 6.9* 01/31/2016   Lab Results  Component Value Date   CHOL  130 01/31/2016   HDL 49 01/31/2016   LDLCALC 72 01/31/2016   TRIG 46 01/31/2016   CHOLHDL 2.7 01/31/2016     NSTEMI - chest pain free, EKG with no new changes, she had massive elevation of troponin last admission with a unremarkable left heart cath and a diagnosis of Takotsubo cardiomyopathy -beta blocker and statin for secondary prevention. Cardiology following  Questionable paroxysmal atrial fibrillation. [place] vasc 2 score will be greater than 6. This was mentioned in cardiology notes last admission however it was never confirmed, telemetry monitoring, beta blocker, may require loop recorder versus TEE to prove cardioembolic source of her stroke. We'll defer further workup to cardiology. Have discussed this with cardiology triage nurse who will have cardiology see the patient at [facility].  Vascular dementia and gait abnormality. This is patient's baseline, however she had never had a fall  -supportive care with PT, at risk for delirium, minimize narcotics and benzodiazepines.  Dyslipidemia. On statin continue. LDL almost at goal, will not increase statin dose due to her age.  Hypertension. Home does beta blocker. Blood pressure stable  CKD 4 - baseline creatinine close to 1.4. Monitor.   DM type II.   on sliding scale. A1c acceptable.  CBG (last 3)   Recent Labs  02/01/16 0633 02/01/16 0929 02/01/16 1121  GLUCAP 182* 137* 124*     Code Status :  Full  Family Communication  :  No family at bedside currently Disposition Plan  :    Consults  : Neuro cards  Procedures  :    MRI brain confirming watershed infarct  DVT Prophylaxis   :  Heparin  Gtt-- change to eliquis  Lab Results  Component Value Date   PLT 147* 02/01/2016    Inpatient Medications  Scheduled Meds: . apixaban  2.5 mg Oral BID  . aspirin  81 mg Oral Daily  . atorvastatin  10 mg Oral Daily  . cholestyramine  4 g Oral Daily  . [START ON 02/02/2016] citalopram  20 mg Oral Daily  . insulin aspart  0-9 Units Subcutaneous TID WC  . mirtazapine  15 mg Oral QHS  . OLANZapine  2.5 mg Oral QHS  . sodium chloride flush  3 mL Intravenous Q12H  . vitamin B-12  2,000 mcg Oral Daily   Continuous Infusions: . sodium chloride     PRN Meds:.sodium chloride, sodium chloride flush  Antibiotics  :    Anti-infectives    None         Objective:   Filed Vitals:   02/01/16 1024 02/01/16 1025 02/01/16 1026 02/01/16 1028  BP:      Pulse:      Temp:      TempSrc:      Resp:    16  Height:      Weight:      SpO2: 86% 91% 95% 99%    Wt Readings from Last 3 Encounters:  01/30/16 66.5 kg (146 lb 9.7 oz)  01/06/16 66.906 kg (147 lb 8 oz)  08/31/14 57.153 kg (126 lb)     Intake/Output Summary (Last 24 hours) at 02/01/16 1315 Last data filed at 01/31/16 2151  Gross per 24 hour  Intake  91.36 ml  Output      0 ml  Net  91.36 ml     Physical Exam  Awake however she is pleasantly confused Symmetrical Chest wall movement, Good air movement bilaterally, CTAB RRR,No Gallops,Rubs or new Murmurs, No Parasternal Heave +ve B.Sounds,  Abd Soft, No tenderness, No organomegaly appriciated, No rebound - guarding or rigidity. Follows commands    Data Review:    CBC  Recent Labs Lab 01/30/16 1535 01/31/16 0423 02/01/16 0541  WBC 7.7 6.8 9.6  HGB 10.7* 9.5* 10.0*  HCT 32.9* 29.4* 31.4*  PLT 158 137* 147*  MCV 88.7 89.6 86.0  MCH 28.8 29.0 27.4  MCHC 32.5 32.3 31.8  RDW 14.7 14.3 14.5  LYMPHSABS 1.8  --   --   MONOABS 1.1*  --   --   EOSABS 0.0  --   --   BASOSABS 0.0  --   --     Chemistries   Recent Labs Lab 01/30/16 1535  02/01/16 0541  NA 133* 139  K 4.0 4.1  CL 102 105  CO2 23 25  GLUCOSE 136* 160*  BUN 26* 22*  CREATININE 1.53* 1.45*  CALCIUM 8.6* 9.0  MG 1.8  --   AST 21  --   ALT 12*  --   ALKPHOS 71  --   BILITOT 0.6  --    ------------------------------------------------------------------------------------------------------------------  Recent Labs  01/31/16 0423  CHOL 130  HDL 49  LDLCALC 72  TRIG 46  CHOLHDL 2.7    Lab Results  Component Value Date   HGBA1C 6.9* 01/31/2016   ------------------------------------------------------------------------------------------------------------------ No results for input(s): TSH, T4TOTAL, T3FREE, THYROIDAB in the last 72 hours.  Invalid input(s): FREET3 ------------------------------------------------------------------------------------------------------------------ No results for input(s): VITAMINB12, FOLATE, FERRITIN, TIBC, IRON, RETICCTPCT in the last 72 hours.  Coagulation profile  Recent Labs Lab 01/30/16 2000 02/01/16 0541  INR 1.06 1.16    No results for input(s): DDIMER in the last 72 hours.  Cardiac Enzymes  Recent Labs Lab 01/30/16 1535  TROPONINI 3.67*   ------------------------------------------------------------------------------------------------------------------    Component Value Date/Time   BNP 252.5* 12/26/2015 0104    Micro Results Recent Results (from the past 240 hour(s))  Urine culture     Status: None   Collection Time: 01/30/16  6:30 PM  Result Value Ref Range Status   Specimen Description URINE, CATHETERIZED  Final   Special Requests NONE  Final   Culture NO GROWTH Performed at [facility]   Final   Report Status 02/01/2016 FINAL  Final    Radiology Reports Dg Chest 2 View  01/30/2016  CLINICAL DATA:  Confusion EXAM: CHEST  2 VIEW COMPARISON:  January 06, 2016 FINDINGS: There is no edema or consolidation. Heart is mildly enlarged with pulmonary vascularity within normal limits.  There is calcification in the mitral annulus. No adenopathy. Bones are osteoporotic. There is degenerative change in the thoracic spine. IMPRESSION: Persistent mild cardiac enlargement. No edema or consolidation. Bones osteoporotic. Electronically Signed   By: Lowella Grip III M.D.   On: 01/30/2016 16:25   Dg Chest 2 View  01/06/2016  CLINICAL DATA:  Confusion. Slurred speech. Congestive heart failure. EXAM: CHEST  2 VIEW COMPARISON:  12/26/2015 FINDINGS: Cardiomegaly is stable. Decreased diffuse interstitial infiltrates are seen, consistent with resolving interstitial edema. No evidence of pulmonary consolidation. Tiny pleural effusions are noted posteriorly. IMPRESSION: Interval decrease in diffuse interstitial edema since prior study, consistent with resolving congestive heart failure. Electronically Signed   By: Earle Gell M.D.   On: 01/06/2016 12:36   Ct Head Wo Contrast  01/30/2016  CLINICAL DATA:  Confusion with gait disturbance EXAM: CT HEAD WITHOUT CONTRAST TECHNIQUE: Contiguous axial images were obtained from the base of the skull through the vertex without intravenous contrast. COMPARISON:  December 31, 2015 FINDINGS: Mild to moderate diffuse atrophy is stable. There is no intracranial mass, hemorrhage, extra-axial fluid collection, or midline shift. Patchy small vessel disease in the centra semiovale bilaterally is stable. There is evidence of a prior small infarct in the posterior aspect of the posterior limb of the left internal capsule, stable. There is no new gray-white compartment lesion. No acute infarct evident. The bony calvarium appears intact. The mastoid air cells are clear. Visualized orbits appear symmetric bilaterally. IMPRESSION: Atrophy with periventricular small vessel disease. Prior small infarct in the posterior aspect of the posterior limb of the internal capsule on the left, stable. No acute infarct evident. No hemorrhage or mass effect. Electronically Signed   By: Lowella Grip III M.D.   On: 01/30/2016 16:27   Mr Brain Wo Contrast (neuro Protocol)  01/30/2016  CLINICAL DATA:  Confusion and unsteady gait. Personal history of colon cancer. EXAM: MRI HEAD WITHOUT CONTRAST TECHNIQUE: Multiplanar, multiecho pulse sequences of the brain and surrounding structures were obtained without intravenous contrast. COMPARISON:  MRI brain and MRA head 12/27/2015. CT head without contrast 01/30/2016. FINDINGS: In addition to the multiple punctate previously-seen bilateral cerebral and cerebellar infarcts, several new punctate foci of restricted diffusion in acute infarction are noted. Most prominent areas are in the left basal ganglia and right caudate head. There is a punctate focus the anterior right frontal lobe and medial right parietal lobe. New bilateral punctate lesions are present in the occipital poles and the posterior right insular cortex. There are new foci of acute nonhemorrhagic infarction involving the medial posterior temporal lobes and bilateral cerebellum, left greater than right. These white matter changes are associated with these lesions in addition to multiple other more remote ischemic lesions bilaterally. Moderate generalized atrophy is again noted. The ventricles are proportionate to the degree of atrophy. The internal auditory canals are within normal limits bilaterally. Flow is present in the major intracranial arteries. Bilateral lens replacements are present. Polyps or mucous retention cysts are noted along the inferior aspect of the right maxillary sinus without change. The paranasal sinuses are otherwise clear. There is some fluid in the mastoid air cells bilaterally. No obstructing nasopharyngeal lesion is evident. IMPRESSION: 1. Multiple additional acute nonhemorrhagic punctate acute infarcts throughout both cerebral hemispheres and cerebellum. Watershed infarcts are previously suggested. These may again be watershed infarcts. Multiple embolic foci from a  cardiac arrhythmia is also considered. 2. Multiple previous subacute infarcts are again seen. 3. Extensive white matter disease is present in both hemispheres. 4. Multiple remote lacunar infarcts are also noted, stable. 5. Right maxillary sinus disease is stable. Electronically Signed   By: San Morelle M.D.   On: 01/30/2016 18:30    Time Spent in minutes  [place] DO on 02/01/2016 at 1:15 PM  Between 7am to 7pm - Pager - [phone]  After 7pm go to [email or web address] - password [facility]  [facility] -  Office  [phone]

## 2016-02-01 NOTE — Progress Notes (Signed)
Pt does not have any dentures.

## 2016-02-01 NOTE — Progress Notes (Signed)
rn realized that pt has not voided since 1040 this morning. Did bladder scan, >600 ml. md made aware

## 2016-02-01 NOTE — Progress Notes (Signed)
Pt to endo. 

## 2016-02-01 NOTE — Progress Notes (Signed)
SLP Cancellation Note  Patient Details Name: Laura Parks MRN: [id number] DOB: [birth year]   Cancelled treatment:       Reason Eval/Treat Not Completed: Patient at procedure or test/unavailable   Sonia Baller, MA, CCC-SLP 02/01/2016 3:13 PM

## 2016-02-01 NOTE — Progress Notes (Addendum)
    [facility] has been requested to perform a transesophageal echocardiogram on Laura Parks for recurrent CVA .  After careful review of history and examination, the risks and benefits of transesophageal echocardiogram have been explained including risks of esophageal damage, perforation (1:10,000 risk), bleeding, pharyngeal hematoma as well as other potential complications associated with conscious sedation including aspiration, arrhythmia, respiratory failure and death. Alternatives to treatment were discussed, questions were answered. Patient's daughter is willing to proceed. Called daughter Jhonnie Garner POA and discussed risks of TEE.  She has agreed to proceed.  Dr. Ellyn Hack also discussed reasoning for TEE last pm with pt and daughter.   Cecilie Kicks, FNP-C  02/01/2016 7:59 AM

## 2016-02-01 NOTE — Progress Notes (Signed)
Cardiology at bedside, and noted that pt is not as alert as yesterday

## 2016-02-01 NOTE — Progress Notes (Addendum)
Since foley placed, pt woke up, alert and oriented x4, and now eating a meal. Says she feels better.   md made aware

## 2016-02-01 NOTE — Progress Notes (Signed)
When sleeping pts O2 sat drops to 87%, quickly rises back to 91%. Will place pt on 2 L Tea

## 2016-02-01 NOTE — Progress Notes (Signed)
Family at bedside and given an update  Daughter reports that since stroke 1 month ago pt has been having pleasant hallucinations. Last night pt was having one where the fall light button was her dog and she was petting her dog.   rn has not noted any hallucinations today, just that pt has been very confused.

## 2016-02-01 NOTE — Interval H&P Note (Signed)
History and Physical Interval Note:  02/01/2016 2:13 PM  Laura Parks  has presented today for surgery, with the diagnosis of stroke  The various methods of treatment have been discussed with the patient and family. After consideration of risks, benefits and other options for treatment, the patient has consented to  Procedure(s): TRANSESOPHAGEAL ECHOCARDIOGRAM (TEE) (N/A) as a surgical intervention .  The patient's history has been reviewed, patient examined, no change in status, stable for surgery.  I have reviewed the patient's chart and labs.  Questions were answered to the patient's satisfaction.     [employer]

## 2016-02-01 NOTE — Progress Notes (Signed)
PROGRESS NOTE                                                                                                                                                                                                             Patient Demographics:    Laura Parks, is a 82 y.o. female, DOB - [birth year], HC:[id number]  Admit date - 01/30/2016   Admitting Physician Phillips Grout, MD  Outpatient Primary MD for the patient is Glenda Chroman, MD  LOS - 2   Chief Complaint  Patient presents with  . Gait Problem  . Altered Mental Status       Brief Narrative    82 year old pleasant Caucasian female with history of dementia, type 2 diabetes mellitus, Takotsubo-cardiomyopathy with troponin elevation in mid 75s recently with a negative left heart cath. Months ago, possible paroxysmal atrial fibrillation, history of watershed infarct, who lives at home with her husband was brought in last night to any pan hospital with confusion and loss of balance, of note she has never had a fall. In the ER brain MRI showed new watershed infarct, with moderate elevation in troponin, she was placed on heparin drip and admitted to the hospitalist service.  In the past patient was not anticoagulated due to her dementia for her stroke, there was talk of doing a TEE or loop recorder to conform questionable paroxysmal atrial fibrillation but this was not done as it was thought that patient eventually will not be anticoagulated.  Patient was transferred to [facility] to be evaluated by stroke team and by cardiology.  Plan for TEE on 5/19.   Subjective:   Pleasantly confused initially about name but then was able to tell name but confused about place-- asking for her brother   Assessment  & Plan :     Embolic CVA with watershed infarct on MRI similar to recent Event few months ago.  IV heparin drip-- change to eliquis -for TEE today -neuro following   LDL  is almost at goal, continue statin at present dose, A1c is acceptable for her age.  She recently had extensive stroke workup. Including unremarkable carotid duplex and echogram. We'll defer any new workup to cardiology and neurology.  Lab Results  Component Value Date   HGBA1C 6.9* 01/31/2016   Lab Results  Component Value Date   CHOL  130 01/31/2016   HDL 49 01/31/2016   LDLCALC 72 01/31/2016   TRIG 46 01/31/2016   CHOLHDL 2.7 01/31/2016     NSTEMI - chest pain free, EKG with no new changes, she had massive elevation of troponin last admission with a unremarkable left heart cath and a diagnosis of Takotsubo cardiomyopathy -beta blocker and statin for secondary prevention. Cardiology following  Questionable paroxysmal atrial fibrillation. [place] vasc 2 score will be greater than 6. This was mentioned in cardiology notes last admission however it was never confirmed, telemetry monitoring, beta blocker, may require loop recorder versus TEE to prove cardioembolic source of her stroke. We'll defer further workup to cardiology. Have discussed this with cardiology triage nurse who will have cardiology see the patient at [facility].  Vascular dementia and gait abnormality. This is patient's baseline, however she had never had a fall  -supportive care with PT, at risk for delirium, minimize narcotics and benzodiazepines.  Dyslipidemia. On statin continue. LDL almost at goal, will not increase statin dose due to her age.  Hypertension. Home does beta blocker. Blood pressure stable  CKD 4 - baseline creatinine close to 1.4. Monitor.   DM type II.   on sliding scale. A1c acceptable.  CBG (last 3)   Recent Labs  02/01/16 0633 02/01/16 0929 02/01/16 1121  GLUCAP 182* 137* 124*     Code Status :  Full  Family Communication  :  No family at bedside currently Disposition Plan  :    Consults  : Neuro cards  Procedures  :    MRI brain confirming watershed infarct  DVT Prophylaxis   :  Heparin  Gtt-- change to eliquis  Lab Results  Component Value Date   PLT 147* 02/01/2016    Inpatient Medications  Scheduled Meds: . apixaban  2.5 mg Oral BID  . aspirin  81 mg Oral Daily  . atorvastatin  10 mg Oral Daily  . cholestyramine  4 g Oral Daily  . [START ON 02/02/2016] citalopram  20 mg Oral Daily  . insulin aspart  0-9 Units Subcutaneous TID WC  . mirtazapine  15 mg Oral QHS  . OLANZapine  2.5 mg Oral QHS  . sodium chloride flush  3 mL Intravenous Q12H  . vitamin B-12  2,000 mcg Oral Daily   Continuous Infusions: . sodium chloride     PRN Meds:.sodium chloride, sodium chloride flush  Antibiotics  :    Anti-infectives    None         Objective:   Filed Vitals:   02/01/16 1024 02/01/16 1025 02/01/16 1026 02/01/16 1028  BP:      Pulse:      Temp:      TempSrc:      Resp:    16  Height:      Weight:      SpO2: 86% 91% 95% 99%    Wt Readings from Last 3 Encounters:  01/30/16 66.5 kg (146 lb 9.7 oz)  01/06/16 66.906 kg (147 lb 8 oz)  08/31/14 57.153 kg (126 lb)     Intake/Output Summary (Last 24 hours) at 02/01/16 1315 Last data filed at 01/31/16 2151  Gross per 24 hour  Intake  91.36 ml  Output      0 ml  Net  91.36 ml     Physical Exam  Awake however she is pleasantly confused Symmetrical Chest wall movement, Good air movement bilaterally, CTAB RRR,No Gallops,Rubs or new Murmurs, No Parasternal Heave +ve B.Sounds,  Abd Soft, No tenderness, No organomegaly appriciated, No rebound - guarding or rigidity. Follows commands    Data Review:    CBC  Recent Labs Lab 01/30/16 1535 01/31/16 0423 02/01/16 0541  WBC 7.7 6.8 9.6  HGB 10.7* 9.5* 10.0*  HCT 32.9* 29.4* 31.4*  PLT 158 137* 147*  MCV 88.7 89.6 86.0  MCH 28.8 29.0 27.4  MCHC 32.5 32.3 31.8  RDW 14.7 14.3 14.5  LYMPHSABS 1.8  --   --   MONOABS 1.1*  --   --   EOSABS 0.0  --   --   BASOSABS 0.0  --   --     Chemistries   Recent Labs Lab 01/30/16 1535  02/01/16 0541  NA 133* 139  K 4.0 4.1  CL 102 105  CO2 23 25  GLUCOSE 136* 160*  BUN 26* 22*  CREATININE 1.53* 1.45*  CALCIUM 8.6* 9.0  MG 1.8  --   AST 21  --   ALT 12*  --   ALKPHOS 71  --   BILITOT 0.6  --    ------------------------------------------------------------------------------------------------------------------  Recent Labs  01/31/16 0423  CHOL 130  HDL 49  LDLCALC 72  TRIG 46  CHOLHDL 2.7    Lab Results  Component Value Date   HGBA1C 6.9* 01/31/2016   ------------------------------------------------------------------------------------------------------------------ No results for input(s): TSH, T4TOTAL, T3FREE, THYROIDAB in the last 72 hours.  Invalid input(s): FREET3 ------------------------------------------------------------------------------------------------------------------ No results for input(s): VITAMINB12, FOLATE, FERRITIN, TIBC, IRON, RETICCTPCT in the last 72 hours.  Coagulation profile  Recent Labs Lab 01/30/16 2000 02/01/16 0541  INR 1.06 1.16    No results for input(s): DDIMER in the last 72 hours.  Cardiac Enzymes  Recent Labs Lab 01/30/16 1535  TROPONINI 3.67*   ------------------------------------------------------------------------------------------------------------------    Component Value Date/Time   BNP 252.5* 12/26/2015 0104    Micro Results Recent Results (from the past 240 hour(s))  Urine culture     Status: None   Collection Time: 01/30/16  6:30 PM  Result Value Ref Range Status   Specimen Description URINE, CATHETERIZED  Final   Special Requests NONE  Final   Culture NO GROWTH Performed at [facility]   Final   Report Status 02/01/2016 FINAL  Final    Radiology Reports Dg Chest 2 View  01/30/2016  CLINICAL DATA:  Confusion EXAM: CHEST  2 VIEW COMPARISON:  January 06, 2016 FINDINGS: There is no edema or consolidation. Heart is mildly enlarged with pulmonary vascularity within normal limits.  There is calcification in the mitral annulus. No adenopathy. Bones are osteoporotic. There is degenerative change in the thoracic spine. IMPRESSION: Persistent mild cardiac enlargement. No edema or consolidation. Bones osteoporotic. Electronically Signed   By: Lowella Grip III M.D.   On: 01/30/2016 16:25   Dg Chest 2 View  01/06/2016  CLINICAL DATA:  Confusion. Slurred speech. Congestive heart failure. EXAM: CHEST  2 VIEW COMPARISON:  12/26/2015 FINDINGS: Cardiomegaly is stable. Decreased diffuse interstitial infiltrates are seen, consistent with resolving interstitial edema. No evidence of pulmonary consolidation. Tiny pleural effusions are noted posteriorly. IMPRESSION: Interval decrease in diffuse interstitial edema since prior study, consistent with resolving congestive heart failure. Electronically Signed   By: Earle Gell M.D.   On: 01/06/2016 12:36   Ct Head Wo Contrast  01/30/2016  CLINICAL DATA:  Confusion with gait disturbance EXAM: CT HEAD WITHOUT CONTRAST TECHNIQUE: Contiguous axial images were obtained from the base of the skull through the vertex without intravenous contrast. COMPARISON:  December 31, 2015 FINDINGS: Mild to moderate diffuse atrophy is stable. There is no intracranial mass, hemorrhage, extra-axial fluid collection, or midline shift. Patchy small vessel disease in the centra semiovale bilaterally is stable. There is evidence of a prior small infarct in the posterior aspect of the posterior limb of the left internal capsule, stable. There is no new gray-white compartment lesion. No acute infarct evident. The bony calvarium appears intact. The mastoid air cells are clear. Visualized orbits appear symmetric bilaterally. IMPRESSION: Atrophy with periventricular small vessel disease. Prior small infarct in the posterior aspect of the posterior limb of the internal capsule on the left, stable. No acute infarct evident. No hemorrhage or mass effect. Electronically Signed   By: Lowella Grip III M.D.   On: 01/30/2016 16:27   Mr Brain Wo Contrast (neuro Protocol)  01/30/2016  CLINICAL DATA:  Confusion and unsteady gait. Personal history of colon cancer. EXAM: MRI HEAD WITHOUT CONTRAST TECHNIQUE: Multiplanar, multiecho pulse sequences of the brain and surrounding structures were obtained without intravenous contrast. COMPARISON:  MRI brain and MRA head 12/27/2015. CT head without contrast 01/30/2016. FINDINGS: In addition to the multiple punctate previously-seen bilateral cerebral and cerebellar infarcts, several new punctate foci of restricted diffusion in acute infarction are noted. Most prominent areas are in the left basal ganglia and right caudate head. There is a punctate focus the anterior right frontal lobe and medial right parietal lobe. New bilateral punctate lesions are present in the occipital poles and the posterior right insular cortex. There are new foci of acute nonhemorrhagic infarction involving the medial posterior temporal lobes and bilateral cerebellum, left greater than right. These white matter changes are associated with these lesions in addition to multiple other more remote ischemic lesions bilaterally. Moderate generalized atrophy is again noted. The ventricles are proportionate to the degree of atrophy. The internal auditory canals are within normal limits bilaterally. Flow is present in the major intracranial arteries. Bilateral lens replacements are present. Polyps or mucous retention cysts are noted along the inferior aspect of the right maxillary sinus without change. The paranasal sinuses are otherwise clear. There is some fluid in the mastoid air cells bilaterally. No obstructing nasopharyngeal lesion is evident. IMPRESSION: 1. Multiple additional acute nonhemorrhagic punctate acute infarcts throughout both cerebral hemispheres and cerebellum. Watershed infarcts are previously suggested. These may again be watershed infarcts. Multiple embolic foci from a  cardiac arrhythmia is also considered. 2. Multiple previous subacute infarcts are again seen. 3. Extensive white matter disease is present in both hemispheres. 4. Multiple remote lacunar infarcts are also noted, stable. 5. Right maxillary sinus disease is stable. Electronically Signed   By: San Morelle M.D.   On: 01/30/2016 18:30    Time Spent in minutes  [place] DO on 02/01/2016 at 1:15 PM  Between 7am to 7pm - Pager - [phone]  After 7pm go to [email or web address] - password [facility]  [facility] -  Office  [phone]

## 2016-02-01 NOTE — Progress Notes (Signed)
   02/01/16 1400  PT Visit Information  Last PT Received On 02/01/16  Reason Eval/Treat Not Completed Patient at procedure or test/unavailable  Mee Hives, PT MS Acute Rehab Dept. Number: [place] [facility] [place]

## 2016-02-01 NOTE — CV Procedure (Signed)
    TEE - Stroke  Time out performed  During this procedure the patient is administered a total of Versed 1 mg and no Fentanyl to achieve and maintain moderate conscious sedation.  The patient's heart rate, blood pressure, and oxygen saturation are monitored continuously during the procedure. The period of conscious sedation is 15 minutes, of which I was present face-to-face 100% of this time.  Findings:  No embolic source Normal EF Mild MR Mild pulmonic regurg Negative bubble study  Reassuring  Candee Furbish, MD

## 2016-02-01 NOTE — Progress Notes (Signed)
ANTICOAGULATION CONSULT NOTE - Follow Up Consult  Pharmacy Consult for Heparin -> Eliquis Indication: atrial fibrillation and stroke  Allergies  Allergen Reactions  . Codeine Nausea Only    Patient Measurements: Height: 5\' 2"  (157.5 cm) Weight: 146 lb 9.7 oz (66.5 kg) IBW/kg (Calculated) : 50.1 Heparin dosing weight: 66.5 kg  Vital Signs: Temp: 98.2 F (36.8 C) (05/19 0900) Temp Source: Axillary (05/19 0900) BP: 158/67 mmHg (05/19 0900) Pulse Rate: 66 (05/19 0900)  Labs:  Recent Labs  01/30/16 1535 01/30/16 2000 01/31/16 0422 01/31/16 0423 02/01/16 0541  HGB 10.7*  --   --  9.5* 10.0*  HCT 32.9*  --   --  29.4* 31.4*  PLT 158  --   --  137* 147*  APTT  --  30  --   --   --   LABPROT  --  14.0  --   --  15.0  INR  --  1.06  --   --  1.16  HEPARINUNFRC  --   --  0.37  --  0.56  CREATININE 1.53*  --   --   --  1.45*  TROPONINI 3.67*  --   --   --   --     Estimated Creatinine Clearance: 26.3 mL/min (by C-G formula based on Cr of 1.45).  Assessment:   82 yr old female with recurrent stroke and atrial fibrillation. For TEE later today.   Heparin level therapeutic this morning (0.56) on 1150 units/hr.   CBC stable.  To change to Eliquis.  Technically qualifies for 5 mg BID today, with creatinine 1.45, 82 yrs old and 66.5 kg.   Criteria for low-dose Eliquis are 2 of the 3 criteria: age > 80, < 60 kg and creatinine >1.5.  Creatinine 1.53 yesterday, which would have qualified her for low-dose. Creatinine range 1.17-2.0 over the last month.      Also on ASA 81 mg daily.  Noted fall risk, due to legally blind.   Goal of Therapy:  Heparin level 0.3-0.7 units/ml appropriate Eliquis dose for indication Monitor platelets by anticoagulation protocol: Yes   Plan:   Eliquis 2.5 mg PO BID as suggested per Neurology. Low-dose with age > 43 and creatinine borderline ~1.5, and fall risk.  IV heparin stopped at the time of 1st Eliquis dose.  Intermittent CBC.  Follow up TEE.  Decrease Celexa from 40 to 20 mg daily?  20 mg is max recommended if >60 yrs old, due to risk of QT-prolongation. QTc up on 01/30/16 EKG.  Arty Baumgartner, [place] Pager: [phone] 02/01/2016,11:56 AM

## 2016-02-01 NOTE — Progress Notes (Signed)
Subjective: Sleepy, did not awaken   Objective: Vital signs in last 24 hours: Temp:  [98.2 F (36.8 C)-99.1 F (37.3 C)] 98.2 F (36.8 C) (05/19 0653) Pulse Rate:  [52-70] 65 (05/19 0653) Resp:  [15-18] 16 (05/19 0653) BP: (84-188)/(46-83) 188/83 mmHg (05/19 0653) SpO2:  [95 %-98 %] 95 % (05/19 0653) Weight change:  Last BM Date: 01/28/16 (she thinks 3 days ago) Intake/Output from previous day: +346 05/18 0701 - 05/19 0700 In: 346.4 [P.O.:255; I.V.:91.4] Out: -  Intake/Output this shift:    PE: General:Pleasant affect, NAD Skin:Warm and dry, brisk capillary refill HEENT:normocephalic, sclera clear, mucus membranes moist Heart:S1S2 RRR without murmur, gallup, rub or click Lungs:clear without rales, rhonchi, or wheezes [id number], non tender, + BS, do not palpate liver spleen or masses Ext:no lower ext edema, 2+ pedal pulses, 2+ radial pulses Neuro:sleepy, MAE, + facial symmetry Tele: SR to SB in 50s with PVCs  Lab Results:  Recent Labs  01/31/16 0423 02/01/16 0541  WBC 6.8 9.6  HGB 9.5* 10.0*  HCT 29.4* 31.4*  PLT 137* 147*   BMET  Recent Labs  01/30/16 1535  NA 133*  K 4.0  CL 102  CO2 23  GLUCOSE 136*  BUN 26*  CREATININE 1.53*  CALCIUM 8.6*    Recent Labs  01/30/16 1535  TROPONINI 3.67*    Lab Results  Component Value Date   CHOL 130 01/31/2016   HDL 49 01/31/2016   LDLCALC 72 01/31/2016   TRIG 46 01/31/2016   CHOLHDL 2.7 01/31/2016   Lab Results  Component Value Date   HGBA1C 6.9* 01/31/2016     Lab Results  Component Value Date   TSH 3.480 07/25/2014    Hepatic Function Panel  Recent Labs  01/30/16 1535  PROT 6.8  ALBUMIN 3.6  AST 21  ALT 12*  ALKPHOS 71  BILITOT 0.6    Recent Labs  01/31/16 0423  CHOL 130   No results for input(s): PROTIME in the last 72 hours.     Studies/Results: Dg Chest 2 View  01/30/2016  CLINICAL DATA:  Confusion EXAM: CHEST  2 VIEW COMPARISON:  January 06, 2016 FINDINGS:  There is no edema or consolidation. Heart is mildly enlarged with pulmonary vascularity within normal limits. There is calcification in the mitral annulus. No adenopathy. Bones are osteoporotic. There is degenerative change in the thoracic spine. IMPRESSION: Persistent mild cardiac enlargement. No edema or consolidation. Bones osteoporotic. Electronically Signed   By: Lowella Grip III M.D.   On: 01/30/2016 16:25   Ct Head Wo Contrast  01/30/2016  CLINICAL DATA:  Confusion with gait disturbance EXAM: CT HEAD WITHOUT CONTRAST TECHNIQUE: Contiguous axial images were obtained from the base of the skull through the vertex without intravenous contrast. COMPARISON:  December 31, 2015 FINDINGS: Mild to moderate diffuse atrophy is stable. There is no intracranial mass, hemorrhage, extra-axial fluid collection, or midline shift. Patchy small vessel disease in the centra semiovale bilaterally is stable. There is evidence of a prior small infarct in the posterior aspect of the posterior limb of the left internal capsule, stable. There is no new gray-white compartment lesion. No acute infarct evident. The bony calvarium appears intact. The mastoid air cells are clear. Visualized orbits appear symmetric bilaterally. IMPRESSION: Atrophy with periventricular small vessel disease. Prior small infarct in the posterior aspect of the posterior limb of the internal capsule on the left, stable. No acute infarct evident. No hemorrhage or mass effect. Electronically  Signed   By: Lowella Grip III M.D.   On: 01/30/2016 16:27   Mr Brain Wo Contrast (neuro Protocol)  01/30/2016  CLINICAL DATA:  Confusion and unsteady gait. Personal history of colon cancer. EXAM: MRI HEAD WITHOUT CONTRAST TECHNIQUE: Multiplanar, multiecho pulse sequences of the brain and surrounding structures were obtained without intravenous contrast. COMPARISON:  MRI brain and MRA head 12/27/2015. CT head without contrast 01/30/2016. FINDINGS: In addition to the  multiple punctate previously-seen bilateral cerebral and cerebellar infarcts, several new punctate foci of restricted diffusion in acute infarction are noted. Most prominent areas are in the left basal ganglia and right caudate head. There is a punctate focus the anterior right frontal lobe and medial right parietal lobe. New bilateral punctate lesions are present in the occipital poles and the posterior right insular cortex. There are new foci of acute nonhemorrhagic infarction involving the medial posterior temporal lobes and bilateral cerebellum, left greater than right. These white matter changes are associated with these lesions in addition to multiple other more remote ischemic lesions bilaterally. Moderate generalized atrophy is again noted. The ventricles are proportionate to the degree of atrophy. The internal auditory canals are within normal limits bilaterally. Flow is present in the major intracranial arteries. Bilateral lens replacements are present. Polyps or mucous retention cysts are noted along the inferior aspect of the right maxillary sinus without change. The paranasal sinuses are otherwise clear. There is some fluid in the mastoid air cells bilaterally. No obstructing nasopharyngeal lesion is evident. IMPRESSION: 1. Multiple additional acute nonhemorrhagic punctate acute infarcts throughout both cerebral hemispheres and cerebellum. Watershed infarcts are previously suggested. These may again be watershed infarcts. Multiple embolic foci from a cardiac arrhythmia is also considered. 2. Multiple previous subacute infarcts are again seen. 3. Extensive white matter disease is present in both hemispheres. 4. Multiple remote lacunar infarcts are also noted, stable. 5. Right maxillary sinus disease is stable. Electronically Signed   By: San Morelle M.D.   On: 01/30/2016 18:30    Medications: I have reviewed the patient's current medications. Scheduled Meds: . aspirin  81 mg Oral Daily  .  atorvastatin  10 mg Oral Daily  . cholestyramine  4 g Oral Daily  . citalopram  40 mg Oral Daily  . insulin aspart  0-9 Units Subcutaneous TID WC  . mirtazapine  15 mg Oral QHS  . OLANZapine  2.5 mg Oral QHS  . sodium chloride flush  3 mL Intravenous Q12H  . vitamin B-12  2,000 mcg Oral Daily   Continuous Infusions: . heparin 1,150 Units/hr (01/31/16 1822)   PRN Meds:.sodium chloride, sodium chloride flush  Assessment/Plan: Principal Problem:   Stroke ([place]) Active Problems:   Mixed hyperlipidemia   Essential hypertension, benign   Type 2 diabetes mellitus (HCC)   Gait abnormality   CHF (congestive heart failure) (HCC)   Legal blindness   PAF (paroxysmal atrial fibrillation) (HCC)   Vascular dementia  Recurrent stroke:  Neuro also feels anticoagulation is appropriate at this time with 2 episodes of watershed type CVA - may have PAF, for TEE today at 3 pm to rule out clot.  Discussed risks of TEE with daughter Jhonnie Garner POA.   Possible PAF but if to begin anticoagulation anyway no need for loop, may do 30 day event monitor as outpt.  S brady  HR improved off BB.    Elevated troponin with very minimal CAD on cath.  Elevation may be due to stroke.  Minimal CAD with cath last  month.      LOS: 2 days   Time spent with pt. : 15 minutes. Cecilie Kicks  Nurse Practitioner Certified Pager [phone] or after 5pm and on weekends call [phone] 02/01/2016, 7:52 AM   I have seen, examined and evaluated the patient this Morning along with Cecilie Kicks, NP-C.Marland Kitchen  After reviewing all the available data and chart, we discussed the patients laboratory, study & physical findings as well as symptoms in detail. I agree with her findings, examination as well as impression recommendations as per our discussion.    No signs of A. fib on telemetry. Still has sinus bradycardia. We are holding her beta blocker. TEE scheduled for today. No thrombus noted on initial review.  Regardless, symptoms  are probably consistent with potential thromboembolic event, and given history of possible A. fib in the past, I agree with anticoagulation with DOAC therapy.  She was somewhat confused last saw her this morning - not oriented to person and place. At this point. She does not really have any active cardiac issues. Would continue to monitor on telemetry, however with no active cardiac issues we will sign off unless there are recurrence of any arrhythmias. Would continue the recommendations for endocrine relation with DOAC.  Agree that there is no need for loop recorder. We can consider an outpatient 30 day monitor.  This can be arranged on discharge. Please let us know when she is scheduled for discharge.    Glenetta Hew, M.D., M.S. Interventional Cardiologist   Pager # [phone] Phone # [phone] [address]. [place],  [address]

## 2016-02-01 NOTE — Progress Notes (Signed)
CM spoke with Dr Eliseo Squires, who requested that a benefits check be completed for Eliquis and Xarelto.  Benefits check initiated. Awaiting results.  Lorne Skeens RN, MSN [phone]

## 2016-02-01 NOTE — Progress Notes (Signed)
Benefits check results:  Per rep at [facility]:   Eliquis: $24 for 30 day retail/ no auth required  Xarelto: $24 for 30 day retail/ no auth required   Patient can use any pharmacy EXCEPT [facility].   Also, patient can receive medication for $0 from [place] with [place] Issued prescription    Lorne Skeens RN, MSN [phone]

## 2016-02-01 NOTE — Progress Notes (Signed)
Patient has been started on Eliquis.  CM attempted to meet with patient, who was sleeping soundly. No family at bedside.  Eliquis 30 day free card was left at bedside. RN updated.  Lorne Skeens RN, MSN [phone]

## 2016-02-01 NOTE — Progress Notes (Signed)
Pt back from endo.  Pt extremely lethargic still. No change since before endo procedure. Pt did wake up to notes of physical stimulation, said her name was "Mini?", daughter was unsure what name pt was trying to say, but upon further questioning pt able to say her name was "Laura Parks".   Daughter was asking about what would be done if pt did not wake up more or become more alert. Daughter open to idea of SNF for rehab until pt becomes more alert.

## 2016-02-01 NOTE — Progress Notes (Signed)
Patient arrived here from [facility] where the initial first 24 hours of her stroke work up was completed, she is very confused and not able to recall or remember any aspects of her stroke education, will need to educate her family she is here for a TEE and continued stroke/caediac care.

## 2016-02-01 NOTE — Progress Notes (Signed)
Daughter was concerned that pt did not get a bath today. rn assured daugther that pt received a bath, rn witnessed pt getting a bath by the tech.

## 2016-02-01 NOTE — Progress Notes (Signed)
rn called pts daughter and POA- Jhonnie Garner, Faith[place]Marie [place], rn and [place], [place] both received verbal consent for pt to have TEE. Consent signed by both nurses and placed in chart.

## 2016-02-02 DIAGNOSIS — R269 Unspecified abnormalities of gait and mobility: Secondary | ICD-10-CM

## 2016-02-02 DIAGNOSIS — E782 Mixed hyperlipidemia: Secondary | ICD-10-CM

## 2016-02-02 LAB — CBC
HCT: 30.2 % — ABNORMAL LOW (ref 36.0–46.0)
HEMOGLOBIN: 9.4 g/dL — AB (ref 12.0–15.0)
MCH: 27.2 pg (ref 26.0–34.0)
MCHC: 31.1 g/dL (ref 30.0–36.0)
MCV: 87.3 fL (ref 78.0–100.0)
Platelets: 143 10*3/uL — ABNORMAL LOW (ref 150–400)
RBC: 3.46 MIL/uL — AB (ref 3.87–5.11)
RDW: 14.5 % (ref 11.5–15.5)
WBC: 7.3 10*3/uL (ref 4.0–10.5)

## 2016-02-02 LAB — GLUCOSE, CAPILLARY
Glucose-Capillary: 114 mg/dL — ABNORMAL HIGH (ref 65–99)
Glucose-Capillary: 240 mg/dL — ABNORMAL HIGH (ref 65–99)

## 2016-02-02 LAB — BASIC METABOLIC PANEL
ANION GAP: 11 (ref 5–15)
BUN: 21 mg/dL — ABNORMAL HIGH (ref 6–20)
CHLORIDE: 107 mmol/L (ref 101–111)
CO2: 23 mmol/L (ref 22–32)
Calcium: 8.7 mg/dL — ABNORMAL LOW (ref 8.9–10.3)
Creatinine, Ser: 1.3 mg/dL — ABNORMAL HIGH (ref 0.44–1.00)
GFR calc non Af Amer: 37 mL/min — ABNORMAL LOW (ref >60)
GFR, EST AFRICAN AMERICAN: 43 mL/min — AB (ref >60)
GLUCOSE: 100 mg/dL — AB (ref 65–99)
Potassium: 4.3 mmol/L (ref 3.5–5.1)
Sodium: 141 mmol/L (ref 135–145)

## 2016-02-02 MED ORDER — ASPIRIN 81 MG PO CHEW: 81.0000 mg | CHEWABLE_TABLET | Freq: Every day | ORAL | Status: DC

## 2016-02-02 MED ORDER — APIXABAN 2.5 MG PO TABS: 2.5000 mg | ORAL_TABLET | Freq: Two times a day (BID) | ORAL | Status: DC

## 2016-02-02 MED ORDER — CITALOPRAM HYDROBROMIDE 20 MG PO TABS: 20.0000 mg | ORAL_TABLET | Freq: Every day | ORAL | Status: DC

## 2016-02-02 NOTE — Progress Notes (Signed)
rn removed foley, pt had to void prior to discharge. Pt voided without problem.

## 2016-02-02 NOTE — Care Management Note (Addendum)
Case Management Note  Patient Details  Name: Laura Parks MRN: [id number] Date of Birth: 04-17-1930  Subjective/Objective:                  Principal Problem:  Stroke [facility])  Action/Plan: Cm called and spoke with daughter Jhonnie Garner @ [phone]. She said that patient was already active with [facility] and they have been very pleased with them so want to use them again. Cm called Tiffany with [facility] to advise of need for [facility] PT/OT/RN as a resumption. Daughter states that patient already has a walker and all DME. Daughter and patient's husband are able to cook for her and bathe her so no HH aide needed.  No further CM needs communicated.   Expected Discharge Date:                  Expected Discharge Plan:  [place]  In-House Referral:     Discharge planning Services  CM Consult, Medication Assistance  Post Acute Care Choice:  Home Health Choice offered to:  Patient, [facility] POA / Guardian, Adult Children  DME Arranged:    DME Agency:     HH Arranged:  RN, PT, OT HH Agency:  [place]  Status of Service:  Completed, signed off  Medicare Important Message Given:    Date Medicare IM Given:    Medicare IM give by:    Date Additional Medicare IM Given:    Additional Medicare Important Message give by:     If discussed at [place] of Stay Meetings, dates discussed:    Additional Comments:  Guido Sander, RN 02/02/2016, 2:47 PM

## 2016-02-02 NOTE — Evaluation (Signed)
Speech Language Pathology Evaluation Patient Details Name: Laura Parks MRN: [id number] DOB: 27-Jul-1930 Today's Date: 02/02/2016 Time: [id number] SLP Time Calculation (min) (ACUTE ONLY): 21 min  Problem List:  Patient Active Problem List   Diagnosis Date Noted  . Cerebrovascular accident (CVA) ([place])   . Cerebral infarction (hemorrhagic or thromboembolic)   . Acute delirium 12/31/2015  . Vascular dementia 12/31/2015  . PAF (paroxysmal atrial fibrillation) ([place])   . Palliative care encounter   . Acute renal failure ([place])   . Altered mental status   . Stroke ([place])   . NSTEMI (non-ST elevated myocardial infarction) ([place])   . Hyperkalemia 12/27/2015  . CHF (congestive heart failure) ([place]) 12/27/2015  . Acute encephalopathy 12/27/2015  . TIA (transient ischemic attack) 12/27/2015  . AKI (acute kidney injury) ([place]) 12/27/2015  . Dehydration 12/27/2015  . Legal blindness 12/27/2015  . B12 deficiency 09/02/2014  . Gait abnormality 07/26/2014  . Delusions ([place]) 07/26/2014  . Depression 07/26/2014  . Cognitive impairment   . Type 2 diabetes mellitus ([place]) 02/02/2014  . Carotid artery occlusion 02/02/2014  . PVC's (premature ventricular contractions) 02/02/2014  . Mixed hyperlipidemia 02/23/2012  . Essential hypertension, benign 02/23/2012   Past Medical History:  Past Medical History  Diagnosis Date  . Type 2 diabetes mellitus ([place])   . Colon cancer ([place])   . Anemia   . Essential hypertension, benign   . Mixed hyperlipidemia   . Macular degeneration   . History of pneumonia   . Iron deficiency anemia     Erosive antral gastritis  . Cognitive impairment   . Coronary artery disease 01/04/2016    "Angiographically minimal coronary disease. No culprit lesion to explain significant elevation in troponin"  . CHF (congestive heart failure) ([place])   . Stroke ([place]) 01/2016    watershed infract  . Legal blindness    Past Surgical History:  Past Surgical History  Procedure  Laterality Date  . Cataract extraction    . Cholecystectomy    . Appendectomy    . Right knee surgery    . Colon surgery for colon cancer      1992 by Dr Mendel Ryder  . Tubal ligation    . Colonoscopy  03/24/2012    Procedure: COLONOSCOPY;  Surgeon: Rogene Houston, MD;  Location[facility];  Service: Endoscopy;  Laterality: N/A;  1200  . Esophagogastroduodenoscopy  08/04/2012    Procedure: ESOPHAGOGASTRODUODENOSCOPY (EGD);  Surgeon: Rogene Houston, MD;  Location: [facility];  Service: Endoscopy;  Laterality: N/A;  325  . Cardiac catheterization N/A 01/04/2016    Procedure: Left Heart Cath and Coronary Angiography;  Surgeon: Leonie Man, MD;  Location: [facility] CV LAB;  Service: Cardiovascular;  Laterality: N/A;   HPI:  82 yo F admitted with increased confusion, and MRI showing acute watershet infarcts in the setting of CVA x 1 month ago. PMH: PAF, DM, dementia, CVA, colon CA, anemia, HTN, CAD, R knee surgery, colon surgery, cholecystectomy, appendectomy   Assessment / Plan / Recommendation Clinical Impression  Pt pleasantly confused. Pt also has vision problems that interfered with evaluation. At baseline pt has dementia and lives at with her husband and son. Pt's deficits continue to be her short term and long term memory and are related to her underlying dementia. She has no obvious aphasia, dyarthria or apraxia. She reports having no difficulty communicating with the staff her wants and needs and her RN confirmed this. Pt would benefit with reorienting to call bell  each time staff is in the room due to her memory deficits and vision deficits. Speech therapy is not recommended at this time. Pt is felt to have returned to her cognitive baseline. Speech therapy signing off.     SLP Assessment  Patient does not need any further Speech Lanaguage Pathology Services    Follow Up Recommendations       Frequency and Duration           SLP Evaluation Prior Functioning   Cognitive/Linguistic Baseline: Baseline deficits Baseline deficit details: Pt has dementia. Type of Home: House  Lives With: Spouse Available Help at Discharge: Family;Available 24 hours/day Vocation: Retired   Associate Professor  Overall Cognitive Status: History of cognitive impairments - at baseline Arousal/Alertness: Awake/alert Orientation Level: Oriented to person;Oriented to place;Oriented to situation Attention: Focused;Sustained Focused Attention: Appears intact Memory: Impaired Memory Impairment: Decreased long term memory;Decreased short term memory Awareness: Appears intact Problem Solving: Impaired Problem Solving Impairment: Verbal complex Safety/Judgment: Impaired    Comprehension  Auditory Comprehension Overall Auditory Comprehension: Impaired at baseline Yes/No Questions: Within Functional Limits Conversation: Simple Interfering Components: Working Field seismologist: Theme park manager Discrimination: Within Function Limits    Expression Expression Primary Mode of Expression: Verbal Verbal Expression Overall Verbal Expression: Appears within functional limits for tasks assessed Initiation: No impairment Repetition: No impairment Naming: No impairment Pragmatics: No impairment Written Expression Dominant Hand: Right Written Expression: Not tested   Oral / Motor  Oral Motor/Sensory Function Overall Oral Motor/Sensory Function: Within functional limits Motor Speech Overall Motor Speech: Appears within functional limits for tasks assessed Respiration: Within functional limits Phonation: Normal Resonance: Within functional limits Articulation: Within functional limitis Intelligibility: Intelligible Motor Planning: Witnin functional limits Motor Speech Errors: Not applicable Interfering Components: Premorbid status   GO                    Charlynne Cousins Kyndel Egger, MA, CCC-SLP 02/02/2016  10:34 AM

## 2016-02-02 NOTE — Discharge Summary (Signed)
Physician Discharge Summary  Laura Parks [id number] DOB: 03-Sep-1930 DOA: 01/30/2016  PCP: Glenda Chroman, MD  Admit date: 01/30/2016 Discharge date: 02/02/2016   Recommendations for Outpatient Follow-Up:   1. Home health with 24 hour supervision 2. Bowel regimen to avoid constipation   Discharge Diagnosis:   Principal Problem:   Stroke [facility]) Active Problems:   Mixed hyperlipidemia   Essential hypertension, benign   Type 2 diabetes mellitus (HCC)   Gait abnormality   CHF (congestive heart failure) (HCC)   Legal blindness   PAF (paroxysmal atrial fibrillation) ([place])   Vascular dementia   Cerebrovascular accident (CVA) [facility])   Discharge disposition:  Home with 24 hour supervision  Discharge Condition: Improved.  Diet recommendation: Low sodium, heart healthy  Wound care: None.   History of Present Illness:   82 yo female h/o pafib, dm, dementia hospitalized last month at [facility] with a stroke/trop over 8 had heart cath that showed no significant CAD and mri showing multiple infarcts placed on full dose of aspirin. At that time it was thought not to do a TEE as patient was not deemed an anticoagulation candidate. Family brought pt in again tonight for acting strange and confused. Family not available right now. Pt has mild dementia, she knows what year it is. She still ambulates. She denies any pain, but she does not recall specifics of what happened during her stay at [facility] last month. She says her daughter makes most of the decisiions for her. She lives with her husband. Pt deniesa ny recent illnesses. No weakness/numbness/tingling anywhere. No fevers. However im sure her history is not fully reliable. Pt found to have more watershed infarcts on her mri today, referred for admission for further strokes in full dose of aspirin only. Pt has not complaints right now and is pleasantly demented   Hospital Course by Problem:   Stroke: Multiple bilateral anterior  and posterior circulation infarcts, embolic secondary to cardiac source, highly suspicious for afib although has not confirmed yet.   MRI Multiple punctate B anterior and posterior circulation infarcts in setting of subacute multiple B embolic infarcts.  TEE Study Conclusions  - Left ventricle: Systolic function was normal. The estimated  ejection fraction was in the range of 60% to 65%. Wall motion was  normal; there were no regional wall motion abnormalities. - Aortic valve: Trileaflet; normal thickness, mildly calcified  leaflets. - Mitral valve: Mildly calcified annulus. There was mild  regurgitation. - Left atrium: No evidence of thrombus in the atrial cavity or  appendage. No evidence of thrombus in the atrial cavity or  appendage. - Right atrium: No evidence of thrombus in the atrial cavity or  appendage. - Atrial septum: No defect or patent foramen ovale was identified.  Echo contrast study showed no right-to-left atrial level shunt,  following an increase in RA pressure induced by provocative maneuvers.  MRA 12/2015 moderate stenosis of right cavernous ICA  Carotid Doppler 4.2017 No significant stenosis  2D Echo 12/2015 EF 60-65% with no source of embolus.  LE dopplers 12/2015 negative   LDL 72  HgbA1c 6.9 - ASA and eliquis. Continue eliquis on discharge for stroke prevention  HH PT  Followed with Dr. Jaynee Eagles on 02/06/16 as scheduled  Hypertension Stable Permissive hypertension (OK if < 220/120) but gradually normalize in 5-7 days  Hyperlipidemia Home meds: lipitor 10, resumed in hospital LDL 72, goal < 70 Continue statin at discharge  Diabetes HgbA1c 6.9, at goal < 7.0 Controlled  NSTEMI -  chest pain free, EKG with no new changes, she had massive elevation of troponin last admission with a unremarkable left heart cath and a diagnosis of Takotsubo cardiomyopathy -beta blocker and statin for secondary prevention. Cardiology following  CKD 4 -  baseline creatinine close to 1.4. Monitor.     Medical Consultants:    Cards  neuro   Discharge Exam:   Filed Vitals:   02/02/16 0911 02/02/16 1324  BP: 139/49 125/56  Pulse: 75 60  Temp: 98.4 F (36.9 C) 98.4 F (36.9 C)  Resp: 16 16   Filed Vitals:   02/02/16 0800 02/02/16 0900 02/02/16 0911 02/02/16 1324  BP:   139/49 125/56  Pulse:  70 75 60  Temp:   98.4 F (36.9 C) 98.4 F (36.9 C)  TempSrc:   Oral Oral  Resp: 17 18 16 16   Height:      Weight:      SpO2: 95% 99% 94% 96%    Gen:  NAD- pleasantly confused, family at bedside anxious to go home    The results of significant diagnostics from this hospitalization (including imaging, microbiology, ancillary and laboratory) are listed below for reference.     Procedures and Diagnostic Studies:   Dg Chest 2 View  01/30/2016  CLINICAL DATA:  Confusion EXAM: CHEST  2 VIEW COMPARISON:  January 06, 2016 FINDINGS: There is no edema or consolidation. Heart is mildly enlarged with pulmonary vascularity within normal limits. There is calcification in the mitral annulus. No adenopathy. Bones are osteoporotic. There is degenerative change in the thoracic spine. IMPRESSION: Persistent mild cardiac enlargement. No edema or consolidation. Bones osteoporotic. Electronically Signed   By: Lowella Grip III M.D.   On: 01/30/2016 16:25   Ct Head Wo Contrast  01/30/2016  CLINICAL DATA:  Confusion with gait disturbance EXAM: CT HEAD WITHOUT CONTRAST TECHNIQUE: Contiguous axial images were obtained from the base of the skull through the vertex without intravenous contrast. COMPARISON:  December 31, 2015 FINDINGS: Mild to moderate diffuse atrophy is stable. There is no intracranial mass, hemorrhage, extra-axial fluid collection, or midline shift. Patchy small vessel disease in the centra semiovale bilaterally is stable. There is evidence of a prior small infarct in the posterior aspect of the posterior limb of the left internal capsule,  stable. There is no new gray-white compartment lesion. No acute infarct evident. The bony calvarium appears intact. The mastoid air cells are clear. Visualized orbits appear symmetric bilaterally. IMPRESSION: Atrophy with periventricular small vessel disease. Prior small infarct in the posterior aspect of the posterior limb of the internal capsule on the left, stable. No acute infarct evident. No hemorrhage or mass effect. Electronically Signed   By: Lowella Grip III M.D.   On: 01/30/2016 16:27   Mr Brain Wo Contrast (neuro Protocol)  01/30/2016  CLINICAL DATA:  Confusion and unsteady gait. Personal history of colon cancer. EXAM: MRI HEAD WITHOUT CONTRAST TECHNIQUE: Multiplanar, multiecho pulse sequences of the brain and surrounding structures were obtained without intravenous contrast. COMPARISON:  MRI brain and MRA head 12/27/2015. CT head without contrast 01/30/2016. FINDINGS: In addition to the multiple punctate previously-seen bilateral cerebral and cerebellar infarcts, several new punctate foci of restricted diffusion in acute infarction are noted. Most prominent areas are in the left basal ganglia and right caudate head. There is a punctate focus the anterior right frontal lobe and medial right parietal lobe. New bilateral punctate lesions are present in the occipital poles and the posterior right insular cortex. There are new foci  of acute nonhemorrhagic infarction involving the medial posterior temporal lobes and bilateral cerebellum, left greater than right. These white matter changes are associated with these lesions in addition to multiple other more remote ischemic lesions bilaterally. Moderate generalized atrophy is again noted. The ventricles are proportionate to the degree of atrophy. The internal auditory canals are within normal limits bilaterally. Flow is present in the major intracranial arteries. Bilateral lens replacements are present. Polyps or mucous retention cysts are noted along  the inferior aspect of the right maxillary sinus without change. The paranasal sinuses are otherwise clear. There is some fluid in the mastoid air cells bilaterally. No obstructing nasopharyngeal lesion is evident. IMPRESSION: 1. Multiple additional acute nonhemorrhagic punctate acute infarcts throughout both cerebral hemispheres and cerebellum. Watershed infarcts are previously suggested. These may again be watershed infarcts. Multiple embolic foci from a cardiac arrhythmia is also considered. 2. Multiple previous subacute infarcts are again seen. 3. Extensive white matter disease is present in both hemispheres. 4. Multiple remote lacunar infarcts are also noted, stable. 5. Right maxillary sinus disease is stable. Electronically Signed   By: [place] More[place] M.D.   On: 01/30/2016 18:30     Labs:   Basic Metabolic Panel:  Recent Labs Lab 01/30/16 1535 02/01/16 0541 02/02/16 0507  NA 133* 139 141  K 4.0 4.1 4.3  CL 102 105 107  CO2 23 25 23   GLUCOSE 136* 160* 100*  BUN 26* 22* 21*  CREATININE 1.53* 1.45* 1.30*  CALCIUM 8.6* 9.0 8.7*  MG 1.8  --   --    GFR Estimated Creatinine Clearance: 29.3 mL/min (by C-G formula based on Cr of 1.3). Liver Function Tests:  Recent Labs Lab 01/30/16 1535  AST 21  ALT 12*  ALKPHOS 71  BILITOT 0.6  PROT 6.8  ALBUMIN 3.6   No results for input(s): LIPASE, AMYLASE in the last 168 hours. No results for input(s): AMMONIA in the last 168 hours. Coagulation profile  Recent Labs Lab 01/30/16 2000 02/01/16 0541  INR 1.06 1.16    CBC:  Recent Labs Lab 01/30/16 1535 01/31/16 0423 02/01/16 0541 02/02/16 0507  WBC 7.7 6.8 9.6 7.3  NEUTROABS 4.9  --   --   --   HGB 10.7* 9.5* 10.0* 9.4*  HCT 32.9* 29.4* 31.4* 30.2*  MCV 88.7 89.6 86.0 87.3  PLT 158 137* 147* 143*   Cardiac Enzymes:  Recent Labs Lab 01/30/16 1535  TROPONINI 3.67*   BNP: Invalid input(s): POCBNP CBG:  Recent Labs Lab 02/01/16 1121 02/01/16 1701  02/01/16 2157 02/02/16 0606 02/02/16 1117  GLUCAP 124* 92 199* 114* 240*   D-Dimer No results for input(s): DDIMER in the last 72 hours. Hgb A1c  Recent Labs  01/31/16 0423  HGBA1C 6.9*   Lipid Profile  Recent Labs  01/31/16 0423  CHOL 130  HDL 49  LDLCALC 72  TRIG 46  CHOLHDL 2.7   Thyroid function studies No results for input(s): TSH, T4TOTAL, T3FREE, THYROIDAB in the last 72 hours.  Invalid input(s): FREET3 Anemia work up No results for input(s): VITAMINB12, FOLATE, FERRITIN, TIBC, IRON, RETICCTPCT in the last 72 hours. Microbiology Recent Results (from the past 240 hour(s))  Urine culture     Status: None   Collection Time: 01/30/16  6:30 PM  Result Value Ref Range Status   Specimen Description URINE, CATHETERIZED  Final   Special Requests NONE  Final   Culture NO GROWTH Performed at [facility]   Final   Report Status 02/01/2016  FINAL  Final     Discharge Instructions:   Discharge Instructions    Diet - low sodium heart healthy    Complete by:  As directed      Discharge instructions    Complete by:  As directed   Home health with 24/7 supervision by family Bowel regimen to avoid constipation     Increase activity slowly    Complete by:  As directed             Medication List    STOP taking these medications        aspirin 325 MG tablet  Replaced by:  aspirin 81 MG chewable tablet     metoprolol succinate 50 MG 24 hr tablet  Commonly known as:  TOPROL-XL      TAKE these medications        alendronate 70 MG tablet  Commonly known as:  FOSAMAX  Take 70 mg by mouth every 7 (seven) days. Take with a full glass of water on an empty stomach. - on Fridays     apixaban 2.5 MG Tabs tablet  Commonly known as:  ELIQUIS  Take 1 tablet (2.5 mg total) by mouth 2 (two) times daily.     aspirin 81 MG chewable tablet  Chew 1 tablet (81 mg total) by mouth daily.     atorvastatin 10 MG tablet  Commonly known as:  LIPITOR  Take 10 mg by  mouth daily.     cholestyramine 4 g packet  Commonly known as:  QUESTRAN  Take 4 g by mouth daily. Mix one packet in liquid and drink     citalopram 20 MG tablet  Commonly known as:  CELEXA  Take 1 tablet (20 mg total) by mouth daily.     ferrous sulfate 325 (65 FE) MG tablet  Take 325 mg by mouth daily with breakfast.     meclizine 25 MG tablet  Commonly known as:  ANTIVERT  Take 25 mg by mouth 3 (three) times daily as needed. dizziness     mirtazapine 15 MG tablet  Commonly known as:  REMERON  Take 15 mg by mouth at bedtime.     OLANZapine 2.5 MG tablet  Commonly known as:  ZYPREXA  Take 1 tablet (2.5 mg total) by mouth at bedtime.     ondansetron 4 MG disintegrating tablet  Commonly known as:  ZOFRAN-ODT  Take 4 mg by mouth every 6 (six) hours as needed. Nausea and vomiting     pioglitazone 15 MG tablet  Commonly known as:  ACTOS  Take 15 mg by mouth daily.     Vitamin B-12 2500 MCG Subl  Place 2,500 mcg under the tongue daily.           Follow-up Information    Follow up with Melvenia Beam, MD. Go on 02/06/2016.   Specialty:  Neurology   Why:  neurology follow up as scheduled.   Contact information:   [place] [address] [phone]        Time coordinating discharge: 35 min  Signed:  Jabari Swoveland U Johnni Wunschel   [facility] 02/02/2016, 1:54 PM

## 2016-02-02 NOTE — Progress Notes (Addendum)
Pt discharged home with family (husband and son), by car, assessment stable, prescriptions given, discharge instructions reviewe, all questions answered. 2 IVs removed. Telemetry discontinued. Pt taken by wheelchair to exit. Time of discharge: 1510  rn gave eliquis card to son.   pts daughter takes care of medications. Reviewed medications and discharge orders with son, but also called daughter and left a message for her about the changes in medications.

## 2016-02-02 NOTE — Discharge Instructions (Signed)
Ischemic Stroke Treated With Warfarin An ischemic stroke is the sudden death of brain tissue. Blood carries oxygen to all areas of your body. This type of stroke happens when your blood does not flow to your brain like normal. Your brain cannot get the oxygen it needs. This is an emergency. Symptoms of any stroke usually happen all of a sudden. You may notice them when you wake up. They can include sudden:  Weakness or loss of feeling in your face, arm, or leg, especially on one side of your body.  Confusion.  Trouble talking or understanding.  Trouble seeing.  Trouble walking or moving your arms or legs.  Dizziness.  Loss of balance or coordination.  Severe headache with no known cause. Get help as soon as any of these problems start. This is important so your doctor can treat you right away with:  Medicines that dissolve blood clots.  A device to remove a blood clot. A few hours after the start of your stroke symptoms, your doctor may treat you with:  Oxygen.  Medicines.  A surgery to widen your blood vessels. RISK FACTORS  Risk factors are things that make it more likely for you to have a stroke. These include:  High blood pressure.  High cholesterol.  Diabetes.  Heart disease.  Having a buildup of fatty deposits in the blood vessels.  Having an abnormal heart rhythm (atrial fibrillation).  Being very overweight (obese).  Smoking.  Taking birth control pills, especially if you smoke.  Not being active.  Having a diet that is high in fats, salt, and calories.  Drinking too much alcohol.  Using illegal drugs.  Being African American.  Being over the age of 34.  Having a family history of stroke.  Having a history of blood clots, stroke, warning stroke (transient ischemic attack, TIA), or heart attack.  Sickle cell disease. HOME CARE  Take all medicines exactly as told by your doctor. Understand all your medicine instructions.  You may need to  take a medicine like aspirin or warfarin to thin your blood. Take warfarin exactly as told by your doctor.  Taking too much or too little warfarin is dangerous. Get blood tests as often as told by your doctor, including the PT and INR tests. The test results help your doctor adjust your dose of warfarin.  Food can cause problems with warfarin. Some foods can affect the results of your blood tests. This is true for foods that are high in vitamin K. These foods include spinach, kale, broccoli, cabbage, collard greens, turnip greens, Brussels sprouts, peas, cauliflower, seaweed, parsley, beef liver, pork liver, green tea, and soybean oil. Eat the same amount of food high in vitamin K. Avoid major changes in your diet. Tell your doctor before changing your diet. Talk to a food specialist (dietitian) if you have questions.  Many medicines can cause problems with warfarin. Some can affect your PT and INR test results. Tell your doctor about all medicines you take. This includes vitamins and dietary pills (supplements). Be careful with aspirin and medicines that relieve redness, soreness, and puffiness (inflammation). Do not take or stop medicines unless your doctor tells you to.  Warfarin can cause a lot of bruising or bleeding. Hold pressure over cuts for longer than normal. Ask your doctor about other side effects of warfarin.  Avoid sports or activities that may cause injury or bleeding.  Be careful when you shave, floss your teeth, or use sharp objects.  Limit or stop  alcohol use. Tell your doctor if you change how much alcohol you drink.  Tell your dentist and other doctors that you take warfarin.  If you are able to swallow, eat healthy foods. Eat 5 or more servings of fruits and vegetables each day. Eat soft foods or pureed foods, or eat small bites of food so you do not choke.  Stay active. Try to get at least 30 minutes of activity on most or all days.  Do not smoke.  Keep your home safe  so you do not fall. Try:  Putting grab bars in the bedroom and bathroom.  Raising toilet seats.  Putting a seat in the shower.  Go to physical, occupational, and speech therapy sessions as told by your doctor.  Use a walker or cane all the time if told to do so.  Keep all doctor visits as told. GET HELP RIGHT AWAY IF:   You have sudden weakness or loss of feeling in your face, arm, or leg, especially on one side of your body.  You are suddenly confused.  You have trouble talking or understanding.  You have sudden trouble seeing.  You have sudden trouble walking or moving your arms or legs.  You have sudden dizziness.  You lose your balance, or your movements are not smooth.  You have a sudden, severe headache with no known cause.  You are partly unaware or totally unaware of what is going on around you. Any of these symptoms may be a sign of an emergency. Do not wait to see if the symptoms go away. Call for help (911 in [place] Do not drive yourself to the hospital.   This information is not intended to replace advice given to you by your health care provider. Make sure you discuss any questions you have with your health care provider.   Document Released: 08/21/2011 Document Revised: 09/22/2014 Document Reviewed: 02/20/2014 Elsevier Interactive Patient Education 2016 [employer]. Stroke Prevention Some health problems and behaviors may make it more likely for you to have a stroke. Below are ways to lessen your risk of having a stroke.   Be active for at least 30 minutes on most or all days.  Do not smoke. Try not to be around others who smoke.  Do not drink too much alcohol.  Do not have more than 2 drinks a day if you are a man.  Do not have more than 1 drink a day if you are a woman and are not pregnant.  Eat healthy foods, such as fruits and vegetables. If you were put on a specific diet, follow the diet as told.  Keep your cholesterol levels under control  through diet and medicines. Look for foods that are low in saturated fat, trans fat, cholesterol, and are high in fiber.  If you have diabetes, follow all diet plans and take your medicine as told.  Ask your doctor if you need treatment to lower your blood pressure. If you have high blood pressure (hypertension), follow all diet plans and take your medicine as told by your doctor.  If you are 2-64 years old, have your blood pressure checked every 3-5 years. If you are age 51 or older, have your blood pressure checked every year.  Keep a healthy weight. Eat foods that are low in calories, salt, saturated fat, trans fat, and cholesterol.  Do not take drugs.  Avoid birth control pills, if this applies. Talk to your doctor about the risks of taking birth control  pills.  Talk to your doctor if you have sleep problems (sleep apnea).  Take all medicine as told by your doctor.  You may be told to take aspirin or blood thinner medicine. Take this medicine as told by your doctor.  Understand your medicine instructions.  Make sure any other conditions you have are being taken care of. GET HELP RIGHT AWAY IF:  You suddenly lose feeling (you feel numb) or have weakness in your face, arm, or leg.  Your face or eyelid hangs down to one side.  You suddenly feel confused.  You have trouble talking (aphasia) or understanding what people are saying.  You suddenly have trouble seeing in one or both eyes.  You suddenly have trouble walking.  You are dizzy.  You lose your balance or your movements are clumsy (uncoordinated).  You suddenly have a very bad headache and you do not know the cause.  You have new chest pain.  Your heart feels like it is fluttering or skipping a beat (irregular heartbeat). Do not wait to see if the symptoms above go away. Get help right away. Call your local emergency services (911 in [place] Do not drive yourself to the hospital.   This information is not  intended to replace advice given to you by your health care provider. Make sure you discuss any questions you have with your health care provider.   Document Released: 03/02/2012 Document Revised: 09/22/2014 Document Reviewed: 03/04/2013 Elsevier Interactive Patient Education [employer].

## 2016-02-04 ENCOUNTER — Encounter (HOSPITAL_COMMUNITY): Payer: Self-pay | Admitting: Cardiology

## 2016-02-05 DIAGNOSIS — E119 Type 2 diabetes mellitus without complications: Secondary | ICD-10-CM | POA: Diagnosis not present

## 2016-02-05 DIAGNOSIS — I214 Non-ST elevation (NSTEMI) myocardial infarction: Secondary | ICD-10-CM | POA: Diagnosis not present

## 2016-02-05 DIAGNOSIS — I4891 Unspecified atrial fibrillation: Secondary | ICD-10-CM | POA: Diagnosis not present

## 2016-02-05 DIAGNOSIS — I509 Heart failure, unspecified: Secondary | ICD-10-CM | POA: Diagnosis not present

## 2016-02-05 DIAGNOSIS — C189 Malignant neoplasm of colon, unspecified: Secondary | ICD-10-CM | POA: Diagnosis not present

## 2016-02-05 DIAGNOSIS — Z8673 Personal history of transient ischemic attack (TIA), and cerebral infarction without residual deficits: Secondary | ICD-10-CM | POA: Diagnosis not present

## 2016-02-06 ENCOUNTER — Ambulatory Visit (INDEPENDENT_AMBULATORY_CARE_PROVIDER_SITE_OTHER): Payer: Medicare Other | Admitting: Neurology

## 2016-02-06 ENCOUNTER — Encounter: Payer: Self-pay | Admitting: Neurology

## 2016-02-06 VITALS — BP 130/82 | HR 40 | Resp 20 | Ht 62.0 in | Wt 143.0 lb

## 2016-02-06 DIAGNOSIS — E538 Deficiency of other specified B group vitamins: Secondary | ICD-10-CM | POA: Diagnosis not present

## 2016-02-06 DIAGNOSIS — R0683 Snoring: Secondary | ICD-10-CM

## 2016-02-06 DIAGNOSIS — E559 Vitamin D deficiency, unspecified: Secondary | ICD-10-CM

## 2016-02-06 DIAGNOSIS — I6340 Cerebral infarction due to embolism of unspecified cerebral artery: Secondary | ICD-10-CM | POA: Diagnosis not present

## 2016-02-06 DIAGNOSIS — R400 Somnolence: Secondary | ICD-10-CM

## 2016-02-06 DIAGNOSIS — F0150 Vascular dementia without behavioral disturbance: Secondary | ICD-10-CM | POA: Diagnosis not present

## 2016-02-06 DIAGNOSIS — G4710 Hypersomnia, unspecified: Secondary | ICD-10-CM | POA: Diagnosis not present

## 2016-02-06 DIAGNOSIS — I639 Cerebral infarction, unspecified: Secondary | ICD-10-CM | POA: Diagnosis not present

## 2016-02-06 DIAGNOSIS — R5383 Other fatigue: Secondary | ICD-10-CM

## 2016-02-06 MED ORDER — DONEPEZIL HCL 10 MG PO TABS: 10.0000 mg | ORAL_TABLET | Freq: Every day | ORAL | Status: DC

## 2016-02-06 MED ORDER — APIXABAN 2.5 MG PO TABS: 2.5000 mg | ORAL_TABLET | Freq: Two times a day (BID) | ORAL | Status: DC

## 2016-02-06 NOTE — Patient Instructions (Addendum)
Remember to drink plenty of fluid, eat healthy meals and do not skip any meals. Try to eat protein with a every meal and eat a healthy snack such as fruit or nuts in between meals. Try to keep a regular sleep-wake schedule and try to exercise daily, particularly in the form of walking, 20-30 minutes a day, if you can.   As far as your medications are concerned, I would like to suggest: Aricept: start with 1/2 tablet then increase to a whole tablet. Eliquis twice daily for stroke prevention Labs  As far as diagnostic testing: Sleep study  I would like to see you back in 3 months , sooner if we need to. Please call us with any interim questions, concerns, problems, updates or refill requests.    Our phone number is [phone]. We also have an after hours call service for urgent matters and there is a physician on-call for urgent questions. For any emergencies you know to call 911 or go to the nearest emergency room

## 2016-02-06 NOTE — Progress Notes (Signed)
[facility]    Provider:  Dr Jaynee Eagles Referring Provider: Glenda Chroman, MD Primary Care Physician:  Glenda Chroman, MD  CC: Depression vs Dementia  HPI: Laura Parks is a 82 y.o. female here as a follow up for depression and/or dementia and recent strokes. She snores "like a train". She sleeps all day long. She goes to bed by 10pm. She wakes up at 9am. She sleeps all day.   Interval history 02/06/2016: Neuropsychiatric testing 2 years ago showed that most of her symptoms were at that time likely due to pseudodementia secondary to severe depression.Since then she has had strokes and memory decline.  She had a recent admission 02/01/2016 for stroke. She has a questionable history of atrial fibrillation discovered 1 month ago, per history from her daughter. She was seen by cardiologist and it is unclear if she actually had a documented A. fib or not, per cardiologist. Clinically, patient did not have any focal neurological symptoms, no new speech problems or focal sensory or motor symptoms. She does have poor visual activity at baseline and narrow visual field from macular degeneration which has been grossly unchanged. She was started on heparin drip at [facility] prior to [facility] this month. MRI of the brain showed Multiple bilateral anterior and posterior circulation infarcts, embolic secondary to cardiac source, highly suspicious for afib although has not confirmed yet, Multiple previous subacute infarcts, Extensive white matter disease in both hemispheres, Multiple remote lacunar infarcts are also noted, stable. She was started on eliquis.  She takes b12 every day (unclear). She is due for home PT, OT and a nurse and they will be coming out to the house 3x a week. Since the strokes in April her memory and confusion have increased. Her MMSe today is 9/27. She forgets the way to her bathroom in the house. She can't cook anymore. She gets confused doing  anything. Her ability to do things has declined, she is now using a walker and wheelchair. She lives with husband and son and daughter is frequently there. Daughter provides al information today.   Interval History 08/31/2014: She is not eating. It is like she is committing suicide per daughter. Daughter is crying. Says mother is committing suicide slowly. Patient is anemic. Patient is dehydrated. She lost 18 pounds in 3 weeks. She will not eat. Her sodium levels are low. She feels hopeless. She is taking antidepressant every day. No interest in anything. She always enjoyed reading and now she can't do that. Patient's husband is worried. Daughter provides most of the information. Reviewed MRI images that showed generalized atrophy congruent with age otherwise unremarkable. B12 194.   Review of Systems: Patient complains of symptoms per HPI as well as the following symptoms:Appetite change, weight loss, fatigue, loss of vision, easy bruising, cough, murmur, swelling in legs, feeling cold, hearing loss, trouble swallowing, chronic diarrhea, memory loss, confusion, weakness, sleepiness, slurred speech, difficulty swallowing, dizziness, depression, anxiety, too much sleep, decreased energy, change in appetite, disinterest in activity, hallucinations, racing thoughts. Pertinent negatives per HPI. All others negative.  Inital visit 07/25/2014: Laura Parks is a 82 y.o. female here as a referral from Dr. Woody Seller for cognitive and behavioral changes. Daughter provides most of the information. 3 years ago noticed changes in her mother, she was losing her glasses and losing her purse and articles of clothing. It continued and progressed. Patient doesn't want to socially interact, doesn't want to go to church on Sundays and  used to love to go dancing and won't go there either. She has stopped wanting to do anything. Daughter and son pay bills. She doesn't cook anymore, last night she tried and burned the food. She  doesn't cook because she leaves burners on. This past summer daughter noticed worsening changes. In October patient became silent and distant, "in her own world". Then on October 19th patient asked daughter to come home and said it was going to be all right, patient thought the police were going to arrest her for brother's death and she is going to prison. Patient has been perseverating on going to jail since then and dieing in prison. Patient reports she did have something to do with her brother's death but doesn't know what she did. She says "I have no joy, no love" in my life. Patient agrees she is having memory problems, forgetting to turn the stove off. She is forgetting to feed the dog recently. Husband is also noticing the changes in memory and doesn't know what to do about it.   She feels sad most days, she has loss of interest in almost everything she used to do. She used to go shopping and doesn't want to do that anymore. She endorses decreased energy, fatigue, irregular sleep, decreased appetite and decreased intake, "I simply do not want anymore to eat". Restless all day.   Daughter also reports her mother is slowing down. Balance is poor, no falls. But has lost her balance.   Mother with alzheimers.   Reviewed notes, labs and imaging from outside physicians, which showed: cbc 11/2013 with anemia. cmp unremarkable. hgba1c 5.6. ldl 63. tsh wnl. Carotid doppler study 02/2014 with evidence of 40-59% bilateral ICA stenosis. Echocardiogram done at [facility] in October 2014 reported normal LV wall thickness with LVEF 60-65%, mild left atrial enlargement, severe MAC with mildly thickened mitral valve and mild mitral regurgitation, moderately sclerotic aortic valve, mild tricuspid regurgitation. The patient has history of colon carcinoma. A review of notes in [facility] through 07/2012 (mainly cardiology and gastro) don't mention any apparent cognitive dysfunction. Reviewed primary care notes  provided from [facility] internal, last OP note mentions that patient and granddaughter requesting a neuro referral for recent delusions about going to prison, changes in walking, leaving stove on, inability to do serial 7s and draw clock   Social History   Social History  . Marital Status: Married    Spouse Name: N/A  . Number of Children: N/A  . Years of Education: N/A   Occupational History  . Not on file.   Social History Main Topics  . Smoking status: Never Smoker   . Smokeless tobacco: Never Used  . Alcohol Use: No  . Drug Use: No  . Sexual Activity: Not on file   Other Topics Concern  . Not on file   Social History Narrative    Family History  Problem Relation Age of Onset  . COPD Mother   . Alzheimer's disease Mother   . Congestive Heart Failure Mother   . Diabetes Brother     Past Medical History  Diagnosis Date  . Type 2 diabetes mellitus ([place])   . Colon cancer ([place])   . Anemia   . Essential hypertension, benign   . Mixed hyperlipidemia   . Macular degeneration   . History of pneumonia   . Iron deficiency anemia     Erosive antral gastritis  . Cognitive impairment   . Coronary artery disease 01/04/2016    "Angiographically  minimal coronary disease. No culprit lesion to explain significant elevation in troponin"  . CHF (congestive heart failure) ([place])   . Stroke ([place]) 01/2016    watershed infract  . Legal blindness     Past Surgical History  Procedure Laterality Date  . Cataract extraction    . Cholecystectomy    . Appendectomy    . Right knee surgery    . Colon surgery for colon cancer      1992 by Dr Mendel Ryder  . Tubal ligation    . Colonoscopy  03/24/2012    Procedure: COLONOSCOPY;  Surgeon: Rogene Houston, MD;  Location: [facility];  Service: Endoscopy;  Laterality: N/A;  1200  . Esophagogastroduodenoscopy  08/04/2012    Procedure: ESOPHAGOGASTRODUODENOSCOPY (EGD);  Surgeon: Rogene Houston, MD;  Location: [facility];  Service: Endoscopy;   Laterality: N/A;  325  . Cardiac catheterization N/A 01/04/2016    Procedure: Left Heart Cath and Coronary Angiography;  Surgeon: Leonie Man, MD;  Location: [place] [facility];  Service: Cardiovascular;  Laterality: N/A;  . Tee without cardioversion N/A 02/01/2016    Procedure: TRANSESOPHAGEAL ECHOCARDIOGRAM (TEE);  Surgeon: Jerline Pain, MD;  Location: [facility] ENDOSCOPY;  Service: Cardiovascular;  Laterality: N/A;    Current Outpatient Prescriptions  Medication Sig Dispense Refill  . alendronate (FOSAMAX) 70 MG tablet Take 70 mg by mouth every 7 (seven) days. Take with a full glass of water on an empty stomach. - on Fridays    . apixaban (ELIQUIS) 2.5 MG TABS tablet Take 1 tablet (2.5 mg total) by mouth 2 (two) times daily. 60 tablet 0  . aspirin 81 MG chewable tablet Chew 1 tablet (81 mg total) by mouth daily. 30 tablet 0  . atorvastatin (LIPITOR) 10 MG tablet Take 10 mg by mouth daily.    . cholestyramine (QUESTRAN) 4 G packet Take 4 g by mouth daily. Mix one packet in liquid and drink    . citalopram (CELEXA) 20 MG tablet Take 1 tablet (20 mg total) by mouth daily. 30 tablet 0  . Cyanocobalamin (VITAMIN B-12) 2500 MCG SUBL Place 2,500 mcg under the tongue daily.     . ferrous sulfate 325 (65 FE) MG tablet Take 325 mg by mouth daily with breakfast.    . meclizine (ANTIVERT) 25 MG tablet Take 25 mg by mouth 3 (three) times daily as needed. dizziness    . mirtazapine (REMERON) 15 MG tablet Take 15 mg by mouth at bedtime.    . ondansetron (ZOFRAN-ODT) 4 MG disintegrating tablet Take 4 mg by mouth every 6 (six) hours as needed. Nausea and vomiting    . pioglitazone (ACTOS) 15 MG tablet Take 15 mg by mouth daily.     No current facility-administered medications for this visit.    Allergies as of 02/06/2016 - Review Complete 02/06/2016  Allergen Reaction Noted  . Codeine Nausea Only 02/23/2012    Vitals: BP 130/82 mmHg  Pulse 40  Resp 20  Ht 5\' 2"  (1.575 m)  Wt 143 lb (64.864 kg)  BMI  26.15 kg/m2 Last Weight:  Wt Readings from Last 1 Encounters:  02/06/16 143 lb (64.864 kg)   Last Height:   Ht Readings from Last 1 Encounters:  02/06/16 5\' 2"  (1.575 m)       Neuro: Detailed Neurologic Exam:  Speech:  Speech is normal; fluent and spontaneous with normal comprehension.    Cognition:    Montreal Cognitive Assessment  08/31/2014  Visuospatial/ Executive (0/5) 1  Naming (0/3) 3  Attention: Read list of digits (0/2) 2  Attention: Read list of letters (0/1) 1  Attention: Serial 7 subtraction starting at 100 (0/3) 3  Language: Repeat phrase (0/2) 2  Language : Fluency (0/1) 0  Abstraction (0/2) 0  Delayed Recall (0/5) 0  Orientation (0/6) 4  Total 16  Adjusted Score (based on education) 17   MMSE - Mini Mental State Exam 02/06/2016  Not completed: (No Data)  Orientation to time 2  Orientation to Place 1  Registration 1  Attention/ Calculation 0  Recall 0  Language- name 2 objects 2  Language- repeat 0  Language- follow 3 step command 3  Language- read & follow direction 0  Write a sentence 0  Copy design 0  Total score 9    Cranial Nerves:   The pupils are equal, round, and reactive to light. Deceased vision (legally blind due to macular degeneration). The fundi are flat. Visual fields are full to finger waving. Extraocular movements are intact. Trigeminal sensation is intact and the muscles of mastication are normal. The face is symmetric. The palate elevates in the midline. Voice is normal. Shoulder shrug is normal. The tongue has normal motion without fasciculations.   Coordination:  Normal finger to nose. Difficulty heel to shin due to body habitus. Slowed rapid alternating movements bilat.  Gait:  Mildly wide-based but small stride, decreased arm swing bilat, en-bloc turning, using a walker  Motor Observation:   Masked facies. No tremor. Tone:  Increased tone with facilitation in the upper extremities  Posture:  Posture  is normal.    Strength:  Strength is intact in the upper and lower limbs.    Sensation:   Intact light touch Reflex Exam:  DTR's:  Absent achilles otherwise deep tendon reflexes in the upper and lower extremities are normal bilaterally.  Toes:  Right down. Left mute. Clonus:  Clonus is absent.  Other: No frontal release signs.       Assessment/Plan: Ms. SARANDA KIZEWSKI is a 82 y.o. female with history of recent strokes/elevated troponins who was placed on ASA and cath without significant CAD, ? PAF, dementia and diabetes presented to [facility] "acting strange and confused". MRI with new embolic infarcts. Transferred to [facility] for further evaluation. She did not receive IV t-PA.   Stroke: Multiple bilateral anterior and posterior circulation infarcts, embolic secondary to cardiac source, highly suspicious for afib although has not confirmed yet.   MRI Multiple punctate B anterior and posterior circulation infarcts in setting of subacute multiple B embolic infarcts.   MRA 12/2015 moderate stenosis of right cavernous ICA  Carotid Doppler 4.2017 No significant stenosis  2D Echo 12/2015 EF 60-65% with no source of embolus.  LE dopplers 12/2015 negative   LDL 72  HgbA1c 6.9  IContinue eliquis for stroke prevention  Patient counseled to be compliant with her antithrombotic medications  Ongoing aggressive stroke risk factor management  Monitor HTN, follow with pcp  Continue statin  HgbA1c 6.9, at goal < 7.0  Cognitive impairment, vascular type, MMSE today 9. Start Eliquis  Snoring, sleeping all day: Needs sleep evaluation for OSA  VB 12 deficiency: check B12 today  Legally Blind  NSTEMI, Takotsubo cardiomyopathy  Baseline gait abnormality  CKD stage [place], MD  [facility] [address] [place], [place] [address]  Phone [phone] Fax [phone]  A total of 45 minutes was spent  face-to-face with this patient. Over half this time was spent  on counseling patient on the embolic stroke, dementia, OSA diagnosis and different diagnostic and therapeutic options available.

## 2016-02-07 ENCOUNTER — Telehealth: Payer: Self-pay | Admitting: *Deleted

## 2016-02-07 DIAGNOSIS — I214 Non-ST elevation (NSTEMI) myocardial infarction: Secondary | ICD-10-CM | POA: Diagnosis not present

## 2016-02-07 DIAGNOSIS — E119 Type 2 diabetes mellitus without complications: Secondary | ICD-10-CM | POA: Diagnosis not present

## 2016-02-07 DIAGNOSIS — I4891 Unspecified atrial fibrillation: Secondary | ICD-10-CM | POA: Diagnosis not present

## 2016-02-07 DIAGNOSIS — Z8673 Personal history of transient ischemic attack (TIA), and cerebral infarction without residual deficits: Secondary | ICD-10-CM | POA: Diagnosis not present

## 2016-02-07 DIAGNOSIS — I509 Heart failure, unspecified: Secondary | ICD-10-CM | POA: Diagnosis not present

## 2016-02-07 DIAGNOSIS — I639 Cerebral infarction, unspecified: Secondary | ICD-10-CM | POA: Insufficient documentation

## 2016-02-07 DIAGNOSIS — C189 Malignant neoplasm of colon, unspecified: Secondary | ICD-10-CM | POA: Diagnosis not present

## 2016-02-07 NOTE — Telephone Encounter (Signed)
Per Dr Jaynee Eagles, spoke with patient and informed her that her labs look good. She verbalized understanding, appreciation.

## 2016-02-08 DIAGNOSIS — E1129 Type 2 diabetes mellitus with other diabetic kidney complication: Secondary | ICD-10-CM | POA: Diagnosis not present

## 2016-02-08 DIAGNOSIS — N184 Chronic kidney disease, stage 4 (severe): Secondary | ICD-10-CM | POA: Diagnosis not present

## 2016-02-08 DIAGNOSIS — I482 Chronic atrial fibrillation: Secondary | ICD-10-CM | POA: Diagnosis not present

## 2016-02-08 DIAGNOSIS — Z299 Encounter for prophylactic measures, unspecified: Secondary | ICD-10-CM | POA: Diagnosis not present

## 2016-02-08 LAB — THYROID PANEL WITH TSH
FREE THYROXINE INDEX: 1.9 (ref 1.2–4.9)
T3 Uptake Ratio: 27 % (ref 24–39)
T4 TOTAL: 6.9 ug/dL (ref 4.5–12.0)
TSH: 4.14 u[IU]/mL (ref 0.450–4.500)

## 2016-02-08 LAB — METHYLMALONIC ACID, SERUM: METHYLMALONIC ACID: 320 nmol/L (ref 0–378)

## 2016-02-08 LAB — VITAMIN D PNL(25-HYDRXY+1,25-DIHY)-BLD
Vit D, 1,25-Dihydroxy: 32.7 pg/mL (ref 19.9–79.3)
Vit D, 25-Hydroxy: 37.7 ng/mL (ref 30.0–100.0)

## 2016-02-08 LAB — B12 AND FOLATE PANEL
Folate: 7.8 ng/mL (ref >3.0)
Vitamin B-12: >2000 pg/mL — ABNORMAL HIGH (ref 211–946)

## 2016-02-11 DIAGNOSIS — I4891 Unspecified atrial fibrillation: Secondary | ICD-10-CM | POA: Diagnosis not present

## 2016-02-11 DIAGNOSIS — Z8673 Personal history of transient ischemic attack (TIA), and cerebral infarction without residual deficits: Secondary | ICD-10-CM | POA: Diagnosis not present

## 2016-02-11 DIAGNOSIS — I214 Non-ST elevation (NSTEMI) myocardial infarction: Secondary | ICD-10-CM | POA: Diagnosis not present

## 2016-02-11 DIAGNOSIS — E119 Type 2 diabetes mellitus without complications: Secondary | ICD-10-CM | POA: Diagnosis not present

## 2016-02-11 DIAGNOSIS — C189 Malignant neoplasm of colon, unspecified: Secondary | ICD-10-CM | POA: Diagnosis not present

## 2016-02-11 DIAGNOSIS — I509 Heart failure, unspecified: Secondary | ICD-10-CM | POA: Diagnosis not present

## 2016-02-12 DIAGNOSIS — I4891 Unspecified atrial fibrillation: Secondary | ICD-10-CM | POA: Diagnosis not present

## 2016-02-12 DIAGNOSIS — I509 Heart failure, unspecified: Secondary | ICD-10-CM | POA: Diagnosis not present

## 2016-02-12 DIAGNOSIS — I214 Non-ST elevation (NSTEMI) myocardial infarction: Secondary | ICD-10-CM | POA: Diagnosis not present

## 2016-02-12 DIAGNOSIS — C189 Malignant neoplasm of colon, unspecified: Secondary | ICD-10-CM | POA: Diagnosis not present

## 2016-02-12 DIAGNOSIS — E119 Type 2 diabetes mellitus without complications: Secondary | ICD-10-CM | POA: Diagnosis not present

## 2016-02-12 DIAGNOSIS — Z8673 Personal history of transient ischemic attack (TIA), and cerebral infarction without residual deficits: Secondary | ICD-10-CM | POA: Diagnosis not present

## 2016-02-13 DIAGNOSIS — I214 Non-ST elevation (NSTEMI) myocardial infarction: Secondary | ICD-10-CM | POA: Diagnosis not present

## 2016-02-13 DIAGNOSIS — I4891 Unspecified atrial fibrillation: Secondary | ICD-10-CM | POA: Diagnosis not present

## 2016-02-13 DIAGNOSIS — E119 Type 2 diabetes mellitus without complications: Secondary | ICD-10-CM | POA: Diagnosis not present

## 2016-02-13 DIAGNOSIS — Z8673 Personal history of transient ischemic attack (TIA), and cerebral infarction without residual deficits: Secondary | ICD-10-CM | POA: Diagnosis not present

## 2016-02-13 DIAGNOSIS — I509 Heart failure, unspecified: Secondary | ICD-10-CM | POA: Diagnosis not present

## 2016-02-13 DIAGNOSIS — C189 Malignant neoplasm of colon, unspecified: Secondary | ICD-10-CM | POA: Diagnosis not present

## 2016-02-14 DIAGNOSIS — I509 Heart failure, unspecified: Secondary | ICD-10-CM | POA: Diagnosis not present

## 2016-02-14 DIAGNOSIS — I4891 Unspecified atrial fibrillation: Secondary | ICD-10-CM | POA: Diagnosis not present

## 2016-02-14 DIAGNOSIS — I214 Non-ST elevation (NSTEMI) myocardial infarction: Secondary | ICD-10-CM | POA: Diagnosis not present

## 2016-02-14 DIAGNOSIS — C189 Malignant neoplasm of colon, unspecified: Secondary | ICD-10-CM | POA: Diagnosis not present

## 2016-02-14 DIAGNOSIS — E119 Type 2 diabetes mellitus without complications: Secondary | ICD-10-CM | POA: Diagnosis not present

## 2016-02-14 DIAGNOSIS — Z8673 Personal history of transient ischemic attack (TIA), and cerebral infarction without residual deficits: Secondary | ICD-10-CM | POA: Diagnosis not present

## 2016-02-15 NOTE — Discharge Summary (Signed)
Physician Discharge Summary  DAIA HEBNER MRN: [id number] DOB/AGE: Dec 06, 1929 82 y.o.  PCP: Glenda Chroman, MD   Admit date: 12/26/2015 Discharge date: 02/15/2016  Discharge Diagnoses:     Principal Problem:   Stroke [facility]) Active Problems:   Essential hypertension, benign   Type 2 diabetes mellitus (HCC)   Hyperkalemia   CHF (congestive heart failure) (HCC)   Acute encephalopathy   TIA (transient ischemic attack)   AKI (acute kidney injury) ([place])   Dehydration   Legal blindness   Palliative care encounter   Acute renal failure ([place])   Altered mental status   NSTEMI (non-ST elevated myocardial infarction) ([place])   PAF (paroxysmal atrial fibrillation) ([place])   Acute delirium   Vascular dementia   Cerebral infarction (hemorrhagic or thromboembolic)    Follow-up recommendations Follow-up with PCP in 3-5 days , including all  additional recommended appointments as below Follow-up CBC, CMP in 3-5 days Patient is being discharged with home health PT [facility] Daughter [phone]  [phone]        Discharge Medication List as of 01/06/2016 12:41 PM    START taking these medications   Details  aspirin 325 MG tablet Take 1 tablet (325 mg total) by mouth daily., Starting 01/05/2016, Until Discontinued, Normal    OLANZapine (ZYPREXA) 2.5 MG tablet Take 1 tablet (2.5 mg total) by mouth at bedtime., Starting 01/06/2016, Until Discontinued, Normal      CONTINUE these medications which have NOT CHANGED   Details  alendronate (FOSAMAX) 70 MG tablet Take 70 mg by mouth every 7 (seven) days. Take with a full glass of water on an empty stomach. - on Fridays, Until Discontinued, Historical Med    atorvastatin (LIPITOR) 10 MG tablet Take 10 mg by mouth daily., Until Discontinued, Historical Med    cholestyramine (QUESTRAN) 4 G packet Take 4 g by mouth daily. Mix one packet in liquid and drink, Until Discontinued, Historical Med    Cyanocobalamin (VITAMIN B-12) 2500 MCG SUBL  Place 2,500 mcg under the tongue daily. , Until Discontinued, Historical Med    ferrous sulfate 325 (65 FE) MG tablet Take 325 mg by mouth daily with breakfast., Until Discontinued, Historical Med    citalopram (CELEXA) 40 MG tablet Take 1 tablet (40 mg total) by mouth daily., Starting 08/31/2014, Until Discontinued, Normal    pioglitazone (ACTOS) 30 MG tablet Take 15 mg by mouth daily. , Until Discontinued, Historical Med    ergocalciferol (VITAMIN D2) 50000 UNITS capsule Take 50,000 Units by mouth once a week. On Sundays, Until Discontinued, Historical Med      STOP taking these medications     lisinopril (PRINIVIL,ZESTRIL) 40 MG tablet      metoprolol succinate (TOPROL-XL) 50 MG 24 hr tablet      mirtazapine (REMERON) 15 MG tablet          Discharge Condition: *Stable  Discharge Instructions Get Medicines reviewed and adjusted: Please take all your medications with you for your next visit with your Primary MD  Please request your Primary MD to go over all hospital tests and procedure/radiological results at the follow up, please ask your Primary MD to get all Hospital records sent to his/her office.  If you experience worsening of your admission symptoms, develop shortness of breath, life threatening emergency, suicidal or homicidal thoughts you must seek medical attention immediately by calling 911 or calling your MD immediately if symptoms less severe.  You must read complete instructions/literature along with all the possible adverse reactions/side effects  for all the Medicines you take and that have been prescribed to you. Take any new Medicines after you have completely understood and accpet all the possible adverse reactions/side effects.   Do not drive when taking Pain medications.   Do not take more than prescribed Pain, Sleep and Anxiety Medications  Special Instructions: If you have smoked or chewed Tobacco in the last 2 yrs please stop smoking, stop any regular  Alcohol and or any Recreational drug use.  Wear Seat belts while driving.  Please note  You were cared for by a hospitalist during your hospital stay. Once you are discharged, your primary care physician will handle any further medical issues. Please note that NO REFILLS for any discharge medications will be authorized once you are discharged, as it is imperative that you return to your primary care physician (or establish a relationship with a primary care physician if you do not have one) for your aftercare needs so that they can reassess your need for medications and monitor your lab values.  Discharge Instructions    Ambulatory referral to Neurology    Complete by:  As directed   Follow up with Dr. Jaynee Eagles at [facility] in about one month. Pt is Dr. Cathren Laine pt. Thanks.     Diet - low sodium heart healthy    Complete by:  As directed      Increase activity slowly    Complete by:  As directed             Allergies  Allergen Reactions  . Codeine Nausea Only      Disposition: SNF    Consults:  Cardiology Neurology     Significant Diagnostic Studies:  Dg Chest 2 View  01/30/2016  CLINICAL DATA:  Confusion EXAM: CHEST  2 VIEW COMPARISON:  January 06, 2016 FINDINGS: There is no edema or consolidation. Heart is mildly enlarged with pulmonary vascularity within normal limits. There is calcification in the mitral annulus. No adenopathy. Bones are osteoporotic. There is degenerative change in the thoracic spine. IMPRESSION: Persistent mild cardiac enlargement. No edema or consolidation. Bones osteoporotic. Electronically Signed   By: Lowella Grip III M.D.   On: 01/30/2016 16:25   Ct Head Wo Contrast  01/30/2016  CLINICAL DATA:  Confusion with gait disturbance EXAM: CT HEAD WITHOUT CONTRAST TECHNIQUE: Contiguous axial images were obtained from the base of the skull through the vertex without intravenous contrast. COMPARISON:  December 31, 2015 FINDINGS: Mild to moderate diffuse atrophy is  stable. There is no intracranial mass, hemorrhage, extra-axial fluid collection, or midline shift. Patchy small vessel disease in the centra semiovale bilaterally is stable. There is evidence of a prior small infarct in the posterior aspect of the posterior limb of the left internal capsule, stable. There is no new gray-white compartment lesion. No acute infarct evident. The bony calvarium appears intact. The mastoid air cells are clear. Visualized orbits appear symmetric bilaterally. IMPRESSION: Atrophy with periventricular small vessel disease. Prior small infarct in the posterior aspect of the posterior limb of the internal capsule on the left, stable. No acute infarct evident. No hemorrhage or mass effect. Electronically Signed   By: Lowella Grip III M.D.   On: 01/30/2016 16:27   Mr Brain Wo Contrast (neuro Protocol)  01/30/2016  CLINICAL DATA:  Confusion and unsteady gait. Personal history of colon cancer. EXAM: MRI HEAD WITHOUT CONTRAST TECHNIQUE: Multiplanar, multiecho pulse sequences of the brain and surrounding structures were obtained without intravenous contrast. COMPARISON:  MRI brain and MRA  head 12/27/2015. CT head without contrast 01/30/2016. FINDINGS: In addition to the multiple punctate previously-seen bilateral cerebral and cerebellar infarcts, several new punctate foci of restricted diffusion in acute infarction are noted. Most prominent areas are in the left basal ganglia and right caudate head. There is a punctate focus the anterior right frontal lobe and medial right parietal lobe. New bilateral punctate lesions are present in the occipital poles and the posterior right insular cortex. There are new foci of acute nonhemorrhagic infarction involving the medial posterior temporal lobes and bilateral cerebellum, left greater than right. These white matter changes are associated with these lesions in addition to multiple other more remote ischemic lesions bilaterally. Moderate generalized  atrophy is again noted. The ventricles are proportionate to the degree of atrophy. The internal auditory canals are within normal limits bilaterally. Flow is present in the major intracranial arteries. Bilateral lens replacements are present. Polyps or mucous retention cysts are noted along the inferior aspect of the right maxillary sinus without change. The paranasal sinuses are otherwise clear. There is some fluid in the mastoid air cells bilaterally. No obstructing nasopharyngeal lesion is evident. IMPRESSION: 1. Multiple additional acute nonhemorrhagic punctate acute infarcts throughout both cerebral hemispheres and cerebellum. Watershed infarcts are previously suggested. These may again be watershed infarcts. Multiple embolic foci from a cardiac arrhythmia is also considered. 2. Multiple previous subacute infarcts are again seen. 3. Extensive white matter disease is present in both hemispheres. 4. Multiple remote lacunar infarcts are also noted, stable. 5. Right maxillary sinus disease is stable. Electronically Signed   By: San Morelle M.D.   On: 01/30/2016 18:30      2-D echo LV EF: 60% - 65%  ------------------------------------------------------------------- Indications: CVA 436.  ------------------------------------------------------------------- History: PMH: Elevated troponins. Hypertension. Hyperlipidemia. Diabetes. Blind. Colon cancer.  ------------------------------------------------------------------- Study Conclusions  - Left ventricle: The cavity size was normal. Wall thickness was  increased in a pattern of mild LVH. Systolic function was normal.  The estimated ejection fraction was in the range of 60% to 65%.  Wall motion was normal; there were no regional wall motion  abnormalities. Doppler parameters are consistent with abnormal  left ventricular relaxation (grade 1 diastolic dysfunction). - Aortic valve: Severely calcified annulus. Moderately  thickened,  moderately calcified leaflets. There was mild stenosis. Valve  area (VTI): 1.86 cm^2. Valve area (Vmax): 1.75 cm^2. Valve area  (Vmean): 1.56 cm^2. - Mitral valve: Moderately calcified annulus. Moderately thickened,  moderately calcified leaflets . The findings are consistent with  mild stenosis. There was mild regurgitation. Valve area by  continuity equation (using LVOT flow): 1.37 cm^2. - Left atrium: The atrium was mildly dilated. - Right atrium: The atrium was moderately dilated.   Cardiac catheterization 1. Prox RCA lesion, 20% stenosed. 2. Ost Ramus to Ramus lesion, 40% stenosed. 3. Elevated LVEDP, 27 mmHg   Filed Weights   01/04/16 0405 01/05/16 0500 01/06/16 0602  Weight: 67.6 kg (149 lb 0.5 oz) 67.223 kg (148 lb 3.2 oz) 66.906 kg (147 lb 8 oz)     Microbiology: No results found for this or any previous visit (from the past 240 hour(s)).     Blood Culture    Component Value Date/Time   SDES URINE, CATHETERIZED 01/30/2016 1830   SPECREQUEST NONE 01/30/2016 1830   CULT NO GROWTH Performed at [facility]  01/30/2016 1830   REPTSTATUS 02/01/2016 FINAL 01/30/2016 1830      Labs: No results found for this or any previous visit (from the past 48 hour(s)).  Lipid Panel     Component Value Date/Time   CHOL 130 01/31/2016 0423   TRIG 46 01/31/2016 0423   HDL 49 01/31/2016 0423   CHOLHDL 2.7 01/31/2016 0423   VLDL 9 01/31/2016 0423   LDLCALC 72 01/31/2016 0423     Lab Results  Component Value Date   HGBA1C 6.9* 01/31/2016   HGBA1C 6.6* 12/27/2015   HGBA1C 6.5* 12/27/2015     Lab Results  Component Value Date   LDLCALC 72 01/31/2016   CREATININE 1.30* 02/02/2016     82 y.o. female with a history of type 2 diabetes mellitus, colon cancer, hypertension, hyperlipidemia, macular degeneration with blindness and an equivocal history of cognitive impairment, brought to the emergency room for evaluation of confusion and slurred  speech that was noted several hours prior to this admission. She was unable to provide any history at the time of the admission and while in ED she has vomited multiple times. CT scan of her head showed no acute intracranial abnormality. She has remained lethargic throughout the ED evaluation. Code stroke was subsequently cancelled, as acute aphasia was not clear and patient had no clear focal motor deficits.    Assessment & Plan:  Bilateral, infra and supra-tentorial infarcts, c/w cardioembolic infarcts secondary to either atrial fibrillation vs acute MI - Resultant L facial, L arm and leg weakness, improving overall  - MRIAcute bilateral infra- and supratentorial small infarcts - Carotid Doppler and ECHO with normal EF [id number], normal systolic function, grade I diastolic CHF Started on IV Heparin , consulted cardiology, status postcardiac cath - PT eval done, recommends HH PT, OT eval done, recommends SNF, which patient declined - SLP also done, pt passed eval and regular thin diet recommended  - spoke with Dr. Leonie Man, stroke specialist, no further recommendations from their standpoint  Status post left heart cath That showed nonobstructive disease , continue aspirin 325 mg There are no plans for TEE or loop recorder, as she is not thought to be a good candidate for long-term anticoagulation.  Acute coronary syndrome vs atrial fibrillation, ? NSTEMI, demand ischemia  - Troponin 0.05 on arrival, up to 7.77 --> 19 --> 32 --> 35 --> 39 --> 27 --> 23 --> 12 --> 6 - cardiology following, Continue aspirin, atorvastatin, and heparin. Cardiology discontinued heparin after cath   Acute encephalopathy, likely toxic/medication-induced - seems to be related to Ativan she gets if she is agitated  - zyprexa seems to be helping and will be continued    Leukocytosis - no signs of PNA on imaging studies and no UTI - discontinued Rocephin 4/17, pt has received total of three days of rocephin  - WBC  remains stable and WNL   Hypertension, essential  - reasonably stable this AM  Diabetes, type II, on oral antihyperglycemics - on Actos at home - here on SSI, diet advanced to carb modified and pt tolerating well   Acute kidney injury with metabolic acidosis , 0000000 - from acute illness, pre renal etiology, demand ischemia - renal US with no acute abnormalities noted  - Cr nicely trending down,    Transaminitis - from acute illness - resolved   Hyperkalemia - resolved   Thrombocytopenia - reactive  - resolved   Anemia of critical illness - no indication of transfusion at this time  - no signs of bleeding - CBC in AM    Discharge Exam:    Blood pressure 176/69, pulse 90, temperature 98.1 F (36.7 C), temperature source Oral, resp.  rate 21, height 5\' 5"  (1.651 m), weight 66.906 kg (147 lb 8 oz), SpO2 99 %.      Follow-up Information    Follow up with Melvenia Beam, MD. Schedule an appointment as soon as possible for a visit in 1 month.   Specialty:  Neurology   Contact information:   [address] [place] [address] [phone]       Follow up with Glenda Chroman, MD. Schedule an appointment as soon as possible for a visit in 3 days.   Specialty:  Internal Medicine   Contact information:   [place] [address] [phone]       Follow up with [place].   Why:  home health physical therapy, occupational therapy, nurse and home health aide   Contact information:   [address] [place] [address] [phone]       Signed: Reyne Dumas 02/15/2016, 12:14 PM        Time spent >45 mins

## 2016-02-19 DIAGNOSIS — Z8673 Personal history of transient ischemic attack (TIA), and cerebral infarction without residual deficits: Secondary | ICD-10-CM | POA: Diagnosis not present

## 2016-02-19 DIAGNOSIS — E119 Type 2 diabetes mellitus without complications: Secondary | ICD-10-CM | POA: Diagnosis not present

## 2016-02-19 DIAGNOSIS — C189 Malignant neoplasm of colon, unspecified: Secondary | ICD-10-CM | POA: Diagnosis not present

## 2016-02-19 DIAGNOSIS — I214 Non-ST elevation (NSTEMI) myocardial infarction: Secondary | ICD-10-CM | POA: Diagnosis not present

## 2016-02-19 DIAGNOSIS — I4891 Unspecified atrial fibrillation: Secondary | ICD-10-CM | POA: Diagnosis not present

## 2016-02-19 DIAGNOSIS — I509 Heart failure, unspecified: Secondary | ICD-10-CM | POA: Diagnosis not present

## 2016-02-20 DIAGNOSIS — I4891 Unspecified atrial fibrillation: Secondary | ICD-10-CM | POA: Diagnosis not present

## 2016-02-20 DIAGNOSIS — C189 Malignant neoplasm of colon, unspecified: Secondary | ICD-10-CM | POA: Diagnosis not present

## 2016-02-20 DIAGNOSIS — I214 Non-ST elevation (NSTEMI) myocardial infarction: Secondary | ICD-10-CM | POA: Diagnosis not present

## 2016-02-20 DIAGNOSIS — Z8673 Personal history of transient ischemic attack (TIA), and cerebral infarction without residual deficits: Secondary | ICD-10-CM | POA: Diagnosis not present

## 2016-02-20 DIAGNOSIS — I509 Heart failure, unspecified: Secondary | ICD-10-CM | POA: Diagnosis not present

## 2016-02-20 DIAGNOSIS — E119 Type 2 diabetes mellitus without complications: Secondary | ICD-10-CM | POA: Diagnosis not present

## 2016-02-22 DIAGNOSIS — I4891 Unspecified atrial fibrillation: Secondary | ICD-10-CM | POA: Diagnosis not present

## 2016-02-22 DIAGNOSIS — E119 Type 2 diabetes mellitus without complications: Secondary | ICD-10-CM | POA: Diagnosis not present

## 2016-02-22 DIAGNOSIS — I509 Heart failure, unspecified: Secondary | ICD-10-CM | POA: Diagnosis not present

## 2016-02-22 DIAGNOSIS — C189 Malignant neoplasm of colon, unspecified: Secondary | ICD-10-CM | POA: Diagnosis not present

## 2016-02-22 DIAGNOSIS — Z8673 Personal history of transient ischemic attack (TIA), and cerebral infarction without residual deficits: Secondary | ICD-10-CM | POA: Diagnosis not present

## 2016-02-22 DIAGNOSIS — I214 Non-ST elevation (NSTEMI) myocardial infarction: Secondary | ICD-10-CM | POA: Diagnosis not present

## 2016-02-25 ENCOUNTER — Institutional Professional Consult (permissible substitution): Payer: Medicare Other | Admitting: Neurology

## 2016-02-25 DIAGNOSIS — I214 Non-ST elevation (NSTEMI) myocardial infarction: Secondary | ICD-10-CM | POA: Diagnosis not present

## 2016-02-25 DIAGNOSIS — I509 Heart failure, unspecified: Secondary | ICD-10-CM | POA: Diagnosis not present

## 2016-02-25 DIAGNOSIS — E119 Type 2 diabetes mellitus without complications: Secondary | ICD-10-CM | POA: Diagnosis not present

## 2016-02-25 DIAGNOSIS — Z8673 Personal history of transient ischemic attack (TIA), and cerebral infarction without residual deficits: Secondary | ICD-10-CM | POA: Diagnosis not present

## 2016-02-25 DIAGNOSIS — C189 Malignant neoplasm of colon, unspecified: Secondary | ICD-10-CM | POA: Diagnosis not present

## 2016-02-25 DIAGNOSIS — I4891 Unspecified atrial fibrillation: Secondary | ICD-10-CM | POA: Diagnosis not present

## 2016-02-26 DIAGNOSIS — Z8673 Personal history of transient ischemic attack (TIA), and cerebral infarction without residual deficits: Secondary | ICD-10-CM | POA: Diagnosis not present

## 2016-02-26 DIAGNOSIS — I4891 Unspecified atrial fibrillation: Secondary | ICD-10-CM | POA: Diagnosis not present

## 2016-02-26 DIAGNOSIS — C189 Malignant neoplasm of colon, unspecified: Secondary | ICD-10-CM | POA: Diagnosis not present

## 2016-02-26 DIAGNOSIS — I214 Non-ST elevation (NSTEMI) myocardial infarction: Secondary | ICD-10-CM | POA: Diagnosis not present

## 2016-02-26 DIAGNOSIS — E119 Type 2 diabetes mellitus without complications: Secondary | ICD-10-CM | POA: Diagnosis not present

## 2016-02-26 DIAGNOSIS — I509 Heart failure, unspecified: Secondary | ICD-10-CM | POA: Diagnosis not present

## 2016-02-27 ENCOUNTER — Encounter: Payer: Self-pay | Admitting: Cardiology

## 2016-02-27 ENCOUNTER — Ambulatory Visit (INDEPENDENT_AMBULATORY_CARE_PROVIDER_SITE_OTHER): Payer: Medicare Other | Admitting: Cardiology

## 2016-02-27 VITALS — BP 138/68 | HR 42 | Ht 62.0 in | Wt 141.0 lb

## 2016-02-27 DIAGNOSIS — I480 Paroxysmal atrial fibrillation: Secondary | ICD-10-CM

## 2016-02-27 DIAGNOSIS — Z5181 Encounter for therapeutic drug level monitoring: Secondary | ICD-10-CM | POA: Diagnosis not present

## 2016-02-27 DIAGNOSIS — E785 Hyperlipidemia, unspecified: Secondary | ICD-10-CM

## 2016-02-27 DIAGNOSIS — I639 Cerebral infarction, unspecified: Secondary | ICD-10-CM | POA: Diagnosis not present

## 2016-02-27 DIAGNOSIS — I10 Essential (primary) hypertension: Secondary | ICD-10-CM

## 2016-02-27 DIAGNOSIS — I214 Non-ST elevation (NSTEMI) myocardial infarction: Secondary | ICD-10-CM

## 2016-02-27 DIAGNOSIS — Z8673 Personal history of transient ischemic attack (TIA), and cerebral infarction without residual deficits: Secondary | ICD-10-CM | POA: Diagnosis not present

## 2016-02-27 MED ORDER — METOPROLOL SUCCINATE ER 25 MG PO TB24: 25.0000 mg | ORAL_TABLET | Freq: Every day | ORAL | Status: DC

## 2016-02-27 NOTE — Progress Notes (Signed)
Cardiology Office Note  Date: 02/27/2016   ID: Laura Parks, DOB February 29, 1930, MRN [id number]  PCP: Glenda Chroman, MD  Primary Cardiologist: Rozann Lesches, MD   Chief Complaint  Patient presents with  . History of stroke  . Suspected PAF    History of Present Illness: Laura Parks is an 82 y.o. female that I have not seen in the office since November 2015. Substantial change in history since I last saw her. I reviewed her records and updated the chart. She was admitted to [facility] in April with acute mental status changes and elevated troponin I (peak 38). She was managed by the Neurology and Cardiology services. Brain MRI showed multiple regions of watershed infarct felt to be potentially related to cardiac arrhythmia or hypotension. Echocardiogram showed LVEF 60-65%. She underwent a cardiac catheterization after showing clinical improvement in mental status and this procedure revealed only mild coronary atherosclerosis with a 20% RCA and 40% ramus, no culprit to explain definitive ACS or require revascularization. Medical therapy was recommended. She was readmitted to the hospital in May with recurrent stroke symptoms and recurrent infarct by brain MRI. Although no definitive atrial fibrillation was documented, she was placed on Eliquis given high suspicion of embolic event. TEE did not demonstrate any intracardiac thrombus or PFO.  She is here today with her daughter. Overall seems to be doing fairly well. She has completed PT and OT, has gone from using a walker to ambulating freely. She does not report any unusual fatigue, dizziness, or syncope. Heart rate is in the low 40s today. She is on Toprol-XL 50 mg daily, also recently placed on Aricept by her neurologist within the last month. She has evidence of conduction disease on review of her ECG.  She does not report any angina symptoms. I discussed the results of her cardiac specific testing from recent hospitalizations. So far she is  tolerating Eliquis without any obvious bleeding problems. I reviewed her recent ECG.  Past Medical History  Diagnosis Date  . Type 2 diabetes mellitus ([place])   . Colon cancer ([place])   . Anemia   . Essential hypertension, benign   . Mixed hyperlipidemia   . Macular degeneration   . History of pneumonia   . Iron deficiency anemia     Erosive antral gastritis  . Cognitive impairment   . Coronary artery disease     Minimal coronary atherosclerosis at cardiac catheterization April 2017  . Stroke [facility]) 01/2016    Watershed infract  . Legal blindness     Past Surgical History  Procedure Laterality Date  . Cataract extraction    . Cholecystectomy    . Appendectomy    . Right knee surgery    . Colon surgery for colon cancer      1992 by Dr Mendel Ryder  . Tubal ligation    . Colonoscopy  03/24/2012    Procedure: COLONOSCOPY;  Surgeon: Rogene Houston, MD;  Location: [facility] ENDO SUITE;  Service: Endoscopy;  Laterality: N/A;  1200  . Esophagogastroduodenoscopy  08/04/2012    Procedure: ESOPHAGOGASTRODUODENOSCOPY (EGD);  Surgeon: Rogene Houston, MD;  Location: [facility] ENDO SUITE;  Service: Endoscopy;  Laterality: N/A;  325  . Cardiac catheterization N/A 01/04/2016    Procedure: Left Heart Cath and Coronary Angiography;  Surgeon: Leonie Man, MD;  Location: [facility] CV LAB;  Service: Cardiovascular;  Laterality: N/A;  . Tee without cardioversion N/A 02/01/2016    Procedure: TRANSESOPHAGEAL ECHOCARDIOGRAM (TEE);  Surgeon:  Jerline Pain, MD;  Location: [facility] ENDOSCOPY;  Service: Cardiovascular;  Laterality: N/A;    Current Outpatient Prescriptions  Medication Sig Dispense Refill  . alendronate (FOSAMAX) 70 MG tablet Take 70 mg by mouth every 7 (seven) days. Take with a full glass of water on an empty stomach. - on Fridays    . apixaban (ELIQUIS) 2.5 MG TABS tablet Take 1 tablet (2.5 mg total) by mouth 2 (two) times daily. 180 tablet 11  . atorvastatin (LIPITOR) 10 MG tablet Take 10 mg by mouth daily.     . citalopram (CELEXA) 20 MG tablet Take 1 tablet (20 mg total) by mouth daily. 30 tablet 0  . Cyanocobalamin (VITAMIN B-12) 2500 MCG SUBL Place 2,500 mcg under the tongue daily.     . ferrous sulfate 325 (65 FE) MG tablet Take 325 mg by mouth daily with breakfast.    . meclizine (ANTIVERT) 25 MG tablet Take 25 mg by mouth 3 (three) times daily as needed. dizziness    . mirtazapine (REMERON) 15 MG tablet Take 15 mg by mouth at bedtime.    . ondansetron (ZOFRAN-ODT) 4 MG disintegrating tablet Take 4 mg by mouth every 6 (six) hours as needed. Nausea and vomiting    . pioglitazone (ACTOS) 15 MG tablet Take 15 mg by mouth daily.    . Sennosides (SENOKOT PO) Take by mouth as needed.    . donepezil (ARICEPT) 10 MG tablet Take 1 tablet by mouth daily.    . metoprolol succinate (TOPROL XL) 25 MG 24 hr tablet Take 1 tablet (25 mg total) by mouth daily. 45 tablet 3   No current facility-administered medications for this visit.   Allergies:  Codeine   Social History: The patient  reports that she has never smoked. She has never used smokeless tobacco. She reports that she does not drink alcohol or use illicit drugs.   ROS:  Please see the history of present illness. Otherwise, complete review of systems is positive for mild unsteadiness, no falls.  All other systems are reviewed and negative.   Physical Exam: VS:  BP 138/68 mmHg  Pulse 42  Ht 5\' 2"  (1.575 m)  Wt 141 lb (63.957 kg)  BMI 25.78 kg/m2  SpO2 97%, BMI Body mass index is 25.78 kg/(m^2).  Wt Readings from Last 3 Encounters:  02/27/16 141 lb (63.957 kg)  02/06/16 143 lb (64.864 kg)  01/30/16 146 lb 9.7 oz (66.5 kg)    Elderly woman, appears comfortable at rest. HEENT: Conjunctiva and lids normal, oropharynx clear. Neck: Supple, no elevated JVP, soft right carotid bruit, no thyromegaly. Lungs: Clear to auscultation, nonlabored breathing at rest. Cardiac: Regular rate and rhythm with ectopy, no S3, soft systolic murmur, no pericardial  rub. Abdomen: Soft, nontender, bowel sounds present, no guarding or rebound. Extremities: No pitting edema, distal pulses 2+. Skin: Warm and dry. Musculoskeletal: No kyphosis. Neuropsychiatric: Alert and oriented 3, affect appropriate.  ECG: I personally reviewed the tracing from 01/30/2016 which showed sinus bradycardia with PVCs, left anterior fascicular block, right bundle branch block.  Recent Labwork: 12/26/2015: B Natriuretic Peptide 252.5* 01/30/2016: ALT 12*; AST 21; Magnesium 1.8 02/02/2016: BUN 21*; Creatinine, Ser 1.30*; Hemoglobin 9.4*; Platelets 143*; Potassium 4.3; Sodium 141 02/06/2016: TSH 4.140     Component Value Date/Time   CHOL 130 01/31/2016 0423   TRIG 46 01/31/2016 0423   HDL 49 01/31/2016 0423   CHOLHDL 2.7 01/31/2016 0423   VLDL 9 01/31/2016 0423   LDLCALC 72 01/31/2016 0423  Other Studies Reviewed Today:  Cardiac catheterization 01/04/2016: 1. Prox RCA lesion, 20% stenosed. 2. Ost Ramus to Ramus lesion, 40% stenosed. 3. Elevated LVEDP, 27 mmHg  Angiographically minimal coronary disease. No culprit lesion to explain significant elevation in troponin.  Transesophageal echocardiogram 02/01/2016: Study Conclusions  - Left ventricle: Systolic function was normal. The estimated  ejection fraction was in the range of 60% to 65%. Wall motion was  normal; there were no regional wall motion abnormalities. - Aortic valve: Trileaflet; normal thickness, mildly calcified  leaflets. - Mitral valve: Mildly calcified annulus. There was mild  regurgitation. - Left atrium: No evidence of thrombus in the atrial cavity or  appendage. No evidence of thrombus in the atrial cavity or  appendage. - Right atrium: No evidence of thrombus in the atrial cavity or  appendage. - Atrial septum: No defect or patent foramen ovale was identified.  Echo contrast study showed no right-to-left atrial level shunt,  following an increase in RA pressure induced by  provocative  maneuvers.  Assessment and Plan:  1. History of NSTEMI with significant troponin I elevation, although no findings of obstructive CAD at heart catheterization. She has been managed medically. Currently on Toprol-XL and Lipitor. No active angina symptoms.  2. Significant bradycardia. Plan is to reduce Toprol-XL to 25 mg daily, can go down further if necessary. She is also on Aricept which can lead to bradycardia and this will need to be followed as well.  3. PAF with history of stroke, now on Eliquis. Recent lab work reviewed. She will have a follow-up CBC and BME T for next visit in 3 months.  4. Essential hypertension, no changes made to medications today other than that mentioned above.  5. Hyperlipidemia, on Lipitor. Recent LDL 72.  Current medicines were reviewed with the patient today.   Orders Placed This Encounter  Procedures  . Basic metabolic panel  . CBC    Disposition: FU with me in 3 months.   Signed, Satira Sark, MD, [facility] 02/27/2016 2:09 PM    [facility] [place], [place], [place] [address] Phone: [phone]; Fax: [phone]

## 2016-02-27 NOTE — Patient Instructions (Addendum)
Your physician has recommended you make the following change in your medication:  Decrease metoprolol succinate to 25 mg daily. You may break your 50 mg tablet in half daily until they are finished. Continue all other medications the same. Your physician recommends that you return for lab work in: 3 months just before your next visit in September 2017 to check your BMET and CBC. Please make arrangements to have this completed at Dr. Woody Seller office. Your physician recommends that you schedule a follow-up appointment in: 3 months.

## 2016-02-29 DIAGNOSIS — E1151 Type 2 diabetes mellitus with diabetic peripheral angiopathy without gangrene: Secondary | ICD-10-CM | POA: Diagnosis not present

## 2016-02-29 DIAGNOSIS — I214 Non-ST elevation (NSTEMI) myocardial infarction: Secondary | ICD-10-CM | POA: Diagnosis not present

## 2016-02-29 DIAGNOSIS — C189 Malignant neoplasm of colon, unspecified: Secondary | ICD-10-CM | POA: Diagnosis not present

## 2016-02-29 DIAGNOSIS — B351 Tinea unguium: Secondary | ICD-10-CM | POA: Diagnosis not present

## 2016-02-29 DIAGNOSIS — E119 Type 2 diabetes mellitus without complications: Secondary | ICD-10-CM | POA: Diagnosis not present

## 2016-02-29 DIAGNOSIS — I4891 Unspecified atrial fibrillation: Secondary | ICD-10-CM | POA: Diagnosis not present

## 2016-02-29 DIAGNOSIS — E1140 Type 2 diabetes mellitus with diabetic neuropathy, unspecified: Secondary | ICD-10-CM | POA: Diagnosis not present

## 2016-02-29 DIAGNOSIS — L110 Acquired keratosis follicularis: Secondary | ICD-10-CM | POA: Diagnosis not present

## 2016-02-29 DIAGNOSIS — Z8673 Personal history of transient ischemic attack (TIA), and cerebral infarction without residual deficits: Secondary | ICD-10-CM | POA: Diagnosis not present

## 2016-02-29 DIAGNOSIS — I509 Heart failure, unspecified: Secondary | ICD-10-CM | POA: Diagnosis not present

## 2016-03-04 DIAGNOSIS — F329 Major depressive disorder, single episode, unspecified: Secondary | ICD-10-CM | POA: Diagnosis not present

## 2016-03-04 DIAGNOSIS — I10 Essential (primary) hypertension: Secondary | ICD-10-CM | POA: Diagnosis not present

## 2016-03-04 DIAGNOSIS — Z8673 Personal history of transient ischemic attack (TIA), and cerebral infarction without residual deficits: Secondary | ICD-10-CM | POA: Diagnosis not present

## 2016-03-04 DIAGNOSIS — I4891 Unspecified atrial fibrillation: Secondary | ICD-10-CM | POA: Diagnosis not present

## 2016-03-04 DIAGNOSIS — I509 Heart failure, unspecified: Secondary | ICD-10-CM | POA: Diagnosis not present

## 2016-03-04 DIAGNOSIS — E119 Type 2 diabetes mellitus without complications: Secondary | ICD-10-CM | POA: Diagnosis not present

## 2016-03-04 DIAGNOSIS — E7800 Pure hypercholesterolemia, unspecified: Secondary | ICD-10-CM | POA: Diagnosis not present

## 2016-03-04 DIAGNOSIS — I214 Non-ST elevation (NSTEMI) myocardial infarction: Secondary | ICD-10-CM | POA: Diagnosis not present

## 2016-03-04 DIAGNOSIS — C189 Malignant neoplasm of colon, unspecified: Secondary | ICD-10-CM | POA: Diagnosis not present

## 2016-03-09 DIAGNOSIS — I4891 Unspecified atrial fibrillation: Secondary | ICD-10-CM | POA: Diagnosis not present

## 2016-03-09 DIAGNOSIS — E782 Mixed hyperlipidemia: Secondary | ICD-10-CM | POA: Diagnosis not present

## 2016-03-09 DIAGNOSIS — I110 Hypertensive heart disease with heart failure: Secondary | ICD-10-CM | POA: Diagnosis not present

## 2016-03-09 DIAGNOSIS — C189 Malignant neoplasm of colon, unspecified: Secondary | ICD-10-CM | POA: Diagnosis not present

## 2016-03-09 DIAGNOSIS — Z8673 Personal history of transient ischemic attack (TIA), and cerebral infarction without residual deficits: Secondary | ICD-10-CM | POA: Diagnosis not present

## 2016-03-09 DIAGNOSIS — H548 Legal blindness, as defined in USA: Secondary | ICD-10-CM | POA: Diagnosis not present

## 2016-03-09 DIAGNOSIS — I252 Old myocardial infarction: Secondary | ICD-10-CM | POA: Diagnosis not present

## 2016-03-09 DIAGNOSIS — D510 Vitamin B12 deficiency anemia due to intrinsic factor deficiency: Secondary | ICD-10-CM | POA: Diagnosis not present

## 2016-03-09 DIAGNOSIS — I509 Heart failure, unspecified: Secondary | ICD-10-CM | POA: Diagnosis not present

## 2016-03-09 DIAGNOSIS — E119 Type 2 diabetes mellitus without complications: Secondary | ICD-10-CM | POA: Diagnosis not present

## 2016-03-10 DIAGNOSIS — C189 Malignant neoplasm of colon, unspecified: Secondary | ICD-10-CM | POA: Diagnosis not present

## 2016-03-10 DIAGNOSIS — I4891 Unspecified atrial fibrillation: Secondary | ICD-10-CM | POA: Diagnosis not present

## 2016-03-10 DIAGNOSIS — I110 Hypertensive heart disease with heart failure: Secondary | ICD-10-CM | POA: Diagnosis not present

## 2016-03-10 DIAGNOSIS — I509 Heart failure, unspecified: Secondary | ICD-10-CM | POA: Diagnosis not present

## 2016-03-10 DIAGNOSIS — E119 Type 2 diabetes mellitus without complications: Secondary | ICD-10-CM | POA: Diagnosis not present

## 2016-03-10 DIAGNOSIS — Z8673 Personal history of transient ischemic attack (TIA), and cerebral infarction without residual deficits: Secondary | ICD-10-CM | POA: Diagnosis not present

## 2016-03-13 DIAGNOSIS — Z1231 Encounter for screening mammogram for malignant neoplasm of breast: Secondary | ICD-10-CM | POA: Diagnosis not present

## 2016-03-18 DIAGNOSIS — I110 Hypertensive heart disease with heart failure: Secondary | ICD-10-CM | POA: Diagnosis not present

## 2016-03-18 DIAGNOSIS — Z8673 Personal history of transient ischemic attack (TIA), and cerebral infarction without residual deficits: Secondary | ICD-10-CM | POA: Diagnosis not present

## 2016-03-18 DIAGNOSIS — I4891 Unspecified atrial fibrillation: Secondary | ICD-10-CM | POA: Diagnosis not present

## 2016-03-18 DIAGNOSIS — E119 Type 2 diabetes mellitus without complications: Secondary | ICD-10-CM | POA: Diagnosis not present

## 2016-03-18 DIAGNOSIS — I509 Heart failure, unspecified: Secondary | ICD-10-CM | POA: Diagnosis not present

## 2016-03-18 DIAGNOSIS — C189 Malignant neoplasm of colon, unspecified: Secondary | ICD-10-CM | POA: Diagnosis not present

## 2016-03-25 DIAGNOSIS — I4891 Unspecified atrial fibrillation: Secondary | ICD-10-CM | POA: Diagnosis not present

## 2016-03-25 DIAGNOSIS — I110 Hypertensive heart disease with heart failure: Secondary | ICD-10-CM | POA: Diagnosis not present

## 2016-03-25 DIAGNOSIS — I509 Heart failure, unspecified: Secondary | ICD-10-CM | POA: Diagnosis not present

## 2016-03-25 DIAGNOSIS — E119 Type 2 diabetes mellitus without complications: Secondary | ICD-10-CM | POA: Diagnosis not present

## 2016-03-25 DIAGNOSIS — C189 Malignant neoplasm of colon, unspecified: Secondary | ICD-10-CM | POA: Diagnosis not present

## 2016-03-25 DIAGNOSIS — Z8673 Personal history of transient ischemic attack (TIA), and cerebral infarction without residual deficits: Secondary | ICD-10-CM | POA: Diagnosis not present

## 2016-04-01 DIAGNOSIS — E119 Type 2 diabetes mellitus without complications: Secondary | ICD-10-CM | POA: Diagnosis not present

## 2016-04-01 DIAGNOSIS — C189 Malignant neoplasm of colon, unspecified: Secondary | ICD-10-CM | POA: Diagnosis not present

## 2016-04-01 DIAGNOSIS — Z8673 Personal history of transient ischemic attack (TIA), and cerebral infarction without residual deficits: Secondary | ICD-10-CM | POA: Diagnosis not present

## 2016-04-01 DIAGNOSIS — I509 Heart failure, unspecified: Secondary | ICD-10-CM | POA: Diagnosis not present

## 2016-04-01 DIAGNOSIS — I110 Hypertensive heart disease with heart failure: Secondary | ICD-10-CM | POA: Diagnosis not present

## 2016-04-01 DIAGNOSIS — I4891 Unspecified atrial fibrillation: Secondary | ICD-10-CM | POA: Diagnosis not present

## 2016-04-08 DIAGNOSIS — I110 Hypertensive heart disease with heart failure: Secondary | ICD-10-CM | POA: Diagnosis not present

## 2016-04-08 DIAGNOSIS — I4891 Unspecified atrial fibrillation: Secondary | ICD-10-CM | POA: Diagnosis not present

## 2016-04-08 DIAGNOSIS — C189 Malignant neoplasm of colon, unspecified: Secondary | ICD-10-CM | POA: Diagnosis not present

## 2016-04-08 DIAGNOSIS — E119 Type 2 diabetes mellitus without complications: Secondary | ICD-10-CM | POA: Diagnosis not present

## 2016-04-08 DIAGNOSIS — Z8673 Personal history of transient ischemic attack (TIA), and cerebral infarction without residual deficits: Secondary | ICD-10-CM | POA: Diagnosis not present

## 2016-04-08 DIAGNOSIS — I509 Heart failure, unspecified: Secondary | ICD-10-CM | POA: Diagnosis not present

## 2016-04-09 DIAGNOSIS — E7800 Pure hypercholesterolemia, unspecified: Secondary | ICD-10-CM | POA: Diagnosis not present

## 2016-04-09 DIAGNOSIS — I10 Essential (primary) hypertension: Secondary | ICD-10-CM | POA: Diagnosis not present

## 2016-04-09 DIAGNOSIS — E119 Type 2 diabetes mellitus without complications: Secondary | ICD-10-CM | POA: Diagnosis not present

## 2016-04-09 DIAGNOSIS — F329 Major depressive disorder, single episode, unspecified: Secondary | ICD-10-CM | POA: Diagnosis not present

## 2016-04-15 DIAGNOSIS — Z8673 Personal history of transient ischemic attack (TIA), and cerebral infarction without residual deficits: Secondary | ICD-10-CM | POA: Diagnosis not present

## 2016-04-15 DIAGNOSIS — I4891 Unspecified atrial fibrillation: Secondary | ICD-10-CM | POA: Diagnosis not present

## 2016-04-15 DIAGNOSIS — I110 Hypertensive heart disease with heart failure: Secondary | ICD-10-CM | POA: Diagnosis not present

## 2016-04-15 DIAGNOSIS — C189 Malignant neoplasm of colon, unspecified: Secondary | ICD-10-CM | POA: Diagnosis not present

## 2016-04-15 DIAGNOSIS — E119 Type 2 diabetes mellitus without complications: Secondary | ICD-10-CM | POA: Diagnosis not present

## 2016-04-15 DIAGNOSIS — I509 Heart failure, unspecified: Secondary | ICD-10-CM | POA: Diagnosis not present

## 2016-04-18 IMAGING — CT CT HEAD W/O CM
2 series · 16 of 30 positions shown, 20 images · non-contrast
Comparison: MRI 12/27/2015

CLINICAL DATA: Cerebral infarction

EXAM:
CT HEAD WITHOUT CONTRAST
TECHNIQUE: Contiguous axial images were obtained from the base of the skull
through the vertex without intravenous contrast.

[Series 201: head w/o, idose (1) · axial · non-contrast · 0.45mm/px · z∈[+104,+234]mm · 13 of 32 slices shown, 17 images]
[im 3/32  brain]
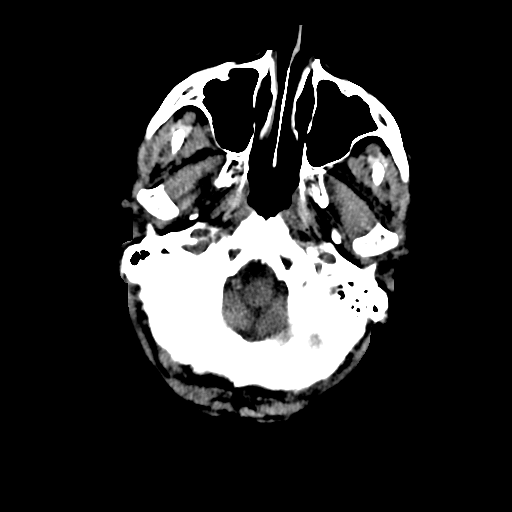
[im 3/32  bone]
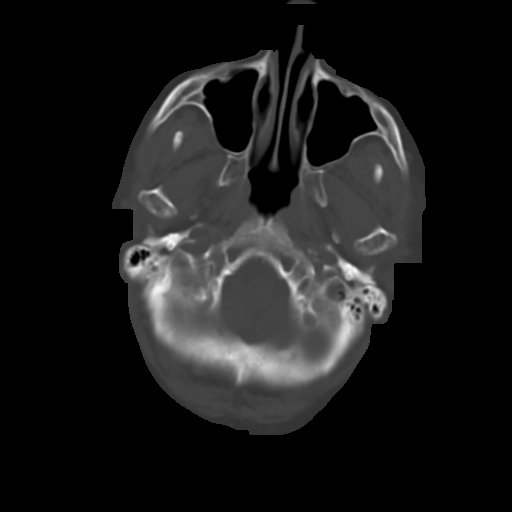
[im 5/32  brain]
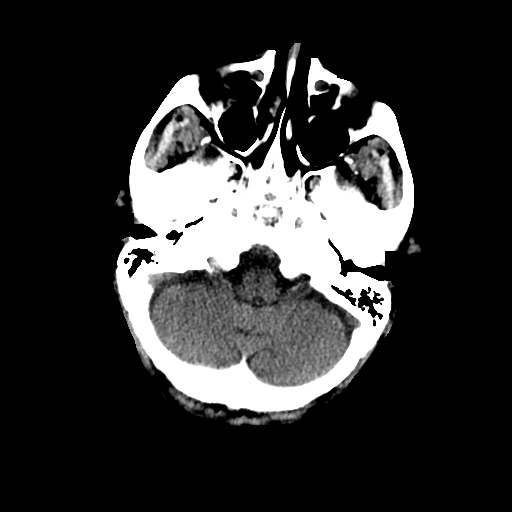
[im 7/32  brain]
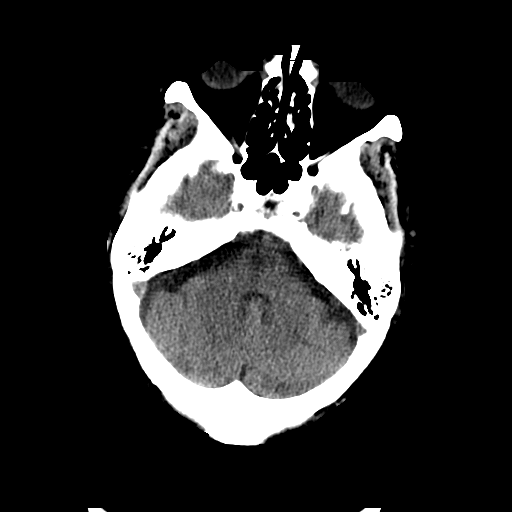
[im 9/32  brain]
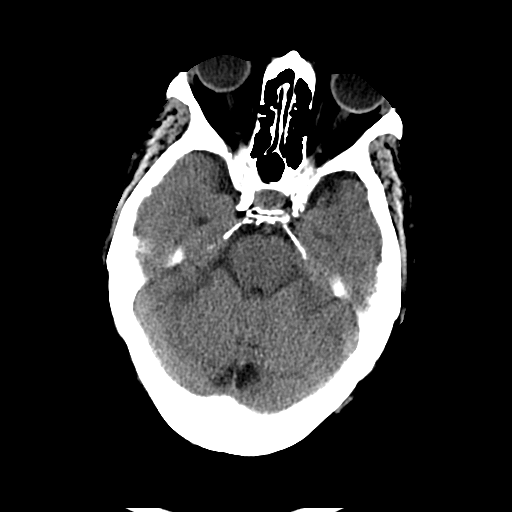
[im 12/32  brain]
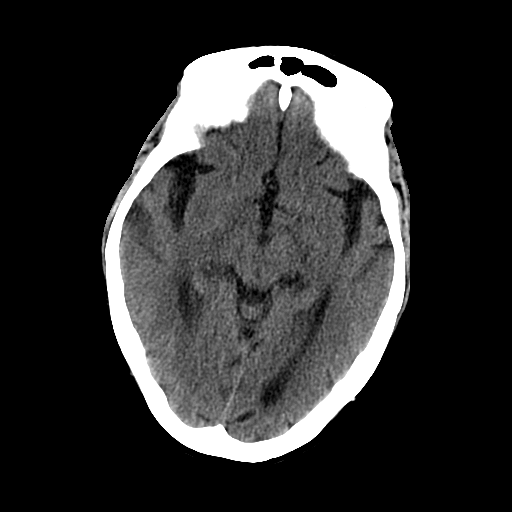
[im 12/32  bone]
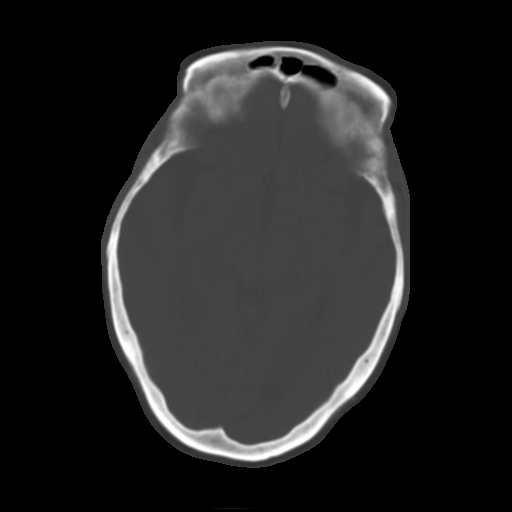
[im 14/32  brain]
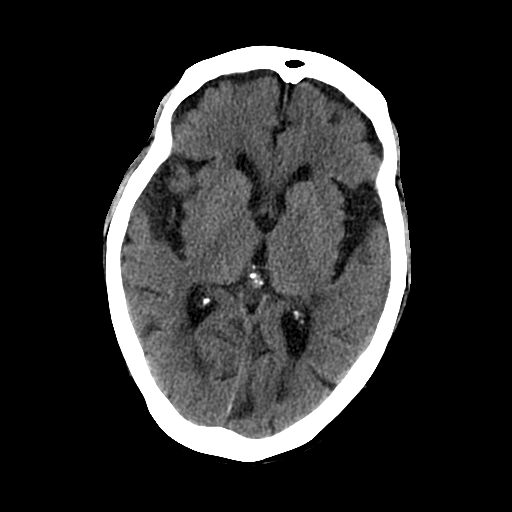
[im 16/32  brain]
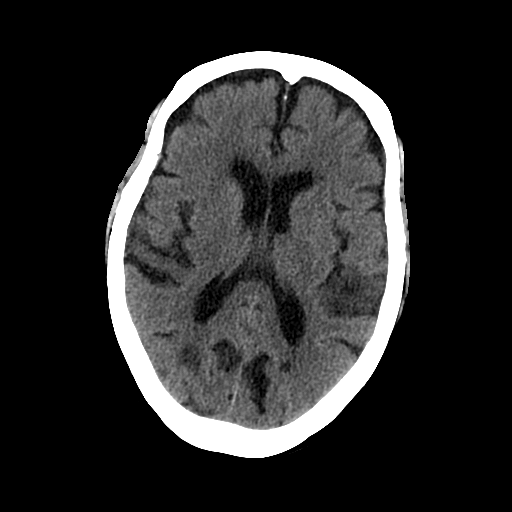
[im 18/32  brain]
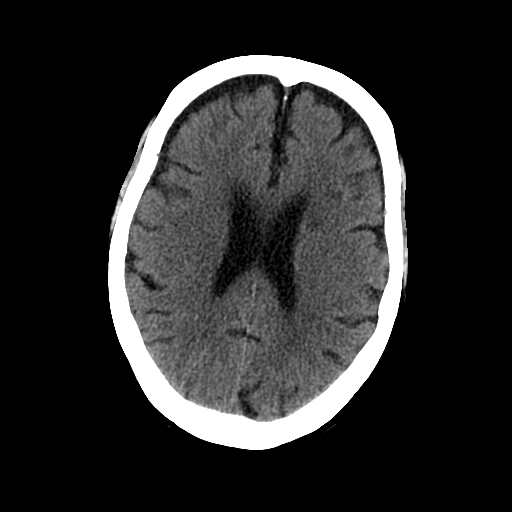
[im 20/32  brain]
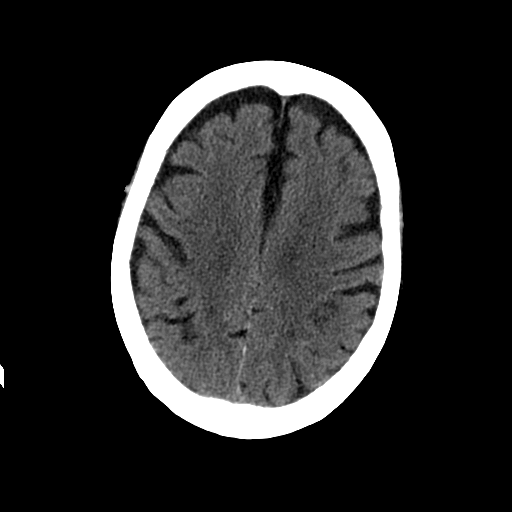
[im 20/32  bone]
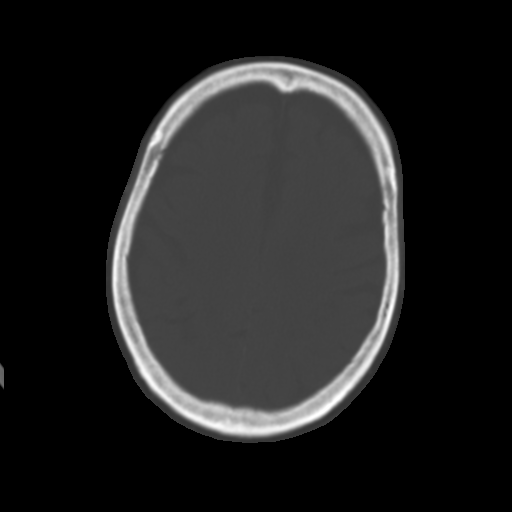
[im 23/32  brain]
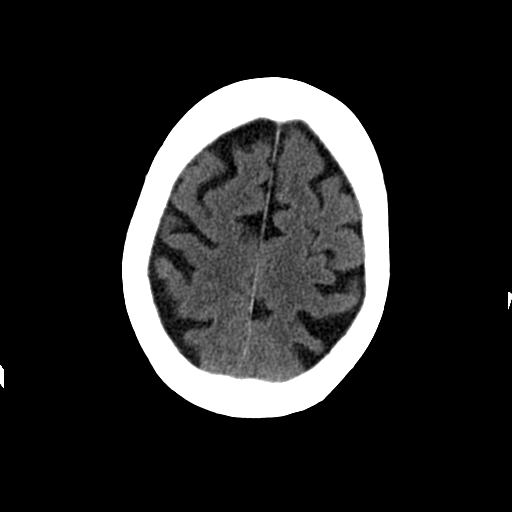
[im 25/32  brain]
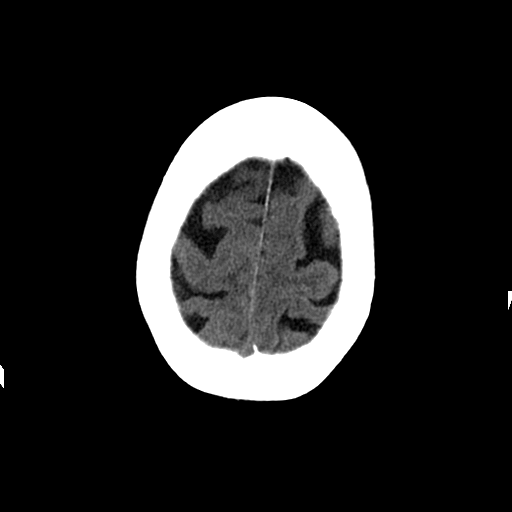
[im 27/32  brain]
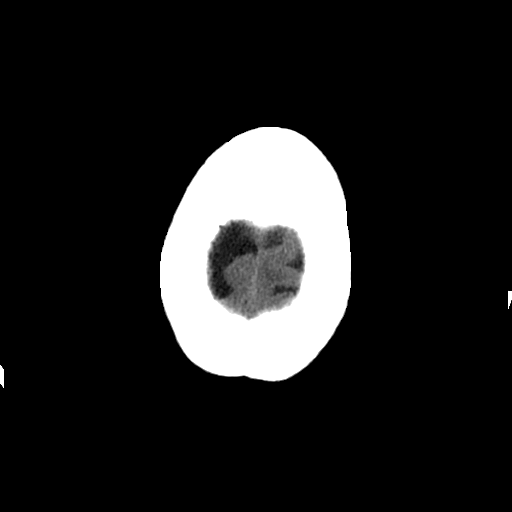
[im 29/32  brain]
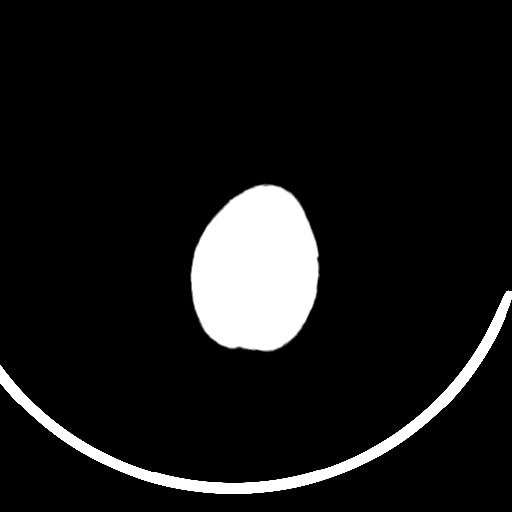
[im 29/32  bone]
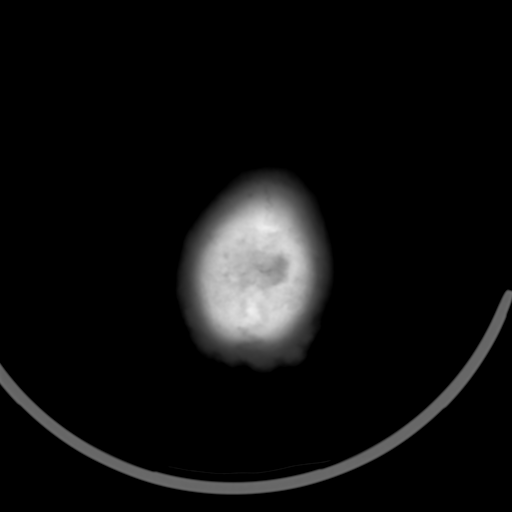

[Series 202: head w/o bone, idose (1) · axial · non-contrast · 0.45mm/px · z∈[+104,+149]mm · 3 of 32 slices shown]
[im 3/32  bone]
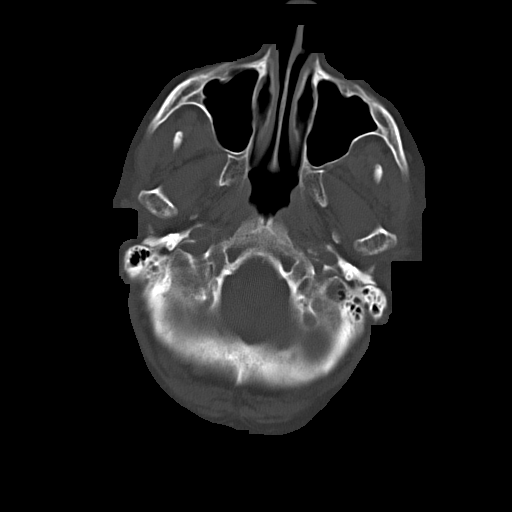
[im 7/32  bone]
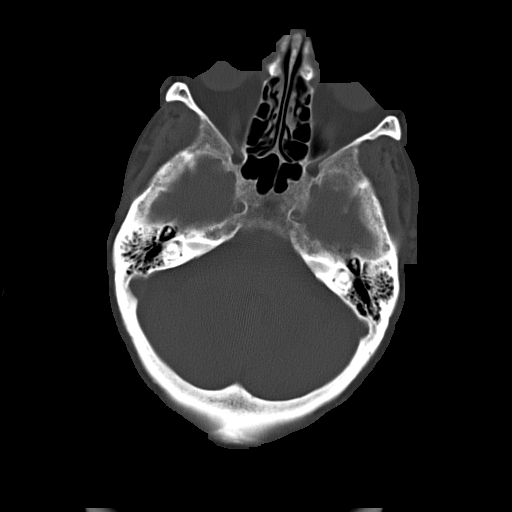
[im 12/32  bone]
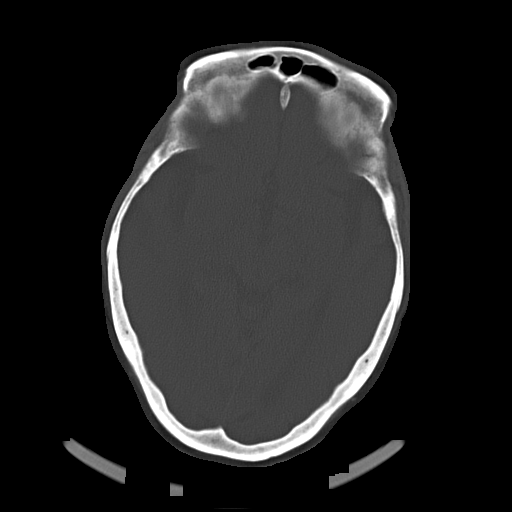

[16 of 30 positions shown; findings below may reference images not displayed]

FINDINGS: Multiple small areas of acute infarct in the cerebellum bilaterally
as well as in the cerebral hemispheres and basal ganglia bilaterally
are best seen on prior MRI. These are difficult to see on CT. No
large territory acute infarct. Negative for hemorrhage or mass

Moderate atrophy. Mild chronic microvascular ischemic change in the
white matter.

Mucous retention cyst right maxillary sinus.  Calvarium intact
IMPRESSION: Generalized atrophy with mild chronic microvascular ischemia.

Numerous small areas of acute infarct throughout the brain
bilaterally best seen on MRI. No large territory acute infarct or
hemorrhage.

## 2016-04-24 DIAGNOSIS — I10 Essential (primary) hypertension: Secondary | ICD-10-CM | POA: Diagnosis not present

## 2016-04-24 DIAGNOSIS — E7800 Pure hypercholesterolemia, unspecified: Secondary | ICD-10-CM | POA: Diagnosis not present

## 2016-04-24 DIAGNOSIS — E119 Type 2 diabetes mellitus without complications: Secondary | ICD-10-CM | POA: Diagnosis not present

## 2016-05-01 DIAGNOSIS — F329 Major depressive disorder, single episode, unspecified: Secondary | ICD-10-CM | POA: Diagnosis not present

## 2016-05-01 DIAGNOSIS — I482 Chronic atrial fibrillation: Secondary | ICD-10-CM | POA: Diagnosis not present

## 2016-05-01 DIAGNOSIS — E1142 Type 2 diabetes mellitus with diabetic polyneuropathy: Secondary | ICD-10-CM | POA: Diagnosis not present

## 2016-05-01 DIAGNOSIS — Z299 Encounter for prophylactic measures, unspecified: Secondary | ICD-10-CM | POA: Diagnosis not present

## 2016-05-01 DIAGNOSIS — N184 Chronic kidney disease, stage 4 (severe): Secondary | ICD-10-CM | POA: Diagnosis not present

## 2016-05-01 DIAGNOSIS — Z1389 Encounter for screening for other disorder: Secondary | ICD-10-CM | POA: Diagnosis not present

## 2016-05-08 ENCOUNTER — Ambulatory Visit: Payer: Medicare Other | Admitting: Adult Health

## 2016-05-12 ENCOUNTER — Ambulatory Visit: Payer: Medicare Other | Admitting: Adult Health

## 2016-05-13 ENCOUNTER — Encounter: Payer: Self-pay | Admitting: Adult Health

## 2016-05-15 DIAGNOSIS — L03031 Cellulitis of right toe: Secondary | ICD-10-CM | POA: Diagnosis not present

## 2016-05-15 DIAGNOSIS — E1140 Type 2 diabetes mellitus with diabetic neuropathy, unspecified: Secondary | ICD-10-CM | POA: Diagnosis not present

## 2016-05-15 DIAGNOSIS — B351 Tinea unguium: Secondary | ICD-10-CM | POA: Diagnosis not present

## 2016-05-15 DIAGNOSIS — M79671 Pain in right foot: Secondary | ICD-10-CM | POA: Diagnosis not present

## 2016-05-15 DIAGNOSIS — L110 Acquired keratosis follicularis: Secondary | ICD-10-CM | POA: Diagnosis not present

## 2016-05-15 DIAGNOSIS — E1151 Type 2 diabetes mellitus with diabetic peripheral angiopathy without gangrene: Secondary | ICD-10-CM | POA: Diagnosis not present

## 2016-05-15 DIAGNOSIS — M79674 Pain in right toe(s): Secondary | ICD-10-CM | POA: Diagnosis not present

## 2016-05-20 DIAGNOSIS — H6120 Impacted cerumen, unspecified ear: Secondary | ICD-10-CM | POA: Diagnosis not present

## 2016-05-22 DIAGNOSIS — Z1389 Encounter for screening for other disorder: Secondary | ICD-10-CM | POA: Diagnosis not present

## 2016-05-22 DIAGNOSIS — Z0000 Encounter for general adult medical examination without abnormal findings: Secondary | ICD-10-CM | POA: Diagnosis not present

## 2016-05-22 DIAGNOSIS — Z6826 Body mass index (BMI) 26.0-26.9, adult: Secondary | ICD-10-CM | POA: Diagnosis not present

## 2016-05-22 DIAGNOSIS — Z7189 Other specified counseling: Secondary | ICD-10-CM | POA: Diagnosis not present

## 2016-05-22 DIAGNOSIS — Z1211 Encounter for screening for malignant neoplasm of colon: Secondary | ICD-10-CM | POA: Diagnosis not present

## 2016-05-22 DIAGNOSIS — Z299 Encounter for prophylactic measures, unspecified: Secondary | ICD-10-CM | POA: Diagnosis not present

## 2016-05-23 DIAGNOSIS — E7800 Pure hypercholesterolemia, unspecified: Secondary | ICD-10-CM | POA: Diagnosis not present

## 2016-05-23 DIAGNOSIS — E559 Vitamin D deficiency, unspecified: Secondary | ICD-10-CM | POA: Diagnosis not present

## 2016-05-23 DIAGNOSIS — Z79899 Other long term (current) drug therapy: Secondary | ICD-10-CM | POA: Diagnosis not present

## 2016-05-23 DIAGNOSIS — R5383 Other fatigue: Secondary | ICD-10-CM | POA: Diagnosis not present

## 2016-05-26 DIAGNOSIS — E119 Type 2 diabetes mellitus without complications: Secondary | ICD-10-CM | POA: Diagnosis not present

## 2016-05-26 DIAGNOSIS — I10 Essential (primary) hypertension: Secondary | ICD-10-CM | POA: Diagnosis not present

## 2016-05-26 DIAGNOSIS — E7800 Pure hypercholesterolemia, unspecified: Secondary | ICD-10-CM | POA: Diagnosis not present

## 2016-05-28 ENCOUNTER — Encounter: Payer: Self-pay | Admitting: *Deleted

## 2016-06-03 ENCOUNTER — Ambulatory Visit (INDEPENDENT_AMBULATORY_CARE_PROVIDER_SITE_OTHER): Payer: Medicare Other | Admitting: Cardiology

## 2016-06-03 ENCOUNTER — Encounter: Payer: Self-pay | Admitting: Cardiology

## 2016-06-03 VITALS — BP 168/63 | HR 43 | Ht 62.0 in | Wt 145.2 lb

## 2016-06-03 DIAGNOSIS — I10 Essential (primary) hypertension: Secondary | ICD-10-CM

## 2016-06-03 DIAGNOSIS — I480 Paroxysmal atrial fibrillation: Secondary | ICD-10-CM

## 2016-06-03 DIAGNOSIS — E785 Hyperlipidemia, unspecified: Secondary | ICD-10-CM | POA: Diagnosis not present

## 2016-06-03 DIAGNOSIS — I639 Cerebral infarction, unspecified: Secondary | ICD-10-CM | POA: Diagnosis not present

## 2016-06-03 DIAGNOSIS — I2510 Atherosclerotic heart disease of native coronary artery without angina pectoris: Secondary | ICD-10-CM

## 2016-06-03 DIAGNOSIS — R001 Bradycardia, unspecified: Secondary | ICD-10-CM | POA: Diagnosis not present

## 2016-06-03 MED ORDER — AMLODIPINE BESYLATE 2.5 MG PO TABS: 2.5000 mg | ORAL_TABLET | Freq: Every day | ORAL | 0 refills | Status: DC

## 2016-06-03 NOTE — Progress Notes (Signed)
Cardiology Office Note  Date: 06/03/2016   ID: LEIA Parks, DOB Jan 29, 1930, MRN [id number]  PCP: Glenda Chroman, MD  Primary Cardiologist: Rozann Lesches, MD   Chief Complaint  Patient presents with  . Cardiac follow-up    History of Present Illness: Laura Parks is an 82 y.o. female last seen in June. She presents for a follow-up visit. She is here today with her daughter. No major changes since last encounter. States that she feels somewhat unsteady on her feet but she has had no falls.  We went over her medications. Since cutting back Toprol-XL to 25 mg daily she has not had much increase in her heart rate. This is most likely related to conduction system disease and concurrent use of Aricept. We have discussed stopping Toprol-XL completely and then using Norvasc for blood pressure control.  I reviewed her recent follow-up lab work which showed improving hemoglobin and stable renal function overall. She does not report any obvious bleeding problems on Eliquis.  Past Medical History:  Diagnosis Date  . Anemia   . Cognitive impairment   . Colon cancer ([place])   . Coronary artery disease    Minimal coronary atherosclerosis at cardiac catheterization April 2017  . Essential hypertension, benign   . History of pneumonia   . Iron deficiency anemia    Erosive antral gastritis  . Legal blindness   . Macular degeneration   . Mixed hyperlipidemia   . Stroke [facility]) 01/2016   [place] infract  . Type 2 diabetes mellitus ([place])     Past Surgical History:  Procedure Laterality Date  . APPENDECTOMY    . CARDIAC CATHETERIZATION N/A 01/04/2016   Procedure: Left Heart Cath and Coronary Angiography;  Surgeon: Leonie Man, MD;  Location: [facility] [facility];  Service: Cardiovascular;  Laterality: N/A;  . CATARACT EXTRACTION    . CHOLECYSTECTOMY    . Colon surgery for colon cancer     1992 by Dr Mendel Ryder  . COLONOSCOPY  03/24/2012   Procedure: COLONOSCOPY;  Surgeon: Rogene Houston,  MD;  Location: [facility];  Service: Endoscopy;  Laterality: N/A;  1200  . ESOPHAGOGASTRODUODENOSCOPY  08/04/2012   Procedure: ESOPHAGOGASTRODUODENOSCOPY (EGD);  Surgeon: Rogene Houston, MD;  Location: [facility];  Service: Endoscopy;  Laterality: N/A;  325  . Right knee surgery    . TEE WITHOUT CARDIOVERSION N/A 02/01/2016   Procedure: TRANSESOPHAGEAL ECHOCARDIOGRAM (TEE);  Surgeon: Jerline Pain, MD;  Location: [facility];  Service: Cardiovascular;  Laterality: N/A;  . TUBAL LIGATION      Current Outpatient Prescriptions  Medication Sig Dispense Refill  . acetaminophen (TYLENOL) 500 MG tablet Take 500 mg by mouth as needed.    Marland Kitchen alendronate (FOSAMAX) 70 MG tablet Take 70 mg by mouth every 7 (seven) days. Take with a full glass of water on an empty stomach. - on Fridays    . apixaban (ELIQUIS) 2.5 MG TABS tablet Take 1 tablet (2.5 mg total) by mouth 2 (two) times daily. 180 tablet 11  . atorvastatin (LIPITOR) 10 MG tablet Take 10 mg by mouth daily.    . cholestyramine (QUESTRAN) 4 GM/DOSE powder Take by mouth as needed.    . citalopram (CELEXA) 20 MG tablet Take 1 tablet (20 mg total) by mouth daily. 30 tablet 0  . Cyanocobalamin (VITAMIN B-12) 2500 MCG SUBL Place 2,500 mcg under the tongue daily.     Marland Kitchen donepezil (ARICEPT) 10 MG tablet Take 1 tablet by  mouth daily.    . ergocalciferol (VITAMIN D2) 50000 units capsule Take 50,000 Units by mouth once a week.    . ferrous sulfate 325 (65 FE) MG tablet Take 325 mg by mouth daily with breakfast.    . meclizine (ANTIVERT) 25 MG tablet Take 25 mg by mouth 3 (three) times daily as needed. dizziness    . mirtazapine (REMERON) 15 MG tablet Take 15 mg by mouth at bedtime.    . ondansetron (ZOFRAN-ODT) 4 MG disintegrating tablet Take 4 mg by mouth every 6 (six) hours as needed. Nausea and vomiting    . pioglitazone (ACTOS) 15 MG tablet Take 15 mg by mouth daily.    . Sennosides (SENOKOT PO) Take by mouth as needed.    Marland Kitchen amLODipine (NORVASC)  2.5 MG tablet Take 1 tablet (2.5 mg total) by mouth daily. 30 tablet 0   No current facility-administered medications for this visit.    Allergies:  Codeine   Social History: The patient  reports that she has never smoked. She has never used smokeless tobacco. She reports that she does not drink alcohol or use drugs.   ROS:  Please see the history of present illness. Otherwise, complete review of systems is positive for decreased hearing, memory loss.  All other systems are reviewed and negative.   Physical Exam: VS:  BP (!) 168/63   Pulse (!) 43   Ht 5\' 2"  (1.575 m)   Wt 145 lb 3.2 oz (65.9 kg)   SpO2 97%   BMI 26.56 kg/m , BMI Body mass index is 26.56 kg/m.  Wt Readings from Last 3 Encounters:  06/03/16 145 lb 3.2 oz (65.9 kg)  02/27/16 141 lb (64 kg)  02/06/16 143 lb (64.9 kg)    Elderly woman, appears comfortable at rest. HEENT: Conjunctiva and lids normal, oropharynx clear. Neck: Supple, no elevated JVP, soft right carotid bruit, no thyromegaly. Lungs: Clear to auscultation, nonlabored breathing at rest. Cardiac: Regular rate and rhythm with ectopy, no S3, soft systolic murmur, no pericardial rub. Abdomen: Soft, nontender, bowel sounds present, no guarding or rebound. Extremities: No pitting edema, distal pulses 2+. Skin: Warm and dry. Musculoskeletal: No kyphosis. Neuropsychiatric: Alert and oriented 3, affect appropriate.  ECG: I personally reviewed the tracing from 01/30/2016 which showed sinus bradycardia with PVCs, left anterior fascicular block, right bundle branch block.  Recent Labwork: 12/26/2015: B Natriuretic Peptide 252.5 01/30/2016: ALT 12; AST 21; Magnesium 1.8 02/02/2016: BUN 21; Creatinine, Ser 1.30; Hemoglobin 9.4; Platelets 143; Potassium 4.3; Sodium 141 02/06/2016: TSH 4.140     Component Value Date/Time   CHOL 130 01/31/2016 0423   TRIG 46 01/31/2016 0423   HDL 49 01/31/2016 0423   CHOLHDL 2.7 01/31/2016 0423   VLDL 9 01/31/2016 0423   LDLCALC 72  01/31/2016 0423  September 2017: Hemoglobin 10.5, platelets 143, BUN 24, creatinine 1.4, potassium 4.7, AST 17, ALT 16, TSH 3.5, cholesterol 157, triglycerides 79, HDL 67, LDL 74  Other Studies Reviewed Today:  Cardiac catheterization 01/04/2016: 1. Prox RCA lesion, 20% stenosed. 2. Ost Ramus to Ramus lesion, 40% stenosed. 3. Elevated LVEDP, 27 mmHg  Angiographically minimal coronary disease. No culprit lesion to explain significant elevation in troponin.  Transesophageal echocardiogram 02/01/2016: Study Conclusions  - Left ventricle: Systolic function was normal. The estimated  ejection fraction was in the range of 60% to 65%. Wall motion was  normal; there were no regional wall motion abnormalities. - Aortic valve: Trileaflet; normal thickness, mildly calcified  leaflets. - Mitral valve:  Mildly calcified annulus. There was mild  regurgitation. - Left atrium: No evidence of thrombus in the atrial cavity or  appendage. No evidence of thrombus in the atrial cavity or  appendage. - Right atrium: No evidence of thrombus in the atrial cavity or  appendage. - Atrial septum: No defect or patent foramen ovale was identified.  Echo contrast study showed no right-to-left atrial level shunt,  following an increase in RA pressure induced by provocative  maneuvers.  Assessment and Plan:  1. Persistent bradycardia. We will stop Toprol-XL altogether. She is on Aricept and also likely has conduction system disease.  2. Essential hypertension, start Norvasc at 2.5 mg daily since Toprol-XL being stopped. Patient's blood pressure is checked at home.  3. PAF with history of stroke. She is on Eliquis. I reviewed her most recent lab work. She has had no bleeding problems.  4. Hyperlipidemia, continues on Lipitor.  5. Overall mild coronary atherosclerosis by recent cardiac catheterization in April of this year.  Current medicines were reviewed with the patient today.  Disposition:  Follow-up with me in 3 months.  Signed, Satira Sark, MD, [facility] 06/03/2016 2:09 PM    [facility] [place], [place], [place] [address] Phone: [phone]; Fax: [phone]

## 2016-06-03 NOTE — Patient Instructions (Signed)
Your physician recommends that you schedule a follow-up appointment in: [place]  Your physician has recommended you make the following change in your medication:   STOP METOPROLOL   START AMLODIPINE 2.5 MG DAILY - WE HAVE SENT 30 DAY SUPPLY TO [facility]   Thank you for choosing [facility]!!

## 2016-06-05 ENCOUNTER — Telehealth: Payer: Self-pay | Admitting: Cardiology

## 2016-06-05 NOTE — Telephone Encounter (Signed)
Patient's daughter returned Dr McDowell's nurses call

## 2016-06-05 NOTE — Telephone Encounter (Signed)
Returned call to daughter [name]) - stated she had already been notified of these results at [facility] on 06/03/2016 with Dr. Domenic Polite.

## 2016-06-06 ENCOUNTER — Encounter (INDEPENDENT_AMBULATORY_CARE_PROVIDER_SITE_OTHER): Payer: Self-pay | Admitting: Internal Medicine

## 2016-06-09 ENCOUNTER — Ambulatory Visit (INDEPENDENT_AMBULATORY_CARE_PROVIDER_SITE_OTHER): Payer: Medicare Other | Admitting: Adult Health

## 2016-06-09 ENCOUNTER — Encounter: Payer: Self-pay | Admitting: Adult Health

## 2016-06-09 VITALS — BP 166/68 | HR 62 | Ht 62.0 in | Wt 143.6 lb

## 2016-06-09 DIAGNOSIS — Z8673 Personal history of transient ischemic attack (TIA), and cerebral infarction without residual deficits: Secondary | ICD-10-CM | POA: Diagnosis not present

## 2016-06-09 DIAGNOSIS — R413 Other amnesia: Secondary | ICD-10-CM

## 2016-06-09 DIAGNOSIS — I639 Cerebral infarction, unspecified: Secondary | ICD-10-CM | POA: Diagnosis not present

## 2016-06-09 NOTE — Patient Instructions (Signed)
Continue Aricept  Memory score has approved  Continue eliquis Blood Pressure goal <130/90 Cholesterol LDL <70 HgbA1c <6.5 % If your symptoms worsen or you develop new symptoms please let us know.

## 2016-06-09 NOTE — Progress Notes (Signed)
PATIENT: Laura Parks DOB: 10-12-1929  REASON FOR VISIT: follow up stroke, memory changes- HISTORY FROM: patient  HISTORY OF PRESENT ILLNESS: Today: 06/09/2016:  Laura Parks is an 82 year old female with a history of dementia and strokes. She returns today for follow-up. She is currently on Eliquis for stroke prevention. Her blood pressure is slightly elevated today. She reports that they recently took her off of her beta blocker. If her blood pressure remains elevatedshe states that her memory care will restart this medication.  Reports that her last hemoglobin A1c was 6. Cholesterol LDL was 74. Patient reports that she is doing well. She lives at home with her husband. She can complete all ADLs independently. She does not drive a motor vehicle due to macular degeneration. She does minimal cooking. She states typically her son or daughter will bring them food. She is on Aricept 10 mg daily. She reports that her mood has improved with Celexa and mirtazapine. She denies any depression. She does report that she has some trouble with fine motor skills in the right hand. She states this occurred after her strokes. She states that she noticed when she tries to replace her battery in her hearing aid she has trouble holding the battery. She returns today for an evaluation.   HISTORY: Per Dr. Jaynee Eagles notes:  Laura Parks is a 82 y.o. female here as a follow up for depression and/or dementia and recent strokes. She snores "like a train". She sleeps all day long. She goes to bed by 10pm. She wakes up at 9am. She sleeps all day.   Interval history 02/06/2016: Neuropsychiatric testing 2 years ago showed that most of her symptoms were at that time likely due to pseudodementia secondary to severe depression.Since then she has had strokes and memory decline.  She had a recent admission 02/01/2016 for stroke. She has a questionable history of atrial fibrillation discovered 1 month ago, per history from her  daughter. She was seen by cardiologist and it is unclear if she actually had a documented A. fib or not, per cardiologist. Clinically, patient did not have any focal neurological symptoms, no new speech problems or focal sensory or motor symptoms. She does have poor visual activity at baseline and narrow visual field from macular degeneration which has been grossly unchanged. She was started on heparin drip at [facility] prior to [facility] this month. MRI of the brain showed Multiple bilateral anterior and posterior circulation infarcts, embolic secondary to cardiac source, highly suspicious for afib although has not confirmed yet, Multiple previous subacute infarcts, Extensive white matter disease in both hemispheres, Multiple remote lacunar infarcts are also noted, stable. She was started on eliquis.  She takes b12 every day (unclear). She is due for home PT, OT and a nurse and they will be coming out to the house 3x a week. Since the strokes in April her memory and confusion have increased. Her MMSe today is 9/27. She forgets the way to her bathroom in the house. She can't cook anymore. She gets confused doing anything. Her ability to do things has declined, she is now using a walker and wheelchair. She lives with husband and son and daughter is frequently there. Daughter provides al information today.   Interval History 08/31/2014: She is not eating. It is like she is committing suicide per daughter. Daughter is crying. Says mother is committing suicide slowly. Patient is anemic. Patient is dehydrated. She lost 18 pounds in 3 weeks. She  will not eat. Her sodium levels are low. She feels hopeless. She is taking antidepressant every day. No interest in anything. She always enjoyed reading and now she can't do that. Patient's husband is worried. Daughter provides most of the information. Reviewed MRI images that showed generalized atrophy congruent with age otherwise unremarkable.  B12 194.    Inital visit 07/25/2014: Laura Parks is a 82 y.o. female here as a referral from Dr. Woody Seller for cognitive and behavioral changes. Daughter provides most of the information. 3 years ago noticed changes in her mother, she was losing her glasses and losing her purse and articles of clothing. It continued and progressed. Patient doesn't want to socially interact, doesn't want to go to church on Sundays and used to love to go dancing and won't go there either. She has stopped wanting to do anything. Daughter and son pay bills. She doesn't cook anymore, last night she tried and burned the food. She doesn't cook because she leaves burners on. This past summer daughter noticed worsening changes. In October patient became silent and distant, "in her own world". Then on October 19th patient asked daughter to come home and said it was going to be all right, patient thought the police were going to arrest her for brother's death and she is going to prison. Patient has been perseverating on going to jail since then and dieing in prison. Patient reports she did have something to do with her brother's death but doesn't know what she did. She says "I have no joy, no love" in my life. Patient agrees she is having memory problems, forgetting to turn the stove off. She is forgetting to feed the dog recently. Husband is also noticing the changes in memory and doesn't know what to do about it.   She feels sad most days, she has loss of interest in almost everything she used to do. She used to go shopping and doesn't want to do that anymore. She endorses decreased energy, fatigue, irregular sleep, decreased appetite and decreased intake, "I simply do not want anymore to eat". Restless all day.   Daughter also reports her mother is slowing down. Balance is poor, no falls. But has lost her balance.   Mother with alzheimers.   Reviewed notes, labs and imaging from outside physicians, which showed: cbc 11/2013  with anemia. cmp unremarkable. hgba1c 5.6. ldl 63. tsh wnl. Carotid doppler study 02/2014 with evidence of 40-59% bilateral ICA stenosis. Echocardiogram done at [facility] in October 2014 reported normal LV wall thickness with LVEF 60-65%, mild left atrial enlargement, severe MAC with mildly thickened mitral valve and mild mitral regurgitation, moderately sclerotic aortic valve, mild tricuspid regurgitation. The patient has history of colon carcinoma. A review of notes in [facility] through 07/2012 (mainly cardiology and gastro) don't mention any apparent cognitive dysfunction. Reviewed primary care notes provided from [facility] internal, last OP note mentions that patient and granddaughter requesting a neuro referral for recent delusions about going to prison, changes in walking, leaving stove on, inability to do serial 7s and draw clock   REVIEW OF SYSTEMS: Out of a complete 14 system review of symptoms, the patient complains only of the following symptoms, and all other reviewed systems are negative.  Runny nose, rectal bleeding, black stools, incontinence of bowels, headache, memory loss, bruise/bleed easily, anemia  ALLERGIES: Allergies  Allergen Reactions  . Codeine Nausea Only    HOME MEDICATIONS: Outpatient Medications Prior to Visit  Medication Sig Dispense Refill  . acetaminophen (  TYLENOL) 500 MG tablet Take 500 mg by mouth as needed.    Marland Kitchen alendronate (FOSAMAX) 70 MG tablet Take 70 mg by mouth every 7 (seven) days. Take with a full glass of water on an empty stomach. - on Fridays    . amLODipine (NORVASC) 2.5 MG tablet Take 1 tablet (2.5 mg total) by mouth daily. 30 tablet 0  . apixaban (ELIQUIS) 2.5 MG TABS tablet Take 1 tablet (2.5 mg total) by mouth 2 (two) times daily. 180 tablet 11  . atorvastatin (LIPITOR) 10 MG tablet Take 10 mg by mouth daily.    . cholestyramine (QUESTRAN) 4 GM/DOSE powder Take by mouth as needed.    . citalopram (CELEXA) 20 MG tablet Take 1 tablet (20 mg  total) by mouth daily. 30 tablet 0  . Cyanocobalamin (VITAMIN B-12) 2500 MCG SUBL Place 2,500 mcg under the tongue daily.     Marland Kitchen donepezil (ARICEPT) 10 MG tablet Take 1 tablet by mouth daily.    . ergocalciferol (VITAMIN D2) 50000 units capsule Take 50,000 Units by mouth once a week.    . ferrous sulfate 325 (65 FE) MG tablet Take 325 mg by mouth daily with breakfast.    . meclizine (ANTIVERT) 25 MG tablet Take 25 mg by mouth 3 (three) times daily as needed. dizziness    . mirtazapine (REMERON) 15 MG tablet Take 15 mg by mouth at bedtime.    . ondansetron (ZOFRAN-ODT) 4 MG disintegrating tablet Take 4 mg by mouth every 6 (six) hours as needed. Nausea and vomiting    . pioglitazone (ACTOS) 15 MG tablet Take 15 mg by mouth daily.    . Sennosides (SENOKOT PO) Take by mouth as needed.     No facility-administered medications prior to visit.     PAST MEDICAL HISTORY: Past Medical History:  Diagnosis Date  . Anemia   . Cognitive impairment   . Colon cancer ([place])   . Coronary artery disease    Minimal coronary atherosclerosis at cardiac catheterization April 2017  . Essential hypertension, benign   . History of pneumonia   . Iron deficiency anemia    Erosive antral gastritis  . Legal blindness   . Macular degeneration   . Mixed hyperlipidemia   . Stroke [facility]) 01/2016   [place] infract  . Type 2 diabetes mellitus ([place])     PAST SURGICAL HISTORY: Past Surgical History:  Procedure Laterality Date  . APPENDECTOMY    . CARDIAC CATHETERIZATION N/A 01/04/2016   Procedure: Left Heart Cath and Coronary Angiography;  Surgeon: Leonie Man, MD;  Location: [facility];  Service: Cardiovascular;  Laterality: N/A;  . CATARACT EXTRACTION    . CHOLECYSTECTOMY    . Colon surgery for colon cancer     1992 by Dr Mendel Ryder  . COLONOSCOPY  03/24/2012   Procedure: COLONOSCOPY;  Surgeon: Rogene Houston, MD;  Location: [facility];  Service: Endoscopy;  Laterality: N/A;  1200  .  ESOPHAGOGASTRODUODENOSCOPY  08/04/2012   Procedure: ESOPHAGOGASTRODUODENOSCOPY (EGD);  Surgeon: Rogene Houston, MD;  Location: [facility];  Service: Endoscopy;  Laterality: N/A;  325  . Right knee surgery    . TEE WITHOUT CARDIOVERSION N/A 02/01/2016   Procedure: TRANSESOPHAGEAL ECHOCARDIOGRAM (TEE);  Surgeon: Jerline Pain, MD;  Location: [facility] ENDOSCOPY;  Service: Cardiovascular;  Laterality: N/A;  . TUBAL LIGATION      FAMILY HISTORY: Family History  Problem Relation Age of Onset  . COPD Mother   . Alzheimer's disease  Mother   . Congestive Heart Failure Mother   . Diabetes Brother     SOCIAL HISTORY: Social History   Social History  . Marital status: Married    Spouse name: N/A  . Number of children: N/A  . Years of education: N/A   Occupational History  . Not on file.   Social History Main Topics  . Smoking status: Never Smoker  . Smokeless tobacco: Never Used  . Alcohol use No  . Drug use: No  . Sexual activity: Not on file   Other Topics Concern  . Not on file   Social History Narrative  . No narrative on file      PHYSICAL EXAM  Vitals:   06/09/16 1359  BP: (!) 166/68  Pulse: 62  Weight: 143 lb 9.6 oz (65.1 kg)  Height: 5\' 2"  (1.575 m)   Body mass index is 26.26 kg/m.   MMSE - Mini Mental State Exam 06/09/2016 02/06/2016  Not completed: - (No Data)  Orientation to time 3 2  Orientation to Place 4 1  Registration 3 1  Attention/ Calculation 1 0  Recall 2 0  Language- name 2 objects 2 2  Language- repeat 1 0  Language- follow 3 step command 3 3  Language- read & follow direction 0 0  Write a sentence 0 0  Copy design 0 0  Total score 19 9     Generalized: Well developed, in no acute distress   Neurological examination  Mentation: Alert . Follows all commands speech and language fluent Cranial nerve II-XII: Pupils were equal round reactive to light. Extraocular movements were full, visual field were full on confrontational test. Facial  sensation and strength were normal. Uvula tongue midline. Head turning and shoulder shrug  were normal and symmetric. Motor: The motor testing reveals 5 over 5 strength of all 4 extremities. Good symmetric motor tone is noted throughout.  Sensory: Sensory testing is intact to soft touch on all 4 extremities. No evidence of extinction is noted.  Coordination: Cerebellar testing reveals good finger-nose-finger and heel-to-shin bilaterally.  Gait and station: Gait is normal. Tandem gaitnot attended.. Romberg is negative. No drift is seen.  Reflexes: Deep tendon reflexes are symmetric and normal bilaterally.   DIAGNOSTIC DATA (LABS, IMAGING, TESTING) - I reviewed patient records, labs, notes, testing and imaging myself where available.  Lab Results  Component Value Date   WBC 7.3 02/02/2016   HGB 9.4 (L) 02/02/2016   HCT 30.2 (L) 02/02/2016   MCV 87.3 02/02/2016   PLT 143 (L) 02/02/2016      Component Value Date/Time   NA 141 02/02/2016 0507   K 4.3 02/02/2016 0507   CL 107 02/02/2016 0507   CO2 23 02/02/2016 0507   GLUCOSE 100 (H) 02/02/2016 0507   BUN 21 (H) 02/02/2016 0507   CREATININE 1.30 (H) 02/02/2016 0507   CALCIUM 8.7 (L) 02/02/2016 0507   PROT 6.8 01/30/2016 1535   ALBUMIN 3.6 01/30/2016 1535   AST 21 01/30/2016 1535   ALT 12 (L) 01/30/2016 1535   ALKPHOS 71 01/30/2016 1535   BILITOT 0.6 01/30/2016 1535   GFRNONAA 37 (L) 02/02/2016 0507   GFRAA 43 (L) 02/02/2016 0507   Lab Results  Component Value Date   CHOL 130 01/31/2016   HDL 49 01/31/2016   LDLCALC 72 01/31/2016   TRIG 46 01/31/2016   CHOLHDL 2.7 01/31/2016   Lab Results  Component Value Date   HGBA1C 6.9 (H) 01/31/2016   Lab Results  Component Value Date   VITAMINB12 >2000 (H) 02/06/2016   Lab Results  Component Value Date   TSH 4.140 02/06/2016      ASSESSMENT AND PLAN 82 y.o. year old female  has a past medical history of Anemia; Cognitive impairment; Colon cancer ([place]); Coronary artery  disease; Essential hypertension, benign; History of pneumonia; Iron deficiency anemia; Legal blindness; Macular degeneration; Mixed hyperlipidemia; Stroke ([place]) (01/2016); and Type 2 diabetes mellitus ([place]). here with:  1. History of strokes  2. Memory disturbance   The patient's memory score has improved significantly since the last visit. She will remain on Aricept 10 mg daily. The patient is instructed to continue on eliquis for stroke prevention. She should maintain strict control of her blood pressure with goal less than 130/90. Cholesterol LDL less than 70 and hemoglobin A1c less than 6.5%. Advised patient that if she develops any strokelike symptoms she should go to the emergency room immediately. Patient voiced understanding. She will follow-up in 6 months with Dr. Henri Medal, MSN, NP-C 06/09/2016, 2:03 PM [facility] [address], [place], [place] [address] [phone]

## 2016-06-10 ENCOUNTER — Encounter (INDEPENDENT_AMBULATORY_CARE_PROVIDER_SITE_OTHER): Payer: Self-pay | Admitting: Internal Medicine

## 2016-06-10 ENCOUNTER — Ambulatory Visit (INDEPENDENT_AMBULATORY_CARE_PROVIDER_SITE_OTHER): Payer: Medicare Other | Admitting: Internal Medicine

## 2016-06-10 VITALS — BP 140/68 | HR 76 | Temp 98.0°F | Resp 18 | Ht 62.0 in | Wt 144.5 lb

## 2016-06-10 DIAGNOSIS — D649 Anemia, unspecified: Secondary | ICD-10-CM | POA: Diagnosis not present

## 2016-06-10 DIAGNOSIS — I639 Cerebral infarction, unspecified: Secondary | ICD-10-CM

## 2016-06-10 DIAGNOSIS — Z85038 Personal history of other malignant neoplasm of large intestine: Secondary | ICD-10-CM | POA: Diagnosis not present

## 2016-06-10 DIAGNOSIS — R195 Other fecal abnormalities: Secondary | ICD-10-CM

## 2016-06-10 DIAGNOSIS — K529 Noninfective gastroenteritis and colitis, unspecified: Secondary | ICD-10-CM | POA: Diagnosis not present

## 2016-06-10 NOTE — Progress Notes (Signed)
Reason for consultation;  Anemia and heme positive stool.  History of present illness:  Patient is 82 year old Caucasian female who is referred through courtesy of Dr. Woody Seller for GI evaluation. She is accompanied by her daughter [name]. She was noted to have heme-positive stool and hemoglobin of 10.5. Patient has history of colon carcinoma status post right hemicolectomy in 1992. She has surveillance colonoscopy in July 2013 revealing benign anastomotic ulcer and normal sigmoid colon biopsy. She was evaluated in November 2013 for iron deficiency anemia and underwent EGD which revealed H. pylori gastritis for she was treated. She denies melena or rectal bleeding hematuria or vaginal bleeding. She states her stools have been dark. She is somewhat limited because of visual impairment. She denies abdominal pain nausea or vomiting. She also denies heartburn or dysphagia. She states she has had diarrhea for more than 30 years. He used to be on paregoric which worked very well but it is not available anymore. Lately she has been taking cholestyramine and having good results. She has 2-5 stools per day. Most of her stools are soft or semi-formed. She has standing order for senna "just in case she becomes constipated. Her daughter states she may have taken single dose this year. Her appetite is fair and she has not lost any weight. She is on Apixaban but does not take other OTC NSAIDs. She is very much interested in undergoing colonoscopy because of a personal history and the fact that her younger brother was recently diagnosed with colon carcinoma.    Current Medications: Outpatient Encounter Prescriptions as of 06/10/2016  Medication Sig  . acetaminophen (TYLENOL) 500 MG tablet Take 500 mg by mouth as needed.  Marland Kitchen alendronate (FOSAMAX) 70 MG tablet Take 70 mg by mouth every 7 (seven) days. Take with a full glass of water on an empty stomach. - on Fridays  . amLODipine (NORVASC) 2.5 MG tablet Take 1 tablet  (2.5 mg total) by mouth daily.  Marland Kitchen apixaban (ELIQUIS) 2.5 MG TABS tablet Take 1 tablet (2.5 mg total) by mouth 2 (two) times daily.  Marland Kitchen atorvastatin (LIPITOR) 10 MG tablet Take 10 mg by mouth daily.  . cholestyramine (QUESTRAN) 4 GM/DOSE powder Take by mouth as needed.  . citalopram (CELEXA) 20 MG tablet Take 1 tablet (20 mg total) by mouth daily.  . Cyanocobalamin (VITAMIN B-12) 2500 MCG SUBL Place 2,500 mcg under the tongue daily.   Marland Kitchen donepezil (ARICEPT) 10 MG tablet Take 1 tablet by mouth daily.  . ergocalciferol (VITAMIN D2) 50000 units capsule Take 50,000 Units by mouth once a week.  . ferrous sulfate 325 (65 FE) MG tablet Take 325 mg by mouth daily with breakfast.  . meclizine (ANTIVERT) 25 MG tablet Take 25 mg by mouth 3 (three) times daily as needed. dizziness  . mirtazapine (REMERON) 15 MG tablet Take 15 mg by mouth at bedtime.  . ondansetron (ZOFRAN-ODT) 4 MG disintegrating tablet Take 4 mg by mouth every 6 (six) hours as needed. Nausea and vomiting  . pioglitazone (ACTOS) 15 MG tablet Take 15 mg by mouth daily.  . Sennosides (SENOKOT PO) Take by mouth as needed.   No facility-administered encounter medications on file as of 06/10/2016.    Past medical history: Hypertension. Hyperlipidemia. History of colon carcinoma. She underwent right hemicolectomy in 1992. Last colonoscopy in July 2013 revealed small anastomotic ulcer and biopsy revealed benign etiology. She also had diverticula throughout the colon. Sigmoid colon biopsy was negative for microscopic colitis Coronary artery disease. Last cardiac cath was  in April 2017. History of paroxysmal atrial fibrillation. Chronic anemia. She was diagnosed with iron deficiency act in November 2013 and she underwent EGD and found to have H. pylori gastritis. She was treated. Macular degeneration resulting in significant visual impairment and patient is legally blind. Diabetes mellitus of 25 years duration. Osteoporosis. History of  dementia. Multiple CVAs felt to be embolic. Last episode was in May 2017. Bilateral cataract extraction several years ago. Cholecystectomy in 2012. She had surgery for right patellar fracture 11 years ago and redo in March 2012 when she fell and broke it again.    Allergies: Allergies  Allergen Reactions  . Codeine Nausea Only    Family history: Mother had COPD and lived to be 84. Health status of father unknown. She has 4 brothers ages 48 73 85 and 59. One brother just had surgery for colon carcinoma. He is 80 years old.  Social history: Patient is married. She lives with her husband and son in [place]. Her daughter lives 45 minutes today but visits them regularly. One son was murdered at age 30. She worked in [employer] for over 25 years but now is retired. She has never smoked cigarettes or drank alcohol.   Physical examination: Blood pressure 140/68, pulse 76, temperature 98 F (36.7 C), temperature source Oral, resp. rate 18, height 5\' 2"  (1.575 m), weight 144 lb 8 oz (65.5 kg). Patient is alert and in no acute distress. Conjunctiva is pink. Sclera is nonicteric Oropharyngeal mucosa is normal. No neck masses or thyromegaly noted. Cardiac exam with regular rhythm normal S1 and S2. Faint systolic ejection murmur noted at left sternal border. Lungs are clear to auscultation. Abdomen is symmetrical soft and nontender without organomegaly or masses. Rectal examination deferred.  No LE edema or clubbing noted.  Labs/studies Results: Lab data from 05/26/2016  WBC 5.7, H&H 10.5 and 32.5, MCV 85 and platelet count 143K.  Glucose 82, BUN 24, creatinine 1.37  Serum calcium 9.4  Serum sodium 144, potassium 4.7, chloride 105, CO2 23  Bilirubin 0.4, AP 85, AST 17, ALT 16, total protein 6.4 and albumin 4.1.    Assessment:  #1. Heme positive stool. She has not experienced overt GI bleed despite being on anticoagulant which is reassuring. Given history of colon carcinoma  and her concern diagnostic colonoscopy would be appropriate. She will need to be off anticoagulant for 2 days but will check with her neurologist first. #2. Anemia. She's had low hemoglobin for several months although hemoglobin was over 12 g in April this year. Suspect she has anemia of chronic disease. If she has iron deficiency she may benefit from parenteral iron as oral iron might not be helping her. #3. Chronic diarrhea. Chronic diarrhea may be related to prior right hemicolectomy. She is doing well with cholestyramine which she should continue. Sigmoid colon biopsies in July 2013 were negative for microscopic colitis.    Recommendations:  She will continue cholestyramine for diarrhea as she is doing. Will check serum iron TIBC and ferritin. Diagnostic colonoscopy would be scheduled after conferring with her neurologist Dr. Lavell Anchors about the need for anticoagulant interruption.

## 2016-06-10 NOTE — Patient Instructions (Signed)
Physician will call with results of blood test and with further recommendations

## 2016-06-12 DIAGNOSIS — R195 Other fecal abnormalities: Secondary | ICD-10-CM | POA: Diagnosis not present

## 2016-06-12 DIAGNOSIS — D649 Anemia, unspecified: Secondary | ICD-10-CM | POA: Diagnosis not present

## 2016-06-16 ENCOUNTER — Encounter (INDEPENDENT_AMBULATORY_CARE_PROVIDER_SITE_OTHER): Payer: Self-pay

## 2016-06-17 ENCOUNTER — Encounter (INDEPENDENT_AMBULATORY_CARE_PROVIDER_SITE_OTHER): Payer: Self-pay | Admitting: *Deleted

## 2016-06-17 ENCOUNTER — Telehealth (INDEPENDENT_AMBULATORY_CARE_PROVIDER_SITE_OTHER): Payer: Self-pay | Admitting: *Deleted

## 2016-06-17 ENCOUNTER — Telehealth: Payer: Self-pay | Admitting: Neurology

## 2016-06-17 MED ORDER — PEG 3350-KCL-NA BICARB-NACL 420 G PO SOLR: 4000.0000 mL | Freq: Once | ORAL | 0 refills | Status: AC

## 2016-06-17 NOTE — Telephone Encounter (Signed)
Patient need trilyte 

## 2016-06-17 NOTE — Telephone Encounter (Signed)
Ann/Dr. Laural Golden [phone] called to advise patient is scheduled for Colonoscopy October 19th, needs to know if patient can stop apixaban (ELIQUIS) 2.5 MG TABS tablet 2 days before? Please call to advise. Ann advises, they are on epic.

## 2016-06-17 NOTE — Telephone Encounter (Signed)
Dr Ahern- please advise 

## 2016-06-18 ENCOUNTER — Other Ambulatory Visit (INDEPENDENT_AMBULATORY_CARE_PROVIDER_SITE_OTHER): Payer: Self-pay | Admitting: *Deleted

## 2016-06-18 DIAGNOSIS — K921 Melena: Secondary | ICD-10-CM

## 2016-06-18 DIAGNOSIS — Z800 Family history of malignant neoplasm of digestive organs: Secondary | ICD-10-CM

## 2016-06-18 DIAGNOSIS — D649 Anemia, unspecified: Secondary | ICD-10-CM | POA: Insufficient documentation

## 2016-06-18 DIAGNOSIS — D508 Other iron deficiency anemias: Secondary | ICD-10-CM

## 2016-06-18 NOTE — Telephone Encounter (Signed)
Patient can stop Eliquis for procedure. Patient should know there is a small but acceptable risk for peri-operative stroke while off of the Eliquis. She should restart the Eliquis as soon as safely possible after procedure. If appropriate would like patient to be on asa 81mg  while off of Eliquis per physician discretion. thanks

## 2016-06-18 NOTE — Telephone Encounter (Signed)
LVM returning call from [place]. Advised Dr Jaynee Eagles provided response and she can see via phone note. Asked her to call back if they need anything further. Gave [facility] phone number if she has further questions.

## 2016-06-19 ENCOUNTER — Telehealth: Payer: Self-pay | Admitting: Cardiology

## 2016-06-19 NOTE — Telephone Encounter (Signed)
Perhaps increase Norvasc to 5 mg daily to see if this provides better blood pressure control.

## 2016-06-19 NOTE — Telephone Encounter (Signed)
Pt daughter says since switching from metoprolol to amlodipine 2.5 mg BP has been as follows [id number] systolic 82N-05L diastolic. Wanting to know if pt is to continue amlodipine. Routed to Dr. Domenic Polite

## 2016-06-19 NOTE — Telephone Encounter (Signed)
[name] (daughter) called stating that she was told to contact the office with blood pressure readings.  Please call [phone]

## 2016-06-20 MED ORDER — AMLODIPINE BESYLATE 5 MG PO TABS: 5.0000 mg | ORAL_TABLET | Freq: Every day | ORAL | 0 refills | Status: DC

## 2016-06-20 MED ORDER — AMLODIPINE BESYLATE 5 MG PO TABS: 5.0000 mg | ORAL_TABLET | Freq: Every day | ORAL | 3 refills | Status: DC

## 2016-06-20 NOTE — Telephone Encounter (Signed)
Pt daughter aware, requested 30 day supply sent to [facility] and [facility] day [facility]. Medication sent to pharmacy.

## 2016-06-30 ENCOUNTER — Encounter (INDEPENDENT_AMBULATORY_CARE_PROVIDER_SITE_OTHER): Payer: Self-pay

## 2016-07-03 ENCOUNTER — Ambulatory Visit (HOSPITAL_COMMUNITY)
Admission: RE | Admit: 2016-07-03 | Discharge: 2016-07-03 | Disposition: A | Discharge status: Home / Self Care | Payer: Medicare Other | Source: Ambulatory Visit | Attending: Internal Medicine | Admitting: Internal Medicine

## 2016-07-03 ENCOUNTER — Encounter (HOSPITAL_COMMUNITY): Payer: Self-pay | Admitting: *Deleted

## 2016-07-03 ENCOUNTER — Encounter (HOSPITAL_COMMUNITY)
Admission: RE | Disposition: A | Discharge status: Home / Self Care | Payer: Self-pay | Source: Ambulatory Visit | Attending: Internal Medicine

## 2016-07-03 DIAGNOSIS — E782 Mixed hyperlipidemia: Secondary | ICD-10-CM | POA: Insufficient documentation

## 2016-07-03 DIAGNOSIS — Z836 Family history of other diseases of the respiratory system: Secondary | ICD-10-CM | POA: Insufficient documentation

## 2016-07-03 DIAGNOSIS — Z9851 Tubal ligation status: Secondary | ICD-10-CM | POA: Diagnosis not present

## 2016-07-03 DIAGNOSIS — Z7901 Long term (current) use of anticoagulants: Secondary | ICD-10-CM | POA: Diagnosis not present

## 2016-07-03 DIAGNOSIS — K5730 Diverticulosis of large intestine without perforation or abscess without bleeding: Secondary | ICD-10-CM | POA: Insufficient documentation

## 2016-07-03 DIAGNOSIS — I2510 Atherosclerotic heart disease of native coronary artery without angina pectoris: Secondary | ICD-10-CM | POA: Insufficient documentation

## 2016-07-03 DIAGNOSIS — D509 Iron deficiency anemia, unspecified: Secondary | ICD-10-CM | POA: Insufficient documentation

## 2016-07-03 DIAGNOSIS — K635 Polyp of colon: Secondary | ICD-10-CM | POA: Insufficient documentation

## 2016-07-03 DIAGNOSIS — Z833 Family history of diabetes mellitus: Secondary | ICD-10-CM | POA: Insufficient documentation

## 2016-07-03 DIAGNOSIS — R195 Other fecal abnormalities: Secondary | ICD-10-CM | POA: Diagnosis not present

## 2016-07-03 DIAGNOSIS — E7800 Pure hypercholesterolemia, unspecified: Secondary | ICD-10-CM | POA: Diagnosis not present

## 2016-07-03 DIAGNOSIS — Z79899 Other long term (current) drug therapy: Secondary | ICD-10-CM | POA: Insufficient documentation

## 2016-07-03 DIAGNOSIS — D649 Anemia, unspecified: Secondary | ICD-10-CM | POA: Diagnosis present

## 2016-07-03 DIAGNOSIS — Z8719 Personal history of other diseases of the digestive system: Secondary | ICD-10-CM | POA: Insufficient documentation

## 2016-07-03 DIAGNOSIS — Z85038 Personal history of other malignant neoplasm of large intestine: Secondary | ICD-10-CM | POA: Diagnosis not present

## 2016-07-03 DIAGNOSIS — H3530 Unspecified macular degeneration: Secondary | ICD-10-CM | POA: Diagnosis not present

## 2016-07-03 DIAGNOSIS — Z980 Intestinal bypass and anastomosis status: Secondary | ICD-10-CM | POA: Diagnosis not present

## 2016-07-03 DIAGNOSIS — Z885 Allergy status to narcotic agent status: Secondary | ICD-10-CM | POA: Insufficient documentation

## 2016-07-03 DIAGNOSIS — K644 Residual hemorrhoidal skin tags: Secondary | ICD-10-CM | POA: Diagnosis not present

## 2016-07-03 DIAGNOSIS — E119 Type 2 diabetes mellitus without complications: Secondary | ICD-10-CM | POA: Diagnosis not present

## 2016-07-03 DIAGNOSIS — Z8673 Personal history of transient ischemic attack (TIA), and cerebral infarction without residual deficits: Secondary | ICD-10-CM | POA: Insufficient documentation

## 2016-07-03 DIAGNOSIS — Z800 Family history of malignant neoplasm of digestive organs: Secondary | ICD-10-CM | POA: Insufficient documentation

## 2016-07-03 DIAGNOSIS — K621 Rectal polyp: Secondary | ICD-10-CM | POA: Diagnosis not present

## 2016-07-03 DIAGNOSIS — Z9049 Acquired absence of other specified parts of digestive tract: Secondary | ICD-10-CM | POA: Insufficient documentation

## 2016-07-03 DIAGNOSIS — D508 Other iron deficiency anemias: Secondary | ICD-10-CM

## 2016-07-03 DIAGNOSIS — I10 Essential (primary) hypertension: Secondary | ICD-10-CM | POA: Insufficient documentation

## 2016-07-03 DIAGNOSIS — K921 Melena: Secondary | ICD-10-CM | POA: Insufficient documentation

## 2016-07-03 HISTORY — PX: COLONOSCOPY: SHX5424

## 2016-07-03 LAB — GLUCOSE, CAPILLARY: GLUCOSE-CAPILLARY: 84 mg/dL (ref 65–99)

## 2016-07-03 LAB — HEMOGLOBIN AND HEMATOCRIT, BLOOD
HCT: 30.7 % — ABNORMAL LOW (ref 36.0–46.0)
Hemoglobin: 9.9 g/dL — ABNORMAL LOW (ref 12.0–15.0)

## 2016-07-03 SURGERY — COLONOSCOPY
Anesthesia: Moderate Sedation

## 2016-07-03 MED ORDER — SODIUM CHLORIDE 0.9 % IV SOLN
INTRAVENOUS | Status: DC
  Administered 2016-07-03: 14:00:00 via INTRAVENOUS

## 2016-07-03 MED ORDER — MEPERIDINE HCL 50 MG/ML IJ SOLN
INTRAMUSCULAR | Status: AC
  Filled 2016-07-03: qty 1

## 2016-07-03 MED ORDER — MEPERIDINE HCL 50 MG/ML IJ SOLN
INTRAMUSCULAR | Status: DC | PRN
  Administered 2016-07-03 (×2): 25 mg via INTRAVENOUS

## 2016-07-03 MED ORDER — MIDAZOLAM HCL 5 MG/5ML IJ SOLN
INTRAMUSCULAR | Status: DC | PRN
  Administered 2016-07-03 (×2): 1 mg via INTRAVENOUS

## 2016-07-03 MED ORDER — FLEET ENEMA 7-19 GM/118ML RE ENEM
1.0000 | ENEMA | Freq: Once | RECTAL | Status: AC
  Administered 2016-07-03: 1 via RECTAL
  Filled 2016-07-03: qty 1

## 2016-07-03 MED ORDER — DEXTROSE-NACL 5-0.9 % IV SOLN: INTRAVENOUS | Status: DC

## 2016-07-03 MED ORDER — MIDAZOLAM HCL 5 MG/5ML IJ SOLN
INTRAMUSCULAR | Status: AC
  Filled 2016-07-03: qty 10

## 2016-07-03 MED ORDER — DEXTROSE-NACL 5-0.9 % IV SOLN
INTRAVENOUS | Status: DC | PRN
  Administered 2016-07-03: 75 mL/h via INTRAVENOUS

## 2016-07-03 NOTE — Discharge Instructions (Signed)
Resume usual medications including Eliquis or Apixaban. Resume usual diet. No driving for 24 hours. H&H in 4 weeks. Office will call.   Colonoscopy, Care After These instructions give you information on caring for yourself after your procedure. Your doctor may also give you more specific instructions. Call your doctor if you have any problems or questions after your procedure. HOME CARE  Do not drive for 24 hours.  Do not sign important papers or use machinery for 24 hours.  You may shower.  You may go back to your usual activities, but go slower for the first 24 hours.  Take rest breaks often during the first 24 hours.  Walk around or use warm packs on your belly (abdomen) if you have belly cramping or gas.  Drink enough fluids to keep your pee (urine) clear or pale yellow.  Resume your normal diet. Avoid heavy or fried foods.  Avoid drinking alcohol for 24 hours or as told by your doctor.  Only take medicines as told by your doctor. If a tissue sample (biopsy) was taken during the procedure:   Do not take aspirin or blood thinners for 7 days, or as told by your doctor.  Do not drink alcohol for 7 days, or as told by your doctor.  Eat soft foods for the first 24 hours. GET HELP IF: You still have a small amount of blood in your poop (stool) 2-3 days after the procedure. GET HELP RIGHT AWAY IF:  You have more than a small amount of blood in your poop.  You see clumps of tissue (blood clots) in your poop.  Your belly is puffy (swollen).  You feel sick to your stomach (nauseous) or throw up (vomit).  You have a fever.  You have belly pain that gets worse and medicine does not help. MAKE SURE YOU:  Understand these instructions.  Will watch your condition.  Will get help right away if you are not doing well or get worse.   This information is not intended to replace advice given to you by your health care provider. Make sure you discuss any questions you have  with your health care provider.   Document Released: 10/04/2010 Document Revised: 09/06/2013 Document Reviewed: 05/09/2013 Elsevier Interactive Patient Education [employer].    Diverticulosis Diverticulosis is the condition that develops when small pouches (diverticula) form in the wall of your colon. Your colon, or large intestine, is where water is absorbed and stool is formed. The pouches form when the inside layer of your colon pushes through weak spots in the outer layers of your colon. CAUSES  No one knows exactly what causes diverticulosis. RISK FACTORS  Being older than 62. Your risk for this condition increases with age. Diverticulosis is rare in people younger than 40 years. By age 61, almost everyone has it.  Eating a low-fiber diet.  Being frequently constipated.  Being overweight.  Not getting enough exercise.  Smoking.  Taking over-the-counter pain medicines, like aspirin and ibuprofen. SYMPTOMS  Most people with diverticulosis do not have symptoms. DIAGNOSIS  Because diverticulosis often has no symptoms, health care providers often discover the condition during an exam for other colon problems. In many cases, a health care provider will diagnose diverticulosis while using a flexible scope to examine the colon (colonoscopy). TREATMENT  If you have never developed an infection related to diverticulosis, you may not need treatment. If you have had an infection before, treatment may include:  Eating more fruits, vegetables, and grains.  Taking a fiber supplement.  Taking a live bacteria supplement (probiotic).  Taking medicine to relax your colon. HOME CARE INSTRUCTIONS   Drink at least 6-8 glasses of water each day to prevent constipation.  Try not to strain when you have a bowel movement.  Keep all follow-up appointments. If you have had an infection before:  Increase the fiber in your diet as directed by your health care provider or  dietitian.  Take a dietary fiber supplement if your health care provider approves.  Only take medicines as directed by your health care provider. SEEK MEDICAL CARE IF:   You have abdominal pain.  You have bloating.  You have cramps.  You have not gone to the bathroom in 3 days. SEEK IMMEDIATE MEDICAL CARE IF:   Your pain gets worse.  Yourbloating becomes very bad.  You have a fever or chills, and your symptoms suddenly get worse.  You begin vomiting.  You have bowel movements that are bloody or black. MAKE SURE YOU:  Understand these instructions.  Will watch your condition.  Will get help right away if you are not doing well or get worse.   This information is not intended to replace advice given to you by your health care provider. Make sure you discuss any questions you have with your health care provider.   Document Released: 05/29/2004 Document Revised: 09/06/2013 Document Reviewed: 07/27/2013 Elsevier Interactive Patient Education [employer].

## 2016-07-03 NOTE — Op Note (Signed)
[facility] Patient Name: Laura Parks Procedure Date: 07/03/2016 1:38 PM MRN: [id number] Date of Birth: 1930/06/03 Attending MD: Hildred Laser , MD CSN: [id number] Age: 82 Admit Type: Outpatient Procedure:                Colonoscopy Indications:              Heme positive stool, anemia and personal history of                            CRC. Providers:                Hildred Laser, MD, Charlyne Petrin RN, RN, Randa Spike, Technician Referring MD:             Glenda Chroman, MD Medicines:                Meperidine 50 mg IV, Midazolam 2 mg IV Complications:            No immediate complications. Estimated Blood Loss:     Estimated blood loss: none. Procedure:                Pre-Anesthesia Assessment:                           - Prior to the procedure, a History and Physical                            was performed, and patient medications and                            allergies were reviewed. The patient's tolerance of                            previous anesthesia was also reviewed. The risks                            and benefits of the procedure and the sedation                            options and risks were discussed with the patient.                            All questions were answered, and informed consent                            was obtained. Prior Anticoagulants: The patient                            last took Eliquis (apixaban) 3 days prior to the                            procedure. ASA Grade Assessment: III - A patient  with severe systemic disease. After reviewing the                            risks and benefits, the patient was deemed in                            satisfactory condition to undergo the procedure.                           After obtaining informed consent, the colonoscope                            was passed under direct vision. Throughout the                            procedure, the  patient's blood pressure, pulse, and                            oxygen saturations were monitored continuously. The                            [id number] ([id number]) scope was introduced through                            the anus and advanced to the the terminal ileum.                            The colonoscopy was performed without difficulty.                            The patient tolerated the procedure well. The                            quality of the bowel preparation was adequate to                            identify polyps 6 mm and larger in size. The                            terminal ileum and the rectum were photographed. Scope In: 2:11:18 PM Scope Out: 2:24:59 PM Scope Withdrawal Time: 0 hours 6 minutes 39 seconds  Total Procedure Duration: 0 hours 13 minutes 41 seconds  Findings:      The neo-terminal ileum appeared normal.      There was evidence of a prior end-to-side ileo-colonic anastomosis at       the hepatic flexure. This was patent and was characterized by healthy       appearing mucosa.      Multiple small and large-mouthed diverticula were found in the proximal       sigmoid colon, mid sigmoid colon and distal sigmoid colon.      External hemorrhoids were found during retroflexion. The hemorrhoids       were small. Impression:               - The examined portion of  the ileum was normal.                           - Patent end-to-side ileo-colonic anastomosis,                            characterized by healthy appearing mucosa.                           - Diverticulosis in the proximal sigmoid colon, in                            the mid sigmoid colon and in the distal sigmoid                            colon.                           - External hemorrhoids.                           - No specimens collected. Moderate Sedation:      Moderate (conscious) sedation was administered by the endoscopy nurse       and supervised by the endoscopist. The following  parameters were       monitored: oxygen saturation, heart rate, blood pressure, CO2       capnography and response to care. Total physician intraservice time was       17 minutes. Recommendation:           - Patient has a contact number available for                            emergencies. The signs and symptoms of potential                            delayed complications were discussed with the                            patient. Return to normal activities tomorrow.                            Written discharge instructions were provided to the                            patient.                           - Resume previous diet daily.                           - Continue present medications.                           - Resume Eliquis (apixaban) at prior dose today.                           - Check hemoglobinin one  month.                           Please note patient's hemoglobin 9.9 g.                           - No repeat colonoscopy due to age. Procedure Code(s):        --- Professional ---                           [phone], Colonoscopy, flexible; diagnostic, including                            collection of specimen(s) by brushing or washing,                            when performed (separate procedure)                           99152, Moderate sedation services provided by the                            same physician or other qualified health care                            professional performing the diagnostic or                            therapeutic service that the sedation supports,                            requiring the presence of an independent trained                            observer to assist in the monitoring of the                            patient's level of consciousness and physiological                            status; initial 15 minutes of intraservice time,                            patient age 20 years or older Diagnosis Code(s):        ---  Professional ---                           K64.4, Residual hemorrhoidal skin tags                           Z98.0, Intestinal bypass and anastomosis status                           R19.5, Other fecal abnormalities  K57.30, Diverticulosis of large intestine without                            perforation or abscess without bleeding CPT copyright 2016 American Medical Association. All rights reserved. The codes documented in this report are preliminary and upon coder review may  be revised to meet current compliance requirements. Hildred Laser, MD Hildred Laser, MD 07/03/2016 2:35:54 PM This report has been signed electronically. Number of Addenda: 0

## 2016-07-03 NOTE — H&P (Signed)
Laura Parks is an 82 y.o. female.   Chief Complaint: Patient is here for colonoscopy. HPI: She is 82 year old Caucasian female with multiple medical problems including history of CRC status post right hemicolectomy 1992 whose last colonoscopy was in July 2013 revealing small anastomotic ulcer benign etiology. 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 She was noted to have anemia and heme positive stool. Because of personal history and these findings she has decided to proceed with colonoscopy. She denies melena or rectal bleeding nausea vomiting or abdominal Parks. History significant for CRC. Her brother who is 66 years old had surgery for colon carcinoma few months ago. Patient has been off Eliquis. Last dose was on 06/30/2016.  Past Medical History:  Diagnosis Date  . Anemia   . Cognitive impairment   . Colon cancer ([place])   . Coronary artery disease    Minimal coronary atherosclerosis at cardiac catheterization April 2017  . Essential hypertension, benign   . History of pneumonia   . Iron deficiency anemia    Erosive antral gastritis  . Legal blindness   . Macular degeneration   . Mixed hyperlipidemia   . Stroke [facility]) 01/2016   [place]  . Type 2 diabetes mellitus ([place])     Past Surgical History:  Procedure Laterality Date  . APPENDECTOMY    . CARDIAC CATHETERIZATION N/A 01/04/2016   Procedure: Left Heart Cath and Coronary Angiography;  Surgeon: Laura Man, MD;  Location: [facility];  Service: Cardiovascular;  Laterality: N/A;  . CATARACT EXTRACTION    . CHOLECYSTECTOMY    . Colon surgery for colon cancer     1992 by Dr Laura Parks  . COLONOSCOPY  03/24/2012   Procedure: COLONOSCOPY;  Surgeon: Laura Houston, MD;  Location: [facility];  Service: Endoscopy;  Laterality: N/A;  1200  . ESOPHAGOGASTRODUODENOSCOPY  08/04/2012   Procedure: ESOPHAGOGASTRODUODENOSCOPY (EGD);  Surgeon: Laura Houston, MD;  Location: [facility];   Service: Endoscopy;  Laterality: N/A;  325  . Right knee surgery    . TEE WITHOUT CARDIOVERSION N/A 02/01/2016   Procedure: TRANSESOPHAGEAL ECHOCARDIOGRAM (TEE);  Surgeon: Laura Pain, MD;  Location: [facility] ENDOSCOPY;  Service: Cardiovascular;  Laterality: N/A;  . TUBAL LIGATION      Family History  Problem Relation Age of Onset  . COPD Mother   . Alzheimer's disease Mother   . Congestive Heart Failure Mother   . Diabetes Brother    Social History:  reports that she has never smoked. She has never used smokeless tobacco. She reports that she does not drink alcohol or use drugs.  Allergies:  Allergies  Allergen Reactions  . Codeine Nausea Only    Medications Prior to Admission  Medication Sig Dispense Refill  . acetaminophen (TYLENOL) 500 MG tablet Take 500 mg by mouth as needed.    Marland Kitchen alendronate (FOSAMAX) 70 MG tablet Take 70 mg by mouth every 7 (seven) days. Take with a full glass of water on an empty stomach. - on Fridays    . amLODipine (NORVASC) 5 MG tablet Take 1 tablet (5 mg total) by mouth daily. 90 tablet 3  . apixaban (ELIQUIS) 2.5 MG TABS tablet Take 1 tablet (2.5 mg total) by mouth 2 (two) times daily. 180 tablet 11  . atorvastatin (LIPITOR) 10 MG tablet Take 10 mg by mouth every evening.     . cholestyramine (QUESTRAN) 4 GM/DOSE powder Take 4 g by mouth as needed.     Marland Kitchen  citalopram (CELEXA) 20 MG tablet Take 1 tablet (20 mg total) by mouth daily. 30 tablet 0  . Cyanocobalamin (VITAMIN B-12) 2500 MCG SUBL Place 2,500 mcg under the tongue daily.     Marland Kitchen donepezil (ARICEPT) 10 MG tablet Take 1 tablet by mouth at bedtime.     . ergocalciferol (VITAMIN D2) 50000 units capsule Take 50,000 Units by mouth once a week.    . ferrous sulfate 325 (65 FE) MG tablet Take 325 mg by mouth daily with breakfast.    . meclizine (ANTIVERT) 25 MG tablet Take 25 mg by mouth 3 (three) times daily as needed. dizziness    . mirtazapine (REMERON) 15 MG tablet Take 15 mg by mouth at bedtime.    .  pioglitazone (ACTOS) 15 MG tablet Take 15 mg by mouth daily.    . ondansetron (ZOFRAN-ODT) 4 MG disintegrating tablet Take 4 mg by mouth every 6 (six) hours as needed. Nausea and vomiting    . Sennosides (SENOKOT PO) Take 1 tablet by mouth as needed.       No results found for this or any previous visit (from the past 48 hour(s)). No results found.  ROS  Blood pressure (!) 135/53, pulse (!) 54, temperature 98.4 F (36.9 C), temperature source Oral, resp. rate 17, height 5\' 2"  (1.575 m), weight 140 lb (63.5 kg), SpO2 98 %. Physical Exam  Constitutional: She appears well-developed and well-nourished.  HENT:  Mouth/Throat: Oropharynx is clear and moist.  Eyes: Conjunctivae are normal. No scleral icterus.  Neck: No thyromegaly present.  Cardiovascular: Normal rate and regular rhythm.   Murmur (faint systolic ejection murmur best heard at left sternal border.) heard. Respiratory: Effort normal and breath sounds normal.  GI: Soft. She exhibits no distension and no mass. There is no tenderness.  Musculoskeletal: She exhibits no edema.  Lymphadenopathy:    She has no cervical adenopathy.  Neurological: She is alert.  Skin: Skin is warm and dry.     Assessment/Plan Heme positive stool and anemia. Personal and family history of CRC. Diagnostic colonoscopy.  Laura Laser, MD 07/03/2016, 1:59 PM

## 2016-07-04 ENCOUNTER — Other Ambulatory Visit (INDEPENDENT_AMBULATORY_CARE_PROVIDER_SITE_OTHER): Payer: Self-pay | Admitting: *Deleted

## 2016-07-04 DIAGNOSIS — Z23 Encounter for immunization: Secondary | ICD-10-CM | POA: Diagnosis not present

## 2016-07-04 DIAGNOSIS — K921 Melena: Secondary | ICD-10-CM

## 2016-07-08 ENCOUNTER — Encounter (HOSPITAL_COMMUNITY): Payer: Self-pay | Admitting: Internal Medicine

## 2016-07-10 ENCOUNTER — Encounter (INDEPENDENT_AMBULATORY_CARE_PROVIDER_SITE_OTHER): Payer: Self-pay | Admitting: *Deleted

## 2016-07-10 ENCOUNTER — Other Ambulatory Visit (INDEPENDENT_AMBULATORY_CARE_PROVIDER_SITE_OTHER): Payer: Self-pay | Admitting: *Deleted

## 2016-07-10 DIAGNOSIS — K921 Melena: Secondary | ICD-10-CM

## 2016-07-17 NOTE — Progress Notes (Signed)
Personally have participated in and made any corrections needed to history, physical, neuro exam,assessment and plan as stated above.    Odester Nilson, MD [facility] 

## 2016-07-22 DIAGNOSIS — H35043 Retinal micro-aneurysms, unspecified, bilateral: Secondary | ICD-10-CM | POA: Diagnosis not present

## 2016-07-22 DIAGNOSIS — H353134 Nonexudative age-related macular degeneration, bilateral, advanced atrophic with subfoveal involvement: Secondary | ICD-10-CM | POA: Diagnosis not present

## 2016-07-22 DIAGNOSIS — H43811 Vitreous degeneration, right eye: Secondary | ICD-10-CM | POA: Diagnosis not present

## 2016-07-22 DIAGNOSIS — E113553 Type 2 diabetes mellitus with stable proliferative diabetic retinopathy, bilateral: Secondary | ICD-10-CM | POA: Diagnosis not present

## 2016-08-04 DIAGNOSIS — B351 Tinea unguium: Secondary | ICD-10-CM | POA: Diagnosis not present

## 2016-08-04 DIAGNOSIS — E1140 Type 2 diabetes mellitus with diabetic neuropathy, unspecified: Secondary | ICD-10-CM | POA: Diagnosis not present

## 2016-08-04 DIAGNOSIS — L110 Acquired keratosis follicularis: Secondary | ICD-10-CM | POA: Diagnosis not present

## 2016-08-04 DIAGNOSIS — E1151 Type 2 diabetes mellitus with diabetic peripheral angiopathy without gangrene: Secondary | ICD-10-CM | POA: Diagnosis not present

## 2016-08-06 DIAGNOSIS — N184 Chronic kidney disease, stage 4 (severe): Secondary | ICD-10-CM | POA: Diagnosis not present

## 2016-08-06 DIAGNOSIS — I482 Chronic atrial fibrillation: Secondary | ICD-10-CM | POA: Diagnosis not present

## 2016-08-06 DIAGNOSIS — I10 Essential (primary) hypertension: Secondary | ICD-10-CM | POA: Diagnosis not present

## 2016-08-06 DIAGNOSIS — Z299 Encounter for prophylactic measures, unspecified: Secondary | ICD-10-CM | POA: Diagnosis not present

## 2016-08-06 DIAGNOSIS — E1129 Type 2 diabetes mellitus with other diabetic kidney complication: Secondary | ICD-10-CM | POA: Diagnosis not present

## 2016-08-12 ENCOUNTER — Ambulatory Visit: Payer: Medicare Other | Admitting: Neurology

## 2016-08-12 DIAGNOSIS — I10 Essential (primary) hypertension: Secondary | ICD-10-CM | POA: Diagnosis not present

## 2016-08-12 DIAGNOSIS — E119 Type 2 diabetes mellitus without complications: Secondary | ICD-10-CM | POA: Diagnosis not present

## 2016-08-12 DIAGNOSIS — E7800 Pure hypercholesterolemia, unspecified: Secondary | ICD-10-CM | POA: Diagnosis not present

## 2016-08-18 DIAGNOSIS — Z299 Encounter for prophylactic measures, unspecified: Secondary | ICD-10-CM | POA: Diagnosis not present

## 2016-08-18 DIAGNOSIS — R350 Frequency of micturition: Secondary | ICD-10-CM | POA: Diagnosis not present

## 2016-08-18 DIAGNOSIS — D229 Melanocytic nevi, unspecified: Secondary | ICD-10-CM | POA: Diagnosis not present

## 2016-08-18 DIAGNOSIS — Z6826 Body mass index (BMI) 26.0-26.9, adult: Secondary | ICD-10-CM | POA: Diagnosis not present

## 2016-08-18 DIAGNOSIS — N390 Urinary tract infection, site not specified: Secondary | ICD-10-CM | POA: Diagnosis not present

## 2016-09-02 DIAGNOSIS — Z299 Encounter for prophylactic measures, unspecified: Secondary | ICD-10-CM | POA: Diagnosis not present

## 2016-09-02 DIAGNOSIS — R350 Frequency of micturition: Secondary | ICD-10-CM | POA: Diagnosis not present

## 2016-09-02 DIAGNOSIS — K644 Residual hemorrhoidal skin tags: Secondary | ICD-10-CM | POA: Diagnosis not present

## 2016-09-02 DIAGNOSIS — R159 Full incontinence of feces: Secondary | ICD-10-CM | POA: Diagnosis not present

## 2016-09-02 DIAGNOSIS — I10 Essential (primary) hypertension: Secondary | ICD-10-CM | POA: Diagnosis not present

## 2016-09-09 ENCOUNTER — Ambulatory Visit (INDEPENDENT_AMBULATORY_CARE_PROVIDER_SITE_OTHER): Payer: Medicare Other | Admitting: Cardiology

## 2016-09-09 ENCOUNTER — Encounter: Payer: Self-pay | Admitting: Cardiology

## 2016-09-09 VITALS — BP 157/93 | HR 54 | Wt 150.0 lb

## 2016-09-09 DIAGNOSIS — I480 Paroxysmal atrial fibrillation: Secondary | ICD-10-CM | POA: Diagnosis not present

## 2016-09-09 DIAGNOSIS — R001 Bradycardia, unspecified: Secondary | ICD-10-CM

## 2016-09-09 DIAGNOSIS — I2510 Atherosclerotic heart disease of native coronary artery without angina pectoris: Secondary | ICD-10-CM

## 2016-09-09 DIAGNOSIS — I10 Essential (primary) hypertension: Secondary | ICD-10-CM

## 2016-09-09 DIAGNOSIS — I639 Cerebral infarction, unspecified: Secondary | ICD-10-CM | POA: Diagnosis not present

## 2016-09-09 DIAGNOSIS — Z7901 Long term (current) use of anticoagulants: Secondary | ICD-10-CM

## 2016-09-09 MED ORDER — AMLODIPINE BESYLATE 5 MG PO TABS: 5.0000 mg | ORAL_TABLET | Freq: Every day | ORAL | 3 refills | Status: DC

## 2016-09-09 MED ORDER — APIXABAN 2.5 MG PO TABS: 2.5000 mg | ORAL_TABLET | Freq: Two times a day (BID) | ORAL | 3 refills | Status: DC

## 2016-09-09 NOTE — Patient Instructions (Addendum)
Medication Instructions:   Eliquis & Norvasc refilled today - sent to mail order.  Continue all other medications.    Labwork:  BMET, CBC - orders given today.  Office will contact with results via phone or letter.    Testing/Procedures: none  Follow-Up: Your physician wants you to follow up in: 6 months.  You will receive a reminder letter in the mail one-two months in advance.  If you don't receive a letter, please call our office to schedule the follow up appointment   Any Other Special Instructions Will Be Listed Below (If Applicable).  If you need a refill on your cardiac medications before your next appointment, please call your pharmacy.

## 2016-09-09 NOTE — Progress Notes (Signed)
Cardiology Office Note  Date: 09/09/2016   ID: Laura Parks, DOB 11-Jan-1930, MRN [id number]  PCP: Glenda Chroman, MD  Primary Cardiologist: Rozann Lesches, MD   Chief Complaint  Patient presents with  . PAF    History of Present Illness: Laura Parks is an 82 y.o. female last seen in September.She presents for a routine follow-up visit with her daughter. Overall no reported change since I last saw her. She does not report any palpitations, continues on Eliquis without bleeding problems. She is due for follow-up lab work.  At the last visit we stopped Toprol-XL and light of bradycardia and concurrent use of Aricept. Norvasc was started for additional blood pressure control. She has had no dizziness or syncope.  She reports no chest pain with typical ADLs. She does some knee been exercises.  Past Medical History:  Diagnosis Date  . Anemia   . Cognitive impairment   . Colon cancer ([place])   . Coronary artery disease    Minimal coronary atherosclerosis at cardiac catheterization April 2017  . Essential hypertension, benign   . History of pneumonia   . Iron deficiency anemia    Erosive antral gastritis  . Legal blindness   . Macular degeneration   . Mixed hyperlipidemia   . PAF (paroxysmal atrial fibrillation) ([place])   . Stroke [facility]) 01/2016   [place] infract  . Type 2 diabetes mellitus ([place])     Past Surgical History:  Procedure Laterality Date  . APPENDECTOMY    . CARDIAC CATHETERIZATION N/A 01/04/2016   Procedure: Left Heart Cath and Coronary Angiography;  Surgeon: Leonie Man, MD;  Location: [facility];  Service: Cardiovascular;  Laterality: N/A;  . CATARACT EXTRACTION    . CHOLECYSTECTOMY    . Colon surgery for colon cancer     1992 by Dr Mendel Ryder  . COLONOSCOPY  03/24/2012   Procedure: COLONOSCOPY;  Surgeon: Rogene Houston, MD;  [facility];  Service: Endoscopy;  Laterality: N/A;  1200  . COLONOSCOPY N/A 07/03/2016   Procedure:  COLONOSCOPY;  Surgeon: Rogene Houston, MD;  [facility];  Service: Endoscopy;  Laterality: N/A;  200  . ESOPHAGOGASTRODUODENOSCOPY  08/04/2012   Procedure: ESOPHAGOGASTRODUODENOSCOPY (EGD);  Surgeon: Rogene Houston, MD;  [facility];  Service: Endoscopy;  Laterality: N/A;  325  . Right knee surgery    . TEE WITHOUT CARDIOVERSION N/A 02/01/2016   Procedure: TRANSESOPHAGEAL ECHOCARDIOGRAM (TEE);  Surgeon: Jerline Pain, MD;  Location: [facility];  Service: Cardiovascular;  Laterality: N/A;  . TUBAL LIGATION      Current Outpatient Prescriptions  Medication Sig Dispense Refill  . acetaminophen (TYLENOL) 500 MG tablet Take 500 mg by mouth as needed.    Marland Kitchen alendronate (FOSAMAX) 70 MG tablet Take 70 mg by mouth every 7 (seven) days. Take with a full glass of water on an empty stomach. - on Fridays    . amLODipine (NORVASC) 5 MG tablet Take 1 tablet (5 mg total) by mouth daily. 90 tablet 3  . apixaban (ELIQUIS) 2.5 MG TABS tablet Take 1 tablet (2.5 mg total) by mouth 2 (two) times daily. 180 tablet 3  . atorvastatin (LIPITOR) 10 MG tablet Take 10 mg by mouth every evening.     . cholestyramine (QUESTRAN) 4 GM/DOSE powder Take 4 g by mouth as needed.     . citalopram (CELEXA) 20 MG tablet Take 1 tablet (20 mg total) by mouth daily. 30 tablet  0  . Cyanocobalamin (VITAMIN B-12) 2500 MCG SUBL Place 2,500 mcg under the tongue daily.     Marland Kitchen donepezil (ARICEPT) 10 MG tablet Take 1 tablet by mouth at bedtime.     . ergocalciferol (VITAMIN D2) 50000 units capsule Take 50,000 Units by mouth once a week.    . ferrous sulfate 325 (65 FE) MG tablet Take 325 mg by mouth daily with breakfast.    . meclizine (ANTIVERT) 25 MG tablet Take 25 mg by mouth 3 (three) times daily as needed. dizziness    . mirtazapine (REMERON) 15 MG tablet Take 15 mg by mouth at bedtime.    . ondansetron (ZOFRAN-ODT) 4 MG disintegrating tablet Take 4 mg by mouth every 6 (six) hours as needed. Nausea and vomiting     . pioglitazone (ACTOS) 15 MG tablet Take 15 mg by mouth daily.    . Sennosides (SENOKOT PO) Take 1 tablet by mouth as needed.      No current facility-administered medications for this visit.    Allergies:  Codeine   Social History: The patient  reports that she has never smoked. She has never used smokeless tobacco. She reports that she does not drink alcohol or use drugs.   ROS:  Please see the history of present illness. Otherwise, complete review of systems is positive for memory deficits.  All other systems are reviewed and negative.   Physical Exam: VS:  BP (!) 157/93   Pulse (!) 54   Wt 150 lb (68 kg)   SpO2 99%   BMI 27.44 kg/m , BMI Body mass index is 27.44 kg/m.  Wt Readings from Last 3 Encounters:  09/09/16 150 lb (68 kg)  07/03/16 140 lb (63.5 kg)  06/10/16 144 lb 8 oz (65.5 kg)    Elderly woman, appears comfortable at rest. HEENT: Conjunctiva and lids normal, oropharynx clear. Neck: Supple, no elevated JVP, soft right carotid bruit, no thyromegaly. Lungs: Clear to auscultation, nonlabored breathing at rest. Cardiac: Regular rate and rhythm with ectopy, no S3, soft systolic murmur, no pericardial rub. Abdomen: Soft, nontender, bowel sounds present, no guarding or rebound. Extremities: No pitting edema, distal pulses 2+. Skin: Warm and dry. Musculoskeletal: No kyphosis. Neuropsychiatric: Alert and oriented 3, affect appropriate.  ECG: I personally reviewed the tracing from 01/30/2016 which showed sinus bradycardia with PVCs, left anterior fascicular block, right bundle branch block.  Recent Labwork: 12/26/2015: B Natriuretic Peptide 252.5 01/30/2016: ALT 12; AST 21; Magnesium 1.8 02/02/2016: BUN 21; Creatinine, Ser 1.30; Platelets 143; Potassium 4.3; Sodium 141 02/06/2016: TSH 4.140 07/03/2016: Hemoglobin 9.9     Component Value Date/Time   CHOL 130 01/31/2016 0423   TRIG 46 01/31/2016 0423   HDL 49 01/31/2016 0423   CHOLHDL 2.7 01/31/2016 0423   VLDL 9  01/31/2016 0423   LDLCALC 72 01/31/2016 0423    Other Studies Reviewed Today:  Cardiac catheterization 01/04/2016: 1. Prox RCA lesion, 20% stenosed. 2. Ost Ramus to Ramus lesion, 40% stenosed. 3. Elevated LVEDP, 27 mmHg  Angiographically minimal coronary disease. No culprit lesion to explain significant elevation in troponin.  Transesophageal echocardiogram 02/01/2016: Study Conclusions  - Left ventricle: Systolic function was normal. The estimated  ejection fraction was in the range of 60% to 65%. Wall motion was  normal; there were no regional wall motion abnormalities. - Aortic valve: Trileaflet; normal thickness, mildly calcified  leaflets. - Mitral valve: Mildly calcified annulus. There was mild  regurgitation. - Left atrium: No evidence of thrombus in the atrial cavity  or  appendage. No evidence of thrombus in the atrial cavity or  appendage. - Right atrium: No evidence of thrombus in the atrial cavity or  appendage. - Atrial septum: No defect or patent foramen ovale was identified.  Echo contrast study showed no right-to-left atrial level shunt,  following an increase in RA pressure induced by provocative  maneuvers.  Assessment and Plan:  1. Paroxysmal atrial fibrillation. Continue observation on Eliquis. She is not on any AV nodal blockers at this time in light of bradycardia. Due for follow-up CBC and BMET.  2. Bradycardia with suspected conduction system disease. We discontinued beta blocker previously. She is also on Aricept. No dizziness or syncope.  3. History of minor coronary atherosclerosis, no active angina symptoms.  4. Essential hypertension, tolerating Norvasc which will be refilled.  Current medicines were reviewed with the patient today.   Orders Placed This Encounter  Procedures  . Basic metabolic panel  . CBC    Disposition: Follow-up in 6 months.  Signed, Satira Sark, MD, [facility] 09/09/2016 1:32 PM    [facility] [place], [place], [place] [address] Phone: [phone]; Fax: [phone]

## 2016-09-11 ENCOUNTER — Telehealth: Payer: Self-pay | Admitting: *Deleted

## 2016-09-11 NOTE — Telephone Encounter (Signed)
Patient informed and copy sent to PCP. 

## 2016-09-11 NOTE — Telephone Encounter (Signed)
-----   Message from Satira Sark, MD sent at 09/10/2016  1:58 PM EST ----- Results reviewed. Hemoglobin is stable at 10.6, creatinine also stable at 1.3. Continue with current medications. A copy of this test should be forwarded to Glenda Chroman, MD.

## 2016-09-12 DIAGNOSIS — I10 Essential (primary) hypertension: Secondary | ICD-10-CM | POA: Diagnosis not present

## 2016-09-12 DIAGNOSIS — E7800 Pure hypercholesterolemia, unspecified: Secondary | ICD-10-CM | POA: Diagnosis not present

## 2016-09-12 DIAGNOSIS — E119 Type 2 diabetes mellitus without complications: Secondary | ICD-10-CM | POA: Diagnosis not present

## 2016-10-13 DIAGNOSIS — E1140 Type 2 diabetes mellitus with diabetic neuropathy, unspecified: Secondary | ICD-10-CM | POA: Diagnosis not present

## 2016-10-13 DIAGNOSIS — E1151 Type 2 diabetes mellitus with diabetic peripheral angiopathy without gangrene: Secondary | ICD-10-CM | POA: Diagnosis not present

## 2016-10-13 DIAGNOSIS — B351 Tinea unguium: Secondary | ICD-10-CM | POA: Diagnosis not present

## 2016-10-13 DIAGNOSIS — L110 Acquired keratosis follicularis: Secondary | ICD-10-CM | POA: Diagnosis not present

## 2016-10-14 DIAGNOSIS — I10 Essential (primary) hypertension: Secondary | ICD-10-CM | POA: Diagnosis not present

## 2016-10-14 DIAGNOSIS — E7800 Pure hypercholesterolemia, unspecified: Secondary | ICD-10-CM | POA: Diagnosis not present

## 2016-10-14 DIAGNOSIS — E119 Type 2 diabetes mellitus without complications: Secondary | ICD-10-CM | POA: Diagnosis not present

## 2016-10-24 DIAGNOSIS — I10 Essential (primary) hypertension: Secondary | ICD-10-CM | POA: Diagnosis not present

## 2016-10-24 DIAGNOSIS — I6529 Occlusion and stenosis of unspecified carotid artery: Secondary | ICD-10-CM | POA: Diagnosis not present

## 2016-10-24 DIAGNOSIS — I482 Chronic atrial fibrillation: Secondary | ICD-10-CM | POA: Diagnosis not present

## 2016-10-24 DIAGNOSIS — Z299 Encounter for prophylactic measures, unspecified: Secondary | ICD-10-CM | POA: Diagnosis not present

## 2016-10-24 DIAGNOSIS — N184 Chronic kidney disease, stage 4 (severe): Secondary | ICD-10-CM | POA: Diagnosis not present

## 2016-10-24 DIAGNOSIS — E1122 Type 2 diabetes mellitus with diabetic chronic kidney disease: Secondary | ICD-10-CM | POA: Diagnosis not present

## 2016-11-05 DIAGNOSIS — I10 Essential (primary) hypertension: Secondary | ICD-10-CM | POA: Diagnosis not present

## 2016-11-05 DIAGNOSIS — E7800 Pure hypercholesterolemia, unspecified: Secondary | ICD-10-CM | POA: Diagnosis not present

## 2016-11-05 DIAGNOSIS — E119 Type 2 diabetes mellitus without complications: Secondary | ICD-10-CM | POA: Diagnosis not present

## 2016-11-18 DIAGNOSIS — Z6828 Body mass index (BMI) 28.0-28.9, adult: Secondary | ICD-10-CM | POA: Diagnosis not present

## 2016-11-18 DIAGNOSIS — E1129 Type 2 diabetes mellitus with other diabetic kidney complication: Secondary | ICD-10-CM | POA: Diagnosis not present

## 2016-11-18 DIAGNOSIS — I10 Essential (primary) hypertension: Secondary | ICD-10-CM | POA: Diagnosis not present

## 2016-11-18 DIAGNOSIS — Z299 Encounter for prophylactic measures, unspecified: Secondary | ICD-10-CM | POA: Diagnosis not present

## 2016-11-18 DIAGNOSIS — R600 Localized edema: Secondary | ICD-10-CM | POA: Diagnosis not present

## 2016-11-18 DIAGNOSIS — Z713 Dietary counseling and surveillance: Secondary | ICD-10-CM | POA: Diagnosis not present

## 2016-11-18 DIAGNOSIS — F329 Major depressive disorder, single episode, unspecified: Secondary | ICD-10-CM | POA: Diagnosis not present

## 2016-12-08 ENCOUNTER — Encounter: Payer: Self-pay | Admitting: Neurology

## 2016-12-08 ENCOUNTER — Ambulatory Visit (INDEPENDENT_AMBULATORY_CARE_PROVIDER_SITE_OTHER): Payer: Medicare Other | Admitting: Neurology

## 2016-12-08 VITALS — BP 143/71 | HR 59 | Ht 62.0 in | Wt 157.0 lb

## 2016-12-08 DIAGNOSIS — M545 Low back pain, unspecified: Secondary | ICD-10-CM

## 2016-12-08 DIAGNOSIS — Z9181 History of falling: Secondary | ICD-10-CM

## 2016-12-08 DIAGNOSIS — R531 Weakness: Secondary | ICD-10-CM

## 2016-12-08 DIAGNOSIS — F0390 Unspecified dementia without behavioral disturbance: Secondary | ICD-10-CM

## 2016-12-08 MED ORDER — DONEPEZIL HCL 10 MG PO TABS: 10.0000 mg | ORAL_TABLET | Freq: Every day | ORAL | 4 refills | Status: DC

## 2016-12-08 MED ORDER — CITALOPRAM HYDROBROMIDE 20 MG PO TABS: 20.0000 mg | ORAL_TABLET | Freq: Every day | ORAL | 4 refills | Status: DC

## 2016-12-08 MED ORDER — APIXABAN 2.5 MG PO TABS: 2.5000 mg | ORAL_TABLET | Freq: Two times a day (BID) | ORAL | 3 refills | Status: DC

## 2016-12-08 MED ORDER — ATORVASTATIN CALCIUM 10 MG PO TABS: 10.0000 mg | ORAL_TABLET | Freq: Every evening | ORAL | 4 refills | Status: DC

## 2016-12-08 MED ORDER — AMLODIPINE BESYLATE 5 MG PO TABS: 5.0000 mg | ORAL_TABLET | Freq: Every day | ORAL | 4 refills | Status: DC

## 2016-12-08 NOTE — Progress Notes (Signed)
[facility]    Provider:  Dr Jaynee Eagles Referring Provider: Glenda Chroman, MD Primary Care Physician:  Glenda Chroman, MD  CC: Dementia  HPI: Laura Parks is a 83 y.o. female here as a follow up for  dementia and embolic stroke due to afib on Eliquis. .   Interval history 12/08/2016: She is doing so much better. She goes out dancing. Everything is better. New problem, her back hurts. In the lower part. Doesn't shoot into her legs. No legs. She dances for 2 hours, they dance a lot and [facility] [employer] [place]. Back hurts in the morning. Standing for a long time also makes it hurt. Tylenol helps. It frequently gets bad. I would recommend PT. Will order home PT for patient.    Patient reports that she is doing well. She lives at home with her husband. She can complete all ADLs independently. She does not drive a motor vehicle due to macular degeneration. She does minimal cooking. She states typically her son or daughter will bring them food. She is on Aricept 10 mg daily. She reports that her mood has improved with Celexa and mirtazapine. She denies any depression. She does report that she has some trouble with fine motor skills in the right hand. She states this occurred after her strokes. She states that she noticed when she tries to replace her battery in her hearing aid she has trouble holding the battery. She returns today for an evaluation.   Interval history 02/06/2016: Neuropsychiatric testing 2 years ago showed that most of her symptoms were at that time likely due to pseudodementia secondary to severe depression.Since then she has had strokes and memory decline.  She had a recent admission 02/01/2016 for stroke. She has a questionable history of atrial fibrillation discovered 1 month ago, per history from her daughter. She was seen by cardiologist and it is unclear if she actually had a documented A. fib or not, per cardiologist. Clinically, patient did not  have any focal neurological symptoms, no new speech problems or focal sensory or motor symptoms. She does have poor visual activity at baseline and narrow visual field from macular degeneration which has been grossly unchanged. She was started on heparin drip at [facility] prior to [facility] this month. MRI of the brain showed Multiple bilateral anterior and posterior circulation infarcts, embolic secondary to cardiac source, highly suspicious for afib although has not confirmed yet, Multiple previous subacute infarcts, Extensive white matter disease in both hemispheres, Multiple remote lacunar infarcts are also noted, stable. She was started on eliquis.  She takes b12 every day (unclear). She is due for home PT, OT and a nurse and they will be coming out to the house 3x a week. Since the strokes in April her memory and confusion have increased. Her MMSe today is 9/27. She forgets the way to her bathroom in the house. She can't cook anymore. She gets confused doing anything. Her ability to do things has declined, she is now using a walker and wheelchair. She lives with husband and son and daughter is frequently there. Daughter provides al information today.   Interval History 08/31/2014: She is not eating. It is like she is committing suicide per daughter. Daughter is crying. Says mother is committing suicide slowly. Patient is anemic. Patient is dehydrated. She lost 18 pounds in 3 weeks. She will not eat. Her sodium levels are low. She feels hopeless. She is taking antidepressant every  day. No interest in anything. She always enjoyed reading and now she can't do that. Patient's husband is worried. Daughter provides most of the information. Reviewed MRI images that showed generalized atrophy congruent with age otherwise unremarkable. B12 194.   Review of Systems: Patient complains of symptoms per HPI as well as the following symptoms:Appetite change, weight loss, fatigue, loss of  vision, easy bruising, cough, murmur, swelling in legs, feeling cold, hearing loss, trouble swallowing, chronic diarrhea, memory loss, confusion, weakness, sleepiness, slurred speech, difficulty swallowing, dizziness, depression, anxiety, too much sleep, decreased energy, change in appetite, disinterest in activity, hallucinations, racing thoughts. Pertinent negatives per HPI. All others negative.  Inital visit 07/25/2014: Laura Parks is a 83 y.o. female here as a referral from Dr. Woody Seller for cognitive and behavioral changes. Daughter provides most of the information. 3 years ago noticed changes in her mother, she was losing her glasses and losing her purse and articles of clothing. It continued and progressed. Patient doesn't want to socially interact, doesn't want to go to church on Sundays and used to love to go dancing and won't go there either. She has stopped wanting to do anything. Daughter and son pay bills. She doesn't cook anymore, last night she tried and burned the food. She doesn't cook because she leaves burners on. This past summer daughter noticed worsening changes. In October patient became silent and distant, "in her own world". Then on October 19th patient asked daughter to come home and said it was going to be all right, patient thought the police were going to arrest her for brother's death and she is going to prison. Patient has been perseverating on going to jail since then and dieing in prison. Patient reports she did have something to do with her brother's death but doesn't know what she did. She says "I have no joy, no love" in my life. Patient agrees she is having memory problems, forgetting to turn the stove off. She is forgetting to feed the dog recently. Husband is also noticing the changes in memory and doesn't know what to do about it.   She feels sad most days, she has loss of interest in almost everything she used to do. She used to go shopping and doesn't want to do that  anymore. She endorses decreased energy, fatigue, irregular sleep, decreased appetite and decreased intake, "I simply do not want anymore to eat". Restless all day.   Daughter also reports her mother is slowing down. Balance is poor, no falls. But has lost her balance.   Mother with alzheimers.   Reviewed notes, labs and imaging from outside physicians, which showed: cbc 11/2013 with anemia. cmp unremarkable. hgba1c 5.6. ldl 63. tsh wnl. Carotid doppler study 02/2014 with evidence of 40-59% bilateral ICA stenosis. Echocardiogram done at [facility] in October 2014 reported normal LV wall thickness with LVEF 60-65%, mild left atrial enlargement, severe MAC with mildly thickened mitral valve and mild mitral regurgitation, moderately sclerotic aortic valve, mild tricuspid regurgitation. The patient has history of colon carcinoma. A review of notes in [facility] through 07/2012 (mainly cardiology and gastro) don't mention any apparent cognitive dysfunction. Reviewed primary care notes provided from [facility] internal, last OP note mentions that patient and granddaughter requesting a neuro referral for recent delusions about going to prison, changes in walking, leaving stove on, inability to do serial 7s and draw clock    Social History   Social History  . Marital status: Married    Spouse name: N/A  .  Number of children: 3  . Years of education: 24   Occupational History  . Retired    Social History Main Topics  . Smoking status: Never Smoker  . Smokeless tobacco: Never Used  . Alcohol use No  . Drug use: No  . Sexual activity: Not on file   Other Topics Concern  . Not on file   Social History Narrative   Lives at home w/ her husband and son   Right-handed   Caffeine: tea daily    Family History  Problem Relation Age of Onset  . COPD Mother   . Alzheimer's disease Mother   . Congestive Heart Failure Mother   . Diabetes Brother     Past Medical History:  Diagnosis Date  .  Anemia   . Cognitive impairment   . Colon cancer ([place])   . Coronary artery disease    Minimal coronary atherosclerosis at cardiac catheterization April 2017  . Essential hypertension, benign   . History of pneumonia   . Iron deficiency anemia    Erosive antral gastritis  . Legal blindness   . Macular degeneration   . Mixed hyperlipidemia   . PAF (paroxysmal atrial fibrillation) ([place])   . Stroke [facility]) 01/2016   [place] infract  . Type 2 diabetes mellitus ([place])     Past Surgical History:  Procedure Laterality Date  . APPENDECTOMY    . CARDIAC CATHETERIZATION N/A 01/04/2016   Procedure: Left Heart Cath and Coronary Angiography;  Surgeon: Leonie Man, MD;  Location: [facility];  Service: Cardiovascular;  Laterality: N/A;  . CATARACT EXTRACTION    . CHOLECYSTECTOMY    . Colon surgery for colon cancer     1992 by Dr Mendel Ryder  . COLONOSCOPY  03/24/2012   Procedure: COLONOSCOPY;  Surgeon: Rogene Houston, MD;  [facility];  Service: Endoscopy;  Laterality: N/A;  1200  . COLONOSCOPY N/A 07/03/2016   Procedure: COLONOSCOPY;  Surgeon: Rogene Houston, MD;  [facility];  Service: Endoscopy;  Laterality: N/A;  200  . ESOPHAGOGASTRODUODENOSCOPY  08/04/2012   Procedure: ESOPHAGOGASTRODUODENOSCOPY (EGD);  Surgeon: Rogene Houston, MD;  [facility];  Service: Endoscopy;  Laterality: N/A;  325  . Right knee surgery    . TEE WITHOUT CARDIOVERSION N/A 02/01/2016   Procedure: TRANSESOPHAGEAL ECHOCARDIOGRAM (TEE);  Surgeon: Jerline Pain, MD;  Location: [facility];  Service: Cardiovascular;  Laterality: N/A;  . TUBAL LIGATION      Current Outpatient Prescriptions  Medication Sig Dispense Refill  . acetaminophen (TYLENOL) 500 MG tablet Take 500 mg by mouth as needed.    Marland Kitchen alendronate (FOSAMAX) 70 MG tablet Take 70 mg by mouth every 7 (seven) days. Take with a full glass of water on an empty stomach. - on Fridays    . amLODipine (NORVASC) 5 MG tablet  Take 1 tablet (5 mg total) by mouth daily. 90 tablet 3  . apixaban (ELIQUIS) 2.5 MG TABS tablet Take 1 tablet (2.5 mg total) by mouth 2 (two) times daily. 180 tablet 3  . atorvastatin (LIPITOR) 10 MG tablet Take 10 mg by mouth every evening.     . cholestyramine (QUESTRAN) 4 GM/DOSE powder Take 4 g by mouth as needed.     . citalopram (CELEXA) 20 MG tablet Take 1 tablet (20 mg total) by mouth daily. 30 tablet 0  . Cyanocobalamin (VITAMIN B-12) 2500 MCG SUBL Place 2,500 mcg under the tongue daily.     Marland Kitchen  donepezil (ARICEPT) 10 MG tablet Take 1 tablet by mouth at bedtime.     . ergocalciferol (VITAMIN D2) 50000 units capsule Take 50,000 Units by mouth once a week.    . ferrous sulfate 325 (65 FE) MG tablet Take 325 mg by mouth daily with breakfast.    . meclizine (ANTIVERT) 25 MG tablet Take 25 mg by mouth 3 (three) times daily as needed. dizziness    . mirtazapine (REMERON) 15 MG tablet Take 15 mg by mouth at bedtime.    . ondansetron (ZOFRAN-ODT) 4 MG disintegrating tablet Take 4 mg by mouth every 6 (six) hours as needed. Nausea and vomiting    . pioglitazone (ACTOS) 15 MG tablet Take 15 mg by mouth daily.    . Sennosides (SENOKOT PO) Take 1 tablet by mouth as needed.      No current facility-administered medications for this visit.     Allergies as of 12/08/2016 - Review Complete 12/08/2016  Allergen Reaction Noted  . Codeine Nausea Only 02/23/2012    Vitals: Ht _0  (1.575 m)   Wt 157 lb (71.2 kg)   BMI 28.72 kg/m  Last Weight:  Wt Readings from Last 1 Encounters:  12/08/16 157 lb (71.2 kg)   Last Height:   Ht Readings from Last 1 Encounters:  12/08/16 _1  (1.575 m)    MMSE - Mini Mental State Exam 12/08/2016 06/09/2016 06/09/2016  Not completed: - - -  Orientation to time 5 - 3  Orientation to Place 4 - 4  Registration 3 - 3  Attention/ Calculation 2 - 1  Recall 2 - 2  Language- name 2 objects 2 - 2  Language- repeat 1 - 1  Language- follow 3 step command 2 - 3    Language- read & follow direction 1 (No Data) 0  Language-read & follow direction-comments - UTA d/t macular degeneration -  Write a sentence 1 (No Data) 0  Write a sentence-comments - UTA d/t macular degeneration -  Copy design 0 (No Data) 0  Copy design-comments - UTA d/t macular degeneration -  Total score 23 - 19   Cranial Nerves:   The pupils are equal, round, and reactive to light. Deceased vision (legally blind due to macular degeneration). The fundi are flat. Visual fields are full to finger waving. Extraocular movements are intact. Trigeminal sensation is intact and the muscles of mastication are normal. The face is symmetric. The palate elevates in the midline. Voice is normal. Shoulder shrug is normal. The tongue has normal motion without fasciculations.   Coordination:  Normal finger to nose. Difficulty heel to shin due to body habitus. Slowed rapid alternating movements bilat.  Gait:  Mildly wide-based but small stride, decreased arm swing bilat, en-bloc turning, using a walker  Motor Observation:   Masked facies. No tremor. Tone:  Increased tone with facilitation in the upper extremities  Posture:  Posture is normal.    Strength:  Strength is intact in the upper and lower limbs.    Sensation:   Intact light touch Reflex Exam:  DTR's:  Absent achilles otherwise deep tendon reflexes in the upper and lower extremities are normal bilaterally.  Toes:  Right down. Left mute. Clonus:  Clonus is absent.  Other: No frontal release signs.       Assessment/Plan: Laura Parks is a 83 y.o. female with history of recent strokes and dementia.   Stroke: Multiple bilateral anterior and posterior circulation infarcts, embolic secondary to cardiac source, highly suspicious  for afib although has not confirmed yet.    Continue eliquis for stroke prevention  Patient counseled to be compliant with her antithrombotic  medications  Ongoing aggressive stroke risk factor management  Monitor HTN, follow with pcp  Continue statin  HgbA1c 6.9, at goal < 7.0  Refilled medications   Dementia:  VB 12 deficiency: Takes B12, last much improved  Continue Aricept  MMSE 24/30 discussed namenda and they want to hold off for now  Back pain:  Home PT for nursing as well as physical therapy for stretching and strengthening      Sarina Ill, MD  [facility] [address] [place], [place] [address]  Phone [phone] Fax [phone]  A total of 54mnutes was spent face-to-face with this patient. Over half this time was spent on counseling patient on the stroke,dementia,low back pain diagnosis and different diagnostic and therapeutic options available.

## 2016-12-08 NOTE — Patient Instructions (Addendum)
Remember to drink plenty of fluid, eat healthy meals and do not skip any meals. Try to eat protein with a every meal and eat a healthy snack such as fruit or nuts in between meals. Try to keep a regular sleep-wake schedule and try to exercise daily, particularly in the form of walking, 20-30 minutes a day, if you can.   As far as your medications are concerned, I would like to suggest: Continue Aricept  As far as diagnostic testing: Physical Therapy  I would like to see you back in 9 months, sooner if we need to. Please call us with any interim questions, concerns, problems, updates or refill requests.   Our phone number is [phone]. We also have an after hours call service for urgent matters and there is a physician on-call for urgent questions. For any emergencies you know to call 911 or go to the nearest emergency room

## 2016-12-12 DIAGNOSIS — M545 Low back pain: Secondary | ICD-10-CM | POA: Diagnosis not present

## 2016-12-12 DIAGNOSIS — E538 Deficiency of other specified B group vitamins: Secondary | ICD-10-CM | POA: Diagnosis not present

## 2016-12-16 DIAGNOSIS — M545 Low back pain: Secondary | ICD-10-CM | POA: Diagnosis not present

## 2016-12-16 DIAGNOSIS — E538 Deficiency of other specified B group vitamins: Secondary | ICD-10-CM | POA: Diagnosis not present

## 2016-12-17 DIAGNOSIS — E538 Deficiency of other specified B group vitamins: Secondary | ICD-10-CM | POA: Diagnosis not present

## 2016-12-17 DIAGNOSIS — M545 Low back pain: Secondary | ICD-10-CM | POA: Diagnosis not present

## 2016-12-18 DIAGNOSIS — M545 Low back pain: Secondary | ICD-10-CM | POA: Diagnosis not present

## 2016-12-18 DIAGNOSIS — E538 Deficiency of other specified B group vitamins: Secondary | ICD-10-CM | POA: Diagnosis not present

## 2016-12-23 DIAGNOSIS — E538 Deficiency of other specified B group vitamins: Secondary | ICD-10-CM | POA: Diagnosis not present

## 2016-12-23 DIAGNOSIS — M545 Low back pain: Secondary | ICD-10-CM | POA: Diagnosis not present

## 2016-12-24 DIAGNOSIS — M545 Low back pain: Secondary | ICD-10-CM | POA: Diagnosis not present

## 2016-12-24 DIAGNOSIS — E538 Deficiency of other specified B group vitamins: Secondary | ICD-10-CM | POA: Diagnosis not present

## 2016-12-25 DIAGNOSIS — M545 Low back pain: Secondary | ICD-10-CM | POA: Diagnosis not present

## 2016-12-25 DIAGNOSIS — E538 Deficiency of other specified B group vitamins: Secondary | ICD-10-CM | POA: Diagnosis not present

## 2016-12-29 DIAGNOSIS — E1151 Type 2 diabetes mellitus with diabetic peripheral angiopathy without gangrene: Secondary | ICD-10-CM | POA: Diagnosis not present

## 2016-12-29 DIAGNOSIS — B351 Tinea unguium: Secondary | ICD-10-CM | POA: Diagnosis not present

## 2016-12-29 DIAGNOSIS — E1140 Type 2 diabetes mellitus with diabetic neuropathy, unspecified: Secondary | ICD-10-CM | POA: Diagnosis not present

## 2016-12-29 DIAGNOSIS — L110 Acquired keratosis follicularis: Secondary | ICD-10-CM | POA: Diagnosis not present

## 2017-01-01 DIAGNOSIS — M545 Low back pain: Secondary | ICD-10-CM | POA: Diagnosis not present

## 2017-01-01 DIAGNOSIS — E538 Deficiency of other specified B group vitamins: Secondary | ICD-10-CM | POA: Diagnosis not present

## 2017-01-13 DIAGNOSIS — I10 Essential (primary) hypertension: Secondary | ICD-10-CM | POA: Diagnosis not present

## 2017-01-13 DIAGNOSIS — E1122 Type 2 diabetes mellitus with diabetic chronic kidney disease: Secondary | ICD-10-CM | POA: Diagnosis not present

## 2017-01-13 DIAGNOSIS — Z6829 Body mass index (BMI) 29.0-29.9, adult: Secondary | ICD-10-CM | POA: Diagnosis not present

## 2017-01-13 DIAGNOSIS — E1165 Type 2 diabetes mellitus with hyperglycemia: Secondary | ICD-10-CM | POA: Diagnosis not present

## 2017-01-13 DIAGNOSIS — I6529 Occlusion and stenosis of unspecified carotid artery: Secondary | ICD-10-CM | POA: Diagnosis not present

## 2017-01-13 DIAGNOSIS — E1142 Type 2 diabetes mellitus with diabetic polyneuropathy: Secondary | ICD-10-CM | POA: Diagnosis not present

## 2017-01-13 DIAGNOSIS — F329 Major depressive disorder, single episode, unspecified: Secondary | ICD-10-CM | POA: Diagnosis not present

## 2017-01-13 DIAGNOSIS — K529 Noninfective gastroenteritis and colitis, unspecified: Secondary | ICD-10-CM | POA: Diagnosis not present

## 2017-01-13 DIAGNOSIS — N184 Chronic kidney disease, stage 4 (severe): Secondary | ICD-10-CM | POA: Diagnosis not present

## 2017-01-13 DIAGNOSIS — I639 Cerebral infarction, unspecified: Secondary | ICD-10-CM | POA: Diagnosis not present

## 2017-01-13 DIAGNOSIS — Z299 Encounter for prophylactic measures, unspecified: Secondary | ICD-10-CM | POA: Diagnosis not present

## 2017-01-13 DIAGNOSIS — I482 Chronic atrial fibrillation: Secondary | ICD-10-CM | POA: Diagnosis not present

## 2017-01-16 DIAGNOSIS — E7800 Pure hypercholesterolemia, unspecified: Secondary | ICD-10-CM | POA: Diagnosis not present

## 2017-01-16 DIAGNOSIS — I10 Essential (primary) hypertension: Secondary | ICD-10-CM | POA: Diagnosis not present

## 2017-01-16 DIAGNOSIS — E119 Type 2 diabetes mellitus without complications: Secondary | ICD-10-CM | POA: Diagnosis not present

## 2017-01-27 DIAGNOSIS — I482 Chronic atrial fibrillation: Secondary | ICD-10-CM | POA: Diagnosis not present

## 2017-01-27 DIAGNOSIS — E7800 Pure hypercholesterolemia, unspecified: Secondary | ICD-10-CM | POA: Diagnosis not present

## 2017-01-27 DIAGNOSIS — N184 Chronic kidney disease, stage 4 (severe): Secondary | ICD-10-CM | POA: Diagnosis not present

## 2017-01-27 DIAGNOSIS — M810 Age-related osteoporosis without current pathological fracture: Secondary | ICD-10-CM | POA: Diagnosis not present

## 2017-01-27 DIAGNOSIS — H3530 Unspecified macular degeneration: Secondary | ICD-10-CM | POA: Diagnosis not present

## 2017-01-27 DIAGNOSIS — F329 Major depressive disorder, single episode, unspecified: Secondary | ICD-10-CM | POA: Diagnosis not present

## 2017-01-27 DIAGNOSIS — E1142 Type 2 diabetes mellitus with diabetic polyneuropathy: Secondary | ICD-10-CM | POA: Diagnosis not present

## 2017-01-27 DIAGNOSIS — I10 Essential (primary) hypertension: Secondary | ICD-10-CM | POA: Diagnosis not present

## 2017-01-27 DIAGNOSIS — Z6828 Body mass index (BMI) 28.0-28.9, adult: Secondary | ICD-10-CM | POA: Diagnosis not present

## 2017-01-27 DIAGNOSIS — I639 Cerebral infarction, unspecified: Secondary | ICD-10-CM | POA: Diagnosis not present

## 2017-01-27 DIAGNOSIS — Z299 Encounter for prophylactic measures, unspecified: Secondary | ICD-10-CM | POA: Diagnosis not present

## 2017-01-27 DIAGNOSIS — E1165 Type 2 diabetes mellitus with hyperglycemia: Secondary | ICD-10-CM | POA: Diagnosis not present

## 2017-03-06 DIAGNOSIS — E7800 Pure hypercholesterolemia, unspecified: Secondary | ICD-10-CM | POA: Diagnosis not present

## 2017-03-06 DIAGNOSIS — E119 Type 2 diabetes mellitus without complications: Secondary | ICD-10-CM | POA: Diagnosis not present

## 2017-03-06 DIAGNOSIS — I10 Essential (primary) hypertension: Secondary | ICD-10-CM | POA: Diagnosis not present

## 2017-03-19 DIAGNOSIS — Z1231 Encounter for screening mammogram for malignant neoplasm of breast: Secondary | ICD-10-CM | POA: Diagnosis not present

## 2017-03-20 DIAGNOSIS — Z299 Encounter for prophylactic measures, unspecified: Secondary | ICD-10-CM | POA: Diagnosis not present

## 2017-03-20 DIAGNOSIS — Z789 Other specified health status: Secondary | ICD-10-CM | POA: Diagnosis not present

## 2017-03-20 DIAGNOSIS — I482 Chronic atrial fibrillation: Secondary | ICD-10-CM | POA: Diagnosis not present

## 2017-03-20 DIAGNOSIS — I10 Essential (primary) hypertension: Secondary | ICD-10-CM | POA: Diagnosis not present

## 2017-03-20 DIAGNOSIS — I639 Cerebral infarction, unspecified: Secondary | ICD-10-CM | POA: Diagnosis not present

## 2017-03-20 DIAGNOSIS — S51812A Laceration without foreign body of left forearm, initial encounter: Secondary | ICD-10-CM | POA: Diagnosis not present

## 2017-03-26 DIAGNOSIS — E7800 Pure hypercholesterolemia, unspecified: Secondary | ICD-10-CM | POA: Diagnosis not present

## 2017-03-26 DIAGNOSIS — I10 Essential (primary) hypertension: Secondary | ICD-10-CM | POA: Diagnosis not present

## 2017-03-26 DIAGNOSIS — E119 Type 2 diabetes mellitus without complications: Secondary | ICD-10-CM | POA: Diagnosis not present

## 2017-04-15 DIAGNOSIS — N184 Chronic kidney disease, stage 4 (severe): Secondary | ICD-10-CM | POA: Diagnosis not present

## 2017-04-15 DIAGNOSIS — E1122 Type 2 diabetes mellitus with diabetic chronic kidney disease: Secondary | ICD-10-CM | POA: Diagnosis not present

## 2017-04-15 DIAGNOSIS — M810 Age-related osteoporosis without current pathological fracture: Secondary | ICD-10-CM | POA: Diagnosis not present

## 2017-04-15 DIAGNOSIS — F418 Other specified anxiety disorders: Secondary | ICD-10-CM | POA: Diagnosis not present

## 2017-04-15 DIAGNOSIS — E663 Overweight: Secondary | ICD-10-CM | POA: Diagnosis not present

## 2017-04-15 DIAGNOSIS — Z299 Encounter for prophylactic measures, unspecified: Secondary | ICD-10-CM | POA: Diagnosis not present

## 2017-04-15 DIAGNOSIS — F329 Major depressive disorder, single episode, unspecified: Secondary | ICD-10-CM | POA: Diagnosis not present

## 2017-04-15 DIAGNOSIS — I482 Chronic atrial fibrillation: Secondary | ICD-10-CM | POA: Diagnosis not present

## 2017-04-15 DIAGNOSIS — E7800 Pure hypercholesterolemia, unspecified: Secondary | ICD-10-CM | POA: Diagnosis not present

## 2017-04-15 DIAGNOSIS — H3530 Unspecified macular degeneration: Secondary | ICD-10-CM | POA: Diagnosis not present

## 2017-04-15 DIAGNOSIS — I6529 Occlusion and stenosis of unspecified carotid artery: Secondary | ICD-10-CM | POA: Diagnosis not present

## 2017-04-15 DIAGNOSIS — I10 Essential (primary) hypertension: Secondary | ICD-10-CM | POA: Diagnosis not present

## 2017-04-28 DIAGNOSIS — I10 Essential (primary) hypertension: Secondary | ICD-10-CM | POA: Diagnosis not present

## 2017-04-28 DIAGNOSIS — K644 Residual hemorrhoidal skin tags: Secondary | ICD-10-CM | POA: Diagnosis not present

## 2017-04-28 DIAGNOSIS — E1142 Type 2 diabetes mellitus with diabetic polyneuropathy: Secondary | ICD-10-CM | POA: Diagnosis not present

## 2017-04-28 DIAGNOSIS — E1165 Type 2 diabetes mellitus with hyperglycemia: Secondary | ICD-10-CM | POA: Diagnosis not present

## 2017-04-28 DIAGNOSIS — F329 Major depressive disorder, single episode, unspecified: Secondary | ICD-10-CM | POA: Diagnosis not present

## 2017-04-28 DIAGNOSIS — N184 Chronic kidney disease, stage 4 (severe): Secondary | ICD-10-CM | POA: Diagnosis not present

## 2017-04-28 DIAGNOSIS — E663 Overweight: Secondary | ICD-10-CM | POA: Diagnosis not present

## 2017-04-28 DIAGNOSIS — E1122 Type 2 diabetes mellitus with diabetic chronic kidney disease: Secondary | ICD-10-CM | POA: Diagnosis not present

## 2017-04-28 DIAGNOSIS — Z299 Encounter for prophylactic measures, unspecified: Secondary | ICD-10-CM | POA: Diagnosis not present

## 2017-04-28 DIAGNOSIS — E7800 Pure hypercholesterolemia, unspecified: Secondary | ICD-10-CM | POA: Diagnosis not present

## 2017-04-28 DIAGNOSIS — I6529 Occlusion and stenosis of unspecified carotid artery: Secondary | ICD-10-CM | POA: Diagnosis not present

## 2017-04-28 DIAGNOSIS — I482 Chronic atrial fibrillation: Secondary | ICD-10-CM | POA: Diagnosis not present

## 2017-05-07 DIAGNOSIS — I482 Chronic atrial fibrillation: Secondary | ICD-10-CM | POA: Diagnosis not present

## 2017-05-07 DIAGNOSIS — E7800 Pure hypercholesterolemia, unspecified: Secondary | ICD-10-CM | POA: Diagnosis not present

## 2017-05-07 DIAGNOSIS — I10 Essential (primary) hypertension: Secondary | ICD-10-CM | POA: Diagnosis not present

## 2017-05-07 DIAGNOSIS — Z6829 Body mass index (BMI) 29.0-29.9, adult: Secondary | ICD-10-CM | POA: Diagnosis not present

## 2017-05-07 DIAGNOSIS — E1165 Type 2 diabetes mellitus with hyperglycemia: Secondary | ICD-10-CM | POA: Diagnosis not present

## 2017-05-07 DIAGNOSIS — Z299 Encounter for prophylactic measures, unspecified: Secondary | ICD-10-CM | POA: Diagnosis not present

## 2017-05-12 DIAGNOSIS — F329 Major depressive disorder, single episode, unspecified: Secondary | ICD-10-CM | POA: Diagnosis not present

## 2017-05-12 DIAGNOSIS — H3530 Unspecified macular degeneration: Secondary | ICD-10-CM | POA: Diagnosis not present

## 2017-05-12 DIAGNOSIS — E1165 Type 2 diabetes mellitus with hyperglycemia: Secondary | ICD-10-CM | POA: Diagnosis not present

## 2017-05-12 DIAGNOSIS — M810 Age-related osteoporosis without current pathological fracture: Secondary | ICD-10-CM | POA: Diagnosis not present

## 2017-05-12 DIAGNOSIS — I6529 Occlusion and stenosis of unspecified carotid artery: Secondary | ICD-10-CM | POA: Diagnosis not present

## 2017-05-12 DIAGNOSIS — I10 Essential (primary) hypertension: Secondary | ICD-10-CM | POA: Diagnosis not present

## 2017-05-12 DIAGNOSIS — E7800 Pure hypercholesterolemia, unspecified: Secondary | ICD-10-CM | POA: Diagnosis not present

## 2017-05-12 DIAGNOSIS — Z6829 Body mass index (BMI) 29.0-29.9, adult: Secondary | ICD-10-CM | POA: Diagnosis not present

## 2017-05-12 DIAGNOSIS — E1142 Type 2 diabetes mellitus with diabetic polyneuropathy: Secondary | ICD-10-CM | POA: Diagnosis not present

## 2017-05-12 DIAGNOSIS — N184 Chronic kidney disease, stage 4 (severe): Secondary | ICD-10-CM | POA: Diagnosis not present

## 2017-05-12 DIAGNOSIS — L0390 Cellulitis, unspecified: Secondary | ICD-10-CM | POA: Diagnosis not present

## 2017-05-12 DIAGNOSIS — Z299 Encounter for prophylactic measures, unspecified: Secondary | ICD-10-CM | POA: Diagnosis not present

## 2017-05-19 DIAGNOSIS — L110 Acquired keratosis follicularis: Secondary | ICD-10-CM | POA: Diagnosis not present

## 2017-05-19 DIAGNOSIS — E1140 Type 2 diabetes mellitus with diabetic neuropathy, unspecified: Secondary | ICD-10-CM | POA: Diagnosis not present

## 2017-05-19 DIAGNOSIS — B351 Tinea unguium: Secondary | ICD-10-CM | POA: Diagnosis not present

## 2017-05-19 DIAGNOSIS — E1151 Type 2 diabetes mellitus with diabetic peripheral angiopathy without gangrene: Secondary | ICD-10-CM | POA: Diagnosis not present

## 2017-05-21 DIAGNOSIS — I10 Essential (primary) hypertension: Secondary | ICD-10-CM | POA: Diagnosis not present

## 2017-05-21 DIAGNOSIS — E1122 Type 2 diabetes mellitus with diabetic chronic kidney disease: Secondary | ICD-10-CM | POA: Diagnosis not present

## 2017-05-21 DIAGNOSIS — Z299 Encounter for prophylactic measures, unspecified: Secondary | ICD-10-CM | POA: Diagnosis not present

## 2017-05-21 DIAGNOSIS — M810 Age-related osteoporosis without current pathological fracture: Secondary | ICD-10-CM | POA: Diagnosis not present

## 2017-05-21 DIAGNOSIS — E1142 Type 2 diabetes mellitus with diabetic polyneuropathy: Secondary | ICD-10-CM | POA: Diagnosis not present

## 2017-05-21 DIAGNOSIS — E7800 Pure hypercholesterolemia, unspecified: Secondary | ICD-10-CM | POA: Diagnosis not present

## 2017-05-21 DIAGNOSIS — N184 Chronic kidney disease, stage 4 (severe): Secondary | ICD-10-CM | POA: Diagnosis not present

## 2017-05-21 DIAGNOSIS — I482 Chronic atrial fibrillation: Secondary | ICD-10-CM | POA: Diagnosis not present

## 2017-05-21 DIAGNOSIS — L0390 Cellulitis, unspecified: Secondary | ICD-10-CM | POA: Diagnosis not present

## 2017-05-21 DIAGNOSIS — F329 Major depressive disorder, single episode, unspecified: Secondary | ICD-10-CM | POA: Diagnosis not present

## 2017-05-21 DIAGNOSIS — E663 Overweight: Secondary | ICD-10-CM | POA: Diagnosis not present

## 2017-05-21 DIAGNOSIS — E1165 Type 2 diabetes mellitus with hyperglycemia: Secondary | ICD-10-CM | POA: Diagnosis not present

## 2017-06-01 DIAGNOSIS — F329 Major depressive disorder, single episode, unspecified: Secondary | ICD-10-CM | POA: Diagnosis not present

## 2017-06-01 DIAGNOSIS — E1165 Type 2 diabetes mellitus with hyperglycemia: Secondary | ICD-10-CM | POA: Diagnosis not present

## 2017-06-01 DIAGNOSIS — F418 Other specified anxiety disorders: Secondary | ICD-10-CM | POA: Diagnosis not present

## 2017-06-01 DIAGNOSIS — E663 Overweight: Secondary | ICD-10-CM | POA: Diagnosis not present

## 2017-06-01 DIAGNOSIS — Z299 Encounter for prophylactic measures, unspecified: Secondary | ICD-10-CM | POA: Diagnosis not present

## 2017-06-01 DIAGNOSIS — W19XXXA Unspecified fall, initial encounter: Secondary | ICD-10-CM | POA: Diagnosis not present

## 2017-06-01 DIAGNOSIS — I10 Essential (primary) hypertension: Secondary | ICD-10-CM | POA: Diagnosis not present

## 2017-06-01 DIAGNOSIS — E7800 Pure hypercholesterolemia, unspecified: Secondary | ICD-10-CM | POA: Diagnosis not present

## 2017-06-01 DIAGNOSIS — I482 Chronic atrial fibrillation: Secondary | ICD-10-CM | POA: Diagnosis not present

## 2017-06-01 DIAGNOSIS — L0390 Cellulitis, unspecified: Secondary | ICD-10-CM | POA: Diagnosis not present

## 2017-06-01 DIAGNOSIS — E1122 Type 2 diabetes mellitus with diabetic chronic kidney disease: Secondary | ICD-10-CM | POA: Diagnosis not present

## 2017-06-01 DIAGNOSIS — I6529 Occlusion and stenosis of unspecified carotid artery: Secondary | ICD-10-CM | POA: Diagnosis not present

## 2017-06-02 DIAGNOSIS — S99921A Unspecified injury of right foot, initial encounter: Secondary | ICD-10-CM | POA: Diagnosis not present

## 2017-06-02 DIAGNOSIS — R600 Localized edema: Secondary | ICD-10-CM | POA: Diagnosis not present

## 2017-06-02 DIAGNOSIS — S6992XA Unspecified injury of left wrist, hand and finger(s), initial encounter: Secondary | ICD-10-CM | POA: Diagnosis not present

## 2017-06-02 DIAGNOSIS — M79674 Pain in right toe(s): Secondary | ICD-10-CM | POA: Diagnosis not present

## 2017-06-02 DIAGNOSIS — M25674 Stiffness of right foot, not elsewhere classified: Secondary | ICD-10-CM | POA: Diagnosis not present

## 2017-06-02 DIAGNOSIS — S62635A Displaced fracture of distal phalanx of left ring finger, initial encounter for closed fracture: Secondary | ICD-10-CM | POA: Diagnosis not present

## 2017-06-08 DIAGNOSIS — S62665D Nondisplaced fracture of distal phalanx of left ring finger, subsequent encounter for fracture with routine healing: Secondary | ICD-10-CM | POA: Diagnosis not present

## 2017-06-15 DIAGNOSIS — Z23 Encounter for immunization: Secondary | ICD-10-CM | POA: Diagnosis not present

## 2017-06-16 DIAGNOSIS — Z23 Encounter for immunization: Secondary | ICD-10-CM | POA: Diagnosis not present

## 2017-06-18 DIAGNOSIS — M25542 Pain in joints of left hand: Secondary | ICD-10-CM | POA: Diagnosis not present

## 2017-06-18 DIAGNOSIS — M150 Primary generalized (osteo)arthritis: Secondary | ICD-10-CM | POA: Diagnosis not present

## 2017-06-18 DIAGNOSIS — I10 Essential (primary) hypertension: Secondary | ICD-10-CM | POA: Diagnosis not present

## 2017-06-18 DIAGNOSIS — M20012 Mallet finger of left finger(s): Secondary | ICD-10-CM | POA: Diagnosis not present

## 2017-06-19 DIAGNOSIS — I482 Chronic atrial fibrillation: Secondary | ICD-10-CM | POA: Diagnosis not present

## 2017-06-19 DIAGNOSIS — E1165 Type 2 diabetes mellitus with hyperglycemia: Secondary | ICD-10-CM | POA: Diagnosis not present

## 2017-06-19 DIAGNOSIS — E663 Overweight: Secondary | ICD-10-CM | POA: Diagnosis not present

## 2017-06-19 DIAGNOSIS — I6529 Occlusion and stenosis of unspecified carotid artery: Secondary | ICD-10-CM | POA: Diagnosis not present

## 2017-06-19 DIAGNOSIS — F329 Major depressive disorder, single episode, unspecified: Secondary | ICD-10-CM | POA: Diagnosis not present

## 2017-06-19 DIAGNOSIS — N184 Chronic kidney disease, stage 4 (severe): Secondary | ICD-10-CM | POA: Diagnosis not present

## 2017-06-19 DIAGNOSIS — R21 Rash and other nonspecific skin eruption: Secondary | ICD-10-CM | POA: Diagnosis not present

## 2017-06-19 DIAGNOSIS — Z299 Encounter for prophylactic measures, unspecified: Secondary | ICD-10-CM | POA: Diagnosis not present

## 2017-06-19 DIAGNOSIS — I10 Essential (primary) hypertension: Secondary | ICD-10-CM | POA: Diagnosis not present

## 2017-06-19 DIAGNOSIS — E7800 Pure hypercholesterolemia, unspecified: Secondary | ICD-10-CM | POA: Diagnosis not present

## 2017-06-19 DIAGNOSIS — L0390 Cellulitis, unspecified: Secondary | ICD-10-CM | POA: Diagnosis not present

## 2017-06-19 DIAGNOSIS — E1122 Type 2 diabetes mellitus with diabetic chronic kidney disease: Secondary | ICD-10-CM | POA: Diagnosis not present

## 2017-06-22 DIAGNOSIS — S62665D Nondisplaced fracture of distal phalanx of left ring finger, subsequent encounter for fracture with routine healing: Secondary | ICD-10-CM | POA: Diagnosis not present

## 2017-07-07 DIAGNOSIS — H353134 Nonexudative age-related macular degeneration, bilateral, advanced atrophic with subfoveal involvement: Secondary | ICD-10-CM | POA: Diagnosis not present

## 2017-07-07 DIAGNOSIS — H43811 Vitreous degeneration, right eye: Secondary | ICD-10-CM | POA: Diagnosis not present

## 2017-07-07 DIAGNOSIS — H31009 Unspecified chorioretinal scars, unspecified eye: Secondary | ICD-10-CM | POA: Diagnosis not present

## 2017-07-07 DIAGNOSIS — H353232 Exudative age-related macular degeneration, bilateral, with inactive choroidal neovascularization: Secondary | ICD-10-CM | POA: Diagnosis not present

## 2017-07-07 DIAGNOSIS — E113553 Type 2 diabetes mellitus with stable proliferative diabetic retinopathy, bilateral: Secondary | ICD-10-CM | POA: Diagnosis not present

## 2017-07-08 DIAGNOSIS — E1165 Type 2 diabetes mellitus with hyperglycemia: Secondary | ICD-10-CM | POA: Diagnosis not present

## 2017-07-08 DIAGNOSIS — F329 Major depressive disorder, single episode, unspecified: Secondary | ICD-10-CM | POA: Diagnosis not present

## 2017-07-08 DIAGNOSIS — N184 Chronic kidney disease, stage 4 (severe): Secondary | ICD-10-CM | POA: Diagnosis not present

## 2017-07-08 DIAGNOSIS — Z7189 Other specified counseling: Secondary | ICD-10-CM | POA: Diagnosis not present

## 2017-07-08 DIAGNOSIS — Z1331 Encounter for screening for depression: Secondary | ICD-10-CM | POA: Diagnosis not present

## 2017-07-08 DIAGNOSIS — I10 Essential (primary) hypertension: Secondary | ICD-10-CM | POA: Diagnosis not present

## 2017-07-08 DIAGNOSIS — Z299 Encounter for prophylactic measures, unspecified: Secondary | ICD-10-CM | POA: Diagnosis not present

## 2017-07-08 DIAGNOSIS — E1122 Type 2 diabetes mellitus with diabetic chronic kidney disease: Secondary | ICD-10-CM | POA: Diagnosis not present

## 2017-07-08 DIAGNOSIS — Z0000 Encounter for general adult medical examination without abnormal findings: Secondary | ICD-10-CM | POA: Diagnosis not present

## 2017-07-08 DIAGNOSIS — Z1339 Encounter for screening examination for other mental health and behavioral disorders: Secondary | ICD-10-CM | POA: Diagnosis not present

## 2017-07-08 DIAGNOSIS — E663 Overweight: Secondary | ICD-10-CM | POA: Diagnosis not present

## 2017-07-08 DIAGNOSIS — Z1211 Encounter for screening for malignant neoplasm of colon: Secondary | ICD-10-CM | POA: Diagnosis not present

## 2017-07-13 DIAGNOSIS — S62665D Nondisplaced fracture of distal phalanx of left ring finger, subsequent encounter for fracture with routine healing: Secondary | ICD-10-CM | POA: Diagnosis not present

## 2017-07-28 DIAGNOSIS — I10 Essential (primary) hypertension: Secondary | ICD-10-CM | POA: Diagnosis not present

## 2017-07-28 DIAGNOSIS — E7800 Pure hypercholesterolemia, unspecified: Secondary | ICD-10-CM | POA: Diagnosis not present

## 2017-07-28 DIAGNOSIS — E119 Type 2 diabetes mellitus without complications: Secondary | ICD-10-CM | POA: Diagnosis not present

## 2017-08-04 DIAGNOSIS — E1151 Type 2 diabetes mellitus with diabetic peripheral angiopathy without gangrene: Secondary | ICD-10-CM | POA: Diagnosis not present

## 2017-08-04 DIAGNOSIS — L110 Acquired keratosis follicularis: Secondary | ICD-10-CM | POA: Diagnosis not present

## 2017-08-04 DIAGNOSIS — E1140 Type 2 diabetes mellitus with diabetic neuropathy, unspecified: Secondary | ICD-10-CM | POA: Diagnosis not present

## 2017-08-04 DIAGNOSIS — B351 Tinea unguium: Secondary | ICD-10-CM | POA: Diagnosis not present

## 2017-08-13 DIAGNOSIS — Z299 Encounter for prophylactic measures, unspecified: Secondary | ICD-10-CM | POA: Diagnosis not present

## 2017-08-13 DIAGNOSIS — Z6828 Body mass index (BMI) 28.0-28.9, adult: Secondary | ICD-10-CM | POA: Diagnosis not present

## 2017-08-13 DIAGNOSIS — I639 Cerebral infarction, unspecified: Secondary | ICD-10-CM | POA: Diagnosis not present

## 2017-08-13 DIAGNOSIS — M810 Age-related osteoporosis without current pathological fracture: Secondary | ICD-10-CM | POA: Diagnosis not present

## 2017-08-13 DIAGNOSIS — E1165 Type 2 diabetes mellitus with hyperglycemia: Secondary | ICD-10-CM | POA: Diagnosis not present

## 2017-08-13 DIAGNOSIS — I482 Chronic atrial fibrillation: Secondary | ICD-10-CM | POA: Diagnosis not present

## 2017-09-22 ENCOUNTER — Telehealth: Payer: Self-pay | Admitting: *Deleted

## 2017-09-22 ENCOUNTER — Ambulatory Visit: Payer: Medicare Other | Admitting: Neurology

## 2017-09-22 NOTE — Telephone Encounter (Signed)
Pt no show f/u appt on 09/22/2017 @ 2:30.

## 2017-09-23 ENCOUNTER — Encounter: Payer: Self-pay | Admitting: Neurology

## 2017-10-16 DIAGNOSIS — K644 Residual hemorrhoidal skin tags: Secondary | ICD-10-CM | POA: Diagnosis not present

## 2017-10-16 DIAGNOSIS — Z299 Encounter for prophylactic measures, unspecified: Secondary | ICD-10-CM | POA: Diagnosis not present

## 2017-10-16 DIAGNOSIS — Z6828 Body mass index (BMI) 28.0-28.9, adult: Secondary | ICD-10-CM | POA: Diagnosis not present

## 2017-10-16 DIAGNOSIS — E1165 Type 2 diabetes mellitus with hyperglycemia: Secondary | ICD-10-CM | POA: Diagnosis not present

## 2017-10-16 DIAGNOSIS — I639 Cerebral infarction, unspecified: Secondary | ICD-10-CM | POA: Diagnosis not present

## 2017-10-16 DIAGNOSIS — I482 Chronic atrial fibrillation: Secondary | ICD-10-CM | POA: Diagnosis not present

## 2017-10-16 DIAGNOSIS — I10 Essential (primary) hypertension: Secondary | ICD-10-CM | POA: Diagnosis not present

## 2017-10-16 DIAGNOSIS — Z789 Other specified health status: Secondary | ICD-10-CM | POA: Diagnosis not present

## 2017-10-16 DIAGNOSIS — K625 Hemorrhage of anus and rectum: Secondary | ICD-10-CM | POA: Diagnosis not present

## 2017-10-17 DIAGNOSIS — Z8673 Personal history of transient ischemic attack (TIA), and cerebral infarction without residual deficits: Secondary | ICD-10-CM | POA: Diagnosis not present

## 2017-10-17 DIAGNOSIS — M810 Age-related osteoporosis without current pathological fracture: Secondary | ICD-10-CM | POA: Diagnosis present

## 2017-10-17 DIAGNOSIS — M159 Polyosteoarthritis, unspecified: Secondary | ICD-10-CM | POA: Diagnosis present

## 2017-10-17 DIAGNOSIS — I4891 Unspecified atrial fibrillation: Secondary | ICD-10-CM | POA: Diagnosis not present

## 2017-10-17 DIAGNOSIS — I110 Hypertensive heart disease with heart failure: Secondary | ICD-10-CM | POA: Diagnosis present

## 2017-10-17 DIAGNOSIS — M479 Spondylosis, unspecified: Secondary | ICD-10-CM | POA: Diagnosis present

## 2017-10-17 DIAGNOSIS — F419 Anxiety disorder, unspecified: Secondary | ICD-10-CM | POA: Diagnosis present

## 2017-10-17 DIAGNOSIS — R000 Tachycardia, unspecified: Secondary | ICD-10-CM | POA: Diagnosis not present

## 2017-10-17 DIAGNOSIS — E119 Type 2 diabetes mellitus without complications: Secondary | ICD-10-CM | POA: Diagnosis present

## 2017-10-17 DIAGNOSIS — F329 Major depressive disorder, single episode, unspecified: Secondary | ICD-10-CM | POA: Diagnosis present

## 2017-10-17 DIAGNOSIS — R0989 Other specified symptoms and signs involving the circulatory and respiratory systems: Secondary | ICD-10-CM | POA: Diagnosis not present

## 2017-10-17 DIAGNOSIS — H548 Legal blindness, as defined in USA: Secondary | ICD-10-CM | POA: Diagnosis present

## 2017-10-17 DIAGNOSIS — Z79899 Other long term (current) drug therapy: Secondary | ICD-10-CM | POA: Diagnosis not present

## 2017-10-17 DIAGNOSIS — G309 Alzheimer's disease, unspecified: Secondary | ICD-10-CM | POA: Diagnosis present

## 2017-10-17 DIAGNOSIS — I5031 Acute diastolic (congestive) heart failure: Secondary | ICD-10-CM | POA: Diagnosis not present

## 2017-10-17 DIAGNOSIS — K5730 Diverticulosis of large intestine without perforation or abscess without bleeding: Secondary | ICD-10-CM | POA: Diagnosis not present

## 2017-10-17 DIAGNOSIS — I482 Chronic atrial fibrillation: Secondary | ICD-10-CM | POA: Diagnosis not present

## 2017-10-17 DIAGNOSIS — K625 Hemorrhage of anus and rectum: Secondary | ICD-10-CM | POA: Diagnosis not present

## 2017-10-17 DIAGNOSIS — I2510 Atherosclerotic heart disease of native coronary artery without angina pectoris: Secondary | ICD-10-CM | POA: Diagnosis present

## 2017-10-17 DIAGNOSIS — I509 Heart failure, unspecified: Secondary | ICD-10-CM | POA: Diagnosis not present

## 2017-10-17 DIAGNOSIS — F0280 Dementia in other diseases classified elsewhere without behavioral disturbance: Secondary | ICD-10-CM | POA: Diagnosis not present

## 2017-10-17 DIAGNOSIS — Z85038 Personal history of other malignant neoplasm of large intestine: Secondary | ICD-10-CM | POA: Diagnosis not present

## 2017-10-17 DIAGNOSIS — Z886 Allergy status to analgesic agent status: Secondary | ICD-10-CM | POA: Diagnosis not present

## 2017-10-17 DIAGNOSIS — Z7984 Long term (current) use of oral hypoglycemic drugs: Secondary | ICD-10-CM | POA: Diagnosis not present

## 2017-10-17 DIAGNOSIS — Z7901 Long term (current) use of anticoagulants: Secondary | ICD-10-CM | POA: Diagnosis not present

## 2017-10-19 DIAGNOSIS — I5030 Unspecified diastolic (congestive) heart failure: Secondary | ICD-10-CM | POA: Diagnosis not present

## 2017-10-30 DIAGNOSIS — G309 Alzheimer's disease, unspecified: Secondary | ICD-10-CM | POA: Diagnosis not present

## 2017-10-30 DIAGNOSIS — E860 Dehydration: Secondary | ICD-10-CM | POA: Diagnosis not present

## 2017-10-30 DIAGNOSIS — E119 Type 2 diabetes mellitus without complications: Secondary | ICD-10-CM | POA: Diagnosis not present

## 2017-10-30 DIAGNOSIS — I700 Atherosclerosis of aorta: Secondary | ICD-10-CM | POA: Diagnosis not present

## 2017-10-30 DIAGNOSIS — N289 Disorder of kidney and ureter, unspecified: Secondary | ICD-10-CM | POA: Diagnosis not present

## 2017-10-30 DIAGNOSIS — T50905A Adverse effect of unspecified drugs, medicaments and biological substances, initial encounter: Secondary | ICD-10-CM | POA: Diagnosis not present

## 2017-10-30 DIAGNOSIS — K625 Hemorrhage of anus and rectum: Secondary | ICD-10-CM | POA: Diagnosis not present

## 2017-10-30 DIAGNOSIS — I2510 Atherosclerotic heart disease of native coronary artery without angina pectoris: Secondary | ICD-10-CM | POA: Diagnosis not present

## 2017-10-30 DIAGNOSIS — I4510 Unspecified right bundle-branch block: Secondary | ICD-10-CM | POA: Diagnosis not present

## 2017-10-30 DIAGNOSIS — T887XXA Unspecified adverse effect of drug or medicament, initial encounter: Secondary | ICD-10-CM | POA: Diagnosis not present

## 2017-10-30 DIAGNOSIS — F0280 Dementia in other diseases classified elsewhere without behavioral disturbance: Secondary | ICD-10-CM | POA: Diagnosis not present

## 2017-10-30 DIAGNOSIS — Z79899 Other long term (current) drug therapy: Secondary | ICD-10-CM | POA: Diagnosis not present

## 2017-10-30 DIAGNOSIS — Z85038 Personal history of other malignant neoplasm of large intestine: Secondary | ICD-10-CM | POA: Diagnosis not present

## 2017-10-30 DIAGNOSIS — H548 Legal blindness, as defined in USA: Secondary | ICD-10-CM | POA: Diagnosis not present

## 2017-10-30 DIAGNOSIS — I10 Essential (primary) hypertension: Secondary | ICD-10-CM | POA: Diagnosis not present

## 2017-11-26 DIAGNOSIS — R413 Other amnesia: Secondary | ICD-10-CM | POA: Diagnosis not present

## 2017-11-26 DIAGNOSIS — Z79899 Other long term (current) drug therapy: Secondary | ICD-10-CM | POA: Diagnosis not present

## 2017-11-26 DIAGNOSIS — I10 Essential (primary) hypertension: Secondary | ICD-10-CM | POA: Diagnosis not present

## 2017-11-26 DIAGNOSIS — R609 Edema, unspecified: Secondary | ICD-10-CM | POA: Diagnosis not present

## 2017-11-26 DIAGNOSIS — E1165 Type 2 diabetes mellitus with hyperglycemia: Secondary | ICD-10-CM | POA: Diagnosis not present

## 2017-11-26 DIAGNOSIS — F329 Major depressive disorder, single episode, unspecified: Secondary | ICD-10-CM | POA: Diagnosis not present

## 2017-11-26 DIAGNOSIS — I482 Chronic atrial fibrillation: Secondary | ICD-10-CM | POA: Diagnosis not present

## 2017-11-26 DIAGNOSIS — R5383 Other fatigue: Secondary | ICD-10-CM | POA: Diagnosis not present

## 2017-11-26 DIAGNOSIS — Z6827 Body mass index (BMI) 27.0-27.9, adult: Secondary | ICD-10-CM | POA: Diagnosis not present

## 2017-11-26 DIAGNOSIS — Z299 Encounter for prophylactic measures, unspecified: Secondary | ICD-10-CM | POA: Diagnosis not present

## 2017-12-10 DIAGNOSIS — D649 Anemia, unspecified: Secondary | ICD-10-CM | POA: Diagnosis not present

## 2017-12-17 DIAGNOSIS — I10 Essential (primary) hypertension: Secondary | ICD-10-CM | POA: Diagnosis not present

## 2017-12-17 DIAGNOSIS — E119 Type 2 diabetes mellitus without complications: Secondary | ICD-10-CM | POA: Diagnosis not present

## 2017-12-17 DIAGNOSIS — E7800 Pure hypercholesterolemia, unspecified: Secondary | ICD-10-CM | POA: Diagnosis not present

## 2018-02-11 DIAGNOSIS — I10 Essential (primary) hypertension: Secondary | ICD-10-CM | POA: Diagnosis not present

## 2018-02-11 DIAGNOSIS — E119 Type 2 diabetes mellitus without complications: Secondary | ICD-10-CM | POA: Diagnosis not present

## 2018-02-11 DIAGNOSIS — E7800 Pure hypercholesterolemia, unspecified: Secondary | ICD-10-CM | POA: Diagnosis not present

## 2018-02-17 ENCOUNTER — Other Ambulatory Visit: Payer: Self-pay | Admitting: Neurology

## 2018-03-08 DIAGNOSIS — I10 Essential (primary) hypertension: Secondary | ICD-10-CM | POA: Diagnosis not present

## 2018-03-08 DIAGNOSIS — E7800 Pure hypercholesterolemia, unspecified: Secondary | ICD-10-CM | POA: Diagnosis not present

## 2018-03-08 DIAGNOSIS — E119 Type 2 diabetes mellitus without complications: Secondary | ICD-10-CM | POA: Diagnosis not present

## 2018-04-05 DIAGNOSIS — I10 Essential (primary) hypertension: Secondary | ICD-10-CM | POA: Diagnosis not present

## 2018-04-05 DIAGNOSIS — E7800 Pure hypercholesterolemia, unspecified: Secondary | ICD-10-CM | POA: Diagnosis not present

## 2018-04-05 DIAGNOSIS — E119 Type 2 diabetes mellitus without complications: Secondary | ICD-10-CM | POA: Diagnosis not present

## 2018-06-03 DIAGNOSIS — E119 Type 2 diabetes mellitus without complications: Secondary | ICD-10-CM | POA: Diagnosis not present

## 2018-06-03 DIAGNOSIS — I10 Essential (primary) hypertension: Secondary | ICD-10-CM | POA: Diagnosis not present

## 2018-06-03 DIAGNOSIS — E7800 Pure hypercholesterolemia, unspecified: Secondary | ICD-10-CM | POA: Diagnosis not present

## 2018-06-16 ENCOUNTER — Other Ambulatory Visit: Payer: Self-pay | Admitting: Neurology

## 2018-06-24 DIAGNOSIS — I10 Essential (primary) hypertension: Secondary | ICD-10-CM | POA: Diagnosis not present

## 2018-06-24 DIAGNOSIS — E119 Type 2 diabetes mellitus without complications: Secondary | ICD-10-CM | POA: Diagnosis not present

## 2018-06-24 DIAGNOSIS — E7800 Pure hypercholesterolemia, unspecified: Secondary | ICD-10-CM | POA: Diagnosis not present

## 2018-07-05 DIAGNOSIS — Z23 Encounter for immunization: Secondary | ICD-10-CM | POA: Diagnosis not present

## 2018-07-15 DIAGNOSIS — H353134 Nonexudative age-related macular degeneration, bilateral, advanced atrophic with subfoveal involvement: Secondary | ICD-10-CM | POA: Diagnosis not present

## 2018-07-15 DIAGNOSIS — H43811 Vitreous degeneration, right eye: Secondary | ICD-10-CM | POA: Diagnosis not present

## 2018-07-15 DIAGNOSIS — H353232 Exudative age-related macular degeneration, bilateral, with inactive choroidal neovascularization: Secondary | ICD-10-CM | POA: Diagnosis not present

## 2018-07-15 DIAGNOSIS — E113553 Type 2 diabetes mellitus with stable proliferative diabetic retinopathy, bilateral: Secondary | ICD-10-CM | POA: Diagnosis not present

## 2018-07-23 DIAGNOSIS — I10 Essential (primary) hypertension: Secondary | ICD-10-CM | POA: Diagnosis not present

## 2018-07-23 DIAGNOSIS — E119 Type 2 diabetes mellitus without complications: Secondary | ICD-10-CM | POA: Diagnosis not present

## 2018-07-23 DIAGNOSIS — E7800 Pure hypercholesterolemia, unspecified: Secondary | ICD-10-CM | POA: Diagnosis not present

## 2018-08-20 DIAGNOSIS — E119 Type 2 diabetes mellitus without complications: Secondary | ICD-10-CM | POA: Diagnosis not present

## 2018-08-20 DIAGNOSIS — I10 Essential (primary) hypertension: Secondary | ICD-10-CM | POA: Diagnosis not present

## 2018-08-20 DIAGNOSIS — E7800 Pure hypercholesterolemia, unspecified: Secondary | ICD-10-CM | POA: Diagnosis not present

## 2018-11-19 DIAGNOSIS — I10 Essential (primary) hypertension: Secondary | ICD-10-CM | POA: Diagnosis not present

## 2018-11-19 DIAGNOSIS — E119 Type 2 diabetes mellitus without complications: Secondary | ICD-10-CM | POA: Diagnosis not present

## 2018-11-19 DIAGNOSIS — E7800 Pure hypercholesterolemia, unspecified: Secondary | ICD-10-CM | POA: Diagnosis not present

## 2018-12-13 DIAGNOSIS — R413 Other amnesia: Secondary | ICD-10-CM | POA: Diagnosis not present

## 2018-12-13 DIAGNOSIS — I482 Chronic atrial fibrillation, unspecified: Secondary | ICD-10-CM | POA: Diagnosis not present

## 2018-12-13 DIAGNOSIS — Z299 Encounter for prophylactic measures, unspecified: Secondary | ICD-10-CM | POA: Diagnosis not present

## 2018-12-13 DIAGNOSIS — E1122 Type 2 diabetes mellitus with diabetic chronic kidney disease: Secondary | ICD-10-CM | POA: Diagnosis not present

## 2018-12-13 DIAGNOSIS — E1165 Type 2 diabetes mellitus with hyperglycemia: Secondary | ICD-10-CM | POA: Diagnosis not present

## 2018-12-14 DIAGNOSIS — R509 Fever, unspecified: Secondary | ICD-10-CM | POA: Diagnosis not present

## 2018-12-14 DIAGNOSIS — Z79899 Other long term (current) drug therapy: Secondary | ICD-10-CM | POA: Diagnosis not present

## 2018-12-14 DIAGNOSIS — R5383 Other fatigue: Secondary | ICD-10-CM | POA: Diagnosis not present

## 2018-12-20 DIAGNOSIS — E119 Type 2 diabetes mellitus without complications: Secondary | ICD-10-CM | POA: Diagnosis not present

## 2018-12-20 DIAGNOSIS — E7800 Pure hypercholesterolemia, unspecified: Secondary | ICD-10-CM | POA: Diagnosis not present

## 2018-12-20 DIAGNOSIS — I10 Essential (primary) hypertension: Secondary | ICD-10-CM | POA: Diagnosis not present

## 2019-01-26 DIAGNOSIS — I10 Essential (primary) hypertension: Secondary | ICD-10-CM | POA: Diagnosis not present

## 2019-01-26 DIAGNOSIS — E119 Type 2 diabetes mellitus without complications: Secondary | ICD-10-CM | POA: Diagnosis not present

## 2019-01-26 DIAGNOSIS — E7800 Pure hypercholesterolemia, unspecified: Secondary | ICD-10-CM | POA: Diagnosis not present

## 2019-02-11 DIAGNOSIS — R5383 Other fatigue: Secondary | ICD-10-CM | POA: Diagnosis not present

## 2019-02-11 DIAGNOSIS — D649 Anemia, unspecified: Secondary | ICD-10-CM | POA: Diagnosis not present

## 2019-02-19 DIAGNOSIS — R1310 Dysphagia, unspecified: Secondary | ICD-10-CM | POA: Diagnosis present

## 2019-02-19 DIAGNOSIS — Z885 Allergy status to narcotic agent status: Secondary | ICD-10-CM | POA: Diagnosis not present

## 2019-02-19 DIAGNOSIS — R739 Hyperglycemia, unspecified: Secondary | ICD-10-CM | POA: Diagnosis not present

## 2019-02-19 DIAGNOSIS — R000 Tachycardia, unspecified: Secondary | ICD-10-CM | POA: Diagnosis not present

## 2019-02-19 DIAGNOSIS — I639 Cerebral infarction, unspecified: Secondary | ICD-10-CM | POA: Diagnosis not present

## 2019-02-19 DIAGNOSIS — I69351 Hemiplegia and hemiparesis following cerebral infarction affecting right dominant side: Secondary | ICD-10-CM | POA: Diagnosis not present

## 2019-02-19 DIAGNOSIS — I10 Essential (primary) hypertension: Secondary | ICD-10-CM | POA: Diagnosis not present

## 2019-02-19 DIAGNOSIS — R29810 Facial weakness: Secondary | ICD-10-CM | POA: Diagnosis not present

## 2019-02-19 DIAGNOSIS — R404 Transient alteration of awareness: Secondary | ICD-10-CM | POA: Diagnosis not present

## 2019-02-19 DIAGNOSIS — F0391 Unspecified dementia with behavioral disturbance: Secondary | ICD-10-CM | POA: Diagnosis present

## 2019-02-19 DIAGNOSIS — R4701 Aphasia: Secondary | ICD-10-CM | POA: Diagnosis present

## 2019-02-19 DIAGNOSIS — G309 Alzheimer's disease, unspecified: Secondary | ICD-10-CM | POA: Diagnosis not present

## 2019-02-19 DIAGNOSIS — I4891 Unspecified atrial fibrillation: Secondary | ICD-10-CM | POA: Diagnosis not present

## 2019-02-19 DIAGNOSIS — I480 Paroxysmal atrial fibrillation: Secondary | ICD-10-CM | POA: Diagnosis present

## 2019-02-19 DIAGNOSIS — I2510 Atherosclerotic heart disease of native coronary artery without angina pectoris: Secondary | ICD-10-CM | POA: Diagnosis not present

## 2019-02-19 DIAGNOSIS — Z9282 Status post administration of tPA (rtPA) in a different facility within the last 24 hours prior to admission to current facility: Secondary | ICD-10-CM | POA: Diagnosis not present

## 2019-02-19 DIAGNOSIS — I5189 Other ill-defined heart diseases: Secondary | ICD-10-CM | POA: Diagnosis not present

## 2019-02-19 DIAGNOSIS — R531 Weakness: Secondary | ICD-10-CM | POA: Diagnosis not present

## 2019-02-19 DIAGNOSIS — R4182 Altered mental status, unspecified: Secondary | ICD-10-CM | POA: Diagnosis not present

## 2019-02-19 DIAGNOSIS — F0390 Unspecified dementia without behavioral disturbance: Secondary | ICD-10-CM | POA: Diagnosis not present

## 2019-02-19 DIAGNOSIS — D649 Anemia, unspecified: Secondary | ICD-10-CM | POA: Diagnosis present

## 2019-02-19 DIAGNOSIS — R29818 Other symptoms and signs involving the nervous system: Secondary | ICD-10-CM | POA: Diagnosis not present

## 2019-02-19 DIAGNOSIS — I63512 Cerebral infarction due to unspecified occlusion or stenosis of left middle cerebral artery: Secondary | ICD-10-CM | POA: Diagnosis present

## 2019-02-19 DIAGNOSIS — Z7901 Long term (current) use of anticoagulants: Secondary | ICD-10-CM | POA: Diagnosis not present

## 2019-02-19 DIAGNOSIS — I6602 Occlusion and stenosis of left middle cerebral artery: Secondary | ICD-10-CM | POA: Diagnosis not present

## 2019-02-19 DIAGNOSIS — R29709 NIHSS score 9: Secondary | ICD-10-CM | POA: Diagnosis present

## 2019-02-19 DIAGNOSIS — I6523 Occlusion and stenosis of bilateral carotid arteries: Secondary | ICD-10-CM | POA: Diagnosis not present

## 2019-02-19 DIAGNOSIS — E876 Hypokalemia: Secondary | ICD-10-CM | POA: Diagnosis present

## 2019-02-19 DIAGNOSIS — F0280 Dementia in other diseases classified elsewhere without behavioral disturbance: Secondary | ICD-10-CM | POA: Diagnosis not present

## 2019-02-19 DIAGNOSIS — G319 Degenerative disease of nervous system, unspecified: Secondary | ICD-10-CM | POA: Diagnosis not present

## 2019-02-19 DIAGNOSIS — Z85038 Personal history of other malignant neoplasm of large intestine: Secondary | ICD-10-CM | POA: Diagnosis not present

## 2019-02-19 DIAGNOSIS — I6389 Other cerebral infarction: Secondary | ICD-10-CM | POA: Diagnosis not present

## 2019-02-19 DIAGNOSIS — Z9049 Acquired absence of other specified parts of digestive tract: Secondary | ICD-10-CM | POA: Diagnosis not present

## 2019-02-19 DIAGNOSIS — I089 Rheumatic multiple valve disease, unspecified: Secondary | ICD-10-CM | POA: Diagnosis not present

## 2019-02-19 DIAGNOSIS — E119 Type 2 diabetes mellitus without complications: Secondary | ICD-10-CM | POA: Diagnosis not present

## 2019-02-19 DIAGNOSIS — R001 Bradycardia, unspecified: Secondary | ICD-10-CM | POA: Diagnosis not present

## 2019-02-19 DIAGNOSIS — G460 Middle cerebral artery syndrome: Secondary | ICD-10-CM | POA: Diagnosis not present

## 2019-02-19 DIAGNOSIS — R4781 Slurred speech: Secondary | ICD-10-CM | POA: Diagnosis not present

## 2019-02-19 DIAGNOSIS — Z743 Need for continuous supervision: Secondary | ICD-10-CM | POA: Diagnosis not present

## 2019-02-19 DIAGNOSIS — R918 Other nonspecific abnormal finding of lung field: Secondary | ICD-10-CM | POA: Diagnosis not present

## 2019-02-19 DIAGNOSIS — D696 Thrombocytopenia, unspecified: Secondary | ICD-10-CM | POA: Diagnosis present

## 2019-02-19 DIAGNOSIS — R29718 NIHSS score 18: Secondary | ICD-10-CM | POA: Diagnosis not present

## 2019-02-19 DIAGNOSIS — G8191 Hemiplegia, unspecified affecting right dominant side: Secondary | ICD-10-CM | POA: Diagnosis present

## 2019-02-19 DIAGNOSIS — E1165 Type 2 diabetes mellitus with hyperglycemia: Secondary | ICD-10-CM | POA: Diagnosis present

## 2019-02-19 DIAGNOSIS — I517 Cardiomegaly: Secondary | ICD-10-CM | POA: Diagnosis not present

## 2019-02-19 DIAGNOSIS — I63412 Cerebral infarction due to embolism of left middle cerebral artery: Secondary | ICD-10-CM | POA: Diagnosis not present

## 2019-02-19 DIAGNOSIS — I6789 Other cerebrovascular disease: Secondary | ICD-10-CM | POA: Diagnosis not present

## 2019-02-25 DIAGNOSIS — E119 Type 2 diabetes mellitus without complications: Secondary | ICD-10-CM | POA: Diagnosis not present

## 2019-02-25 DIAGNOSIS — I10 Essential (primary) hypertension: Secondary | ICD-10-CM | POA: Diagnosis not present

## 2019-02-25 DIAGNOSIS — I69351 Hemiplegia and hemiparesis following cerebral infarction affecting right dominant side: Secondary | ICD-10-CM | POA: Diagnosis not present

## 2019-02-28 DIAGNOSIS — E119 Type 2 diabetes mellitus without complications: Secondary | ICD-10-CM | POA: Diagnosis not present

## 2019-02-28 DIAGNOSIS — I69351 Hemiplegia and hemiparesis following cerebral infarction affecting right dominant side: Secondary | ICD-10-CM | POA: Diagnosis not present

## 2019-02-28 DIAGNOSIS — I10 Essential (primary) hypertension: Secondary | ICD-10-CM | POA: Diagnosis not present

## 2019-03-02 DIAGNOSIS — I10 Essential (primary) hypertension: Secondary | ICD-10-CM | POA: Diagnosis not present

## 2019-03-02 DIAGNOSIS — I69351 Hemiplegia and hemiparesis following cerebral infarction affecting right dominant side: Secondary | ICD-10-CM | POA: Diagnosis not present

## 2019-03-02 DIAGNOSIS — E119 Type 2 diabetes mellitus without complications: Secondary | ICD-10-CM | POA: Diagnosis not present

## 2019-03-03 DIAGNOSIS — E119 Type 2 diabetes mellitus without complications: Secondary | ICD-10-CM | POA: Diagnosis not present

## 2019-03-03 DIAGNOSIS — I69351 Hemiplegia and hemiparesis following cerebral infarction affecting right dominant side: Secondary | ICD-10-CM | POA: Diagnosis not present

## 2019-03-03 DIAGNOSIS — I10 Essential (primary) hypertension: Secondary | ICD-10-CM | POA: Diagnosis not present

## 2019-03-07 DIAGNOSIS — Z6826 Body mass index (BMI) 26.0-26.9, adult: Secondary | ICD-10-CM | POA: Diagnosis not present

## 2019-03-07 DIAGNOSIS — I639 Cerebral infarction, unspecified: Secondary | ICD-10-CM | POA: Diagnosis not present

## 2019-03-07 DIAGNOSIS — I10 Essential (primary) hypertension: Secondary | ICD-10-CM | POA: Diagnosis not present

## 2019-03-07 DIAGNOSIS — E1165 Type 2 diabetes mellitus with hyperglycemia: Secondary | ICD-10-CM | POA: Diagnosis not present

## 2019-03-07 DIAGNOSIS — N184 Chronic kidney disease, stage 4 (severe): Secondary | ICD-10-CM | POA: Diagnosis not present

## 2019-03-07 DIAGNOSIS — E1122 Type 2 diabetes mellitus with diabetic chronic kidney disease: Secondary | ICD-10-CM | POA: Diagnosis not present

## 2019-03-07 DIAGNOSIS — Z299 Encounter for prophylactic measures, unspecified: Secondary | ICD-10-CM | POA: Diagnosis not present

## 2019-03-08 DIAGNOSIS — E119 Type 2 diabetes mellitus without complications: Secondary | ICD-10-CM | POA: Diagnosis not present

## 2019-03-08 DIAGNOSIS — I69351 Hemiplegia and hemiparesis following cerebral infarction affecting right dominant side: Secondary | ICD-10-CM | POA: Diagnosis not present

## 2019-03-08 DIAGNOSIS — I10 Essential (primary) hypertension: Secondary | ICD-10-CM | POA: Diagnosis not present

## 2019-03-10 DIAGNOSIS — I10 Essential (primary) hypertension: Secondary | ICD-10-CM | POA: Diagnosis not present

## 2019-03-10 DIAGNOSIS — E119 Type 2 diabetes mellitus without complications: Secondary | ICD-10-CM | POA: Diagnosis not present

## 2019-03-10 DIAGNOSIS — I69351 Hemiplegia and hemiparesis following cerebral infarction affecting right dominant side: Secondary | ICD-10-CM | POA: Diagnosis not present

## 2019-03-15 DIAGNOSIS — E119 Type 2 diabetes mellitus without complications: Secondary | ICD-10-CM | POA: Diagnosis not present

## 2019-03-15 DIAGNOSIS — I10 Essential (primary) hypertension: Secondary | ICD-10-CM | POA: Diagnosis not present

## 2019-03-15 DIAGNOSIS — I69351 Hemiplegia and hemiparesis following cerebral infarction affecting right dominant side: Secondary | ICD-10-CM | POA: Diagnosis not present

## 2019-03-27 DIAGNOSIS — E119 Type 2 diabetes mellitus without complications: Secondary | ICD-10-CM | POA: Diagnosis not present

## 2019-03-27 DIAGNOSIS — I10 Essential (primary) hypertension: Secondary | ICD-10-CM | POA: Diagnosis not present

## 2019-03-27 DIAGNOSIS — I69351 Hemiplegia and hemiparesis following cerebral infarction affecting right dominant side: Secondary | ICD-10-CM | POA: Diagnosis not present

## 2019-03-28 DIAGNOSIS — I69351 Hemiplegia and hemiparesis following cerebral infarction affecting right dominant side: Secondary | ICD-10-CM | POA: Diagnosis not present

## 2019-03-28 DIAGNOSIS — I10 Essential (primary) hypertension: Secondary | ICD-10-CM | POA: Diagnosis not present

## 2019-03-28 DIAGNOSIS — E119 Type 2 diabetes mellitus without complications: Secondary | ICD-10-CM | POA: Diagnosis not present

## 2019-04-04 DIAGNOSIS — I69351 Hemiplegia and hemiparesis following cerebral infarction affecting right dominant side: Secondary | ICD-10-CM | POA: Diagnosis not present

## 2019-04-06 DIAGNOSIS — E119 Type 2 diabetes mellitus without complications: Secondary | ICD-10-CM | POA: Diagnosis not present

## 2019-04-06 DIAGNOSIS — I10 Essential (primary) hypertension: Secondary | ICD-10-CM | POA: Diagnosis not present

## 2019-04-06 DIAGNOSIS — E7800 Pure hypercholesterolemia, unspecified: Secondary | ICD-10-CM | POA: Diagnosis not present

## 2019-04-11 DIAGNOSIS — F418 Other specified anxiety disorders: Secondary | ICD-10-CM | POA: Diagnosis not present

## 2019-04-11 DIAGNOSIS — E559 Vitamin D deficiency, unspecified: Secondary | ICD-10-CM | POA: Diagnosis not present

## 2019-04-11 DIAGNOSIS — Z6826 Body mass index (BMI) 26.0-26.9, adult: Secondary | ICD-10-CM | POA: Diagnosis not present

## 2019-04-11 DIAGNOSIS — Z1331 Encounter for screening for depression: Secondary | ICD-10-CM | POA: Diagnosis not present

## 2019-04-11 DIAGNOSIS — Z7189 Other specified counseling: Secondary | ICD-10-CM | POA: Diagnosis not present

## 2019-04-11 DIAGNOSIS — E7800 Pure hypercholesterolemia, unspecified: Secondary | ICD-10-CM | POA: Diagnosis not present

## 2019-04-11 DIAGNOSIS — Z1211 Encounter for screening for malignant neoplasm of colon: Secondary | ICD-10-CM | POA: Diagnosis not present

## 2019-04-11 DIAGNOSIS — E1165 Type 2 diabetes mellitus with hyperglycemia: Secondary | ICD-10-CM | POA: Diagnosis not present

## 2019-04-11 DIAGNOSIS — D509 Iron deficiency anemia, unspecified: Secondary | ICD-10-CM | POA: Diagnosis not present

## 2019-04-11 DIAGNOSIS — Z0000 Encounter for general adult medical examination without abnormal findings: Secondary | ICD-10-CM | POA: Diagnosis not present

## 2019-04-11 DIAGNOSIS — Z299 Encounter for prophylactic measures, unspecified: Secondary | ICD-10-CM | POA: Diagnosis not present

## 2019-04-11 DIAGNOSIS — Z1339 Encounter for screening examination for other mental health and behavioral disorders: Secondary | ICD-10-CM | POA: Diagnosis not present

## 2019-04-11 DIAGNOSIS — I10 Essential (primary) hypertension: Secondary | ICD-10-CM | POA: Diagnosis not present

## 2019-04-11 DIAGNOSIS — Z79899 Other long term (current) drug therapy: Secondary | ICD-10-CM | POA: Diagnosis not present

## 2019-04-15 DIAGNOSIS — E119 Type 2 diabetes mellitus without complications: Secondary | ICD-10-CM | POA: Diagnosis not present

## 2019-04-15 DIAGNOSIS — I69351 Hemiplegia and hemiparesis following cerebral infarction affecting right dominant side: Secondary | ICD-10-CM | POA: Diagnosis not present

## 2019-04-15 DIAGNOSIS — I10 Essential (primary) hypertension: Secondary | ICD-10-CM | POA: Diagnosis not present

## 2019-05-05 DIAGNOSIS — E7800 Pure hypercholesterolemia, unspecified: Secondary | ICD-10-CM | POA: Diagnosis not present

## 2019-05-05 DIAGNOSIS — I10 Essential (primary) hypertension: Secondary | ICD-10-CM | POA: Diagnosis not present

## 2019-05-05 DIAGNOSIS — E119 Type 2 diabetes mellitus without complications: Secondary | ICD-10-CM | POA: Diagnosis not present

## 2019-06-08 DIAGNOSIS — I2510 Atherosclerotic heart disease of native coronary artery without angina pectoris: Secondary | ICD-10-CM | POA: Diagnosis not present

## 2019-06-08 DIAGNOSIS — W19XXXA Unspecified fall, initial encounter: Secondary | ICD-10-CM | POA: Diagnosis not present

## 2019-06-08 DIAGNOSIS — R0902 Hypoxemia: Secondary | ICD-10-CM | POA: Diagnosis not present

## 2019-06-08 DIAGNOSIS — E119 Type 2 diabetes mellitus without complications: Secondary | ICD-10-CM | POA: Diagnosis not present

## 2019-06-08 DIAGNOSIS — I10 Essential (primary) hypertension: Secondary | ICD-10-CM | POA: Diagnosis not present

## 2019-06-08 DIAGNOSIS — N189 Chronic kidney disease, unspecified: Secondary | ICD-10-CM | POA: Diagnosis not present

## 2019-06-08 DIAGNOSIS — S3992XA Unspecified injury of lower back, initial encounter: Secondary | ICD-10-CM | POA: Diagnosis not present

## 2019-06-08 DIAGNOSIS — R52 Pain, unspecified: Secondary | ICD-10-CM | POA: Diagnosis not present

## 2019-06-08 DIAGNOSIS — E7800 Pure hypercholesterolemia, unspecified: Secondary | ICD-10-CM | POA: Diagnosis not present

## 2019-06-08 DIAGNOSIS — F0280 Dementia in other diseases classified elsewhere without behavioral disturbance: Secondary | ICD-10-CM | POA: Diagnosis not present

## 2019-06-08 DIAGNOSIS — Z885 Allergy status to narcotic agent status: Secondary | ICD-10-CM | POA: Diagnosis not present

## 2019-06-08 DIAGNOSIS — S72352A Displaced comminuted fracture of shaft of left femur, initial encounter for closed fracture: Secondary | ICD-10-CM | POA: Diagnosis not present

## 2019-06-08 DIAGNOSIS — G309 Alzheimer's disease, unspecified: Secondary | ICD-10-CM | POA: Diagnosis not present

## 2019-06-08 DIAGNOSIS — M5127 Other intervertebral disc displacement, lumbosacral region: Secondary | ICD-10-CM | POA: Diagnosis not present

## 2019-06-08 DIAGNOSIS — S72002A Fracture of unspecified part of neck of left femur, initial encounter for closed fracture: Secondary | ICD-10-CM | POA: Diagnosis not present

## 2019-06-08 DIAGNOSIS — Z79899 Other long term (current) drug therapy: Secondary | ICD-10-CM | POA: Diagnosis not present

## 2019-06-08 DIAGNOSIS — Z85038 Personal history of other malignant neoplasm of large intestine: Secondary | ICD-10-CM | POA: Diagnosis not present

## 2019-06-09 ENCOUNTER — Inpatient Hospital Stay (HOSPITAL_COMMUNITY): Payer: Medicare Other

## 2019-06-09 ENCOUNTER — Inpatient Hospital Stay (HOSPITAL_COMMUNITY): Payer: Medicare Other | Admitting: Certified Registered Nurse Anesthetist

## 2019-06-09 ENCOUNTER — Encounter (HOSPITAL_COMMUNITY): Payer: Self-pay

## 2019-06-09 ENCOUNTER — Emergency Department (HOSPITAL_COMMUNITY): Payer: Medicare Other

## 2019-06-09 ENCOUNTER — Inpatient Hospital Stay (HOSPITAL_COMMUNITY)
Admission: EM | Admit: 2019-06-09 | Discharge: 2019-06-16 | DRG: 481 | Disposition: A | Discharge status: Skilled Nursing Facility | Payer: Medicare Other | Source: Other Acute Inpatient Hospital | Attending: Internal Medicine | Admitting: Internal Medicine

## 2019-06-09 ENCOUNTER — Encounter (HOSPITAL_COMMUNITY)
Admission: EM | Disposition: A | Discharge status: Skilled Nursing Facility | Payer: Self-pay | Source: Other Acute Inpatient Hospital | Attending: Internal Medicine

## 2019-06-09 DIAGNOSIS — Z03818 Encounter for observation for suspected exposure to other biological agents ruled out: Secondary | ICD-10-CM | POA: Diagnosis not present

## 2019-06-09 DIAGNOSIS — Z8673 Personal history of transient ischemic attack (TIA), and cerebral infarction without residual deficits: Secondary | ICD-10-CM | POA: Diagnosis not present

## 2019-06-09 DIAGNOSIS — R279 Unspecified lack of coordination: Secondary | ICD-10-CM | POA: Diagnosis not present

## 2019-06-09 DIAGNOSIS — I10 Essential (primary) hypertension: Secondary | ICD-10-CM

## 2019-06-09 DIAGNOSIS — I130 Hypertensive heart and chronic kidney disease with heart failure and stage 1 through stage 4 chronic kidney disease, or unspecified chronic kidney disease: Secondary | ICD-10-CM | POA: Diagnosis present

## 2019-06-09 DIAGNOSIS — I5032 Chronic diastolic (congestive) heart failure: Secondary | ICD-10-CM | POA: Diagnosis present

## 2019-06-09 DIAGNOSIS — Y92009 Unspecified place in unspecified non-institutional (private) residence as the place of occurrence of the external cause: Secondary | ICD-10-CM

## 2019-06-09 DIAGNOSIS — R41841 Cognitive communication deficit: Secondary | ICD-10-CM | POA: Diagnosis not present

## 2019-06-09 DIAGNOSIS — E861 Hypovolemia: Secondary | ICD-10-CM | POA: Diagnosis present

## 2019-06-09 DIAGNOSIS — N179 Acute kidney failure, unspecified: Secondary | ICD-10-CM | POA: Diagnosis present

## 2019-06-09 DIAGNOSIS — M25552 Pain in left hip: Secondary | ICD-10-CM | POA: Diagnosis not present

## 2019-06-09 DIAGNOSIS — W010XXA Fall on same level from slipping, tripping and stumbling without subsequent striking against object, initial encounter: Secondary | ICD-10-CM | POA: Diagnosis present

## 2019-06-09 DIAGNOSIS — H548 Legal blindness, as defined in USA: Secondary | ICD-10-CM | POA: Diagnosis present

## 2019-06-09 DIAGNOSIS — Z85038 Personal history of other malignant neoplasm of large intestine: Secondary | ICD-10-CM

## 2019-06-09 DIAGNOSIS — N289 Disorder of kidney and ureter, unspecified: Secondary | ICD-10-CM

## 2019-06-09 DIAGNOSIS — M6281 Muscle weakness (generalized): Secondary | ICD-10-CM | POA: Diagnosis not present

## 2019-06-09 DIAGNOSIS — E119 Type 2 diabetes mellitus without complications: Secondary | ICD-10-CM

## 2019-06-09 DIAGNOSIS — H3530 Unspecified macular degeneration: Secondary | ICD-10-CM | POA: Diagnosis present

## 2019-06-09 DIAGNOSIS — I2510 Atherosclerotic heart disease of native coronary artery without angina pectoris: Secondary | ICD-10-CM | POA: Diagnosis present

## 2019-06-09 DIAGNOSIS — R0902 Hypoxemia: Secondary | ICD-10-CM | POA: Diagnosis not present

## 2019-06-09 DIAGNOSIS — D62 Acute posthemorrhagic anemia: Secondary | ICD-10-CM | POA: Diagnosis not present

## 2019-06-09 DIAGNOSIS — Z833 Family history of diabetes mellitus: Secondary | ICD-10-CM | POA: Diagnosis not present

## 2019-06-09 DIAGNOSIS — Z79899 Other long term (current) drug therapy: Secondary | ICD-10-CM | POA: Diagnosis not present

## 2019-06-09 DIAGNOSIS — R410 Disorientation, unspecified: Secondary | ICD-10-CM | POA: Diagnosis not present

## 2019-06-09 DIAGNOSIS — N183 Chronic kidney disease, stage 3 unspecified: Secondary | ICD-10-CM | POA: Diagnosis present

## 2019-06-09 DIAGNOSIS — F339 Major depressive disorder, recurrent, unspecified: Secondary | ICD-10-CM | POA: Diagnosis not present

## 2019-06-09 DIAGNOSIS — S72142D Displaced intertrochanteric fracture of left femur, subsequent encounter for closed fracture with routine healing: Secondary | ICD-10-CM | POA: Diagnosis not present

## 2019-06-09 DIAGNOSIS — E1122 Type 2 diabetes mellitus with diabetic chronic kidney disease: Secondary | ICD-10-CM | POA: Diagnosis present

## 2019-06-09 DIAGNOSIS — G309 Alzheimer's disease, unspecified: Secondary | ICD-10-CM | POA: Diagnosis not present

## 2019-06-09 DIAGNOSIS — Z8249 Family history of ischemic heart disease and other diseases of the circulatory system: Secondary | ICD-10-CM

## 2019-06-09 DIAGNOSIS — E1159 Type 2 diabetes mellitus with other circulatory complications: Secondary | ICD-10-CM | POA: Diagnosis not present

## 2019-06-09 DIAGNOSIS — I110 Hypertensive heart disease with heart failure: Secondary | ICD-10-CM | POA: Diagnosis not present

## 2019-06-09 DIAGNOSIS — S72142A Displaced intertrochanteric fracture of left femur, initial encounter for closed fracture: Principal | ICD-10-CM | POA: Diagnosis present

## 2019-06-09 DIAGNOSIS — I639 Cerebral infarction, unspecified: Secondary | ICD-10-CM | POA: Diagnosis present

## 2019-06-09 DIAGNOSIS — Z885 Allergy status to narcotic agent status: Secondary | ICD-10-CM | POA: Diagnosis not present

## 2019-06-09 DIAGNOSIS — T148XXA Other injury of unspecified body region, initial encounter: Secondary | ICD-10-CM

## 2019-06-09 DIAGNOSIS — S72352D Displaced comminuted fracture of shaft of left femur, subsequent encounter for closed fracture with routine healing: Secondary | ICD-10-CM | POA: Diagnosis not present

## 2019-06-09 DIAGNOSIS — D649 Anemia, unspecified: Secondary | ICD-10-CM

## 2019-06-09 DIAGNOSIS — I480 Paroxysmal atrial fibrillation: Secondary | ICD-10-CM | POA: Diagnosis present

## 2019-06-09 DIAGNOSIS — E782 Mixed hyperlipidemia: Secondary | ICD-10-CM | POA: Diagnosis present

## 2019-06-09 DIAGNOSIS — F0150 Vascular dementia without behavioral disturbance: Secondary | ICD-10-CM | POA: Diagnosis present

## 2019-06-09 DIAGNOSIS — Z23 Encounter for immunization: Secondary | ICD-10-CM

## 2019-06-09 DIAGNOSIS — S72002A Fracture of unspecified part of neck of left femur, initial encounter for closed fracture: Secondary | ICD-10-CM | POA: Diagnosis present

## 2019-06-09 DIAGNOSIS — M5127 Other intervertebral disc displacement, lumbosacral region: Secondary | ICD-10-CM | POA: Diagnosis not present

## 2019-06-09 DIAGNOSIS — Z9181 History of falling: Secondary | ICD-10-CM | POA: Diagnosis not present

## 2019-06-09 DIAGNOSIS — Z1159 Encounter for screening for other viral diseases: Secondary | ICD-10-CM | POA: Diagnosis not present

## 2019-06-09 DIAGNOSIS — Z7901 Long term (current) use of anticoagulants: Secondary | ICD-10-CM

## 2019-06-09 DIAGNOSIS — Z794 Long term (current) use of insulin: Secondary | ICD-10-CM | POA: Diagnosis not present

## 2019-06-09 DIAGNOSIS — Z20828 Contact with and (suspected) exposure to other viral communicable diseases: Secondary | ICD-10-CM | POA: Diagnosis present

## 2019-06-09 DIAGNOSIS — R2689 Other abnormalities of gait and mobility: Secondary | ICD-10-CM | POA: Diagnosis not present

## 2019-06-09 DIAGNOSIS — N189 Chronic kidney disease, unspecified: Secondary | ICD-10-CM | POA: Diagnosis not present

## 2019-06-09 DIAGNOSIS — Z743 Need for continuous supervision: Secondary | ICD-10-CM | POA: Diagnosis not present

## 2019-06-09 DIAGNOSIS — I509 Heart failure, unspecified: Secondary | ICD-10-CM | POA: Diagnosis not present

## 2019-06-09 DIAGNOSIS — S72009A Fracture of unspecified part of neck of unspecified femur, initial encounter for closed fracture: Secondary | ICD-10-CM

## 2019-06-09 HISTORY — PX: INTRAMEDULLARY (IM) NAIL INTERTROCHANTERIC: SHX5875

## 2019-06-09 LAB — CBC WITH DIFFERENTIAL/PLATELET
Abs Immature Granulocytes: 0.03 10*3/uL (ref 0.00–0.07)
Basophils Absolute: 0 10*3/uL (ref 0.0–0.1)
Basophils Relative: 0 %
Eosinophils Absolute: 0 10*3/uL (ref 0.0–0.5)
Eosinophils Relative: 0 %
HCT: 34.7 % — ABNORMAL LOW (ref 36.0–46.0)
Hemoglobin: 10.7 g/dL — ABNORMAL LOW (ref 12.0–15.0)
Immature Granulocytes: 0 %
Lymphocytes Relative: 17 %
Lymphs Abs: 1.8 10*3/uL (ref 0.7–4.0)
MCH: 28 pg (ref 26.0–34.0)
MCHC: 30.8 g/dL (ref 30.0–36.0)
MCV: 90.8 fL (ref 80.0–100.0)
Monocytes Absolute: 0.8 10*3/uL (ref 0.1–1.0)
Monocytes Relative: 8 %
Neutro Abs: 7.6 10*3/uL (ref 1.7–7.7)
Neutrophils Relative %: 75 %
Platelets: 160 10*3/uL (ref 150–400)
RBC: 3.82 MIL/uL — ABNORMAL LOW (ref 3.87–5.11)
RDW: 14.1 % (ref 11.5–15.5)
WBC: 10.3 10*3/uL (ref 4.0–10.5)
nRBC: 0 % (ref 0.0–0.2)

## 2019-06-09 LAB — CBC
HCT: 35.7 % — ABNORMAL LOW (ref 36.0–46.0)
Hemoglobin: 11 g/dL — ABNORMAL LOW (ref 12.0–15.0)
MCH: 28.1 pg (ref 26.0–34.0)
MCHC: 30.8 g/dL (ref 30.0–36.0)
MCV: 91.3 fL (ref 80.0–100.0)
Platelets: 163 10*3/uL (ref 150–400)
RBC: 3.91 MIL/uL (ref 3.87–5.11)
RDW: 14 % (ref 11.5–15.5)
WBC: 10.5 10*3/uL (ref 4.0–10.5)
nRBC: 0 % (ref 0.0–0.2)

## 2019-06-09 LAB — BASIC METABOLIC PANEL
Anion gap: 9 (ref 5–15)
Anion gap: 10 (ref 5–15)
BUN: 25 mg/dL — ABNORMAL HIGH (ref 8–23)
BUN: 25 mg/dL — ABNORMAL HIGH (ref 8–23)
CO2: 26 mmol/L (ref 22–32)
CO2: 26 mmol/L (ref 22–32)
Calcium: 8.7 mg/dL — ABNORMAL LOW (ref 8.9–10.3)
Calcium: 8.8 mg/dL — ABNORMAL LOW (ref 8.9–10.3)
Chloride: 104 mmol/L (ref 98–111)
Chloride: 103 mmol/L (ref 98–111)
Creatinine, Ser: 1.36 mg/dL — ABNORMAL HIGH (ref 0.44–1.00)
Creatinine, Ser: 1.32 mg/dL — ABNORMAL HIGH (ref 0.44–1.00)
GFR calc Af Amer: 40 mL/min — ABNORMAL LOW (ref >60)
GFR calc Af Amer: 42 mL/min — ABNORMAL LOW (ref >60)
GFR calc non Af Amer: 35 mL/min — ABNORMAL LOW (ref >60)
GFR calc non Af Amer: 36 mL/min — ABNORMAL LOW (ref >60)
Glucose, Bld: 215 mg/dL — ABNORMAL HIGH (ref 70–99)
Glucose, Bld: 214 mg/dL — ABNORMAL HIGH (ref 70–99)
Potassium: 5.1 mmol/L (ref 3.5–5.1)
Potassium: 4.8 mmol/L (ref 3.5–5.1)
Sodium: 139 mmol/L (ref 135–145)
Sodium: 139 mmol/L (ref 135–145)

## 2019-06-09 LAB — URINALYSIS, ROUTINE W REFLEX MICROSCOPIC
Bacteria, UA: NONE SEEN
Bilirubin Urine: NEGATIVE
Glucose, UA: 150 mg/dL — AB
Hgb urine dipstick: NEGATIVE
Ketones, ur: NEGATIVE mg/dL
Leukocytes,Ua: NEGATIVE
Nitrite: NEGATIVE
Protein, ur: 100 mg/dL — AB
Specific Gravity, Urine: 1.015 (ref 1.005–1.030)
pH: 7 (ref 5.0–8.0)

## 2019-06-09 LAB — PROTIME-INR
INR: 1.1 (ref 0.8–1.2)
Prothrombin Time: 14.3 seconds (ref 11.4–15.2)

## 2019-06-09 LAB — CBG MONITORING, ED
Glucose-Capillary: 191 mg/dL — ABNORMAL HIGH (ref 70–99)
Glucose-Capillary: 166 mg/dL — ABNORMAL HIGH (ref 70–99)
Glucose-Capillary: 135 mg/dL — ABNORMAL HIGH (ref 70–99)

## 2019-06-09 LAB — HEMOGLOBIN A1C
Hgb A1c MFr Bld: 6.9 % — ABNORMAL HIGH (ref 4.8–5.6)
Mean Plasma Glucose: 151.33 mg/dL

## 2019-06-09 LAB — SARS CORONAVIRUS 2 BY RT PCR (HOSPITAL ORDER, PERFORMED IN ~~LOC~~ HOSPITAL LAB): SARS Coronavirus 2: NEGATIVE

## 2019-06-09 LAB — GLUCOSE, CAPILLARY: Glucose-Capillary: 136 mg/dL — ABNORMAL HIGH (ref 70–99)

## 2019-06-09 LAB — ABO/RH: ABO/RH(D): A POS

## 2019-06-09 SURGERY — FIXATION, FRACTURE, INTERTROCHANTERIC, WITH INTRAMEDULLARY ROD
Anesthesia: General | Site: Hip | Laterality: Left

## 2019-06-09 MED ORDER — ONDANSETRON HCL 4 MG/2ML IJ SOLN: 4.0000 mg | Freq: Four times a day (QID) | INTRAMUSCULAR | Status: DC | PRN

## 2019-06-09 MED ORDER — DOCUSATE SODIUM 100 MG PO CAPS
100.0000 mg | ORAL_CAPSULE | Freq: Two times a day (BID) | ORAL | Status: DC
  Administered 2019-06-09 – 2019-06-16 (×14): 100 mg via ORAL
  Filled 2019-06-09 (×14): qty 1

## 2019-06-09 MED ORDER — DIPHENHYDRAMINE HCL 12.5 MG/5ML PO ELIX: 12.5000 mg | ORAL_SOLUTION | ORAL | Status: DC | PRN

## 2019-06-09 MED ORDER — VANCOMYCIN HCL 1000 MG IV SOLR
INTRAVENOUS | Status: AC
  Filled 2019-06-09: qty 1000

## 2019-06-09 MED ORDER — HEPARIN (PORCINE) 25000 UT/250ML-% IV SOLN: 900.0000 [IU]/h | INTRAVENOUS | Status: DC

## 2019-06-09 MED ORDER — METHOCARBAMOL 500 MG PO TABS
500.0000 mg | ORAL_TABLET | Freq: Four times a day (QID) | ORAL | Status: DC | PRN
  Administered 2019-06-15 – 2019-06-16 (×2): 500 mg via ORAL
  Filled 2019-06-09 (×3): qty 1

## 2019-06-09 MED ORDER — ONDANSETRON HCL 4 MG/2ML IJ SOLN
INTRAMUSCULAR | Status: DC | PRN
  Administered 2019-06-09: 4 mg via INTRAVENOUS

## 2019-06-09 MED ORDER — VANCOMYCIN HCL 500 MG IV SOLR
INTRAVENOUS | Status: AC
  Filled 2019-06-09: qty 500

## 2019-06-09 MED ORDER — POVIDONE-IODINE 10 % EX SWAB: 2.0000 "application " | Freq: Once | CUTANEOUS | Status: DC

## 2019-06-09 MED ORDER — VANCOMYCIN HCL 500 MG IV SOLR
INTRAVENOUS | Status: DC | PRN
  Administered 2019-06-09: 500 mg via TOPICAL

## 2019-06-09 MED ORDER — METHOCARBAMOL 1000 MG/10ML IJ SOLN
500.0000 mg | Freq: Four times a day (QID) | INTRAVENOUS | Status: DC | PRN
  Filled 2019-06-09: qty 5

## 2019-06-09 MED ORDER — CEFAZOLIN SODIUM-DEXTROSE 2-4 GM/100ML-% IV SOLN
2.0000 g | INTRAVENOUS | Status: AC
  Administered 2019-06-09: 16:00:00 2 g via INTRAVENOUS
  Filled 2019-06-09: qty 100

## 2019-06-09 MED ORDER — METOCLOPRAMIDE HCL 5 MG PO TABS: 5.0000 mg | ORAL_TABLET | Freq: Three times a day (TID) | ORAL | Status: DC | PRN

## 2019-06-09 MED ORDER — INSULIN ASPART 100 UNIT/ML ~~LOC~~ SOLN
0.0000 [IU] | SUBCUTANEOUS | Status: DC
  Administered 2019-06-09 (×2): 2 [IU] via SUBCUTANEOUS
  Administered 2019-06-10: 7 [IU] via SUBCUTANEOUS
  Administered 2019-06-10 (×2): 5 [IU] via SUBCUTANEOUS
  Administered 2019-06-10: 2 [IU] via SUBCUTANEOUS
  Administered 2019-06-10 (×2): 3 [IU] via SUBCUTANEOUS
  Administered 2019-06-11: 1 [IU] via SUBCUTANEOUS
  Administered 2019-06-11: 2 [IU] via SUBCUTANEOUS
  Administered 2019-06-11: 01:00:00 3 [IU] via SUBCUTANEOUS
  Administered 2019-06-11: 1 [IU] via SUBCUTANEOUS
  Administered 2019-06-12 – 2019-06-13 (×4): 2 [IU] via SUBCUTANEOUS
  Administered 2019-06-13: 1 [IU] via SUBCUTANEOUS
  Administered 2019-06-13: 3 [IU] via SUBCUTANEOUS
  Administered 2019-06-13 – 2019-06-14 (×3): 2 [IU] via SUBCUTANEOUS
  Administered 2019-06-14 (×4): 1 [IU] via SUBCUTANEOUS
  Administered 2019-06-15: 3 [IU] via SUBCUTANEOUS
  Administered 2019-06-15 (×2): 1 [IU] via SUBCUTANEOUS
  Administered 2019-06-15: 2 [IU] via SUBCUTANEOUS
  Administered 2019-06-15: 3 [IU] via SUBCUTANEOUS
  Administered 2019-06-16: 2 [IU] via SUBCUTANEOUS
  Administered 2019-06-16: 1 [IU] via SUBCUTANEOUS
  Administered 2019-06-16 (×2): 2 [IU] via SUBCUTANEOUS

## 2019-06-09 MED ORDER — MORPHINE SULFATE (PF) 4 MG/ML IV SOLN
4.0000 mg | INTRAVENOUS | Status: DC | PRN
  Administered 2019-06-09: 4 mg via INTRAVENOUS
  Filled 2019-06-09: qty 1

## 2019-06-09 MED ORDER — AMLODIPINE BESYLATE 5 MG PO TABS
5.0000 mg | ORAL_TABLET | Freq: Every day | ORAL | Status: DC
  Administered 2019-06-09 – 2019-06-16 (×6): 5 mg via ORAL
  Filled 2019-06-09 (×8): qty 1

## 2019-06-09 MED ORDER — LACTATED RINGERS IV SOLN
INTRAVENOUS | Status: DC
  Administered 2019-06-09: 15:00:00 via INTRAVENOUS

## 2019-06-09 MED ORDER — SODIUM CHLORIDE 0.9 % IV SOLN
INTRAVENOUS | Status: DC | PRN
  Administered 2019-06-09: 16:00:00 via INTRAVENOUS

## 2019-06-09 MED ORDER — MORPHINE SULFATE (PF) 2 MG/ML IV SOLN: 1.0000 mg | INTRAVENOUS | Status: DC | PRN

## 2019-06-09 MED ORDER — METOCLOPRAMIDE HCL 5 MG/ML IJ SOLN: 5.0000 mg | Freq: Three times a day (TID) | INTRAMUSCULAR | Status: DC | PRN

## 2019-06-09 MED ORDER — POLYETHYLENE GLYCOL 3350 17 G PO PACK
17.0000 g | PACK | Freq: Every day | ORAL | Status: DC | PRN
  Administered 2019-06-11 – 2019-06-15 (×3): 17 g via ORAL
  Filled 2019-06-09 (×3): qty 1

## 2019-06-09 MED ORDER — FENTANYL CITRATE (PF) 250 MCG/5ML IJ SOLN
INTRAMUSCULAR | Status: AC
  Filled 2019-06-09: qty 5

## 2019-06-09 MED ORDER — FENTANYL CITRATE (PF) 100 MCG/2ML IJ SOLN
INTRAMUSCULAR | Status: DC | PRN
  Administered 2019-06-09: 75 ug via INTRAVENOUS
  Administered 2019-06-09: 50 ug via INTRAVENOUS
  Administered 2019-06-09: 25 ug via INTRAVENOUS

## 2019-06-09 MED ORDER — TRAMADOL HCL 50 MG PO TABS
50.0000 mg | ORAL_TABLET | Freq: Four times a day (QID) | ORAL | Status: DC | PRN
  Administered 2019-06-10 – 2019-06-15 (×6): 50 mg via ORAL
  Filled 2019-06-09 (×7): qty 1

## 2019-06-09 MED ORDER — GABAPENTIN 100 MG PO CAPS
100.0000 mg | ORAL_CAPSULE | Freq: Three times a day (TID) | ORAL | Status: DC
  Administered 2019-06-09 – 2019-06-16 (×20): 100 mg via ORAL
  Filled 2019-06-09 (×21): qty 1

## 2019-06-09 MED ORDER — ONDANSETRON HCL 4 MG/2ML IJ SOLN
4.0000 mg | Freq: Four times a day (QID) | INTRAMUSCULAR | Status: DC | PRN
  Administered 2019-06-09: 4 mg via INTRAVENOUS
  Filled 2019-06-09: qty 2

## 2019-06-09 MED ORDER — CEFAZOLIN SODIUM-DEXTROSE 2-4 GM/100ML-% IV SOLN
2.0000 g | Freq: Three times a day (TID) | INTRAVENOUS | Status: AC
  Administered 2019-06-09 – 2019-06-10 (×3): 2 g via INTRAVENOUS
  Filled 2019-06-09 (×3): qty 100

## 2019-06-09 MED ORDER — 0.9 % SODIUM CHLORIDE (POUR BTL) OPTIME
TOPICAL | Status: DC | PRN
  Administered 2019-06-09: 1000 mL

## 2019-06-09 MED ORDER — ACETAMINOPHEN 325 MG PO TABS
650.0000 mg | ORAL_TABLET | Freq: Four times a day (QID) | ORAL | Status: DC
  Administered 2019-06-09 – 2019-06-16 (×27): 650 mg via ORAL
  Filled 2019-06-09 (×28): qty 2

## 2019-06-09 MED ORDER — ATORVASTATIN CALCIUM 10 MG PO TABS
10.0000 mg | ORAL_TABLET | Freq: Every day | ORAL | Status: DC
  Administered 2019-06-10 – 2019-06-15 (×6): 10 mg via ORAL
  Filled 2019-06-09 (×6): qty 1

## 2019-06-09 MED ORDER — PROPOFOL 10 MG/ML IV BOLUS
INTRAVENOUS | Status: AC
  Filled 2019-06-09: qty 20

## 2019-06-09 MED ORDER — ONDANSETRON HCL 4 MG PO TABS: 4.0000 mg | ORAL_TABLET | Freq: Four times a day (QID) | ORAL | Status: DC | PRN

## 2019-06-09 MED ORDER — SUGAMMADEX SODIUM 200 MG/2ML IV SOLN
INTRAVENOUS | Status: DC | PRN
  Administered 2019-06-09: 145.2 mg via INTRAVENOUS

## 2019-06-09 MED ORDER — FENTANYL CITRATE (PF) 100 MCG/2ML IJ SOLN: 25.0000 ug | INTRAMUSCULAR | Status: DC | PRN

## 2019-06-09 MED ORDER — ALBUMIN HUMAN 5 % IV SOLN
INTRAVENOUS | Status: DC | PRN
  Administered 2019-06-09: 16:00:00 via INTRAVENOUS

## 2019-06-09 MED ORDER — VECURONIUM BROMIDE 10 MG IV SOLR
INTRAVENOUS | Status: DC | PRN
  Administered 2019-06-09: 5 mg via INTRAVENOUS

## 2019-06-09 MED ORDER — PROPOFOL 10 MG/ML IV BOLUS
INTRAVENOUS | Status: DC | PRN
  Administered 2019-06-09: 100 mg via INTRAVENOUS

## 2019-06-09 MED ORDER — SODIUM CHLORIDE 0.9 % IV SOLN
INTRAVENOUS | Status: DC | PRN
  Administered 2019-06-09: 16:00:00 50 ug/min via INTRAVENOUS

## 2019-06-09 MED ORDER — LIDOCAINE HCL (CARDIAC) PF 100 MG/5ML IV SOSY
PREFILLED_SYRINGE | INTRAVENOUS | Status: DC | PRN
  Administered 2019-06-09: 30 mg via INTRAVENOUS

## 2019-06-09 MED ORDER — SODIUM CHLORIDE 0.9 % IV SOLN
INTRAVENOUS | Status: AC
  Administered 2019-06-09: 04:00:00 via INTRAVENOUS

## 2019-06-09 MED ORDER — CHLORHEXIDINE GLUCONATE 4 % EX LIQD: 60.0000 mL | Freq: Once | CUTANEOUS | Status: DC

## 2019-06-09 SURGICAL SUPPLY — 51 items
ADH SKN CLS APL DERMABOND .7 (GAUZE/BANDAGES/DRESSINGS)
APL PRP STRL LF DISP 70% ISPRP (MISCELLANEOUS) ×1
BIT DRILL 4.2 (DRILL) IMPLANT
BLADE HELICAL TFNA 100 NSTRL (Anchor) ×2 IMPLANT
BRUSH SCRUB EZ PLAIN DRY (MISCELLANEOUS) ×6 IMPLANT
CHLORAPREP W/TINT 26 (MISCELLANEOUS) ×3 IMPLANT
CLOSURE WOUND 1/2 X4 (GAUZE/BANDAGES/DRESSINGS) ×3
COVER PERINEAL POST (MISCELLANEOUS) ×3 IMPLANT
COVER SURGICAL LIGHT HANDLE (MISCELLANEOUS) ×3 IMPLANT
DERMABOND ADVANCED (GAUZE/BANDAGES/DRESSINGS)
DERMABOND ADVANCED .7 DNX12 (GAUZE/BANDAGES/DRESSINGS) ×1 IMPLANT
DRAPE C-ARM 35X43 STRL (DRAPES) ×3 IMPLANT
DRAPE C-ARM 42X72 X-RAY (DRAPES) ×2 IMPLANT
DRAPE IMP U-DRAPE 54X76 (DRAPES) ×6 IMPLANT
DRAPE INCISE IOBAN 66X45 STRL (DRAPES) ×3 IMPLANT
DRAPE STERI IOBAN 125X83 (DRAPES) ×3 IMPLANT
DRAPE SURG 17X23 STRL (DRAPES) ×6 IMPLANT
DRAPE U-SHAPE 47X51 STRL (DRAPES) ×3 IMPLANT
DRILL 4.2 (DRILL) ×3
DRSG MEPILEX BORDER 4X4 (GAUZE/BANDAGES/DRESSINGS) ×11 IMPLANT
DRSG MEPILEX BORDER 4X8 (GAUZE/BANDAGES/DRESSINGS) ×3 IMPLANT
ELECT REM PT RETURN 9FT ADLT (ELECTROSURGICAL) ×3
ELECTRODE REM PT RTRN 9FT ADLT (ELECTROSURGICAL) ×1 IMPLANT
GLOVE BIO SURGEON STRL SZ 6.5 (GLOVE) ×6 IMPLANT
GLOVE BIO SURGEON STRL SZ7.5 (GLOVE) ×12 IMPLANT
GLOVE BIO SURGEONS STRL SZ 6.5 (GLOVE) ×3
GLOVE BIOGEL PI IND STRL 6.5 (GLOVE) ×1 IMPLANT
GLOVE BIOGEL PI IND STRL 7.5 (GLOVE) ×1 IMPLANT
GLOVE BIOGEL PI INDICATOR 6.5 (GLOVE) ×2
GLOVE BIOGEL PI INDICATOR 7.5 (GLOVE) ×2
GOWN STRL REUS W/ TWL LRG LVL3 (GOWN DISPOSABLE) ×1 IMPLANT
GOWN STRL REUS W/TWL LRG LVL3 (GOWN DISPOSABLE) ×9
GUIDEWIRE 3.2X400 (WIRE) ×4 IMPLANT
KIT BASIN OR (CUSTOM PROCEDURE TRAY) ×3 IMPLANT
KIT TURNOVER KIT B (KITS) ×3 IMPLANT
MANIFOLD NEPTUNE II (INSTRUMENTS) ×1 IMPLANT
NAIL 10/130 TI CAN TFNA 360HIP (Nail) ×2 IMPLANT
NS IRRIG 1000ML POUR BTL (IV SOLUTION) ×3 IMPLANT
PACK GENERAL/GYN (CUSTOM PROCEDURE TRAY) ×3 IMPLANT
PAD ARMBOARD 7.5X6 YLW CONV (MISCELLANEOUS) ×6 IMPLANT
REAMER ROD DEEP FLUTE 2.5X950 (INSTRUMENTS) ×2 IMPLANT
SCREW LOCK STAR 5X40 (Screw) ×2 IMPLANT
STRIP CLOSURE SKIN 1/2X4 (GAUZE/BANDAGES/DRESSINGS) ×3 IMPLANT
SUT MNCRL AB 3-0 PS2 18 (SUTURE) ×3 IMPLANT
SUT VIC AB 0 CT1 27 (SUTURE)
SUT VIC AB 0 CT1 27XBRD ANBCTR (SUTURE) IMPLANT
SUT VIC AB 2-0 CT1 27 (SUTURE) ×3
SUT VIC AB 2-0 CT1 TAPERPNT 27 (SUTURE) ×2 IMPLANT
TOWEL GREEN STERILE (TOWEL DISPOSABLE) ×6 IMPLANT
TRAY FOLEY W/BAG SLVR 14FR (SET/KITS/TRAYS/PACK) IMPLANT
WATER STERILE IRR 1000ML POUR (IV SOLUTION) ×3 IMPLANT

## 2019-06-09 NOTE — ED Notes (Signed)
Breakfast Ordered 

## 2019-06-09 NOTE — ED Notes (Addendum)
Pt declined to have blood sugar taken at this time, appears confused, will continue to monitor

## 2019-06-09 NOTE — Op Note (Signed)
Orthopaedic Surgery Operative Note (CSN: [id number] ) Date of Surgery: 06/09/2019  Admit Date: 06/09/2019   Diagnoses: Pre-Op Diagnoses: Left intertrochanteric femur fracture  Post-Op Diagnosis: Same  Procedures: CPT 27245-Cephalomedullary nailing of left intertrochanteric femur fracture  Surgeons : Primary: Shona Needles, MD  Assistant: Patrecia Pace, PA-C  Location: OR 5   Anesthesia:General  Antibiotics: Ancef 2g preop   Tourniquet time:None  Estimated Blood WPYK:998 mL  Complications:None   Specimens:None   Implants: Implant Name Type Inv. Item Serial No. Manufacturer Lot No. LRB No. Used Action  NAIL 10/130 TI CAN TFNA 360HIP - [id number] Nail NAIL 10/130 TI CAN TFNA 360HIP  SYNTHES [employer] [id number] Left 1 Implanted  BLADE HELICAL TFNA 379 NSTRL - [id number] Anchor BLADE HELICAL TFNA 299 NSTRL  SYNTH[employer]  Left 1 Implanted  SCREW LOCK STAR 5X40 - [id number] Screw SCREW LOCK STAR 5X40  SYNTHES TRAUMA  Left 1 Implanted     Indications for Surgery: 85 year old female with atrial fibrillation on anticoagulation with a direct thrombin inhibitor presents after a ground-level fall with a left intertrochanteric femur fracture.  She also has a history of some cognitive impairment.  She also has a diabetes.  She was seen at [facility] and transferred here for evaluation and treatment.  I recommended surgical fixation of her hip fracture.  I discussed risks and benefits with the patient.  Risks included but not limited to bleeding, infection, malunion, nonunion, hardware failure, hardware irritation, need for revision surgery, nerve and blood vessel injury, even the possibility of anesthetic complications.  She agreed to proceed with surgery and consent was obtained.  Operative Findings: Cephalomedullary nailing of left intertrochanteric femur fracture using Synthes TFN 360x74mm nail with 622WL helical blade  Procedure: The patient was identified in the preoperative holding  area. Consent was confirmed with the patient and their family and all questions were answered. The operative extremity was marked after confirmation with the patient and they were then brought back to the operating room by our anesthesia colleagues. The patient was placed under general anesthesia and then carefully transferred over to a Hana table. The feet were secured into a traction boot and well padded. A post was placed in the groin and traction was pulled on the operative leg. The contralateral leg was positioned out of the way of fluoroscopy and secure . Fluoroscopic images were obtained and traction and manipulation was performed to reduce the fracture. Once adequate reduction was performed then the operative extremity was prepped and draped in sterile fashion. Preincision timeout was performed to verify the patient, the procedure and the extremity. Preoperative antibiotics were dosed.  A small incision was made proximal to the greater trochanter. A curved Mayo scissors was used to spread down to the greater trochanter in line with the abductor musculature. A threaded guidepin was positioned at an appropriate starting point on the AP and lateral views. It was advanced in the femur past the lesser trochanter. A entry reamer with soft tissue protector was then used to enter the canal. A ball tipped guidewire was passed down the center of the canal. It was measure and 335mm nail was chosen. A placed a 30mm reamer down the canal and had minimal chatter and chose to place a 52mm nail and this was passed down the medullary canal. The targeting arm for the helical blade was attached. A percutaneous incision was made for the guide for the helical blade. A threaded guidepin was placed into the femoral neck and head and  fluoroscopy was used to confirm adequate placement with an acceptable tip-apex distance. A drill was used to perforate the lateral cortex and 252 mm helical blade was inserted into the head/neck  segment. The set screw was then tightened to set rotation and backed off to allow compression. The aiming arm was removed from the nail and position of the helical blade was confirmed with fluoroscopy. Using perfect circle technique a distal interlock was placed.  Final fluoroscopic images were obtained and the incisions were copiously irrigated. The skin was closed with 2-0 vicryl, 3-0 monocryl and sealed with dermabond. The incisions were dressing with Mepilex dressings. The patient was carefully transferred to the regular floor bed and was taken to PACU in stable condition.  Post Op Plan/Instructions: Patient will be weightbearing as tolerated. Ancef for surgical prophylaxis. May restart anticoagulation POD 1 if hemoglobin is stable.  I was present and performed the entire surgery.  Patrecia Pace, PA-C did assist me throughout the case. An assistant was necessary given the difficulty in approach, maintenance of reduction and ability to instrument the fracture.   Katha Hamming, MD [facility]

## 2019-06-09 NOTE — ED Notes (Signed)
Pt is oriented to person, place, and situation at this time

## 2019-06-09 NOTE — Transfer of Care (Signed)
Immediate Anesthesia Transfer of Care Note  Patient: Laura Parks  Procedure(s) Performed: INTRAMEDULLARY (IM) NAIL INTERTROCHANTRIC (Left Hip)  Patient Location: PACU  Anesthesia Type:General  Level of Consciousness: awake and alert   Airway & Oxygen Therapy: Patient Spontanous Breathing and Patient connected to nasal cannula oxygen  Post-op Assessment: Report given to RN and Post -op Vital signs reviewed and stable  Post vital signs: Reviewed  Last Vitals:  Vitals Value Taken Time  BP 126/65 06/09/19 1703  Temp 37 C 06/09/19 1700  Pulse 72 06/09/19 1711  Resp 22 06/09/19 1711  SpO2 100 % 06/09/19 1711  Vitals shown include unvalidated device data.  Last Pain:  Vitals:   06/09/19 1327  TempSrc: Oral  PainSc:          Complications: No apparent anesthesia complications

## 2019-06-09 NOTE — Progress Notes (Signed)
ANTICOAGULATION CONSULT NOTE - Initial Consult  Pharmacy Consult for Heparin Indication: atrial fibrillation  Allergies  Allergen Reactions  . Codeine Nausea Only    Patient Measurements: Height: 5\' 2"  (157.5 cm) Weight: 160 lb (72.6 kg) IBW/kg (Calculated) : 50.1 Heparin Dosing Weight: 65kg  Vital Signs: Temp: 97.5 F (36.4 C) (09/24 0329) Temp Source: Oral (09/24 0329) BP: 178/64 (09/24 0329) Pulse Rate: 58 (09/24 0329)  Labs: Recent Labs    06/09/19 0318  HGB 10.7*  HCT 34.7*  PLT 160  LABPROT 14.3  INR 1.1  CREATININE 1.36*    Estimated Creatinine Clearance: 27.2 mL/min (A) (by C-G formula based on SCr of 1.36 mg/dL (H)).   Medical History: Past Medical History:  Diagnosis Date  . Anemia   . Cognitive impairment   . Colon cancer ([place])   . Coronary artery disease    Minimal coronary atherosclerosis at cardiac catheterization April 2017  . Essential hypertension, benign   . History of pneumonia   . Iron deficiency anemia    Erosive antral gastritis  . Legal blindness   . Macular degeneration   . Mixed hyperlipidemia   . PAF (paroxysmal atrial fibrillation) ([place])   . Stroke [facility]) 01/2016   [place] infract  . Type 2 diabetes mellitus (HCC)    Assessment: 70 YOF transfer from [facility] with L-femur fracture.  On apixaban PTA for Afib, last dose 9/23 @PM  (Pt cannot recall timing of last dose).  Chronic anemia stable, plts 160.  Possible surgery 9/26.    Goal of Therapy:  Heparin level 0.3-0.7 units/ml aPTT 66-102 seconds Monitor platelets by anticoagulation protocol: Yes   Plan:  Heparin gtt at 900 units/hr ~12h from last apixaban dose, no bolus F/u 8 hour aPTT/HL F/u surgery plans in AM  Bertis Ruddy, PharmD Clinical Pharmacist Please check AMION for all [place] numbers 06/09/2019 4:49 AM

## 2019-06-09 NOTE — ED Notes (Signed)
MS 

## 2019-06-09 NOTE — ED Triage Notes (Signed)
Pt comes via [facility] from [place] for fall tonight with L hip fracture, PTA received 8 mg morphine.

## 2019-06-09 NOTE — Anesthesia Procedure Notes (Signed)
Procedure Name: Intubation Date/Time: 06/09/2019 3:32 PM Performed by: Eligha Bridegroom, CRNA Pre-anesthesia Checklist: Patient identified, Emergency Drugs available, Suction available, Patient being monitored and Timeout performed Patient Re-evaluated:Patient Re-evaluated prior to induction Oxygen Delivery Method: Circle system utilized Preoxygenation: Pre-oxygenation with 100% oxygen Induction Type: IV induction Ventilation: Mask ventilation without difficulty Laryngoscope Size: Mac and 3 Grade View: Grade I Tube type: Oral Tube size: 7.0 mm Number of attempts: 1 Airway Equipment and Method: Stylet Placement Confirmation: ETT inserted through vocal cords under direct vision,  positive ETCO2 and breath sounds checked- equal and bilateral Secured at: 21 cm Tube secured with: Tape Dental Injury: Teeth and Oropharynx as per pre-operative assessment

## 2019-06-09 NOTE — Consult Note (Signed)
Reason for Consult:Left hip fx Referring Physician: [name] is an 85 y.o. female.  HPI: Laura Parks was walking in her home and maneuvering around her bed when somehow she tripped and fell. She had immediate pain and could not get up. She was brought to the ED where x-rays showed a left hip fx and orthopedic surgery was consulted. She c/o localized pain to the hip.  Past Medical History:  Diagnosis Date  . Anemia   . Cognitive impairment   . Colon cancer ([place])   . Coronary artery disease    Minimal coronary atherosclerosis at cardiac catheterization April 2017  . Essential hypertension, benign   . History of pneumonia   . Iron deficiency anemia    Erosive antral gastritis  . Legal blindness   . Macular degeneration   . Mixed hyperlipidemia   . PAF (paroxysmal atrial fibrillation) ([place])   . Stroke [facility]) 01/2016   [place] infract  . Type 2 diabetes mellitus ([place])     Past Surgical History:  Procedure Laterality Date  . APPENDECTOMY    . CARDIAC CATHETERIZATION N/A 01/04/2016   Procedure: Left Heart Cath and Coronary Angiography;  Surgeon: Leonie Man, MD;  Location: [place] CV LAB;  Service: Cardiovascular;  Laterality: N/A;  . CATARACT EXTRACTION    . CHOLECYSTECTOMY    . Colon surgery for colon cancer     1992 by Dr Mendel Ryder  . COLONOSCOPY  03/24/2012   Procedure: COLONOSCOPY;  Surgeon: Rogene Houston, MD;  [facility];  Service: Endoscopy;  Laterality: N/A;  1200  . COLONOSCOPY N/A 07/03/2016   Procedure: COLONOSCOPY;  Surgeon: Rogene Houston, MD;  [facility];  Service: Endoscopy;  Laterality: N/A;  200  . ESOPHAGOGASTRODUODENOSCOPY  08/04/2012   Procedure: ESOPHAGOGASTRODUODENOSCOPY (EGD);  Surgeon: Rogene Houston, MD;  [facility];  Service: Endoscopy;  Laterality: N/A;  325  . Right knee surgery    . TEE WITHOUT CARDIOVERSION N/A 02/01/2016   Procedure: TRANSESOPHAGEAL ECHOCARDIOGRAM (TEE);  Surgeon: Jerline Pain,  MD;  Location: [facility] ENDOSCOPY;  Service: Cardiovascular;  Laterality: N/A;  . TUBAL LIGATION      Family History  Problem Relation Age of Onset  . COPD Mother   . Alzheimer's disease Mother   . Congestive Heart Failure Mother   . Diabetes Brother     Social History:  reports that she has never smoked. She has never used smokeless tobacco. She reports that she does not drink alcohol or use drugs.  Allergies:  Allergies  Allergen Reactions  . Codeine Nausea Only    Medications: I have reviewed the patient's current medications.  Results for orders placed or performed during the hospital encounter of 06/09/19 (from the past 48 hour(s))  Basic metabolic panel     Status: Abnormal   Collection Time: 06/09/19  3:18 AM  Result Value Ref Range   Sodium 139 135 - 145 mmol/L   Potassium 5.1 3.5 - 5.1 mmol/L   Chloride 104 98 - 111 mmol/L   CO2 26 22 - 32 mmol/L   Glucose, Bld 215 (H) 70 - 99 mg/dL   BUN 25 (H) 8 - 23 mg/dL   Creatinine, Ser 1.36 (H) 0.44 - 1.00 mg/dL   Calcium 8.7 (L) 8.9 - 10.3 mg/dL   GFR calc non Af Amer 35 (L) >60 mL/min   GFR calc Af Amer 40 (L) >60 mL/min   Anion gap 9 5 - 15  Comment: Performed at [facility], [place] [address] [place], [place] [address]  CBC WITH DIFFERENTIAL     Status: Abnormal   Collection Time: 06/09/19  3:18 AM  Result Value Ref Range   WBC 10.3 4.0 - 10.5 K/uL   RBC 3.82 (L) 3.87 - 5.11 MIL/uL   Hemoglobin 10.7 (L) 12.0 - 15.0 g/dL   HCT 34.7 (L) 36.0 - 46.0 %   MCV 90.8 80.0 - 100.0 fL   MCH 28.0 26.0 - 34.0 pg   MCHC 30.8 30.0 - 36.0 g/dL   RDW 14.1 11.5 - 15.5 %   Platelets 160 150 - 400 K/uL   nRBC 0.0 0.0 - 0.2 %   Neutrophils Relative % 75 %   Neutro Abs 7.6 1.7 - 7.7 K/uL   Lymphocytes Relative 17 %   Lymphs Abs 1.8 0.7 - 4.0 K/uL   Monocytes Relative 8 %   Monocytes Absolute 0.8 0.1 - 1.0 K/uL   Eosinophils Relative 0 %   Eosinophils Absolute 0.0 0.0 - 0.5 K/uL   Basophils Relative 0 %   Basophils Absolute  0.0 0.0 - 0.1 K/uL   Immature Granulocytes 0 %   Abs Immature Granulocytes 0.03 0.00 - 0.07 K/uL    Comment: Performed at [facility], [address], [place], [place] [address]  Protime-INR     Status: None   Collection Time: 06/09/19  3:18 AM  Result Value Ref Range   Prothrombin Time 14.3 11.4 - 15.2 seconds   INR 1.1 0.8 - 1.2    Comment: (NOTE) INR goal varies based on device and disease states. Performed at [facility], [place] [address] [place], [place] [address]   Urinalysis, Routine w reflex microscopic     Status: Abnormal   Collection Time: 06/09/19  3:18 AM  Result Value Ref Range   Color, Urine YELLOW YELLOW   APPearance CLEAR CLEAR   Specific Gravity, Urine 1.015 1.005 - 1.030   pH 7.0 5.0 - 8.0   Glucose, UA 150 (A) NEGATIVE mg/dL   Hgb urine dipstick NEGATIVE NEGATIVE   Bilirubin Urine NEGATIVE NEGATIVE   Ketones, ur NEGATIVE NEGATIVE mg/dL   Protein, ur 100 (A) NEGATIVE mg/dL   Nitrite NEGATIVE NEGATIVE   Leukocytes,Ua NEGATIVE NEGATIVE   RBC / HPF 0-5 0 - 5 RBC/hpf   WBC, UA 0-5 0 - 5 WBC/hpf   Bacteria, UA NONE SEEN NONE SEEN    Comment: Performed at [facility] [address] [place], [place] [address]  Type and screen [facility]     Status: None   Collection Time: 06/09/19  3:21 AM  Result Value Ref Range   ABO/RH(D) A POS    Antibody Screen NEG    Sample Expiration      06/12/2019,2359 Performed at [facility], [place] [address] [place], [place] [address]   ABO/Rh     Status: None (Preliminary result)   Collection Time: 06/09/19  3:21 AM  Result Value Ref Range   ABO/RH(D)      A POS Performed at [facility] [address], [place], [place] [address]   SARS Coronavirus 2 [facility] order, Performed in [facility] hospital lab) Nasopharyngeal Nasopharyngeal Swab     Status: None   Collection Time: 06/09/19  3:24 AM   Specimen: Nasopharyngeal Swab  Result Value Ref Range   SARS Coronavirus 2  NEGATIVE NEGATIVE    Comment: (NOTE) If  result is NEGATIVE SARS-CoV-2 target nucleic acids are NOT DETECTED. The SARS-CoV-2 RNA is generally detectable in upper and lower  respiratory specimens during the acute phase of infection. The lowest  concentration of SARS-CoV-2 viral copies this assay can detect is 250  copies / mL. A negative result does not preclude SARS-CoV-2 infection  and should not be used as the sole basis for treatment or other  patient management decisions.  A negative result may occur with  improper specimen collection / handling, submission of specimen other  than nasopharyngeal swab, presence of viral mutation(s) within the  areas targeted by this assay, and inadequate number of viral copies  (<250 copies / mL). A negative result must be combined with clinical  observations, patient history, and epidemiological information. If result is POSITIVE SARS-CoV-2 target nucleic acids are DETECTED. The SARS-CoV-2 RNA is generally detectable in upper and lower  respiratory specimens dur ing the acute phase of infection.  Positive  results are indicative of active infection with SARS-CoV-2.  Clinical  correlation with patient history and other diagnostic information is  necessary to determine patient infection status.  Positive results do  not rule out bacterial infection or co-infection with other viruses. If result is PRESUMPTIVE POSTIVE SARS-CoV-2 nucleic acids MAY BE PRESENT.   A presumptive positive result was obtained on the submitted specimen  and confirmed on repeat testing.  While 2019 novel coronavirus  (SARS-CoV-2) nucleic acids may be present in the submitted sample  additional confirmatory testing may be necessary for epidemiological  and / or clinical management purposes  to differentiate between  SARS-CoV-2 and other Sarbecovirus currently known to infect humans.  If clinically indicated additional testing with an alternate test  methodology [phone]) is  advised. The SARS-CoV-2 RNA is generally  detectable in upper and lower respiratory sp ecimens during the acute  phase of infection. The expected result is Negative. Fact Sheet for Patients:  StrictlyIdeas.no Fact Sheet for Healthcare Providers: BankingDealers.co.za This test is not yet approved or cleared by the [place] FDA and has been authorized for detection and/or diagnosis of SARS-CoV-2 by FDA under an Emergency Use Authorization (EUA).  This EUA will remain in effect (meaning this test can be used) for the duration of the COVID-19 declaration under Section 564(b)(1) of the Act, 21 U.S.C. section 360bbb-3(b)(1), unless the authorization is terminated or revoked sooner. Performed at [facility], [place] [address] [place], [place] [address]   CBG monitoring, ED     Status: Abnormal   Collection Time: 06/09/19  4:50 AM  Result Value Ref Range   Glucose-Capillary 191 (H) 70 - 99 mg/dL  Hemoglobin A1c     Status: Abnormal   Collection Time: 06/09/19  4:52 AM  Result Value Ref Range   Hgb A1c MFr Bld 6.9 (H) 4.8 - 5.6 %    Comment: (NOTE) Pre diabetes:          5.7%-6.4% Diabetes:              >6.4% Glycemic control for   <7.0% adults with diabetes    Mean Plasma Glucose 151.33 mg/dL    Comment: Performed at [facility] [address], [place] [address]  CBC     Status: Abnormal   Collection Time: 06/09/19  4:52 AM  Result Value Ref Range   WBC 10.5 4.0 - 10.5 K/uL   RBC 3.91 3.87 - 5.11 MIL/uL   Hemoglobin 11.0 (L) 12.0 - 15.0 g/dL   HCT  35.7 (L) 36.0 - 46.0 %   MCV 91.3 80.0 - 100.0 fL   MCH 28.1 26.0 - 34.0 pg   MCHC 30.8 30.0 - 36.0 g/dL   RDW 14.0 11.5 - 15.5 %   Platelets 163 150 - 400 K/uL   nRBC 0.0 0.0 - 0.2 %    Comment: Performed at [facility], [place] [address], [place], [place] [address]  Basic metabolic panel     Status: Abnormal   Collection Time: 06/09/19  4:52 AM  Result  Value Ref Range   Sodium 139 135 - 145 mmol/L   Potassium 4.8 3.5 - 5.1 mmol/L   Chloride 103 98 - 111 mmol/L   CO2 26 22 - 32 mmol/L   Glucose, Bld 214 (H) 70 - 99 mg/dL   BUN 25 (H) 8 - 23 mg/dL   Creatinine, Ser 1.32 (H) 0.44 - 1.00 mg/dL   Calcium 8.8 (L) 8.9 - 10.3 mg/dL   GFR calc non Af Amer 36 (L) >60 mL/min   GFR calc Af Amer 42 (L) >60 mL/min   Anion gap 10 5 - 15    Comment: Performed at [facility] [address], [place], [place] [address]  CBG monitoring, ED     Status: Abnormal   Collection Time: 06/09/19  8:15 AM  Result Value Ref Range   Glucose-Capillary 166 (H) 70 - 99 mg/dL    Dg Chest Port 1 View  Result Date: 06/09/2019 CLINICAL DATA:  85 year old female with preop exam. EXAM: PORTABLE CHEST 1 VIEW COMPARISON:  Chest radiograph dated 10/30/2017 FINDINGS: Mild diffuse interstitial coarsening and chronic bronchitic changes. No focal consolidation, pleural effusion, pneumothorax. Mild cardiomegaly. Calcification of the mitral annulus. Atherosclerotic calcification of the aorta. Osteopenia with degenerative changes of the spine. No acute osseous pathology. IMPRESSION: 1. No acute cardiopulmonary process. 2. Mild cardiomegaly. Electronically Signed   By: Anner Crete M.D.   On: 06/09/2019 03:32   Dg Hip Unilat With Pelvis 2-3 Views Left  Result Date: 06/09/2019 CLINICAL DATA:  Left hip fracture EXAM: DG HIP (WITH OR WITHOUT PELVIS) 2-3V LEFT COMPARISON:  CT 06/08/2019 FINDINGS: Redemonstration of a mildly displaced fracture of the proximal left femur with intertrochanteric component. Similar degree of angulation and foreshortening. Alignment unchanged. No involvement of the femoral head articular surface. No dislocation. Bones demineralized. No additional fractures. Lower lumbar spondylosis. Vascular calcifications. IMPRESSION: No significant change in appearance or alignment of comminuted intertrochanteric proximal left femur fracture. Electronically Signed    By: Davina Poke M.D.   On: 06/09/2019 08:03    Review of Systems  Constitutional: Negative for weight loss.  HENT: Negative for ear discharge, ear pain, hearing loss and tinnitus.   Eyes: Negative for blurred vision, double vision, photophobia and pain.  Respiratory: Negative for cough, sputum production and shortness of breath.   Cardiovascular: Negative for chest pain.  Gastrointestinal: Negative for abdominal pain, nausea and vomiting.  Genitourinary: Negative for dysuria, flank pain, frequency and urgency.  Musculoskeletal: Positive for joint pain (Left hip). Negative for back pain, falls, myalgias and neck pain.  Neurological: Negative for dizziness, tingling, sensory change, focal weakness, loss of consciousness and headaches.  Endo/Heme/Allergies: Does not bruise/bleed easily.  Psychiatric/Behavioral: Negative for depression, memory loss and substance abuse. The patient is not nervous/anxious.    Blood pressure (!) 178/64, pulse (!) 51, temperature (!) 97.5 F (36.4 C), temperature source Oral, resp. rate 13, height 5\' 2"  (1.575 m), weight 72.6 kg, SpO2 100 %.  Physical Exam  Constitutional: She appears well-developed and well-nourished. No distress.  HENT:  Head: Normocephalic and atraumatic.  Eyes: Conjunctivae are normal. Right eye exhibits no discharge. Left eye exhibits no discharge. No scleral icterus.  Neck: Normal range of motion.  Cardiovascular: Normal rate and regular rhythm.  Respiratory: Effort normal. No respiratory distress.  Musculoskeletal:     Comments: LLE No traumatic wounds, ecchymosis, or rash  TTP hip  No knee or ankle effusion  Knee stable to varus/ valgus and anterior/posterior stress  Sens DPN, SPN, TN intact  Motor EHL, ext, flex, evers 5/5  DP 2+, PT 1+, No significant edema  Neurological: She is alert.  Skin: Skin is warm and dry. She is not diaphoretic.  Psychiatric: She has a normal mood and affect. Her behavior is normal.     Assessment/Plan: Left hip fx -- Plan IMN with Dr. Doreatha Martin later today. Please keep NPO. Multiple medical problems including mild cognitive impairment, coronary artery disease, hypertension, type 2 diabetes mellitus, history of CVA, chronic diastolic CHF, and paroxysmal atrial fibrillation on Eliquis -- Please hold heparin and Eliquis for now, may restart Eliquis after surgery. Rest per primary service    Lisette Abu, PA-C Orthopedic Surgery [phone] 06/09/2019, 8:47 AM

## 2019-06-09 NOTE — H&P (Signed)
History and Physical    Laura Parks XHB:[id number] DOB: 1930/07/23 DOA: 06/09/2019  PCP: Glenda Chroman, MD   Patient coming from: Home   Chief Complaint: Fall with left hip pain   HPI: Laura Parks is a 85 y.o. female with medical history significant for mild cognitive impairment, coronary artery disease, hypertension, type 2 diabetes mellitus, history of CVA, chronic diastolic CHF, and paroxysmal atrial fibrillation on Eliquis, presented to the emergency department with left hip pain after a fall.  The patient reports that she was in her usual state of health and was having an uneventful day when she suffered a mechanical fall at home.  She experienced immediate and severe pain in her left hip, denies hitting her head or losing consciousness.  She reports that she took her evening medications prior to coming in, including apixaban.  She denies any recent fevers, chills, cough, or shortness of breath.  She reports that she ambulates without assistance at baseline and is able to ascend a flight of stairs without chest pain or any significant dyspnea.  ED Course: Upon arrival to the ED, patient is found to be afebrile, saturating well on room air, bradycardic in the mid 50s, and with blood pressure 180/64.  EKG features a sinus bradycardia with rate 52, RBBB, LAFB, and similar to prior.  Chest x-ray features mild cardiomegaly but no acute cardiopulmonary disease.  CT of the left hip demonstrates comminuted, displaced, and angulated proximal femur fracture.  Chemistry panel notable for creatinine 1.39 and CBC with mild normocytic anemia.  Patient was treated with morphine and Zofran in the emergency department.  Orthopedic surgery was consulted by the ED physician.  Review of Systems:  All other systems reviewed and apart from HPI, are negative.  Past Medical History:  Diagnosis Date  . Anemia   . Cognitive impairment   . Colon cancer ([place])   . Coronary artery disease    Minimal coronary  atherosclerosis at cardiac catheterization April 2017  . Essential hypertension, benign   . History of pneumonia   . Iron deficiency anemia    Erosive antral gastritis  . Legal blindness   . Macular degeneration   . Mixed hyperlipidemia   . PAF (paroxysmal atrial fibrillation) ([place])   . Stroke [facility]) 01/2016   [place] infract  . Type 2 diabetes mellitus ([place])     Past Surgical History:  Procedure Laterality Date  . APPENDECTOMY    . CARDIAC CATHETERIZATION N/A 01/04/2016   Procedure: Left Heart Cath and Coronary Angiography;  Surgeon: Leonie Man, MD;  Location: [place] CV [facility];  Service: Cardiovascular;  Laterality: N/A;  . CATARACT EXTRACTION    . CHOLECYSTECTOMY    . Colon surgery for colon cancer     1992 by Dr Mendel Ryder  . COLONOSCOPY  03/24/2012   Procedure: COLONOSCOPY;  Surgeon: Rogene Houston, MD;  [facility];  Service: Endoscopy;  Laterality: N/A;  1200  . COLONOSCOPY N/A 07/03/2016   Procedure: COLONOSCOPY;  Surgeon: Rogene Houston, MD;  [facility];  Service: Endoscopy;  Laterality: N/A;  200  . ESOPHAGOGASTRODUODENOSCOPY  08/04/2012   Procedure: ESOPHAGOGASTRODUODENOSCOPY (EGD);  Surgeon: Rogene Houston, MD;  [facility];  Service: Endoscopy;  Laterality: N/A;  325  . Right knee surgery    . TEE WITHOUT CARDIOVERSION N/A 02/01/2016   Procedure: TRANSESOPHAGEAL ECHOCARDIOGRAM (TEE);  Surgeon: Jerline Pain, MD;  Location: [place];  Service: Cardiovascular;  Laterality: N/A;  .  TUBAL LIGATION       reports that she has never smoked. She has never used smokeless tobacco. She reports that she does not drink alcohol or use drugs.  Allergies  Allergen Reactions  . Codeine Nausea Only    Family History  Problem Relation Age of Onset  . COPD Mother   . Alzheimer's disease Mother   . Congestive Heart Failure Mother   . Diabetes Brother      Prior to Admission medications   Medication Sig Start Date End Date Taking?  Authorizing Provider  acetaminophen (TYLENOL) 500 MG tablet Take 500 mg by mouth as needed.    [provider]  alendronate (FOSAMAX) 70 MG tablet Take 70 mg by mouth every 7 (seven) days. Take with a full glass of water on an empty stomach. - on Fridays    [provider]  amLODipine (NORVASC) 5 MG tablet Take 1 tablet (5 mg total) by mouth daily. 12/08/16 03/08/17  Melvenia Beam, MD  apixaban (ELIQUIS) 2.5 MG TABS tablet Take 1 tablet (2.5 mg total) by mouth 2 (two) times daily. 12/08/16   Melvenia Beam, MD  atorvastatin (LIPITOR) 10 MG tablet Take 1 tablet (10 mg total) by mouth every evening. 12/08/16   Melvenia Beam, MD  cholestyramine Lucrezia Starch) 4 GM/DOSE powder Take 4 g by mouth as needed.     [provider]  citalopram (CELEXA) 20 MG tablet Take 1 tablet (20 mg total) by mouth daily. Please call [phone] to schedule appt for continued refills. 02/18/18   Melvenia Beam, MD  Cyanocobalamin (VITAMIN B-12) 2500 MCG SUBL Place 2,500 mcg under the tongue daily.     [provider]  donepezil (ARICEPT) 10 MG tablet Take 1 tablet (10 mg total) by mouth at bedtime. 12/08/16   Melvenia Beam, MD  ergocalciferol (VITAMIN D2) 50000 units capsule Take 50,000 Units by mouth once a week.    [provider]  ferrous sulfate 325 (65 FE) MG tablet Take 325 mg by mouth daily with breakfast.    [provider]  meclizine (ANTIVERT) 25 MG tablet Take 25 mg by mouth 3 (three) times daily as needed. dizziness 01/24/16   [provider]  mirtazapine (REMERON) 15 MG tablet Take 15 mg by mouth at bedtime. 01/11/16   [provider]  ondansetron (ZOFRAN-ODT) 4 MG disintegrating tablet Take 4 mg by mouth every 6 (six) hours as needed. Nausea and vomiting 01/24/16   [provider]  pioglitazone (ACTOS) 15 MG tablet Take 15 mg by mouth daily. 01/10/16   [provider]  Sennosides (SENOKOT PO) Take 1 tablet by mouth as needed.      [provider]    Physical Exam: Vitals:   06/09/19 0230  BP: (!) 198/68  Pulse: (!) 56  Resp: 20  Temp: 98.2 F (36.8 C)  TempSrc: Oral  SpO2: 97%    Constitutional: NAD, calm  Eyes: PERTLA, lids and conjunctivae normal ENMT: Mucous membranes are moist. Posterior pharynx clear of any exudate or lesions.   Neck: normal, supple, no masses, no thyromegaly Respiratory: no wheezing, no crackles. Normal respiratory effort. No accessory muscle use.  Cardiovascular: S1 & S2 heard, regular rate and rhythm. No extremity edema.   Abdomen: No distension, no tenderness, soft. Bowel sounds normal.  Musculoskeletal: no clubbing / cyanosis. Left hip tenderness and gross deformity, neurovascularly intact distally.  Skin: no significant rashes, lesions, ulcers. Warm, dry, well-perfused. Neurologic: No facial asymmetry. Gross hearing  deficit. Sensation intact. Moving all extremities.  Psychiatric: Alert and oriented to person, place, and situation. Pleasant, cooperative.    Labs on Admission: I have personally reviewed following labs and imaging studies  CBC: No results for input(s): WBC, NEUTROABS, HGB, HCT, MCV, PLT in the last 168 hours. Basic Metabolic Panel: No results for input(s): NA, K, CL, CO2, GLUCOSE, BUN, CREATININE, CALCIUM, MG, PHOS in the last 168 hours. GFR: CrCl cannot be calculated (Patient's most recent lab result is older than the maximum 21 days allowed.). Liver Function Tests: No results for input(s): AST, ALT, ALKPHOS, BILITOT, PROT, ALBUMIN in the last 168 hours. No results for input(s): LIPASE, AMYLASE in the last 168 hours. No results for input(s): AMMONIA in the last 168 hours. Coagulation Profile: No results for input(s): INR, PROTIME in the last 168 hours. Cardiac Enzymes: No results for input(s): CKTOTAL, CKMB, CKMBINDEX, TROPONINI in the last 168 hours. BNP (last 3 results) No results for input(s): PROBNP in the last 8760 hours. HbA1C: No  results for input(s): HGBA1C in the last 72 hours. CBG: No results for input(s): GLUCAP in the last 168 hours. Lipid Profile: No results for input(s): CHOL, HDL, LDLCALC, TRIG, CHOLHDL, LDLDIRECT in the last 72 hours. Thyroid Function Tests: No results for input(s): TSH, T4TOTAL, FREET4, T3FREE, THYROIDAB in the last 72 hours. Anemia Panel: No results for input(s): VITAMINB12, FOLATE, FERRITIN, TIBC, IRON, RETICCTPCT in the last 72 hours. Urine analysis:    Component Value Date/Time   COLORURINE YELLOW 01/30/2016 1830   APPEARANCEUR CLEAR 01/30/2016 1830   LABSPEC 1.015 01/30/2016 1830   PHURINE 5.5 01/30/2016 1830   GLUCOSEU NEGATIVE 01/30/2016 1830   HGBUR NEGATIVE 01/30/2016 1830   BILIRUBINUR NEGATIVE 01/30/2016 1830   [place] 01/30/2016 1830   PROTEINUR TRACE (A) 01/30/2016 1830   NITRITE NEGATIVE 01/30/2016 1830   LEUKOCYTESUR TRACE (A) 01/30/2016 1830   Sepsis Labs: @LABRCNTIP (procalcitonin:4,lacticidven:4) )No results found for this or any previous visit (from the past 240 hour(s)).   Radiological Exams on Admission: No results found.  EKG: Independently reviewed. Sinus bradycardia, rate 57, RBBB, LAFB.   Assessment/Plan   1. Left hip fracture  - Presents with left hip pain after a mechanical fall and is found to have proximal left femur fracture  - Orthopedic surgery is consulting and much appreciated  - Based on the available data, Mrs. Ryle presents an estimated 1% risk of perioperative cardiac arrest or MI  - Her last dose of Eliquis was evening of 9/23  - Recommend delaying surgery until 9/26 due to bleeding risk, or she can be treated with Andexxa if surgery needs to be performed more urgently  - Continue pain-control, hold Eliquis, start heparin infusion given high CHADS-VASc score    2. Paroxysmal atrial fibrillation  - In sinus rhythm on admission  - CHADS-VASc is 19 (gender, age x2, CVA x2, DM, CHF, HTN) - Hold Eliquis (last dose evening of  9/23), start IV heparin per pharmacy d/t high CVA risk   3. Renal insufficiency  - SCr is 1.36 on admission, up from 0.90 in June 2020  - She appears hypovolemic on admission  - Hold Lasix, continue gentle IVF hydration, renally-dose medications, and monitor    4. Type II DM  - A1c was 7.0% in June 2020  - Managed with Actos at home, held on admission  - Use low-intensity SSI with Novolog while in hospital    5. Chronic diastolic CHF  - EF preserved in June 2020 with grade  II diastolic dysfunction, no WMA's, mild MR, and mild LAE  - Appears compensated  - Hold Lasix, continue gentle IVF hydration, follow daily wt and I/O's    6. CAD  - No anginal complaints  - Continue statin    PPE: Mask, face shield  DVT prophylaxis: Eliquis pta, last dose evening of 9/23  Code Status: Full code perioperatively, confirmed with patient  Family Communication: Discussed with patient  Consults called: Orthopedic surgery   Admission status: Inpatient     Vianne Bulls, MD [facility] Pager [phone]  If 7PM-7AM, please contact night-coverage [email or web address] Password [facility]  06/09/2019, 3:26 AM

## 2019-06-09 NOTE — Progress Notes (Signed)
Patient ID: Laura Parks, female   DOB: 1930-07-09, 85 y.o.   MRN: [id number] Patient admitted early this morning for left hip fracture after a mechanical fall.  Orthopedic surgery has been consulted.  Probable surgical intervention today.  Hold heparin for now.  Patient seen and examined at bedside and plan of care discussed with her.  I have reviewed patient's medical records including this morning's H&P myself.

## 2019-06-09 NOTE — ED Provider Notes (Signed)
[place] EMERGENCY DEPARTMENT Provider Note   CSN: [id number] Arrival date & time: 06/09/19  0225    History   Chief Complaint Chief Complaint  Patient presents with  . Hip Pain    HPI Laura Parks is a 85 y.o. female.   The history is provided by the patient.  She has history of diabetes, hyperlipidemia, hypertension, stroke, paroxysmal atrial fibrillation anticoagulated on apixaban and was transferred here from [facility] after a fall at home with resultant fracture of left hip.  She denies other injury.  Past Medical History:  Diagnosis Date  . Anemia   . Cognitive impairment   . Colon cancer ([place])   . Coronary artery disease    Minimal coronary atherosclerosis at cardiac catheterization April 2017  . Essential hypertension, benign   . History of pneumonia   . Iron deficiency anemia    Erosive antral gastritis  . Legal blindness   . Macular degeneration   . Mixed hyperlipidemia   . PAF (paroxysmal atrial fibrillation) ([place])   . Stroke [facility]) 01/2016   [place] infract  . Type 2 diabetes mellitus [facility])     Patient Active Problem List   Diagnosis Date Noted  . Blood in the stool 06/18/2016  . Family hx of colon cancer 06/18/2016  . Absolute anemia 06/18/2016  . Embolic stroke ([place]) 57/32/2025  . Cerebrovascular accident (CVA) ([place])   . Cerebral infarction (hemorrhagic or thromboembolic)   . Acute delirium 12/31/2015  . Vascular dementia ([place]) 12/31/2015  . PAF (paroxysmal atrial fibrillation) ([place])   . Palliative care encounter   . Acute renal failure ([place])   . Altered mental status   . Stroke ([place])   . NSTEMI (non-ST elevated myocardial infarction) ([place])   . Hyperkalemia 12/27/2015  . CHF (congestive heart failure) ([place]) 12/27/2015  . Acute encephalopathy 12/27/2015  . TIA (transient ischemic attack) 12/27/2015  . AKI (acute kidney injury) ([place]) 12/27/2015  . Dehydration 12/27/2015  . Legal blindness 12/27/2015  . B12  deficiency 09/02/2014  . Gait abnormality 07/26/2014  . Delusions ([place]) 07/26/2014  . Depression 07/26/2014  . Cognitive impairment   . Type 2 diabetes mellitus ([place]) 02/02/2014  . Carotid artery occlusion 02/02/2014  . PVC's (premature ventricular contractions) 02/02/2014  . Mixed hyperlipidemia 02/23/2012  . Essential hypertension, benign 02/23/2012    Past Surgical History:  Procedure Laterality Date  . APPENDECTOMY    . CARDIAC CATHETERIZATION N/A 01/04/2016   Procedure: Left Heart Cath and Coronary Angiography;  Surgeon: Leonie Man, MD;  Location: [place] CV LAB;  Service: Cardiovascular;  Laterality: N/A;  . CATARACT EXTRACTION    . CHOLECYSTECTOMY    . Colon surgery for colon cancer     1992 by Dr Mendel Ryder  . COLONOSCOPY  03/24/2012   Procedure: COLONOSCOPY;  Surgeon: Rogene Houston, MD;  [facility];  Service: Endoscopy;  Laterality: N/A;  1200  . COLONOSCOPY N/A 07/03/2016   Procedure: COLONOSCOPY;  Surgeon: Rogene Houston, MD;  [facility];  Service: Endoscopy;  Laterality: N/A;  200  . ESOPHAGOGASTRODUODENOSCOPY  08/04/2012   Procedure: ESOPHAGOGASTRODUODENOSCOPY (EGD);  Surgeon: Rogene Houston, MD;  [facility];  Service: Endoscopy;  Laterality: N/A;  325  . Right knee surgery    . TEE WITHOUT CARDIOVERSION N/A 02/01/2016   Procedure: TRANSESOPHAGEAL ECHOCARDIOGRAM (TEE);  Surgeon: Jerline Pain, MD;  Location: [place];  Service: Cardiovascular;  Laterality: N/A;  . TUBAL LIGATION  OB History   No obstetric history on file.      Home Medications    Prior to Admission medications   Medication Sig Start Date End Date Taking? Authorizing Provider  acetaminophen (TYLENOL) 500 MG tablet Take 500 mg by mouth as needed.    [provider]  alendronate (FOSAMAX) 70 MG tablet Take 70 mg by mouth every 7 (seven) days. Take with a full glass of water on an empty stomach. - on Fridays    [provider]  amLODipine (NORVASC) 5 MG tablet Take 1 tablet (5 mg total) by mouth daily. 12/08/16 03/08/17  Melvenia Beam, MD  apixaban (ELIQUIS) 2.5 MG TABS tablet Take 1 tablet (2.5 mg total) by mouth 2 (two) times daily. 12/08/16   Melvenia Beam, MD  atorvastatin (LIPITOR) 10 MG tablet Take 1 tablet (10 mg total) by mouth every evening. 12/08/16   Melvenia Beam, MD  cholestyramine Lucrezia Starch) 4 GM/DOSE powder Take 4 g by mouth as needed.     [provider]  citalopram (CELEXA) 20 MG tablet Take 1 tablet (20 mg total) by mouth daily. Please call [phone] to schedule appt for continued refills. 02/18/18   Melvenia Beam, MD  Cyanocobalamin (VITAMIN B-12) 2500 MCG SUBL Place 2,500 mcg under the tongue daily.     [provider]  donepezil (ARICEPT) 10 MG tablet Take 1 tablet (10 mg total) by mouth at bedtime. 12/08/16   Melvenia Beam, MD  ergocalciferol (VITAMIN D2) 50000 units capsule Take 50,000 Units by mouth once a week.    [provider]  ferrous sulfate 325 (65 FE) MG tablet Take 325 mg by mouth daily with breakfast.    [provider]  meclizine (ANTIVERT) 25 MG tablet Take 25 mg by mouth 3 (three) times daily as needed. dizziness 01/24/16   [provider]  mirtazapine (REMERON) 15 MG tablet Take 15 mg by mouth at bedtime. 01/11/16   [provider]  ondansetron (ZOFRAN-ODT) 4 MG disintegrating tablet Take 4 mg by mouth every 6 (six) hours as needed. Nausea and vomiting 01/24/16   [provider]  pioglitazone (ACTOS) 15 MG tablet Take 15 mg by mouth daily. 01/10/16   [provider]  Sennosides (SENOKOT PO) Take 1 tablet by mouth as needed.     [provider]    Family History Family History  Problem Relation Age of Onset  . COPD Mother   . Alzheimer's disease Mother   . Congestive Heart Failure Mother   . Diabetes Brother     Social History Social History   Tobacco Use  . Smoking status: Never Smoker   . Smokeless tobacco: Never Used  Substance Use Topics  . Alcohol use: No    Alcohol/week: 0.0 standard drinks  . Drug use: No     Allergies   Codeine   Review of Systems Review of Systems  All other systems reviewed and are negative.    Physical Exam Updated Vital Signs BP (!) 198/68   Pulse (!) 56   Temp 98.2 F (36.8 C) (Oral)   Resp 20   SpO2 97%   Physical Exam Vitals signs and nursing note reviewed.    85 year old female, resting comfortably and in no acute distress. Vital signs are consistent with elevated blood pressure. Oxygen saturation is 97%, which is normal. Head is normocephalic and atraumatic. PERRLA, EOMI. Oropharynx is clear. Neck is nontender and supple without adenopathy or JVD. Back  is nontender and there is no CVA tenderness. Lungs are clear without rales, wheezes, or rhonchi. Chest is nontender. Heart has regular rate and rhythm without murmur. Abdomen is soft, flat, nontender without masses or hepatosplenomegaly and peristalsis is normoactive. Genitalia: Foley catheter in place. Extremities: Left leg is shortened and externally rotated with a significant tenderness to palpation in the region of the left hip and marked pain with any passive range of motion of the left hip.  Distal neurovascular exam is intact. Skin is warm and dry without rash. Neurologic: Mental status is normal, cranial nerves are intact, there are no motor or sensory deficits.  ED Treatments / Results  Labs (all labs ordered are listed, but only abnormal results are displayed) Labs Reviewed  SARS CORONAVIRUS 2 (HOSPITAL ORDER, [place] [facility])  BASIC METABOLIC PANEL  CBC WITH DIFFERENTIAL/PLATELET  PROTIME-INR  URINALYSIS, ROUTINE W REFLEX MICROSCOPIC  TYPE AND SCREEN    EKG EKG Interpretation  Date/Time:  Thursday June 09 2019 03:33:52 EDT Ventricular Rate:  57 PR Interval:    QRS Duration: 134 QT Interval:  510 QTC Calculation: 497  R Axis:   -83 Text Interpretation:  Sinus rhythm RBBB and LAFB Probable left ventricular hypertrophy When compared with ECG of 01/30/2016, T wave abnormality is no longer present Premature ventricular complexes are no longer present Confirmed by Delora Fuel ([id number]) on 06/09/2019 3:39:17 AM   Radiology Dg Chest Port 1 View  Result Date: 06/09/2019 CLINICAL DATA:  85 year old female with preop exam. EXAM: PORTABLE CHEST 1 VIEW COMPARISON:  Chest radiograph dated 10/30/2017 FINDINGS: Mild diffuse interstitial coarsening and chronic bronchitic changes. No focal consolidation, pleural effusion, pneumothorax. Mild cardiomegaly. Calcification of the mitral annulus. Atherosclerotic calcification of the aorta. Osteopenia with degenerative changes of the spine. No acute osseous pathology. IMPRESSION: 1. No acute cardiopulmonary process. 2. Mild cardiomegaly. Electronically Signed   By: Anner Crete M.D.   On: 06/09/2019 03:32    Report of CT of left hip done at [facility] shows intratrochanteric left hip fracture with some subtrochanteric component.  Images viewed independently by me.  Procedures Procedures   Medications Ordered in ED Medications  morphine 4 MG/ML injection 4 mg (has no administration in time range)  ondansetron (ZOFRAN) injection 4 mg (has no administration in time range)     Initial Impression / Assessment and Plan / ED Course  I have reviewed the triage vital signs and the nursing notes.  Pertinent labs & imaging results that were available during my care of the patient were reviewed by me and considered in my medical decision making (see chart for details).  Left hip fracture and patient who is anticoagulated on apixaban.  Per note from transferring ED, Dr. Doreatha Martin was planning to do surgery in the morning.  Will check screening labs, preop ECG and chest x-ray, plan to admit to hospitalist service.  Laboratory work-up does show renal insufficiency and mild anemia, both of  which are unchanged from baseline.  Chest x-ray shows mild cardiomegaly.  ECG is unremarkable.  Case is discussed with Dr. Myna Hidalgo of [facility], who agrees to admit the patient.  Final Clinical Impressions(s) / ED Diagnoses   Final diagnoses:  Closed intertrochanteric fracture of left hip, initial encounter ([place])  Chronic anticoagulation  Renal insufficiency  Normochromic normocytic anemia  Elevated blood pressure reading with diagnosis of hypertension    ED Discharge Orders    None       Delora Fuel, MD 39/76/73 [phone]

## 2019-06-09 NOTE — Anesthesia Postprocedure Evaluation (Signed)
Anesthesia Post Note  Patient: Laura Parks  Procedure(s) Performed: INTRAMEDULLARY (IM) NAIL INTERTROCHANTRIC (Left Hip)     Patient location during evaluation: PACU Anesthesia Type: General Level of consciousness: awake and alert Pain management: pain level controlled Vital Signs Assessment: post-procedure vital signs reviewed and stable Respiratory status: spontaneous breathing, nonlabored ventilation, respiratory function stable and patient connected to nasal cannula oxygen Cardiovascular status: blood pressure returned to baseline and stable Postop Assessment: no apparent nausea or vomiting Anesthetic complications: no    Last Vitals:  Vitals:   06/09/19 1800 06/09/19 1841  BP: (!) 131/51 (!) 125/50  Pulse: 72 67  Resp: (!) 22 16  Temp:  36.6 C  SpO2: 94% (!) 74%    Last Pain:  Vitals:   06/09/19 1841  TempSrc: Oral  PainSc:                  Audry Pili

## 2019-06-09 NOTE — ED Notes (Signed)
Sent urine culter with urine specimen.

## 2019-06-09 NOTE — Anesthesia Preprocedure Evaluation (Addendum)
Anesthesia Evaluation  Patient identified by MRN, date of birth, ID band Patient awake    Reviewed: Allergy & Precautions, NPO status , Patient's Chart, lab work & pertinent test results  History of Anesthesia Complications Negative for: history of anesthetic complications  Airway Mallampati: II  TM Distance: >3 FB Neck ROM: Full    Dental  (+) Dental Advisory Given   Pulmonary  06/09/2019 SARS coronavirus NEG   breath sounds clear to auscultation       Cardiovascular hypertension, Pt. on medications + dysrhythmias Atrial Fibrillation  Rhythm:Regular Rate:Normal  '17 Cath: minimal coronary disease '17 ECHO: EF 60-65%, mild MR   Neuro/Psych Depression CVA, No Residual Symptoms    GI/Hepatic negative GI ROS, Neg liver ROS,   Endo/Other  diabetes (glu 135), Insulin Dependent  Renal/GU Renal InsufficiencyRenal disease (creat 1.32)     Musculoskeletal   Abdominal   Peds  Hematology negative hematology ROS (+)   Anesthesia Other Findings   Reproductive/Obstetrics                            Anesthesia Physical Anesthesia Plan  ASA: III  Anesthesia Plan: General   Post-op Pain Management:    Induction: Intravenous  PONV Risk Score and Plan: 3 and Ondansetron, Dexamethasone and Treatment may vary due to age or medical condition  Airway Management Planned: Oral ETT  Additional Equipment:   Intra-op Plan:   Post-operative Plan: Extubation in OR  Informed Consent: I have reviewed the patients History and Physical, chart, labs and discussed the procedure including the risks, benefits and alternatives for the proposed anesthesia with the patient or authorized representative who has indicated his/her understanding and acceptance.     Dental advisory given  Plan Discussed with: CRNA and Surgeon  Anesthesia Plan Comments:        Anesthesia Quick Evaluation

## 2019-06-10 ENCOUNTER — Encounter (HOSPITAL_COMMUNITY): Payer: Self-pay | Admitting: Student

## 2019-06-10 DIAGNOSIS — N183 Chronic kidney disease, stage 3 (moderate): Secondary | ICD-10-CM

## 2019-06-10 LAB — GLUCOSE, CAPILLARY
Glucose-Capillary: 194 mg/dL — ABNORMAL HIGH (ref 70–99)
Glucose-Capillary: 204 mg/dL — ABNORMAL HIGH (ref 70–99)
Glucose-Capillary: 250 mg/dL — ABNORMAL HIGH (ref 70–99)
Glucose-Capillary: 274 mg/dL — ABNORMAL HIGH (ref 70–99)
Glucose-Capillary: 301 mg/dL — ABNORMAL HIGH (ref 70–99)

## 2019-06-10 LAB — CBC
HCT: 23.9 % — ABNORMAL LOW (ref 36.0–46.0)
Hemoglobin: 7.5 g/dL — ABNORMAL LOW (ref 12.0–15.0)
MCH: 28.4 pg (ref 26.0–34.0)
MCHC: 31.4 g/dL (ref 30.0–36.0)
MCV: 90.5 fL (ref 80.0–100.0)
Platelets: 115 10*3/uL — ABNORMAL LOW (ref 150–400)
RBC: 2.64 MIL/uL — ABNORMAL LOW (ref 3.87–5.11)
RDW: 14.3 % (ref 11.5–15.5)
WBC: 7.2 10*3/uL (ref 4.0–10.5)
nRBC: 0 % (ref 0.0–0.2)

## 2019-06-10 LAB — BASIC METABOLIC PANEL
Anion gap: 7 (ref 5–15)
BUN: 34 mg/dL — ABNORMAL HIGH (ref 8–23)
CO2: 25 mmol/L (ref 22–32)
Calcium: 8.5 mg/dL — ABNORMAL LOW (ref 8.9–10.3)
Chloride: 106 mmol/L (ref 98–111)
Creatinine, Ser: 1.63 mg/dL — ABNORMAL HIGH (ref 0.44–1.00)
GFR calc Af Amer: 32 mL/min — ABNORMAL LOW (ref >60)
GFR calc non Af Amer: 28 mL/min — ABNORMAL LOW (ref >60)
Glucose, Bld: 287 mg/dL — ABNORMAL HIGH (ref 70–99)
Potassium: 5.2 mmol/L — ABNORMAL HIGH (ref 3.5–5.1)
Sodium: 138 mmol/L (ref 135–145)

## 2019-06-10 LAB — MAGNESIUM: Magnesium: 1.7 mg/dL (ref 1.7–2.4)

## 2019-06-10 MED ORDER — APIXABAN 2.5 MG PO TABS
2.5000 mg | ORAL_TABLET | Freq: Two times a day (BID) | ORAL | Status: DC
  Administered 2019-06-10 – 2019-06-13 (×7): 2.5 mg via ORAL
  Filled 2019-06-10 (×7): qty 1

## 2019-06-10 NOTE — Progress Notes (Signed)
Inpatient Rehab Admissions:  Inpatient Rehab Consult received.  I met with pt at the bedside for rehabilitation assessment. Pt pleased with how she did in therapy today and understands she is going to need some rehab before she goes home. With permission I spoke to her husband Marchia Bond over the phone to gather more information about caregiver assistance and home set up. The pt's husband is 66 years older than she is and states he can assist some and may be able to have some help from his neighbor across the street if needed. Their son works 3rd shift and has to sleep during the day so he would not be able to provide much physical assistance either.   Will need to see how pt progresses over the weekend to see if pt is expected to make quick gains in a short period of time that would support an IP Rehab stay and allow her to return home with limited physical assist.   Will follow up Monday to determine if CIR is appropriate.   Jhonnie Garner, OTR/L  Rehab Admissions Coordinator  [phone] 06/10/2019 4:35 PM

## 2019-06-10 NOTE — Evaluation (Signed)
Occupational Therapy Evaluation Patient Details Name: JARVIS KNODEL MRN: [id number] DOB: 1929/10/08 Today's Date: 06/10/2019    History of Present Illness Pt is an 85 y.o. female admitted 06/09/19 after fall sustaining L intertrochanteric femur fx, s/p IM nailing. PMH includes mild cognitive impairment, CAD, HTN, DM2, CVA, CHF, afib on eliquis, legal blindness.   Clinical Impression   Pt reports she was walking and performing self care independently prior to admission. Her husband cooked and did the housekeeping. Pt presents with low vision, generalized weakness, poor sitting and standing balance, anxiety and baseline cognitive deficits. Pt requires +2 assist for all mobility and min to total assist for ADL. She will need post acute rehab prior to return home. Will follow acutely.    Follow Up Recommendations  CIR    Equipment Recommendations  3 in 1 bedside commode    Recommendations for Other Services       Precautions / Restrictions Precautions Precautions: Fall Restrictions Weight Bearing Restrictions: Yes LLE Weight Bearing: Weight bearing as tolerated      Mobility Bed Mobility Overal bed mobility: Needs Assistance Bed Mobility: Supine to Sit     Supine to sit: +2 for safety/equipment;Total assist     General bed mobility comments: used helicopter technique to pivot pt with bed pad to EOB  Transfers Overall transfer level: Needs assistance Equipment used: Rolling walker (2 wheeled) Transfers: Sit to/from [employer] Transfers Sit to Stand: +2 physical assistance;Max assist Stand pivot transfers: +2 physical assistance;Max assist       General transfer comment: assist for hand placement, stood with gait belt and bed pad under hips, slowly pivoted to chair with verbal cues and assist to advance walker    Balance Overall balance assessment: Needs assistance Sitting-balance support: Bilateral upper extremity supported Sitting balance-Leahy Scale:  Poor Sitting balance - Comments: max assist initially, progressed to min guard    Standing balance support: Bilateral upper extremity supported Standing balance-Leahy Scale: Poor Standing balance comment: B UE support and external assist                           ADL either performed or assessed with clinical judgement   ADL Overall ADL's : Needs assistance/impaired Eating/Feeding: Minimal assistance;Sitting Eating/Feeding Details (indicate cue type and reason): assist due to vision Grooming: Set up;Sitting   Upper Body Bathing: Moderate assistance;Sitting   Lower Body Bathing: Total assistance;+2 for physical assistance;Sit to/from stand   Upper Body Dressing : Moderate assistance;Sitting   Lower Body Dressing: +2 for physical assistance;Total assistance;Sit to/from stand   Toilet Transfer: +2 for physical assistance;Maximal assistance;Stand-pivot;BSC;RW   Toileting- Water quality scientist and Hygiene: Total assistance;+2 for physical assistance;Sit to/from stand               Vision Baseline Vision/History: Retinopathy Patient Visual Report: No change from baseline Additional Comments: can read large print only     Perception     Praxis      Pertinent Vitals/Pain Pain Assessment: Faces Faces Pain Scale: Hurts even more Pain Location: L hip Pain Descriptors / Indicators: Grimacing;Guarding;Moaning Pain Intervention(s): Monitored during session;Repositioned     Hand Dominance Right   Extremity/Trunk Assessment Upper Extremity Assessment Upper Extremity Assessment: Generalized weakness;Overall [facility] for tasks assessed   Lower Extremity Assessment Lower Extremity Assessment: Defer to PT evaluation   Cervical / Trunk Assessment Cervical / Trunk Assessment: Kyphotic   Communication Communication Communication: HOH   Cognition Arousal/Alertness: Awake/alert Behavior During Therapy: Anxious Overall  Cognitive Status: History of cognitive impairments  - at baseline                                 General Comments: pt with impaired memory and difficulty sequencing   General Comments       Exercises     Shoulder Instructions      Home Living Family/patient expects to be discharged to:: Private residence Living Arrangements: Spouse/significant other;Children Available Help at Discharge: Family;Available 24 hours/day Type of Home: House Home Access: Stairs to enter [employer] of Steps: 3 Entrance Stairs-Rails: Right Home Layout: One level     Bathroom Shower/Tub: Teacher, early years/pre: Standard     Home Equipment: None          Prior Functioning/Environment Level of Independence: Needs assistance  Gait / Transfers Assistance Needed: walks without a device ADL's / Homemaking Assistance Needed: husband does cooking, cleaning and driving            OT Problem List: Decreased strength;Decreased activity tolerance;Impaired balance (sitting and/or standing);Impaired vision/perception;Decreased cognition;Decreased safety awareness;Decreased knowledge of use of DME or AE;Pain;Obesity      OT Treatment/Interventions: Self-care/ADL training;DME and/or AE instruction;Therapeutic activities;Patient/family education;Balance training;Cognitive remediation/compensation    OT Goals(Current goals can be found in the care plan section) Acute Rehab OT Goals Patient Stated Goal: to return home OT Goal Formulation: With patient Time For Goal Achievement: 06/24/19 Potential to Achieve Goals: Good ADL Goals Pt Will Perform Grooming: with set-up;with supervision Pt Will Perform Upper Body Dressing: with set-up;with supervision;sitting Pt Will Transfer to Toilet: with min assist;ambulating;bedside commode Additional ADL Goal #1: Pt will perform bed mobility with moderate assistance in preparation for ADL. Additional ADL Goal #2: Pt will sit at EOB x 10 minutes with supervision while participating  in ADL.  OT Frequency: Min 2X/week   Barriers to D/C:            Co-evaluation PT/OT/SLP Co-Evaluation/Treatment: Yes Reason for Co-Treatment: For patient/therapist safety   OT goals addressed during session: ADL's and self-care;Proper use of Adaptive equipment and DME      AM-PAC OT "6 Clicks" Daily Activity     Outcome Measure Help from another person eating meals?: A Little Help from another person taking care of personal grooming?: A Little Help from another person toileting, which includes using toliet, bedpan, or urinal?: Total Help from another person bathing (including washing, rinsing, drying)?: A Lot Help from another person to put on and taking off regular upper body clothing?: A Lot Help from another person to put on and taking off regular lower body clothing?: Total 6 Click Score: 12   End of Session Equipment Utilized During Treatment: Gait belt;Rolling walker Nurse Communication: Mobility status  Activity Tolerance: Patient tolerated treatment well Patient left: in chair;with call bell/phone within reach;with chair alarm set  OT Visit Diagnosis: Unsteadiness on feet (R26.81);Muscle weakness (generalized) (M62.81);History of falling (Z91.81);Pain;Other symptoms and signs involving cognitive function Pain - Right/Left: Left Pain - part of body: Hip                Time: 6073-7106 OT Time Calculation (min): 36 min Charges:  OT General Charges $OT Visit: 1 Visit OT Evaluation $OT Eval Moderate Complexity: 1 Mod  Nestor Lewandowsky, OTR/L Acute Rehabilitation Services Pager: [phone] Office: [phone]  Malka So 06/10/2019, 11:26 AM

## 2019-06-10 NOTE — Progress Notes (Signed)
Orthopaedic Trauma Progress Note  S: Doing well, minimal pain at rest. Has not been up with therapy yet. No questions or concerns currently  O:  Vitals:   06/10/19 0844 06/10/19 0846  BP: (!) 85/35 (!) 90/44  Pulse: 62 (!) 59  Resp: 16   Temp: 98.4 F (36.9 C)   SpO2: 95%     General - Sitting up in bed, NAD. Pleasant and cooperative  Left Lower Extremity - Dressing clean, dry, intact. Some tenderness with palpation of lateral thigh. Non-tender in knee or lower leg. Dorsiflexion/plantarflexion intact. Able to wiggle toes. Tolerates small amount of passive knee flexion. Sensation intact to light touch distally. 2+ DP pulse  Imaging: Stable post op imaging.   Labs:  Results for orders placed or performed during the hospital encounter of 06/09/19 (from the past 24 hour(s))  CBG monitoring, ED     Status: Abnormal   Collection Time: 06/09/19 12:51 PM  Result Value Ref Range   Glucose-Capillary 135 (H) 70 - 99 mg/dL   Comment 1 Notify RN    Comment 2 Document in Chart   Glucose, capillary     Status: Abnormal   Collection Time: 06/09/19  5:03 PM  Result Value Ref Range   Glucose-Capillary 136 (H) 70 - 99 mg/dL  Glucose, capillary     Status: Abnormal   Collection Time: 06/10/19 12:10 AM  Result Value Ref Range   Glucose-Capillary 301 (H) 70 - 99 mg/dL  CBC     Status: Abnormal   Collection Time: 06/10/19  4:20 AM  Result Value Ref Range   WBC 7.2 4.0 - 10.5 K/uL   RBC 2.64 (L) 3.87 - 5.11 MIL/uL   Hemoglobin 7.5 (L) 12.0 - 15.0 g/dL   HCT 23.9 (L) 36.0 - 46.0 %   MCV 90.5 80.0 - 100.0 fL   MCH 28.4 26.0 - 34.0 pg   MCHC 31.4 30.0 - 36.0 g/dL   RDW 14.3 11.5 - 15.5 %   Platelets 115 (L) 150 - 400 K/uL   nRBC 0.0 0.0 - 0.2 %  Basic metabolic panel     Status: Abnormal   Collection Time: 06/10/19  4:20 AM  Result Value Ref Range   Sodium 138 135 - 145 mmol/L   Potassium 5.2 (H) 3.5 - 5.1 mmol/L   Chloride 106 98 - 111 mmol/L   CO2 25 22 - 32 mmol/L   Glucose, Bld 287  (H) 70 - 99 mg/dL   BUN 34 (H) 8 - 23 mg/dL   Creatinine, Ser 1.63 (H) 0.44 - 1.00 mg/dL   Calcium 8.5 (L) 8.9 - 10.3 mg/dL   GFR calc non Af Amer 28 (L) >60 mL/min   GFR calc Af Amer 32 (L) >60 mL/min   Anion gap 7 5 - 15  Magnesium     Status: None   Collection Time: 06/10/19  4:20 AM  Result Value Ref Range   Magnesium 1.7 1.7 - 2.4 mg/dL  Glucose, capillary     Status: Abnormal   Collection Time: 06/10/19  5:24 AM  Result Value Ref Range   Glucose-Capillary 274 (H) 70 - 99 mg/dL    Assessment: 85 year old female s/p fall  Injuries: Left intertrochanteric femur fracture s/p IMN 06/09/19  Weightbearing: WBAT LLE  Insicional and dressing care: Will remove dressings tomorrow and leave open to air  Orthopedic device(s):none  CV/Blood loss: Acute blood loss anemia, Hgb 7.5 this morning. Continue to monitor CBC   Pain management:  1. Tylenol 650 mg q 6 hours scheduled 2. Robaxin 500 mg q 6 hours PRN 3. Tramadol 50 mg q 6 hours PRN 4. Neurontin 100 mg TID 5. Morphine 1-2 mg q 3 hours PRN  VTE prophylaxis: Okay to restart Eliquis today from ortho standpoint  ID:  Ancef 2gm post op  Foley/Lines:  No foley, KVO IVFs  Medical co-morbidities: coronary artery disease, hypertension, type 2 diabetes mellitus, history of CVA, chronic diastolic CHF, and paroxysmal atrial fibrillation on Eliquis  Dispo: PT/OT eval today, dispo pending. Okay for discharge from ortho standpoint once cleared by medicine team and mobilizing well with therapies  Follow - up plan: 2 weeks for wound check, repeat x-rays    Charles Andringa A. Carm[facility] ?[phone]? (phone)

## 2019-06-10 NOTE — Progress Notes (Signed)
Nutrition Brief Note  RD consulted as part of the Hip Fracture Protocol.  Spoke with pt at bedside. Pt reports that she has a good appetite and ate well at breakfast but could not figure out how to open everything. Pt states she would have eaten more if she could have opened all of the foods. Encouraged pt to press call bell when help is needed.  Pt reports that she typically eats 3 meals daily at home and occasionally snacks between meals. A meal includes lots of vegetables, a starch, and meat (not chicken due to pt's preference).  Pt denies any nutrition-related issues at this time and declines oral nutrition supplements.  NFPE completed. No evidence of fat or muscle depletions.  Wt Readings from Last 15 Encounters:  06/09/19 72.6 kg  12/08/16 71.2 kg  09/09/16 68 kg  07/03/16 63.5 kg  06/10/16 65.5 kg  06/09/16 65.1 kg  06/03/16 65.9 kg  02/27/16 64 kg  02/06/16 64.9 kg  01/30/16 66.5 kg  01/06/16 66.9 kg  08/31/14 57.2 kg  08/09/14 62.6 kg  08/07/14 63 kg  07/25/14 70.8 kg    Body mass index is 29.26 kg/m. Patient meets criteria for overweight based on current BMI.   Current diet order is Carb Modified, patient is consuming approximately 75% of meals at this time. Labs and medications reviewed.   No nutrition interventions warranted at this time. If nutrition issues arise, please consult RD.    Gaynell Face, MS, RD, LDN Inpatient Clinical Dietitian Pager: [phone] Weekend/After Hours: [phone]

## 2019-06-10 NOTE — Progress Notes (Signed)
Rehab Admissions Coordinator Note:  Patient was screened by Michel Santee for appropriateness for an Inpatient Acute Rehab Consult.  At this time, we are recommending Inpatient Rehab consult.  Michel Santee 06/10/2019, 12:27 PM  I can be reached at [phone].

## 2019-06-10 NOTE — Progress Notes (Signed)
Pharmacy - > Eliquis  Resuming Eliquis for AFib / CVA post hip surgery Prior to admission she was taking Eliquis 5 mg po BID  Scr up to 1.63 this AM  Plan: With increase in Scr, will begin Eliquis 2.5 mg po BID Follow up AM BMET -> increase dose if Scr improved  Thank you Anette Guarneri, PharmD [phone]

## 2019-06-10 NOTE — Evaluation (Signed)
Physical Therapy Evaluation Patient Details Name: Laura Parks MRN: [id number] DOB: 1930-05-03 Today's Date: 06/10/2019   History of Present Illness  Pt is an 85 y.o. female admitted 06/09/19 after fall sustaining L intertrochanteric femur fx, s/p IM nailing. PMH includes mild cognitive impairment, CAD, HTN, DM2, CVA, CHF, afib on eliquis, legal blindness.    Clinical Impression  Pt presents with an overall decrease in functional mobility secondary to above. PTA, pt independent with ambulation, lives with husband who assists with household tasks. Educ on precautions, and importance of mobility. Today, pt able to initiate transfer and gait training with RW and +2 assist. Pt limited by anxiety related to pain. Pt would benefit from continued acute PT services to maximize functional mobility and independence prior to d/c with post-acute rehab services.     Follow Up Recommendations CIR;Supervision/Assistance - 24 hour    Equipment Recommendations  (TBD)    Recommendations for Other Services       Precautions / Restrictions Precautions Precautions: Fall Restrictions Weight Bearing Restrictions: Yes LLE Weight Bearing: Weight bearing as tolerated      Mobility  Bed Mobility Overal bed mobility: Needs Assistance Bed Mobility: Supine to Sit     Supine to sit: Total assist;+2 for physical assistance     General bed mobility comments: used helicopter technique to pivot pt with bed pad to EOB, limited by LLE pain  Transfers Overall transfer level: Needs assistance Equipment used: Rolling walker (2 wheeled) Transfers: Sit to/from Stand Sit to Stand: Max assist;+2 physical assistance Stand pivot transfers: +2 physical assistance;Max assist       General transfer comment: Assist for trunk elevation with use of bed pad under hips and maintaining balance transitioning UE suppor to RW  Ambulation/Gait Ambulation/Gait assistance: Max assist;+2 safety/equipment Gait Distance (Feet):  1 Feet Assistive device: Rolling walker (2 wheeled)       General Gait Details: Slow, antalgic pivotal steps from bed to recliner with RW, min-maxA to maintain trunk elevation requiring increased assist with fatigue; assist+2 helpful to move RW. Max cues for sequencing  Stairs            Wheelchair Mobility    Modified Rankin (Stroke Patients Only)       Balance Overall balance assessment: Needs assistance Sitting-balance support: Bilateral upper extremity supported Sitting balance-Leahy Scale: Poor Sitting balance - Comments: max assist initially, progressed to min guard, tolerated sitting EOB >10 min with improving balance/relaxation   Standing balance support: Bilateral upper extremity supported Standing balance-Leahy Scale: Poor Standing balance comment: B UE support and external assist                             Pertinent Vitals/Pain Pain Assessment: Faces Faces Pain Scale: Hurts even more Pain Location: L hip Pain Descriptors / Indicators: Grimacing;Guarding;Moaning Pain Intervention(s): Monitored during session;Repositioned    Home Living Family/patient expects to be discharged to:: Private residence Living Arrangements: Spouse/significant other;Children Available Help at Discharge: Family;Available 24 hours/day Type of Home: House Home Access: Stairs to enter Entrance Stairs-Rails: Right Entrance Stairs-Number of Steps: 3 Home Layout: One level Home Equipment: None      Prior Function Level of Independence: Needs assistance   Gait / Transfers Assistance Needed: walks without a device, denies falls prior to fall leading to admission  ADL's / Homemaking Assistance Needed: husband does cooking, cleaning and driving        Hand Dominance   Dominant Hand: Right  Extremity/Trunk Assessment   Upper Extremity Assessment Upper Extremity Assessment: Generalized weakness    Lower Extremity Assessment Lower Extremity Assessment:  Generalized weakness;LLE deficits/detail LLE Deficits / Details: s/p L femur IM nail; observed knee ext at least 3/5, hip flex <3/5 limited by pain LLE: Unable to fully assess due to pain    Cervical / Trunk Assessment Cervical / Trunk Assessment: Kyphotic  Communication   Communication: HOH  Cognition Arousal/Alertness: Awake/alert Behavior During Therapy: Anxious Overall Cognitive Status: History of cognitive impairments - at baseline                                 General Comments: pt with impaired memory and difficulty sequencing; distracted by anxiety and pain with moving      General Comments      Exercises     Assessment/Plan    PT Assessment Patient needs continued PT services  PT Problem List Decreased strength;Decreased range of motion;Decreased activity tolerance;Decreased balance;Decreased mobility;Decreased knowledge of use of DME;Decreased knowledge of precautions;Pain       PT Treatment Interventions DME instruction;Gait training;Stair training;Functional mobility training;Therapeutic activities;Therapeutic exercise;Balance training;Patient/family education    PT Goals (Current goals can be found in the Care Plan section)  Acute Rehab PT Goals Patient Stated Goal: to return home PT Goal Formulation: With patient Time For Goal Achievement: 06/24/19 Potential to Achieve Goals: Good    Frequency Min 5X/week   Barriers to discharge        Co-evaluation PT/OT/SLP Co-Evaluation/Treatment: Yes Reason for Co-Treatment: For patient/therapist safety;To address functional/ADL transfers PT goals addressed during session: Mobility/safety with mobility;Balance;Proper use of DME OT goals addressed during session: ADL's and self-care;Proper use of Adaptive equipment and DME       AM-PAC PT "6 Clicks" Mobility  Outcome Measure Help needed turning from your back to your side while in a flat bed without using bedrails?: Total Help needed moving from  lying on your back to sitting on the side of a flat bed without using bedrails?: Total Help needed moving to and from a bed to a chair (including a wheelchair)?: A Lot Help needed standing up from a chair using your arms (e.g., wheelchair or bedside chair)?: A Lot Help needed to walk in hospital room?: A Lot Help needed climbing 3-5 steps with a railing? : Total 6 Click Score: 9    End of Session Equipment Utilized During Treatment: Gait belt Activity Tolerance: Patient tolerated treatment well Patient left: in chair;with call bell/phone within reach;with chair alarm set Nurse Communication: Mobility status PT Visit Diagnosis: Other abnormalities of gait and mobility (R26.89);Pain Pain - Right/Left: Left Pain - part of body: Leg    Time: 3474-2595 PT Time Calculation (min) (ACUTE ONLY): 34 min   Charges:   PT Evaluation $PT Eval Moderate Complexity: 1 Mod     Mabeline Caras, PT, DPT Acute Rehabilitation Services  Pager [phone] Office [phone]  Derry Lory 06/10/2019, 12:51 PM

## 2019-06-10 NOTE — Progress Notes (Signed)
Patient ID: Laura Parks, female   DOB: 21-Feb-1930, 85 y.o.   MRN: [id number]  PROGRESS NOTE    Laura Parks  LGX:[id number] DOB: 1929-11-27 DOA: 06/09/2019 PCP: Glenda Chroman, MD   Brief Narrative:  Exam-year-old female with history of mild cognitive impairment, coronary disease, hypertension, diabetes mellitus type 2, unspecified CVA, chronic diastolic CHF, paroxysmal A. fib on Eliquis presented on 06/09/2019 with left hip pain after a fall.  She was found to have left hip fracture for which she underwent surgery on 06/09/2019 by orthopedics.  Assessment & Plan:   Left hip fracture after a mechanical fall -Status post surgical intervention on 06/09/2019 by orthopedics.  Wound care as per orthopedics recommendations. -PT/OT eval.  Might need placement -Pain control.  Bowel regimen. -We will restart Eliquis as orthopedics as per orthopedics recommendations  Paroxysmal A. Fib -Currently rate controlled.  Resume Eliquis.  Chronic disease stage III  -Creatinine stable.  Monitor.  Baseline creatinine ranging from 1.3-1.5  Diabetes mellitus type II -A1c 6.9.  Actos on hold.  Continue CBGs with SSI  Chronic diastolic CHF -EF preserved in June 2 than 20 with grade 2 diastolic dysfunction -Compensated.  Lasix on hold.  Strict input output and daily weights.  History of CAD -Stable.  Continue statin  Probable dementia with mild cognitive impairment -Monitor mental status.  Fall precaution.  Outpatient follow-up   DVT prophylaxis: Start Eliquis Code Status: Full Family Communication: None at bedside Disposition Plan: Might need rehab placement  Consultants: Orthopedics  Procedures:  Cephalomedullary nailing of left intertrochanteric femur fracture on 06/09/2019  Antimicrobials:  Peri-op antibiotic  Subjective: Patient seen and examined at bedside.  She denies worsening hip pain.  Slightly confused.  No overnight fever or vomiting.  Objective: Vitals:   06/10/19 0002 06/10/19  0340 06/10/19 0844 06/10/19 0846  BP: (!) 104/47 (!) 92/46 (!) 85/35 (!) 90/44  Pulse: 72 71 62 (!) 59  Resp: 17 17 16    Temp: 97.6 F (36.4 C) 98.2 F (36.8 C) 98.4 F (36.9 C)   TempSrc: Oral Oral Oral   SpO2: 100% 99% 95%   Weight:      Height:        Intake/Output Summary (Last 24 hours) at 06/10/2019 1045 Last data filed at 06/10/2019 0909 Gross per 24 hour  Intake 1152.54 ml  Output 325 ml  Net 827.54 ml   Filed Weights   06/09/19 0400  Weight: 72.6 kg    Examination:  General exam: Appears calm and comfortable.  Slightly confused to time.  Elderly female lying in bed. Respiratory system: Bilateral decreased breath sounds at bases Cardiovascular system: S1 & S2 heard, Rate controlled Gastrointestinal system: Abdomen is nondistended, soft and nontender. Normal bowel sounds heard. Extremities: No cyanosis, clubbing; trace edema   Data Reviewed: I have personally reviewed following labs and imaging studies  CBC: Recent Labs  Lab 06/09/19 0318 06/09/19 0452 06/10/19 0420  WBC 10.3 10.5 7.2  NEUTROABS 7.6  --   --   HGB 10.7* 11.0* 7.5*  HCT 34.7* 35.7* 23.9*  MCV 90.8 91.3 90.5  PLT 160 163 814*   Basic Metabolic Panel: Recent Labs  Lab 06/09/19 0318 06/09/19 0452 06/10/19 0420  NA 139 139 138  K 5.1 4.8 5.2*  CL 104 103 106  CO2 26 26 25   GLUCOSE 215* 214* 287*  BUN 25* 25* 34*  CREATININE 1.36* 1.32* 1.63*  CALCIUM 8.7* 8.8* 8.5*  MG  --   --  1.7  GFR: Estimated Creatinine Clearance: 22.7 mL/min (A) (by C-G formula based on SCr of 1.63 mg/dL (H)). Liver Function Tests: No results for input(s): AST, ALT, ALKPHOS, BILITOT, PROT, ALBUMIN in the last 168 hours. No results for input(s): LIPASE, AMYLASE in the last 168 hours. No results for input(s): AMMONIA in the last 168 hours. Coagulation Profile: Recent Labs  Lab 06/09/19 0318  INR 1.1   Cardiac Enzymes: No results for input(s): CKTOTAL, CKMB, CKMBINDEX, TROPONINI in the last 168  hours. BNP (last 3 results) No results for input(s): PROBNP in the last 8760 hours. HbA1C: Recent Labs    06/09/19 0452  HGBA1C 6.9*   CBG: Recent Labs  Lab 06/09/19 0815 06/09/19 1251 06/09/19 1703 06/10/19 0010 06/10/19 0524  GLUCAP 166* 135* 136* 301* 274*   Lipid Profile: No results for input(s): CHOL, HDL, LDLCALC, TRIG, CHOLHDL, LDLDIRECT in the last 72 hours. Thyroid Function Tests: No results for input(s): TSH, T4TOTAL, FREET4, T3FREE, THYROIDAB in the last 72 hours. Anemia Panel: No results for input(s): VITAMINB12, FOLATE, FERRITIN, TIBC, IRON, RETICCTPCT in the last 72 hours. Sepsis Labs: No results for input(s): PROCALCITON, LATICACIDVEN in the last 168 hours.  Recent Results (from the past 240 hour(s))  SARS Coronavirus 2 [facility] order, Performed in [facility] hospital lab) Nasopharyngeal Nasopharyngeal Swab     Status: None   Collection Time: 06/09/19  3:24 AM   Specimen: Nasopharyngeal Swab  Result Value Ref Range Status   SARS Coronavirus 2 NEGATIVE NEGATIVE Final    Comment: (NOTE) If result is NEGATIVE SARS-CoV-2 target nucleic acids are NOT DETECTED. The SARS-CoV-2 RNA is generally detectable in upper and lower  respiratory specimens during the acute phase of infection. The lowest  concentration of SARS-CoV-2 viral copies this assay can detect is 250  copies / mL. A negative result does not preclude SARS-CoV-2 infection  and should not be used as the sole basis for treatment or other  patient management decisions.  A negative result may occur with  improper specimen collection / handling, submission of specimen other  than nasopharyngeal swab, presence of viral mutation(s) within the  areas targeted by this assay, and inadequate number of viral copies  (<250 copies / mL). A negative result must be combined with clinical  observations, patient history, and epidemiological information. If result is POSITIVE SARS-CoV-2 target nucleic acids are  DETECTED. The SARS-CoV-2 RNA is generally detectable in upper and lower  respiratory specimens dur ing the acute phase of infection.  Positive  results are indicative of active infection with SARS-CoV-2.  Clinical  correlation with patient history and other diagnostic information is  necessary to determine patient infection status.  Positive results do  not rule out bacterial infection or co-infection with other viruses. If result is PRESUMPTIVE POSTIVE SARS-CoV-2 nucleic acids MAY BE PRESENT.   A presumptive positive result was obtained on the submitted specimen  and confirmed on repeat testing.  While 2019 novel coronavirus  (SARS-CoV-2) nucleic acids may be present in the submitted sample  additional confirmatory testing may be necessary for epidemiological  and / or clinical management purposes  to differentiate between  SARS-CoV-2 and other Sarbecovirus currently known to infect humans.  If clinically indicated additional testing with an alternate test  methodology [phone]) is advised. The SARS-CoV-2 RNA is generally  detectable in upper and lower respiratory sp ecimens during the acute  phase of infection. The expected result is Negative. Fact Sheet for Patients:  StrictlyIdeas.no Fact Sheet for Healthcare Providers: BankingDealers.co.za This test  is not yet approved or cleared by the [place] and has been authorized for detection and/or diagnosis of SARS-CoV-2 by FDA under an Emergency Use Authorization (EUA).  This EUA will remain in effect (meaning this test can be used) for the duration of the COVID-19 declaration under Section 564(b)(1) of the Act, 21 U.S.C. section 360bbb-3(b)(1), unless the authorization is terminated or revoked sooner. Performed at [facility], [place] [address], [place], [place] [address]          Radiology Studies: Dg Chest [place] 1 View  Result Date: 06/09/2019 CLINICAL DATA:   85 year old female with preop exam. EXAM: PORTABLE CHEST 1 VIEW COMPARISON:  Chest radiograph dated 10/30/2017 FINDINGS: Mild diffuse interstitial coarsening and chronic bronchitic changes. No focal consolidation, pleural effusion, pneumothorax. Mild cardiomegaly. Calcification of the mitral annulus. Atherosclerotic calcification of the aorta. Osteopenia with degenerative changes of the spine. No acute osseous pathology. IMPRESSION: 1. No acute cardiopulmonary process. 2. Mild cardiomegaly. Electronically Signed   By: Anner Crete M.D.   On: 06/09/2019 03:32   Dg C-arm 1-60 Min  Result Date: 06/09/2019 CLINICAL DATA:  Left intertrochanteric femur fracture ORIF. EXAM: DG C-ARM 1-60 MIN; LEFT FEMUR 2 VIEWS FLUOROSCOPY TIME:  Fluoroscopy Time:  1 minutes, 42 seconds. COMPARISON:  Left hip x-rays from same day. FINDINGS: Multiple intraoperative fluoroscopic images demonstrate interval cephalomedullary rod fixation of the left intertrochanteric femur fracture. Alignment is near anatomic. Prior right patella ORIF. IMPRESSION: 1. Intraoperative fluoroscopic guidance for left intertrochanteric femur fracture ORIF. Electronically Signed   By: Titus Dubin M.D.   On: 06/09/2019 16:38   Dg Hip Unilat W Or W/o Pelvis 2-3 Views Left  Result Date: 06/09/2019 CLINICAL DATA:  Left hip fracture, postop EXAM: DG HIP (WITH OR WITHOUT PELVIS) 2-3V LEFT COMPARISON:  06/09/2019 FINDINGS: Changes of internal fixation across the left femoral intertrochanteric fracture. No hardware bony complicating feature. Anatomic alignment. IMPRESSION: Internal fixation.  No complicating feature. Electronically Signed   By: Rolm Baptise M.D.   On: 06/09/2019 19:00   Dg Hip Unilat With Pelvis 2-3 Views Left  Result Date: 06/09/2019 CLINICAL DATA:  Left hip fracture EXAM: DG HIP (WITH OR WITHOUT PELVIS) 2-3V LEFT COMPARISON:  CT 06/08/2019 FINDINGS: Redemonstration of a mildly displaced fracture of the proximal left femur with  intertrochanteric component. Similar degree of angulation and foreshortening. Alignment unchanged. No involvement of the femoral head articular surface. No dislocation. Bones demineralized. No additional fractures. Lower lumbar spondylosis. Vascular calcifications. IMPRESSION: No significant change in appearance or alignment of comminuted intertrochanteric proximal left femur fracture. Electronically Signed   By: Davina Poke M.D.   On: 06/09/2019 08:03   Dg Femur Min 2 Views Left  Result Date: 06/09/2019 CLINICAL DATA:  Left intertrochanteric femur fracture ORIF. EXAM: DG C-ARM 1-60 MIN; LEFT FEMUR 2 VIEWS FLUOROSCOPY TIME:  Fluoroscopy Time:  1 minutes, 42 seconds. COMPARISON:  Left hip x-rays from same day. FINDINGS: Multiple intraoperative fluoroscopic images demonstrate interval cephalomedullary rod fixation of the left intertrochanteric femur fracture. Alignment is near anatomic. Prior right patella ORIF. IMPRESSION: 1. Intraoperative fluoroscopic guidance for left intertrochanteric femur fracture ORIF. Electronically Signed   By: Titus Dubin M.D.   On: 06/09/2019 16:38        Scheduled Meds: . acetaminophen  650 mg Oral Q6H  . amLODipine  5 mg Oral Daily  . atorvastatin  10 mg Oral q1800  . docusate sodium  100 mg Oral BID  . gabapentin  100 mg Oral TID  .  insulin aspart  0-9 Units Subcutaneous Q4H   Continuous Infusions: .  ceFAZolin (ANCEF) IV 2 g (06/10/19 0814)  . lactated ringers Stopped (06/09/19 1554)  . methocarbamol (ROBAXIN) IV       LOS: 1 day        Aline August, MD [facility] 06/10/2019, 10:45 AM

## 2019-06-11 DIAGNOSIS — N179 Acute kidney failure, unspecified: Secondary | ICD-10-CM

## 2019-06-11 DIAGNOSIS — D62 Acute posthemorrhagic anemia: Secondary | ICD-10-CM

## 2019-06-11 LAB — CBC
HCT: 20.3 % — ABNORMAL LOW (ref 36.0–46.0)
Hemoglobin: 6.4 g/dL — CL (ref 12.0–15.0)
MCH: 28.4 pg (ref 26.0–34.0)
MCHC: 31.5 g/dL (ref 30.0–36.0)
MCV: 90.2 fL (ref 80.0–100.0)
Platelets: 103 10*3/uL — ABNORMAL LOW (ref 150–400)
RBC: 2.25 MIL/uL — ABNORMAL LOW (ref 3.87–5.11)
RDW: 14.6 % (ref 11.5–15.5)
WBC: 7.4 10*3/uL (ref 4.0–10.5)
nRBC: 0 % (ref 0.0–0.2)

## 2019-06-11 LAB — VITAMIN D 25 HYDROXY (VIT D DEFICIENCY, FRACTURES): Vit D, 25-Hydroxy: 29.1 ng/mL — ABNORMAL LOW (ref 30.0–100.0)

## 2019-06-11 LAB — PREPARE RBC (CROSSMATCH)

## 2019-06-11 LAB — BASIC METABOLIC PANEL
Anion gap: 6 (ref 5–15)
BUN: 48 mg/dL — ABNORMAL HIGH (ref 8–23)
CO2: 26 mmol/L (ref 22–32)
Calcium: 8.1 mg/dL — ABNORMAL LOW (ref 8.9–10.3)
Chloride: 104 mmol/L (ref 98–111)
Creatinine, Ser: 1.91 mg/dL — ABNORMAL HIGH (ref 0.44–1.00)
GFR calc Af Amer: 27 mL/min — ABNORMAL LOW (ref >60)
GFR calc non Af Amer: 23 mL/min — ABNORMAL LOW (ref >60)
Glucose, Bld: 124 mg/dL — ABNORMAL HIGH (ref 70–99)
Potassium: 4.3 mmol/L (ref 3.5–5.1)
Sodium: 136 mmol/L (ref 135–145)

## 2019-06-11 LAB — MAGNESIUM: Magnesium: 1.7 mg/dL (ref 1.7–2.4)

## 2019-06-11 LAB — GLUCOSE, CAPILLARY
Glucose-Capillary: 135 mg/dL — ABNORMAL HIGH (ref 70–99)
Glucose-Capillary: 147 mg/dL — ABNORMAL HIGH (ref 70–99)
Glucose-Capillary: 162 mg/dL — ABNORMAL HIGH (ref 70–99)
Glucose-Capillary: 245 mg/dL — ABNORMAL HIGH (ref 70–99)
Glucose-Capillary: 95 mg/dL (ref 70–99)

## 2019-06-11 MED ORDER — SODIUM CHLORIDE 0.9% IV SOLUTION: Freq: Once | INTRAVENOUS | Status: DC

## 2019-06-11 NOTE — Progress Notes (Signed)
Patient ID: Laura Parks, female   DOB: 06-16-1930, 85 y.o.   MRN: [id number]  PROGRESS NOTE    Laura Parks  ION:[id number] DOB: [birth year] DOA: 06/09/2019 PCP: Glenda Chroman, MD   Brief Narrative:  Exam-year-old female with history of mild cognitive impairment, coronary disease, hypertension, diabetes mellitus type 2, unspecified CVA, chronic diastolic CHF, paroxysmal A. fib on Eliquis presented on 06/09/2019 with left hip pain after a fall.  She was found to have left hip fracture for which she underwent surgery on 06/09/2019 by orthopedics.  Assessment & Plan:   Left hip fracture after a mechanical fall -Status post surgical intervention on 06/09/2019 by orthopedics.  Wound care as per orthopedics recommendations. -PT/OT eval. PT recommending CIR placement. -Pain control.  Bowel regimen.  Acute blood loss anemia -Probably perioperative blood loss -Hemoglobin 6.4 today.  Will transfuse 1 unit of packed red cells.  Paroxysmal A. Fib -Currently rate controlled.  Continue Eliquis.  Acute kidney injury on chronic disease stage III  -Creatinine 1.91 today.  Will transfuse packed red cells.  Repeat a.m. creatinine.  Baseline creatinine ranging from 1.3-1.5  Diabetes mellitus type II -A1c 6.9.  Actos on hold.  Continue CBGs with SSI  Chronic diastolic CHF -EF preserved in June 2 than 20 with grade 2 diastolic dysfunction -Compensated.  Lasix on hold.  Strict input output and daily weights.  History of CAD -Stable.  Continue statin  Probable dementia with mild cognitive impairment -Monitor mental status.  Fall precaution.  Outpatient follow-up   DVT prophylaxis:  Eliquis Code Status: Full Family Communication: None at bedside Disposition Plan: CIR once bed is available  Consultants: Orthopedics  Procedures:  Cephalomedullary nailing of left intertrochanteric femur fracture on 06/09/2019  Antimicrobials:  Peri-op antibiotic  Subjective: Patient seen and examined at  bedside.  Poor historian.  No overnight fever, vomiting reported.  No overnight black or bloody bowel movement reported per nursing staff.  Objective: Vitals:   06/10/19 1211 06/10/19 1634 06/10/19 1936 06/11/19 0503  BP: (!) 97/44 (!) 102/33 (!) 135/51 (!) 103/41  Pulse: 74 80 81 81  Resp: 16 16 18 19   Temp: 98.8 F (37.1 C) 98.1 F (36.7 C) 98.3 F (36.8 C) 98.1 F (36.7 C)  TempSrc: Oral Oral Oral Oral  SpO2: 96% 93% 97% (!) 88%  Weight:      Height:        Intake/Output Summary (Last 24 hours) at 06/11/2019 0732 Last data filed at 06/11/2019 0503 Gross per 24 hour  Intake 642.58 ml  Output 150 ml  Net 492.58 ml   Filed Weights   06/09/19 0400  Weight: 72.6 kg    Examination:  General exam: No distress.  Slightly confused to time.  Elderly female lying in bed. Respiratory system: Bilateral decreased breath sounds at bases, no wheezing Cardiovascular system: Rate controlled, S1-S2 heard Gastrointestinal system: Abdomen is nondistended, soft and nontender. Normal bowel sounds heard. Extremities: No cyanosis; trace edema   Data Reviewed: I have personally reviewed following labs and imaging studies  CBC: Recent Labs  Lab 06/09/19 0318 06/09/19 0452 06/10/19 0420 06/11/19 0340  WBC 10.3 10.5 7.2 7.4  NEUTROABS 7.6  --   --   --   HGB 10.7* 11.0* 7.5* 6.4*  HCT 34.7* 35.7* 23.9* 20.3*  MCV 90.8 91.3 90.5 90.2  PLT 160 163 115* 244*   Basic Metabolic Panel: Recent Labs  Lab 06/09/19 0318 06/09/19 0452 06/10/19 0420 06/11/19 0340  NA 139 139 138 136  K 5.1 4.8 5.2* 4.3  CL 104 103 106 104  CO2 26 26 25 26   GLUCOSE 215* 214* 287* 124*  BUN 25* 25* 34* 48*  CREATININE 1.36* 1.32* 1.63* 1.91*  CALCIUM 8.7* 8.8* 8.5* 8.1*  MG  --   --  1.7 1.7   GFR: Estimated Creatinine Clearance: 19.4 mL/min (A) (by C-G formula based on SCr of 1.91 mg/dL (H)). Liver Function Tests: No results for input(s): AST, ALT, ALKPHOS, BILITOT, PROT, ALBUMIN in the last 168  hours. No results for input(s): LIPASE, AMYLASE in the last 168 hours. No results for input(s): AMMONIA in the last 168 hours. Coagulation Profile: Recent Labs  Lab 06/09/19 0318  INR 1.1   Cardiac Enzymes: No results for input(s): CKTOTAL, CKMB, CKMBINDEX, TROPONINI in the last 168 hours. BNP (last 3 results) No results for input(s): PROBNP in the last 8760 hours. HbA1C: Recent Labs    06/09/19 0452  HGBA1C 6.9*   CBG: Recent Labs  Lab 06/10/19 0524 06/10/19 1441 06/10/19 1631 06/10/19 2057 06/11/19 0017  GLUCAP 274* 194* 204* 250* 245*   Lipid Profile: No results for input(s): CHOL, HDL, LDLCALC, TRIG, CHOLHDL, LDLDIRECT in the last 72 hours. Thyroid Function Tests: No results for input(s): TSH, T4TOTAL, FREET4, T3FREE, THYROIDAB in the last 72 hours. Anemia Panel: No results for input(s): VITAMINB12, FOLATE, FERRITIN, TIBC, IRON, RETICCTPCT in the last 72 hours. Sepsis Labs: No results for input(s): PROCALCITON, LATICACIDVEN in the last 168 hours.  Recent Results (from the past 240 hour(s))  SARS Coronavirus 2 [facility] order, Performed in [facility] hospital lab) Nasopharyngeal Nasopharyngeal Swab     Status: None   Collection Time: 06/09/19  3:24 AM   Specimen: Nasopharyngeal Swab  Result Value Ref Range Status   SARS Coronavirus 2 NEGATIVE NEGATIVE Final    Comment: (NOTE) If result is NEGATIVE SARS-CoV-2 target nucleic acids are NOT DETECTED. The SARS-CoV-2 RNA is generally detectable in upper and lower  respiratory specimens during the acute phase of infection. The lowest  concentration of SARS-CoV-2 viral copies this assay can detect is 250  copies / mL. A negative result does not preclude SARS-CoV-2 infection  and should not be used as the sole basis for treatment or other  patient management decisions.  A negative result may occur with  improper specimen collection / handling, submission of specimen other  than nasopharyngeal swab, presence of viral  mutation(s) within the  areas targeted by this assay, and inadequate number of viral copies  (<250 copies / mL). A negative result must be combined with clinical  observations, patient history, and epidemiological information. If result is POSITIVE SARS-CoV-2 target nucleic acids are DETECTED. The SARS-CoV-2 RNA is generally detectable in upper and lower  respiratory specimens dur ing the acute phase of infection.  Positive  results are indicative of active infection with SARS-CoV-2.  Clinical  correlation with patient history and other diagnostic information is  necessary to determine patient infection status.  Positive results do  not rule out bacterial infection or co-infection with other viruses. If result is PRESUMPTIVE POSTIVE SARS-CoV-2 nucleic acids MAY BE PRESENT.   A presumptive positive result was obtained on the submitted specimen  and confirmed on repeat testing.  While 2019 novel coronavirus  (SARS-CoV-2) nucleic acids may be present in the submitted sample  additional confirmatory testing may be necessary for epidemiological  and / or clinical management purposes  to differentiate between  SARS-CoV-2 and other Sarbecovirus currently known to infect humans.  If  clinically indicated additional testing with an alternate test  methodology [phone]) is advised. The SARS-CoV-2 RNA is generally  detectable in upper and lower respiratory sp ecimens during the acute  phase of infection. The expected result is Negative. Fact Sheet for Patients:  StrictlyIdeas.no Fact Sheet for Healthcare Providers: BankingDealers.co.za This test is not yet approved or cleared by the [place] FDA and has been authorized for detection and/or diagnosis of SARS-CoV-2 by FDA under an Emergency Use Authorization (EUA).  This EUA will remain in effect (meaning this test can be used) for the duration of the COVID-19 declaration under Section 564(b)(1)  of the Act, 21 U.S.C. section 360bbb-3(b)(1), unless the authorization is terminated or revoked sooner. Performed at [facility], [place] [address], [place],  [address]          Radiology Studies: Dg C-arm 1-60 Min  Result Date: 06/09/2019 CLINICAL DATA:  Left intertrochanteric femur fracture ORIF. EXAM: DG C-ARM 1-60 MIN; LEFT FEMUR 2 VIEWS FLUOROSCOPY TIME:  Fluoroscopy Time:  1 minutes, 42 seconds. COMPARISON:  Left hip x-rays from same day. FINDINGS: Multiple intraoperative fluoroscopic images demonstrate interval cephalomedullary rod fixation of the left intertrochanteric femur fracture. Alignment is near anatomic. Prior right patella ORIF. IMPRESSION: 1. Intraoperative fluoroscopic guidance for left intertrochanteric femur fracture ORIF. Electronically Signed   By: Titus Dubin M.D.   On: 06/09/2019 16:38   Dg Hip Unilat W Or W/o Pelvis 2-3 Views Left  Result Date: 06/09/2019 CLINICAL DATA:  Left hip fracture, postop EXAM: DG HIP (WITH OR WITHOUT PELVIS) 2-3V LEFT COMPARISON:  06/09/2019 FINDINGS: Changes of internal fixation across the left femoral intertrochanteric fracture. No hardware bony complicating feature. Anatomic alignment. IMPRESSION: Internal fixation.  No complicating feature. Electronically Signed   By: Rolm Baptise M.D.   On: 06/09/2019 19:00   Dg Femur Min 2 Views Left  Result Date: 06/09/2019 CLINICAL DATA:  Left intertrochanteric femur fracture ORIF. EXAM: DG C-ARM 1-60 MIN; LEFT FEMUR 2 VIEWS FLUOROSCOPY TIME:  Fluoroscopy Time:  1 minutes, 42 seconds. COMPARISON:  Left hip x-rays from same day. FINDINGS: Multiple intraoperative fluoroscopic images demonstrate interval cephalomedullary rod fixation of the left intertrochanteric femur fracture. Alignment is near anatomic. Prior right patella ORIF. IMPRESSION: 1. Intraoperative fluoroscopic guidance for left intertrochanteric femur fracture ORIF. Electronically Signed   By: Titus Dubin M.D.   On:  06/09/2019 16:38        Scheduled Meds: . sodium chloride   Intravenous Once  . acetaminophen  650 mg Oral Q6H  . amLODipine  5 mg Oral Daily  . apixaban  2.5 mg Oral BID  . atorvastatin  10 mg Oral q1800  . docusate sodium  100 mg Oral BID  . gabapentin  100 mg Oral TID  . insulin aspart  0-9 Units Subcutaneous Q4H   Continuous Infusions: . lactated ringers Stopped (06/09/19 1554)  . methocarbamol (ROBAXIN) IV       LOS: 2 days        Aline August, MD [facility] 06/11/2019, 7:32 AM

## 2019-06-11 NOTE — Progress Notes (Signed)
SPORTS MEDICINE AND JOINT REPLACEMENT  Lara Mulch, MD    Carlyon Shadow, PA-C [place], [place], [place]  [address]                             [phone]   PROGRESS NOTE  Subjective:  negative for Chest Pain  negative for Shortness of Breath  negative for Nausea/Vomiting   negative for Calf Pain  negative for Bowel Movement   Tolerating Diet: yes         Patient reports pain as 3 on 0-10 scale.    Objective: Vital signs in last 24 hours:    Patient Vitals for the past 24 hrs:  BP Temp Temp src Pulse Resp SpO2  06/11/19 0503 (!) 103/41 98.1 F (36.7 C) Oral 81 19 (!) 88 %  06/10/19 1936 (!) 135/51 98.3 F (36.8 C) Oral 81 18 97 %  06/10/19 1634 (!) 102/33 98.1 F (36.7 C) Oral 80 16 93 %  06/10/19 1211 (!) 97/44 98.8 F (37.1 C) Oral 74 16 96 %  06/10/19 0846 (!) 90/44 - - (!) 59 - -  06/10/19 0844 (!) 85/35 98.4 F (36.9 C) Oral 62 16 95 %    @flow {1959:LAST@   Intake/Output from previous day:   09/25 0701 - 09/26 0700 In: 642.6 [P.O.:480] Out: 150 [Urine:150]   Intake/Output this shift:   No intake/output data recorded.   Intake/Output      09/25 0701 - 09/26 0700 09/26 0701 - 09/27 0700   P.O. 480    I.V. (mL/kg) 0 (0)    IV Piggyback 162.6    Total Intake(mL/kg) 642.6 (8.9)    Urine (mL/kg/hr) 150 (0.1)    Blood     Total Output 150    Net +492.6         Urine Occurrence 2 x    Stool Occurrence 1 x       LABORATORY DATA: Recent Labs    06/09/19 0318 06/09/19 0452 06/10/19 0420 06/11/19 0340  WBC 10.3 10.5 7.2 7.4  HGB 10.7* 11.0* 7.5* 6.4*  HCT 34.7* 35.7* 23.9* 20.3*  PLT 160 163 115* 103*   Recent Labs    06/09/19 0318 06/09/19 0452 06/10/19 0420 06/11/19 0340  NA 139 139 138 136  K 5.1 4.8 5.2* 4.3  CL 104 103 106 104  CO2 26 26 25 26   BUN 25* 25* 34* 48*  CREATININE 1.36* 1.32* 1.63* 1.91*  GLUCOSE 215* 214* 287* 124*  CALCIUM 8.7* 8.8* 8.5* 8.1*   Lab Results  Component Value Date   INR 1.1 06/09/2019   INR 1.16 02/01/2016   INR 1.06 01/30/2016    Examination:  General appearance: alert, cooperative and no distress Extremities: extremities normal, atraumatic, no cyanosis or edema  Wound Exam: clean, dry, intact   Drainage:  None: wound tissue dry  Motor Exam: Quadriceps and Hamstrings Intact  Sensory Exam: Superficial Peroneal, Deep Peroneal and Tibial normal   Assessment:    2 Days Post-Op  Procedure(s) (LRB): INTRAMEDULLARY (IM) NAIL INTERTROCHANTRIC (Left)  ADDITIONAL DIAGNOSIS:  Principal Problem:   Closed left hip fracture (HCC) Active Problems:   Type 2 diabetes mellitus (HCC)   Stroke (HCC)   PAF (paroxysmal atrial fibrillation) (HCC)   Vascular dementia (HCC)   Renal insufficiency     Plan: Physical Therapy as ordered Weight Bearing as Tolerated (WBAT)  Patient doing well, resting in bed and alert/oriented. I removed  dressing and wound look good with steristrips and no drainage. Will continue to follow over weekend. Agree with CIR placement. Medicine following   Donia Ast 06/11/2019, 7:10 AM

## 2019-06-11 NOTE — Progress Notes (Signed)
Patient's sister in law states that patient's brown loafers and pajama pants that she came to the hospital in are missing. Contacted ED, OR, [facility], Short Stay and none of these departments have her belongings.

## 2019-06-11 NOTE — Progress Notes (Signed)
Notified on call critical HGB value-6.4

## 2019-06-12 LAB — GLUCOSE, CAPILLARY
Glucose-Capillary: 126 mg/dL — ABNORMAL HIGH (ref 70–99)
Glucose-Capillary: 151 mg/dL — ABNORMAL HIGH (ref 70–99)
Glucose-Capillary: 153 mg/dL — ABNORMAL HIGH (ref 70–99)
Glucose-Capillary: 157 mg/dL — ABNORMAL HIGH (ref 70–99)
Glucose-Capillary: 171 mg/dL — ABNORMAL HIGH (ref 70–99)
Glucose-Capillary: 87 mg/dL (ref 70–99)

## 2019-06-12 LAB — BPAM RBC
Blood Product Expiration Date: 202009302359
ISSUE DATE / TIME: 202009261330
Unit Type and Rh: 6200

## 2019-06-12 LAB — TYPE AND SCREEN
ABO/RH(D): A POS
Antibody Screen: NEGATIVE
Unit division: 0

## 2019-06-12 LAB — BASIC METABOLIC PANEL
Anion gap: 9 (ref 5–15)
BUN: 42 mg/dL — ABNORMAL HIGH (ref 8–23)
CO2: 26 mmol/L (ref 22–32)
Calcium: 8.6 mg/dL — ABNORMAL LOW (ref 8.9–10.3)
Chloride: 104 mmol/L (ref 98–111)
Creatinine, Ser: 1.52 mg/dL — ABNORMAL HIGH (ref 0.44–1.00)
GFR calc Af Amer: 35 mL/min — ABNORMAL LOW (ref >60)
GFR calc non Af Amer: 31 mL/min — ABNORMAL LOW (ref >60)
Glucose, Bld: 171 mg/dL — ABNORMAL HIGH (ref 70–99)
Potassium: 4.8 mmol/L (ref 3.5–5.1)
Sodium: 139 mmol/L (ref 135–145)

## 2019-06-12 LAB — HEMOGLOBIN AND HEMATOCRIT, BLOOD
HCT: 26.3 % — ABNORMAL LOW (ref 36.0–46.0)
Hemoglobin: 8.8 g/dL — ABNORMAL LOW (ref 12.0–15.0)

## 2019-06-12 MED ORDER — INFLUENZA VAC A&B SA ADJ QUAD 0.5 ML IM PRSY
0.5000 mL | PREFILLED_SYRINGE | INTRAMUSCULAR | Status: AC
  Administered 2019-06-13: 0.5 mL via INTRAMUSCULAR
  Filled 2019-06-12 (×2): qty 0.5

## 2019-06-12 NOTE — Progress Notes (Signed)
Patient ID: Laura Parks, female   DOB: June 29, 1930, 85 y.o.   MRN: [id number]  PROGRESS NOTE    ALWILDA GILLAND  UXN:[id number] DOB: 12-29-1929 DOA: 06/09/2019 PCP: Glenda Chroman, MD   Brief Narrative:  Exam-year-old female with history of mild cognitive impairment, coronary disease, hypertension, diabetes mellitus type 2, unspecified CVA, chronic diastolic CHF, paroxysmal A. fib on Eliquis presented on 06/09/2019 with left hip pain after a fall.  She was found to have left hip fracture for which she underwent surgery on 06/09/2019 by orthopedics.  Assessment & Plan:   Left hip fracture after a mechanical fall -Status post surgical intervention on 06/09/2019 by orthopedics.  Wound care as per orthopedics recommendations. -PT/OT eval. PT recommending CIR placement. -Pain control.  Bowel regimen.  Acute blood loss anemia -Probably perioperative blood loss -Hemoglobin 6.4 on 06/11/2019.  Status post 1 unit packed red cell transfusion on 06/11/2019.  Hemoglobin 8.8 this morning.  Monitor  Paroxysmal A. Fib -Currently rate controlled.  Continue Eliquis.  Acute kidney injury on chronic disease stage III  -Improving.  Creatinine 1.52 today.  Repeat a.m. creatinine.  Baseline creatinine ranging from 1.3-1.5  Diabetes mellitus type II -A1c 6.9.  Actos on hold.  Continue CBGs with SSI  Chronic diastolic CHF -EF preserved in June 2 than 20 with grade 2 diastolic dysfunction -Compensated.  Lasix on hold.  Strict input output and daily weights.  History of CAD -Stable.  Continue statin  Probable dementia with mild cognitive impairment -Monitor mental status.  Fall precaution.  Outpatient follow-up   DVT prophylaxis:  Eliquis Code Status: Full Family Communication: None at bedside Disposition Plan: CIR once bed is available  Consultants: Orthopedics  Procedures:  Cephalomedullary nailing of left intertrochanteric femur fracture on 06/09/2019  Antimicrobials:  Peri-op antibiotic   Subjective: Patient seen and examined at bedside.  Poor historian.  Denies overnight fever or vomiting or worsening lower extremity pain.   Objective: Vitals:   06/11/19 1412 06/11/19 1736 06/11/19 1930 06/12/19 0325  BP: (!) 109/51 124/60 (!) 117/54 (!) 144/62  Pulse: 81 86 75 70  Resp: 16 16 15 16   Temp: 98.4 F (36.9 C) 97.6 F (36.4 C) 98.6 F (37 C) 97.9 F (36.6 C)  TempSrc: Oral Oral Oral Oral  SpO2: 98% 100% 96% 97%  Weight:      Height:        Intake/Output Summary (Last 24 hours) at 06/12/2019 0750 Last data filed at 06/12/2019 0100 Gross per 24 hour  Intake 720 ml  Output 1550 ml  Net -830 ml   Filed Weights   06/09/19 0400  Weight: 72.6 kg    Examination:  General exam: No acute distress.  Slightly confused to time.  Elderly female lying in bed. Respiratory system: Bilateral decreased breath sounds at bases with some scattered crackles Cardiovascular system: S1-S2 heard, rate controlled Gastrointestinal system: Abdomen is nondistended, soft and nontender. Normal bowel sounds heard. Extremities: No cyanosis; trace edema   Data Reviewed: I have personally reviewed following labs and imaging studies  CBC: Recent Labs  Lab 06/09/19 0318 06/09/19 0452 06/10/19 0420 06/11/19 0340 06/12/19 0714  WBC 10.3 10.5 7.2 7.4  --   NEUTROABS 7.6  --   --   --   --   HGB 10.7* 11.0* 7.5* 6.4* 8.8*  HCT 34.7* 35.7* 23.9* 20.3* 26.3*  MCV 90.8 91.3 90.5 90.2  --   PLT 160 163 115* 103*  --    Basic Metabolic Panel: Recent  Labs  Lab 06/09/19 0318 06/09/19 0452 06/10/19 0420 06/11/19 0340 06/12/19 0336  NA 139 139 138 136 139  K 5.1 4.8 5.2* 4.3 4.8  CL 104 103 106 104 104  CO2 26 26 25 26 26   GLUCOSE 215* 214* 287* 124* 171*  BUN 25* 25* 34* 48* 42*  CREATININE 1.36* 1.32* 1.63* 1.91* 1.52*  CALCIUM 8.7* 8.8* 8.5* 8.1* 8.6*  MG  --   --  1.7 1.7  --    GFR: Estimated Creatinine Clearance: 24.3 mL/min (A) (by C-G formula based on SCr of 1.52 mg/dL  (H)). Liver Function Tests: No results for input(s): AST, ALT, ALKPHOS, BILITOT, PROT, ALBUMIN in the last 168 hours. No results for input(s): LIPASE, AMYLASE in the last 168 hours. No results for input(s): AMMONIA in the last 168 hours. Coagulation Profile: Recent Labs  Lab 06/09/19 0318  INR 1.1   Cardiac Enzymes: No results for input(s): CKTOTAL, CKMB, CKMBINDEX, TROPONINI in the last 168 hours. BNP (last 3 results) No results for input(s): PROBNP in the last 8760 hours. HbA1C: No results for input(s): HGBA1C in the last 72 hours. CBG: Recent Labs  Lab 06/11/19 1202 06/11/19 1634 06/11/19 2012 06/12/19 0017 06/12/19 0413  GLUCAP 135* 147* 162* 171* 157*   Lipid Profile: No results for input(s): CHOL, HDL, LDLCALC, TRIG, CHOLHDL, LDLDIRECT in the last 72 hours. Thyroid Function Tests: No results for input(s): TSH, T4TOTAL, FREET4, T3FREE, THYROIDAB in the last 72 hours. Anemia Panel: No results for input(s): VITAMINB12, FOLATE, FERRITIN, TIBC, IRON, RETICCTPCT in the last 72 hours. Sepsis Labs: No results for input(s): PROCALCITON, LATICACIDVEN in the last 168 hours.  Recent Results (from the past 240 hour(s))  SARS Coronavirus 2 [facility] order, Performed in  [facility] hospital lab) Nasopharyngeal Nasopharyngeal Swab     Status: None   Collection Time: 06/09/19  3:24 AM   Specimen: Nasopharyngeal Swab  Result Value Ref Range Status   SARS Coronavirus 2 NEGATIVE NEGATIVE Final    Comment: (NOTE) If result is NEGATIVE SARS-CoV-2 target nucleic acids are NOT DETECTED. The SARS-CoV-2 RNA is generally detectable in upper and lower  respiratory specimens during the acute phase of infection. The lowest  concentration of SARS-CoV-2 viral copies this assay can detect is 250  copies / mL. A negative result does not preclude SARS-CoV-2 infection  and should not be used as the sole basis for treatment or other  patient management decisions.  A negative result may occur with   improper specimen collection / handling, submission of specimen other  than nasopharyngeal swab, presence of viral mutation(s) within the  areas targeted by this assay, and inadequate number of viral copies  (<250 copies / mL). A negative result must be combined with clinical  observations, patient history, and epidemiological information. If result is POSITIVE SARS-CoV-2 target nucleic acids are DETECTED. The SARS-CoV-2 RNA is generally detectable in upper and lower  respiratory specimens dur ing the acute phase of infection.  Positive  results are indicative of active infection with SARS-CoV-2.  Clinical  correlation with patient history and other diagnostic information is  necessary to determine patient infection status.  Positive results do  not rule out bacterial infection or co-infection with other viruses. If result is PRESUMPTIVE POSTIVE SARS-CoV-2 nucleic acids MAY BE PRESENT.   A presumptive positive result was obtained on the submitted specimen  and confirmed on repeat testing.  While 2019 novel coronavirus  (SARS-CoV-2) nucleic acids may be present in the submitted sample  additional confirmatory  testing may be necessary for epidemiological  and / or clinical management purposes  to differentiate between  SARS-CoV-2 and other Sarbecovirus currently known to infect humans.  If clinically indicated additional testing with an alternate test  methodology [phone]) is advised. The SARS-CoV-2 RNA is generally  detectable in upper and lower respiratory sp ecimens during the acute  phase of infection. The expected result is Negative. Fact Sheet for Patients:  StrictlyIdeas.no Fact Sheet for Healthcare Providers: BankingDealers.co.za This test is not yet approved or cleared by the [place] FDA and has been authorized for detection and/or diagnosis of SARS-CoV-2 by FDA under an Emergency Use Authorization (EUA).  This EUA will  remain in effect (meaning this test can be used) for the duration of the COVID-19 declaration under Section 564(b)(1) of the Act, 21 U.S.C. section 360bbb-3(b)(1), unless the authorization is terminated or revoked sooner. Performed at [facility], [place] [address], [place], [place] [address]          Radiology Studies: No results found.      Scheduled Meds: . sodium chloride   Intravenous Once  . acetaminophen  650 mg Oral Q6H  . amLODipine  5 mg Oral Daily  . apixaban  2.5 mg Oral BID  . atorvastatin  10 mg Oral q1800  . docusate sodium  100 mg Oral BID  . gabapentin  100 mg Oral TID  . insulin aspart  0-9 Units Subcutaneous Q4H   Continuous Infusions: . lactated ringers Stopped (06/09/19 1554)  . methocarbamol (ROBAXIN) IV       LOS: 3 days        Aline August, MD [facility] 06/12/2019, 7:50 AM

## 2019-06-12 NOTE — Plan of Care (Signed)

## 2019-06-12 NOTE — Progress Notes (Signed)
SPORTS MEDICINE AND JOINT REPLACEMENT  Laura Mulch, MD    Carlyon Shadow, PA-C [place], [place], [place]  [address]                             [phone]   PROGRESS NOTE  Subjective:  negative for Chest Pain  negative for Shortness of Breath  negative for Nausea/Vomiting   negative for Calf Pain  negative for Bowel Movement   Tolerating Diet: yes         Patient reports pain as 3 on 0-10 scale.    Objective: Vital signs in last 24 hours:    Patient Vitals for the past 24 hrs:  BP Temp Temp src Pulse Resp SpO2  06/12/19 0755 (!) 125/55 97.8 F (36.6 C) Oral 75 18 93 %  06/12/19 0325 (!) 144/62 97.9 F (36.6 C) Oral 70 16 97 %  06/11/19 1930 (!) 117/54 98.6 F (37 C) Oral 75 15 96 %  06/11/19 1736 124/60 97.6 F (36.4 C) Oral 86 16 100 %  06/11/19 1412 (!) 109/51 98.4 F (36.9 C) Oral 81 16 98 %  06/11/19 1345 (!) 108/44 98.5 F (36.9 C) Oral 77 16 92 %  06/11/19 1123 (!) 107/43 98.5 F (36.9 C) Oral 86 16 99 %    @flow {1959:LAST@   Intake/Output from previous day:   09/26 0701 - 09/27 0700 In: 720 [P.O.:360] Out: 1550 [Urine:1550]   Intake/Output this shift:   No intake/output data recorded.   Intake/Output      09/26 0701 - 09/27 0700 09/27 0701 - 09/28 0700   P.O. 360    I.V. (mL/kg) 0 (0)    Blood 360    IV Piggyback 0    Total Intake(mL/kg) 720 (9.9)    Urine (mL/kg/hr) 1550 (0.9)    Total Output 1550    Net -830            LABORATORY DATA: Recent Labs    06/09/19 0318 06/09/19 0452 06/10/19 0420 06/11/19 0340 06/12/19 0714  WBC 10.3 10.5 7.2 7.4  --   HGB 10.7* 11.0* 7.5* 6.4* 8.8*  HCT 34.7* 35.7* 23.9* 20.3* 26.3*  PLT 160 163 115* 103*  --    Recent Labs    06/09/19 0318 06/09/19 0452 06/10/19 0420 06/11/19 0340 06/12/19 0336  NA 139 139 138 136 139  K 5.1 4.8 5.2* 4.3 4.8  CL 104 103 106 104 104  CO2 26 26 25 26 26   BUN 25* 25* 34* 48* 42*  CREATININE 1.36* 1.32* 1.63* 1.91* 1.52*  GLUCOSE 215* 214* 287*  124* 171*  CALCIUM 8.7* 8.8* 8.5* 8.1* 8.6*   Lab Results  Component Value Date   INR 1.1 06/09/2019   INR 1.16 02/01/2016   INR 1.06 01/30/2016    Examination:  General appearance: alert, cooperative and no distress Extremities: extremities normal, atraumatic, no cyanosis or edema  Wound Exam: clean, dry, intact   Drainage:  None: wound tissue dry  Motor Exam: Quadriceps and Hamstrings Intact  Sensory Exam: Superficial Peroneal, Deep Peroneal and Tibial normal   Assessment:    3 Days Post-Op  Procedure(s) (LRB): INTRAMEDULLARY (IM) NAIL INTERTROCHANTRIC (Left)  ADDITIONAL DIAGNOSIS:  Principal Problem:   Closed left hip fracture (HCC) Active Problems:   Type 2 diabetes mellitus (HCC)   Stroke (HCC)   PAF (paroxysmal atrial fibrillation) (HCC)   Vascular dementia (HCC)   Renal insufficiency  Plan: Physical Therapy as ordered Weight Bearing as Tolerated (WBAT)  Patient doing well, minimal pain. No wound issues. Will continue to follow until CIR placement. Medicine following  Donia Ast 06/12/2019, 8:48 AM

## 2019-06-12 NOTE — Plan of Care (Signed)

## 2019-06-13 ENCOUNTER — Other Ambulatory Visit: Payer: Self-pay

## 2019-06-13 LAB — CBC WITH DIFFERENTIAL/PLATELET
Abs Immature Granulocytes: 0.02 10*3/uL (ref 0.00–0.07)
Basophils Absolute: 0 10*3/uL (ref 0.0–0.1)
Basophils Relative: 0 %
Eosinophils Absolute: 0.2 10*3/uL (ref 0.0–0.5)
Eosinophils Relative: 3 %
HCT: 27.8 % — ABNORMAL LOW (ref 36.0–46.0)
Hemoglobin: 9.1 g/dL — ABNORMAL LOW (ref 12.0–15.0)
Immature Granulocytes: 0 %
Lymphocytes Relative: 32 %
Lymphs Abs: 1.9 10*3/uL (ref 0.7–4.0)
MCH: 29.3 pg (ref 26.0–34.0)
MCHC: 32.7 g/dL (ref 30.0–36.0)
MCV: 89.4 fL (ref 80.0–100.0)
Monocytes Absolute: 0.7 10*3/uL (ref 0.1–1.0)
Monocytes Relative: 12 %
Neutro Abs: 3 10*3/uL (ref 1.7–7.7)
Neutrophils Relative %: 53 %
Platelets: 147 10*3/uL — ABNORMAL LOW (ref 150–400)
RBC: 3.11 MIL/uL — ABNORMAL LOW (ref 3.87–5.11)
RDW: 15 % (ref 11.5–15.5)
WBC: 5.8 10*3/uL (ref 4.0–10.5)
nRBC: 0 % (ref 0.0–0.2)

## 2019-06-13 LAB — GLUCOSE, CAPILLARY
Glucose-Capillary: 125 mg/dL — ABNORMAL HIGH (ref 70–99)
Glucose-Capillary: 129 mg/dL — ABNORMAL HIGH (ref 70–99)
Glucose-Capillary: 142 mg/dL — ABNORMAL HIGH (ref 70–99)
Glucose-Capillary: 156 mg/dL — ABNORMAL HIGH (ref 70–99)
Glucose-Capillary: 164 mg/dL — ABNORMAL HIGH (ref 70–99)
Glucose-Capillary: 248 mg/dL — ABNORMAL HIGH (ref 70–99)

## 2019-06-13 LAB — BASIC METABOLIC PANEL
Anion gap: 7 (ref 5–15)
BUN: 34 mg/dL — ABNORMAL HIGH (ref 8–23)
CO2: 29 mmol/L (ref 22–32)
Calcium: 9 mg/dL (ref 8.9–10.3)
Chloride: 104 mmol/L (ref 98–111)
Creatinine, Ser: 1.35 mg/dL — ABNORMAL HIGH (ref 0.44–1.00)
GFR calc Af Amer: 41 mL/min — ABNORMAL LOW (ref >60)
GFR calc non Af Amer: 35 mL/min — ABNORMAL LOW (ref >60)
Glucose, Bld: 133 mg/dL — ABNORMAL HIGH (ref 70–99)
Potassium: 5.1 mmol/L (ref 3.5–5.1)
Sodium: 140 mmol/L (ref 135–145)

## 2019-06-13 LAB — MAGNESIUM: Magnesium: 1.8 mg/dL (ref 1.7–2.4)

## 2019-06-13 MED ORDER — APIXABAN 5 MG PO TABS
5.0000 mg | ORAL_TABLET | Freq: Two times a day (BID) | ORAL | Status: DC
  Administered 2019-06-13 – 2019-06-16 (×6): 5 mg via ORAL
  Filled 2019-06-13 (×6): qty 1

## 2019-06-13 NOTE — Progress Notes (Signed)
Pharmacy - > Eliquis  85 y.o female admitted on 06/09/19 for Mechanical fall>left hip fracture.  S/p surgery  surgery for hip repair on 06/09/2019.  She was on Eliquis pta for h/o Afib/CVA.   On 06/10/19 resumed pta Eliquis for AFib / CVA post hip surgery  Prior to admission she was taking Eliquis 5 mg po BID  Scr increased to 1.63 on 9/25 thus we reduced  Apixaban to 2.5mg  BID.  Today her SCr is back down to 1.35.  Admit SCr was 1.32.  I will increase Apixaban back to PTA dose of 5 mg BID   Plan: Increase Apixaban to 5 mg BID  Follow up AM BMET  Thank you Nicole Cella, RPh Clinical Pharmacist [phone] Please check AMION for all [place] phone numbers After 10:00 PM, call [place] [phone] 06/13/2019 11:19 AM

## 2019-06-13 NOTE — Consult Note (Signed)
   [facility] CM Inpatient Consult   06/13/2019  Laura Parks [birth year] [id number]    Patientreviewed for potential [facility] care management services needed as benefit fromher Medicare/NextGen plan, with 22% high risk score for unplanned readmission and hospitalization.   Chart review andMD brief narrative showas follows: 85 year old female with history of mild cognitive impairment, coronary disease, hypertension, diabetes mellitus type 2, unspecified CVA, chronic diastolic CHF, paroxysmal A. fib on Eliquis,   presented on 06/09/2019 with left hip pain after a fall.  She was found to have left hip fracture for which she underwent surgery on 06/09/2019 by orthopedics.  Primary care provider isDr. Jerene Bears with [facility].  Brief chartreviewrevealsPT/ OT & MD notes recommending patient for CIR([facility]).  Will followforprogress anddisposition, and ifthere are any changesin dispositionand needs for appropriate community follow-up,please referpatient to [facility] care management.  Of note, [facility] Care Management services does not replace or interfere with any servicesarranged by transition of care CM or social work.   Forquestions and additional information, pleasecontact:  Raiyah Speakman A. Paxton Kanaan, BSN, RN-BC [facility] Liaison Cell: [phone]

## 2019-06-13 NOTE — Progress Notes (Signed)
Orthopaedic Trauma Progress Note  S: Doing well, minimal pain at rest. Working well with therapies. No questions or concerns currently  O:  Vitals:   06/12/19 2044 06/13/19 0416  BP: (!) 148/60 (!) 137/58  Pulse: 71 74  Resp: 18 18  Temp: 98.2 F (36.8 C) (!) 97.4 F (36.3 C)  SpO2: 95% 94%    General - Laying in bed, NAD. Pleasant and cooperative  Left Lower Extremity - Incisions clean, dry, intact with steri-strips. Some tenderness with palpation of lateral thigh. Non-tender in knee or lower leg. Dorsiflexion/plantarflexion intact. Able to wiggle toes. Tolerates small amount of passive knee flexion. Sensation intact to light touch distally. 2+ DP pulse  Imaging: Stable post op imaging.   Labs:  Results for orders placed or performed during the hospital encounter of 06/09/19 (from the past 24 hour(s))  Glucose, capillary     Status: Abnormal   Collection Time: 06/12/19  7:52 AM  Result Value Ref Range   Glucose-Capillary 153 (H) 70 - 99 mg/dL  Glucose, capillary     Status: None   Collection Time: 06/12/19 11:48 AM  Result Value Ref Range   Glucose-Capillary 87 70 - 99 mg/dL  Glucose, capillary     Status: Abnormal   Collection Time: 06/12/19  4:36 PM  Result Value Ref Range   Glucose-Capillary 151 (H) 70 - 99 mg/dL  Glucose, capillary     Status: Abnormal   Collection Time: 06/12/19 11:25 PM  Result Value Ref Range   Glucose-Capillary 126 (H) 70 - 99 mg/dL  CBC with Differential/Platelet     Status: Abnormal   Collection Time: 06/13/19  3:40 AM  Result Value Ref Range   WBC 5.8 4.0 - 10.5 K/uL   RBC 3.11 (L) 3.87 - 5.11 MIL/uL   Hemoglobin 9.1 (L) 12.0 - 15.0 g/dL   HCT 27.8 (L) 36.0 - 46.0 %   MCV 89.4 80.0 - 100.0 fL   MCH 29.3 26.0 - 34.0 pg   MCHC 32.7 30.0 - 36.0 g/dL   RDW 15.0 11.5 - 15.5 %   Platelets 147 (L) 150 - 400 K/uL   nRBC 0.0 0.0 - 0.2 %   Neutrophils Relative % 53 %   Neutro Abs 3.0 1.7 - 7.7 K/uL   Lymphocytes Relative 32 %   Lymphs Abs 1.9  0.7 - 4.0 K/uL   Monocytes Relative 12 %   Monocytes Absolute 0.7 0.1 - 1.0 K/uL   Eosinophils Relative 3 %   Eosinophils Absolute 0.2 0.0 - 0.5 K/uL   Basophils Relative 0 %   Basophils Absolute 0.0 0.0 - 0.1 K/uL   Immature Granulocytes 0 %   Abs Immature Granulocytes 0.02 0.00 - 0.07 K/uL  Basic metabolic panel     Status: Abnormal   Collection Time: 06/13/19  3:40 AM  Result Value Ref Range   Sodium 140 135 - 145 mmol/L   Potassium 5.1 3.5 - 5.1 mmol/L   Chloride 104 98 - 111 mmol/L   CO2 29 22 - 32 mmol/L   Glucose, Bld 133 (H) 70 - 99 mg/dL   BUN 34 (H) 8 - 23 mg/dL   Creatinine, Ser 1.35 (H) 0.44 - 1.00 mg/dL   Calcium 9.0 8.9 - 10.3 mg/dL   GFR calc non Af Amer 35 (L) >60 mL/min   GFR calc Af Amer 41 (L) >60 mL/min   Anion gap 7 5 - 15  Magnesium     Status: None   Collection Time: 06/13/19  3:40 AM  Result Value Ref Range   Magnesium 1.8 1.7 - 2.4 mg/dL  Glucose, capillary     Status: Abnormal   Collection Time: 06/13/19  4:13 AM  Result Value Ref Range   Glucose-Capillary 125 (H) 70 - 99 mg/dL    Assessment: 85 year old female s/p fall  Injuries: Left intertrochanteric femur fracture s/p IMN 06/09/19  Weightbearing: WBAT LLE  Insicional and dressing care: leave open to air  Orthopedic device(s):none  CV/Blood loss: Acute blood loss anemia, Hgb 9.1 this morning. Hemodynamically stable  Pain management:  1. Tylenol 650 mg q 6 hours scheduled 2. Robaxin 500 mg q 6 hours PRN 3. Tramadol 50 mg q 6 hours PRN 4. Neurontin 100 mg TID 5. Morphine 1-2 mg q 3 hours PRN  VTE prophylaxis: Eliquis  ID:  Ancef 2gm post op completed  Foley/Lines:  No foley, KVO IVFs  Medical co-morbidities: coronary artery disease, hypertension, type 2 diabetes mellitus, history of CVA, chronic diastolic CHF, and paroxysmal atrial fibrillation on Eliquis  Dispo: PT/OT eval today, recommending CIR. Okay for discharge from ortho standpoint once cleared by medicine team and mobilizing  well with therapies  Follow - up plan: Will continue to follow while in hospital, plan for outpatient follow up 2 weeks after discharge for wound check, repeat x-rays    Laura Parks A. Carmie [facility] ?[phone] (phone)

## 2019-06-13 NOTE — Discharge Instructions (Signed)
Orthopaedic Trauma Service Discharge Instructions   General Discharge Instructions  WEIGHT BEARING STATUS: weightbearing as tolerated on left leg  RANGE OF MOTION/ACTIVITY: Unrestricted range of motion of left hip and knee  Wound Care: Incisions can be left open to air if there is no drainage. If incision continues to have drainage, follow wound care instructions below. Okay to shower if no drainage from incisions.  DVT/PE prophylaxis: Continue Eliquis  Diet: as you were eating previously.  Can use over the counter stool softeners and bowel preparations, such as Miralax, to help with bowel movements.  Narcotics can be constipating.  Be sure to drink plenty of fluids  PAIN MEDICATION USE AND EXPECTATIONS  You have likely been given narcotic medications to help control your pain.  After a traumatic event that results in an fracture (broken bone) with or without surgery, it is ok to use narcotic pain medications to help control one's pain.  We understand that everyone responds to pain differently and each individual patient will be evaluated on a regular basis for the continued need for narcotic medications. Ideally, narcotic medication use should last no more than 6-8 weeks (coinciding with fracture healing).   As a patient it is your responsibility as well to monitor narcotic medication use and report the amount and frequency you use these medications when you come to your office visit.   We would also advise that if you are using narcotic medications, you should take a dose prior to therapy to maximize you participation.  IF YOU ARE ON NARCOTIC MEDICATIONS IT IS NOT PERMISSIBLE TO OPERATE A MOTOR VEHICLE (MOTORCYCLE/CAR/TRUCK/MOPED) OR HEAVY MACHINERY DO NOT MIX NARCOTICS WITH OTHER CNS (CENTRAL NERVOUS SYSTEM) DEPRESSANTS SUCH AS ALCOHOL   STOP SMOKING OR USING NICOTINE PRODUCTS!!!!  As discussed nicotine severely impairs your body's ability to heal surgical and traumatic wounds but  also impairs bone healing.  Wounds and bone heal by forming microscopic blood vessels (angiogenesis) and nicotine is a vasoconstrictor (essentially, shrinks blood vessels).  Therefore, if vasoconstriction occurs to these microscopic blood vessels they essentially disappear and are unable to deliver necessary nutrients to the healing tissue.  This is one modifiable factor that you can do to dramatically increase your chances of healing your injury.    (This means no smoking, no nicotine gum, patches, etc)  DO NOT USE NONSTEROIDAL ANTI-INFLAMMATORY DRUGS (NSAID'S)  Using products such as Advil (ibuprofen), Aleve (naproxen), Motrin (ibuprofen) for additional pain control during fracture healing can delay and/or prevent the healing response.  If you would like to take over the counter (OTC) medication, Tylenol (acetaminophen) is ok.  However, some narcotic medications that are given for pain control contain acetaminophen as well. Therefore, you should not exceed more than 4000 mg of tylenol in a day if you do not have liver disease.  Also note that there are may OTC medicines, such as cold medicines and allergy medicines that my contain tylenol as well.  If you have any questions about medications and/or interactions please ask your doctor/PA or your pharmacist.      ICE AND ELEVATE INJURED/OPERATIVE EXTREMITY  Using ice and elevating the injured extremity above your heart can help with swelling and pain control.  Icing in a pulsatile fashion, such as 20 minutes on and 20 minutes off, can be followed.    Do not place ice directly on skin. Make sure there is a barrier between to skin and the ice pack.    Using frozen items such as frozen  peas works well as the conform nicely to the are that needs to be iced.  USE AN ACE WRAP OR TED HOSE FOR SWELLING CONTROL  In addition to icing and elevation, Ace wraps or TED hose are used to help limit and resolve swelling.  It is recommended to use Ace wraps or TED hose  until you are informed to stop.    When using Ace Wraps start the wrapping distally (farthest away from the body) and wrap proximally (closer to the body)   Example: If you had surgery on your leg or thing and you do not have a splint on, start the ace wrap at the toes and work your way up to the thigh        If you had surgery on your upper extremity and do not have a splint on, start the ace wrap at your fingers and work your way up to the upper arm   Nice: [phone]   VISIT OUR WEBSITE FOR ADDITIONAL INFORMATION: [email or web address]   Discharge Wound Care Instructions  Do NOT apply any ointments, solutions or lotions to pin sites or surgical wounds.  These prevent needed drainage and even though solutions like hydrogen peroxide kill bacteria, they also damage cells lining the pin sites that help fight infection.  Applying lotions or ointments can keep the wounds moist and can cause them to breakdown and open up as well. This can increase the risk for infection. When in doubt call the office.  Surgical incisions should be dressed daily.  If any drainage is noted, use one layer of adaptic, then gauze, Kerlix, and an ace wrap.  Once the incision is completely dry and without drainage, it may be left open to air out.  Showering may begin 36-48 hours later.  Cleaning gently with soap and water.  Traumatic wounds should be dressed daily as well.    One layer of adaptic, gauze, Kerlix, then ace wrap.  The adaptic can be discontinued once the draining has ceased    If you have a wet to dry dressing: wet the gauze with saline the squeeze as much saline out so the gauze is moist (not soaking wet), place moistened gauze over wound, then place a dry gauze over the moist one, followed by Kerlix wrap, then ace wrap.

## 2019-06-13 NOTE — Progress Notes (Signed)
Inpatient Rehabilitation-Admissions Coordinator   Noted great progress made with PT today and feel pt would benefit from intensive rehab program. However, after discussing the case with admitting PM&R MD, Dr. Dagoberto Ligas, pt does not have the medical necessity to qualify for an IP Rehab stay. Unfortunately, I cannot pursue admit to CIR. AC will sign off and communicate with SW/CM. MD aware.   Please call if questions.   Jhonnie Garner, OTR/L  Rehab Admissions Coordinator  [phone] 06/13/2019 11:56 AM

## 2019-06-13 NOTE — Progress Notes (Signed)
Patient ID: LAURISA SAHAKIAN, female   DOB: 1930-04-03, 85 y.o.   MRN: [id number]  PROGRESS NOTE    BRITA JURGENSEN  PJK:[id number] DOB: 12-21-1929 DOA: 06/09/2019 PCP: Glenda Chroman, MD   Brief Narrative:  Exam-year-old female with history of mild cognitive impairment, coronary disease, hypertension, diabetes mellitus type 2, unspecified CVA, chronic diastolic CHF, paroxysmal A. fib on Eliquis presented on 06/09/2019 with left hip pain after a fall.  She was found to have left hip fracture for which she underwent surgery on 06/09/2019 by orthopedics.  Assessment & Plan:   Left hip fracture after a mechanical fall -Status post surgical intervention on 06/09/2019 by orthopedics.  Wound care as per orthopedics recommendations. -PT/OT eval. PT recommending CIR placement. -Pain control.  Bowel regimen.  Acute blood loss anemia -Probably perioperative blood loss -Hemoglobin 6.4 on 06/11/2019.  Status post 1 unit packed red cell transfusion on 06/11/2019.  Hemoglobin 9.1 this morning.  Monitor  Paroxysmal A. Fib -Currently rate controlled.  Continue Eliquis.  Acute kidney injury on chronic disease stage III  -Improving.  Creatinine 1. 3 5 today.  Baseline creatinine ranging from 1.3-1.5  Diabetes mellitus type II -A1c 6.9.  Actos on hold.  Continue CBGs with SSI  Chronic diastolic CHF -EF preserved in June 2020 with grade 2 diastolic dysfunction -Compensated.  Lasix on hold.  Strict input output and daily weights.  History of CAD -Stable.  Continue statin  Probable dementia with mild cognitive impairment -Monitor mental status.  Fall precaution.  Outpatient follow-up   DVT prophylaxis:  Eliquis Code Status: Full Family Communication: None at bedside Disposition Plan: CIR once bed is available  Consultants: Orthopedics  Procedures:  Cephalomedullary nailing of left intertrochanteric femur fracture on 06/09/2019  Antimicrobials:  Peri-op antibiotic  Subjective: Patient seen and  examined at bedside.  Poor historian.  However no fever or vomiting.  No worsening lower extremity pain.   Objective: Vitals:   06/12/19 0935 06/12/19 2044 06/13/19 0416 06/13/19 0740  BP: (!) 130/57 (!) 148/60 (!) 137/58 132/63  Pulse: 68 71 74 78  Resp:  18 18 16   Temp:  98.2 F (36.8 C) (!) 97.4 F (36.3 C) 97.9 F (36.6 C)  TempSrc:  Oral Oral Oral  SpO2:  95% 94% 95%  Weight:      Height:        Intake/Output Summary (Last 24 hours) at 06/13/2019 1152 Last data filed at 06/13/2019 0900 Gross per 24 hour  Intake 360 ml  Output 1500 ml  Net -1140 ml   Filed Weights   06/09/19 0400  Weight: 72.6 kg    Examination:  General exam: No distress.  Slightly confused to time.  Elderly female lying in bed. Respiratory system: Bilateral decreased breath sounds at bases with scattered crackles.  No wheezing  cardiovascular system: Rate controlled, S1-S2 heard Gastrointestinal system: Abdomen is nondistended, soft and nontender. Normal bowel sounds heard. Extremities: No cyanosis; trace edema   Data Reviewed: I have personally reviewed following labs and imaging studies  CBC: Recent Labs  Lab 06/09/19 0318 06/09/19 0452 06/10/19 0420 06/11/19 0340 06/12/19 0714 06/13/19 0340  WBC 10.3 10.5 7.2 7.4  --  5.8  NEUTROABS 7.6  --   --   --   --  3.0  HGB 10.7* 11.0* 7.5* 6.4* 8.8* 9.1*  HCT 34.7* 35.7* 23.9* 20.3* 26.3* 27.8*  MCV 90.8 91.3 90.5 90.2  --  89.4  PLT 160 163 115* 103*  --  147*  Basic Metabolic Panel: Recent Labs  Lab 06/09/19 0452 06/10/19 0420 06/11/19 0340 06/12/19 0336 06/13/19 0340  NA 139 138 136 139 140  K 4.8 5.2* 4.3 4.8 5.1  CL 103 106 104 104 104  CO2 26 25 26 26 29   GLUCOSE 214* 287* 124* 171* 133*  BUN 25* 34* 48* 42* 34*  CREATININE 1.32* 1.63* 1.91* 1.52* 1.35*  CALCIUM 8.8* 8.5* 8.1* 8.6* 9.0  MG  --  1.7 1.7  --  1.8   GFR: Estimated Creatinine Clearance: 27.4 mL/min (A) (by C-G formula based on SCr of 1.35 mg/dL (H)). Liver  Function Tests: No results for input(s): AST, ALT, ALKPHOS, BILITOT, PROT, ALBUMIN in the last 168 hours. No results for input(s): LIPASE, AMYLASE in the last 168 hours. No results for input(s): AMMONIA in the last 168 hours. Coagulation Profile: Recent Labs  Lab 06/09/19 0318  INR 1.1   Cardiac Enzymes: No results for input(s): CKTOTAL, CKMB, CKMBINDEX, TROPONINI in the last 168 hours. BNP (last 3 results) No results for input(s): PROBNP in the last 8760 hours. HbA1C: No results for input(s): HGBA1C in the last 72 hours. CBG: Recent Labs  Lab 06/12/19 1148 06/12/19 1636 06/12/19 2325 06/13/19 0413 06/13/19 0742  GLUCAP 87 151* 126* 125* 129*   Lipid Profile: No results for input(s): CHOL, HDL, LDLCALC, TRIG, CHOLHDL, LDLDIRECT in the last 72 hours. Thyroid Function Tests: No results for input(s): TSH, T4TOTAL, FREET4, T3FREE, THYROIDAB in the last 72 hours. Anemia Panel: No results for input(s): VITAMINB12, FOLATE, FERRITIN, TIBC, IRON, RETICCTPCT in the last 72 hours. Sepsis Labs: No results for input(s): PROCALCITON, LATICACIDVEN in the last 168 hours.  Recent Results (from the past 240 hour(s))  SARS Coronavirus 2 [facility] order, Performed in [facility] hospital lab) Nasopharyngeal Nasopharyngeal Swab     Status: None   Collection Time: 06/09/19  3:24 AM   Specimen: Nasopharyngeal Swab  Result Value Ref Range Status   SARS Coronavirus 2 NEGATIVE NEGATIVE Final    Comment: (NOTE) If result is NEGATIVE SARS-CoV-2 target nucleic acids are NOT DETECTED. The SARS-CoV-2 RNA is generally detectable in upper and lower  respiratory specimens during the acute phase of infection. The lowest  concentration of SARS-CoV-2 viral copies this assay can detect is 250  copies / mL. A negative result does not preclude SARS-CoV-2 infection  and should not be used as the sole basis for treatment or other  patient management decisions.  A negative result may occur with  improper  specimen collection / handling, submission of specimen other  than nasopharyngeal swab, presence of viral mutation(s) within the  areas targeted by this assay, and inadequate number of viral copies  (<250 copies / mL). A negative result must be combined with clinical  observations, patient history, and epidemiological information. If result is POSITIVE SARS-CoV-2 target nucleic acids are DETECTED. The SARS-CoV-2 RNA is generally detectable in upper and lower  respiratory specimens dur ing the acute phase of infection.  Positive  results are indicative of active infection with SARS-CoV-2.  Clinical  correlation with patient history and other diagnostic information is  necessary to determine patient infection status.  Positive results do  not rule out bacterial infection or co-infection with other viruses. If result is PRESUMPTIVE POSTIVE SARS-CoV-2 nucleic acids MAY BE PRESENT.   A presumptive positive result was obtained on the submitted specimen  and confirmed on repeat testing.  While 2019 novel coronavirus  (SARS-CoV-2) nucleic acids may be present in the submitted sample  additional confirmatory testing may be necessary for epidemiological  and / or clinical management purposes  to differentiate between  SARS-CoV-2 and other Sarbecovirus currently known to infect humans.  If clinically indicated additional testing with an alternate test  methodology [phone]) is advised. The SARS-CoV-2 RNA is generally  detectable in upper and lower respiratory sp ecimens during the acute  phase of infection. The expected result is Negative. Fact Sheet for Patients:  StrictlyIdeas.no Fact Sheet for Healthcare Providers: BankingDealers.co.za This test is not yet approved or cleared by the [place] FDA and has been authorized for detection and/or diagnosis of SARS-CoV-2 by FDA under an Emergency Use Authorization (EUA).  This EUA will remain in  effect (meaning this test can be used) for the duration of the COVID-19 declaration under Section 564(b)(1) of the Act, 21 U.S.C. section 360bbb-3(b)(1), unless the authorization is terminated or revoked sooner. Performed at [facility], [place] [address] [place], [place] [address]          Radiology Studies: No results found.      Scheduled Meds: . sodium chloride   Intravenous Once  . acetaminophen  650 mg Oral Q6H  . amLODipine  5 mg Oral Daily  . apixaban  5 mg Oral BID  . atorvastatin  10 mg Oral q1800  . docusate sodium  100 mg Oral BID  . gabapentin  100 mg Oral TID  . influenza vaccine adjuvanted  0.5 mL Intramuscular Tomorrow-1000  . insulin aspart  0-9 Units Subcutaneous Q4H   Continuous Infusions: . lactated ringers Stopped (06/09/19 1554)  . methocarbamol (ROBAXIN) IV       LOS: 4 days        Aline August, MD [facility] 06/13/2019, 11:52 AM

## 2019-06-13 NOTE — Progress Notes (Signed)
Physical Therapy Treatment Patient Details Name: Laura Parks MRN: [id number] DOB: 30-Nov-1929 Today's Date: 06/13/2019    History of Present Illness Pt is an 85 y.o. female admitted 06/09/19 after fall sustaining L intertrochanteric femur fx, s/p IM nailing. PMH includes mild cognitive impairment, CAD, HTN, DM2, CVA, CHF, afib on eliquis, legal blindness.    PT Comments    Pt performed LE exercises in supine and transfer OOB to recliner.  She is moving with moderate +1 assistance.  Pt remains motivated and continues to benefit from aggressive therapy at CIR to improve strength and function before returning home.  Plan next session for progression of gt training with close chair follow.     Follow Up Recommendations  CIR;Supervision/Assistance - 24 hour     Equipment Recommendations  (TBD)    Recommendations for Other Services       Precautions / Restrictions Precautions Precautions: Fall Restrictions Weight Bearing Restrictions: Yes LLE Weight Bearing: Weight bearing as tolerated    Mobility  Bed Mobility Overal bed mobility: Needs Assistance Bed Mobility: Supine to Sit;Sit to Supine     Supine to sit: Mod assist     General bed mobility comments: Pt performed via helicopter transfer, Pt required assistance to advance LEs to edge of bed and to elevate trunk into a seated position.  Pt required HOB elevated and heavy use of railing.  Transfers Overall transfer level: Needs assistance Equipment used: Rolling walker (2 wheeled) Transfers: Sit to/from Stand Sit to Stand: Mod assist;From elevated surface Stand pivot transfers: Mod assist       General transfer comment: Pt performed sit to stand from elevated surface pushing down on RW to come into standing, pt slow and guarded, Required max VCs to reach back for seated surface and eccentrically load into recliner.  Ambulation/Gait Ambulation/Gait assistance: Mod assist Gait Distance (Feet): 4 Feet Assistive device:  Rolling walker (2 wheeled) Gait Pattern/deviations: Step-to pattern;Shuffle;Trunk flexed;Decreased stride length;Antalgic     General Gait Details: Pt slow and guarded with antalgic steps. Pt required cues for upper trunk control and sequencing.   Stairs             Wheelchair Mobility    Modified Rankin (Stroke Patients Only)       Balance Overall balance assessment: Needs assistance Sitting-balance support: Bilateral upper extremity supported Sitting balance-Leahy Scale: Fair       Standing balance-Leahy Scale: Poor Standing balance comment: B UE support and external assist                            Cognition Arousal/Alertness: Awake/alert Behavior During Therapy: Anxious;WFL for tasks assessed/performed Overall Cognitive Status: Difficult to assess                                        Exercises Total Joint Exercises Ankle Circles/Pumps: AROM;20 reps;Both;Supine Quad Sets: AROM;Left;10 reps;Supine Heel Slides: AAROM;Supine;Left;10 reps Hip ABduction/ADduction: AAROM;Left;10 reps;Supine    General Comments        Pertinent Vitals/Pain Pain Assessment: Faces Pain Location: L hip Pain Descriptors / Indicators: Grimacing;Guarding;Moaning Pain Intervention(s): Monitored during session;Repositioned    Home Living                      Prior Function            PT Goals (current goals can  now be found in the care plan section) Acute Rehab PT Goals Patient Stated Goal: to return home Potential to Achieve Goals: Good Progress towards PT goals: Progressing toward goals    Frequency    Min 5X/week      PT Plan Current plan remains appropriate    Co-evaluation              AM-PAC PT "6 Clicks" Mobility   Outcome Measure  Help needed turning from your back to your side while in a flat bed without using bedrails?: A Lot Help needed moving from lying on your back to sitting on the side of a flat bed  without using bedrails?: A Lot Help needed moving to and from a bed to a chair (including a wheelchair)?: A Lot Help needed standing up from a chair using your arms (e.g., wheelchair or bedside chair)?: A Lot Help needed to walk in hospital room?: A Lot Help needed climbing 3-5 steps with a railing? : A Lot 6 Click Score: 12    End of Session Equipment Utilized During Treatment: Gait belt Activity Tolerance: Patient tolerated treatment well Patient left: in chair;with call bell/phone within reach;with chair alarm set Nurse Communication: Mobility status PT Visit Diagnosis: Other abnormalities of gait and mobility (R26.89);Pain Pain - Right/Left: Left Pain - part of body: Leg     Time: 7353-2992 PT Time Calculation (min) (ACUTE ONLY): 27 min  Charges:  $Therapeutic Exercise: 8-22 mins $Therapeutic Activity: 8-22 mins                     Governor Rooks, PTA Acute Rehabilitation Services Pager [phone] Office [phone]     Chenae Brager Eli Hose 06/13/2019, 11:16 AM

## 2019-06-13 NOTE — Plan of Care (Signed)
  Problem: Education: Goal: Knowledge of General Education information will improve Description: Including pain rating scale, medication(s)/side effects and non-pharmacologic comfort measures Outcome: Progressing   Problem: Clinical Measurements: Goal: Will remain free from infection Outcome: Progressing   Problem: Activity: Goal: Risk for activity intolerance will decrease Outcome: Progressing   Problem: Nutrition: Goal: Adequate nutrition will be maintained Outcome: Progressing   Problem: Coping: Goal: Level of anxiety will decrease Outcome: Progressing   Problem: Elimination: Goal: Will not experience complications related to bowel motility Outcome: Progressing   Problem: Pain Managment: Goal: General experience of comfort will improve Outcome: Progressing   Problem: Safety: Goal: Ability to remain free from injury will improve Outcome: Progressing   Problem: Skin Integrity: Goal: Risk for impaired skin integrity will decrease Outcome: Progressing   Jenene Slicker, RN 06/13/19 1728

## 2019-06-14 LAB — BASIC METABOLIC PANEL
Anion gap: 9 (ref 5–15)
BUN: 35 mg/dL — ABNORMAL HIGH (ref 8–23)
CO2: 25 mmol/L (ref 22–32)
Calcium: 8.8 mg/dL — ABNORMAL LOW (ref 8.9–10.3)
Chloride: 103 mmol/L (ref 98–111)
Creatinine, Ser: 1.45 mg/dL — ABNORMAL HIGH (ref 0.44–1.00)
GFR calc Af Amer: 37 mL/min — ABNORMAL LOW (ref >60)
GFR calc non Af Amer: 32 mL/min — ABNORMAL LOW (ref >60)
Glucose, Bld: 139 mg/dL — ABNORMAL HIGH (ref 70–99)
Potassium: 5 mmol/L (ref 3.5–5.1)
Sodium: 137 mmol/L (ref 135–145)

## 2019-06-14 LAB — CBC WITH DIFFERENTIAL/PLATELET
Abs Immature Granulocytes: 0.02 10*3/uL (ref 0.00–0.07)
Basophils Absolute: 0 10*3/uL (ref 0.0–0.1)
Basophils Relative: 0 %
Eosinophils Absolute: 0.2 10*3/uL (ref 0.0–0.5)
Eosinophils Relative: 3 %
HCT: 28.5 % — ABNORMAL LOW (ref 36.0–46.0)
Hemoglobin: 8.9 g/dL — ABNORMAL LOW (ref 12.0–15.0)
Immature Granulocytes: 0 %
Lymphocytes Relative: 28 %
Lymphs Abs: 1.6 10*3/uL (ref 0.7–4.0)
MCH: 28.2 pg (ref 26.0–34.0)
MCHC: 31.2 g/dL (ref 30.0–36.0)
MCV: 90.2 fL (ref 80.0–100.0)
Monocytes Absolute: 0.7 10*3/uL (ref 0.1–1.0)
Monocytes Relative: 13 %
Neutro Abs: 3.2 10*3/uL (ref 1.7–7.7)
Neutrophils Relative %: 56 %
Platelets: 170 10*3/uL (ref 150–400)
RBC: 3.16 MIL/uL — ABNORMAL LOW (ref 3.87–5.11)
RDW: 15 % (ref 11.5–15.5)
WBC: 5.8 10*3/uL (ref 4.0–10.5)
nRBC: 0 % (ref 0.0–0.2)

## 2019-06-14 LAB — GLUCOSE, CAPILLARY
Glucose-Capillary: 143 mg/dL — ABNORMAL HIGH (ref 70–99)
Glucose-Capillary: 135 mg/dL — ABNORMAL HIGH (ref 70–99)
Glucose-Capillary: 132 mg/dL — ABNORMAL HIGH (ref 70–99)
Glucose-Capillary: 138 mg/dL — ABNORMAL HIGH (ref 70–99)
Glucose-Capillary: 193 mg/dL — ABNORMAL HIGH (ref 70–99)

## 2019-06-14 LAB — MAGNESIUM: Magnesium: 1.7 mg/dL (ref 1.7–2.4)

## 2019-06-14 NOTE — Progress Notes (Signed)
Physical Therapy Treatment Patient Details Name: Laura Parks MRN: [id number] DOB: 04/24/1930 Today's Date: 06/14/2019    History of Present Illness Pt is an 85 y.o. female admitted 06/09/19 after fall sustaining L intertrochanteric femur fx, s/p IM nailing. PMH includes mild cognitive impairment, CAD, HTN, DM2, CVA, CHF, afib on eliquis, legal blindness.    PT Comments    Pt progressing slowly. Continues to need mod A +2 for bed mobility and transfers. Ambulated 6' with RW and mod A +2 for physical support as well as very specific instructional cues for each step and movement of RW. PT will continue to follow.    Follow Up Recommendations  Supervision/Assistance - 24 hour;SNF     Equipment Recommendations  Rolling walker with 5" wheels    Recommendations for Other Services       Precautions / Restrictions Precautions Precautions: Fall Restrictions Weight Bearing Restrictions: Yes LLE Weight Bearing: Weight bearing as tolerated Other Position/Activity Restrictions: pt with visual limitations    Mobility  Bed Mobility Overal bed mobility: Needs Assistance Bed Mobility: Rolling;Sidelying to Sit Rolling: Min assist;+2 for physical assistance Sidelying to sit: Mod assist;+2 for physical assistance       General bed mobility comments: multimodal cues to initiate rolling and bend knees; assist to advance BLEs off bed and elevate trunk  Transfers Overall transfer level: Needs assistance Equipment used: Rolling walker (2 wheeled) Transfers: Sit to/from Stand Sit to Stand: Mod assist;From elevated surface;+2 physical assistance;+2 safety/equipment         General transfer comment: pt required increased assist to power up into standing; MAX multimodal cues for hand placement and to initate taking steps during functional mobility  Ambulation/Gait Ambulation/Gait assistance: Mod assist;+2 safety/equipment Gait Distance (Feet): 6 Feet Assistive device: Rolling walker (2  wheeled) Gait Pattern/deviations: Step-to pattern;Shuffle;Trunk flexed;Decreased stride length;Antalgic Gait velocity: decreased Gait velocity interpretation: <1.31 ft/sec, indicative of household ambulator General Gait Details: began assisting pt with L step but pt able to take small step independently after first 3. Needed vc's for each mvmt of RW and step. Fatigued quickly and c/o fatigue in UE's   Stairs             Wheelchair Mobility    Modified Rankin (Stroke Patients Only)       Balance Overall balance assessment: Needs assistance Sitting-balance support: Bilateral upper extremity supported Sitting balance-Leahy Scale: Fair Sitting balance - Comments: min A initially then supervision, worked on maintaining balance during scooting to EOB   Standing balance support: Bilateral upper extremity supported Standing balance-Leahy Scale: Poor Standing balance comment: B UE support and external assist                            Cognition Arousal/Alertness: Awake/alert Behavior During Therapy: WFL for tasks assessed/performed Overall Cognitive Status: Impaired/Different from baseline Area of Impairment: Following commands;Memory                     Memory: Decreased recall of precautions;Decreased short-term memory Following Commands: Follows one step commands with increased time;Follows multi-step commands with increased time       General Comments: pt with impaired memory and difficulty sequencing; distracted by pain with moving; requires increased time to process and initiate movement      Exercises Total Joint Exercises Ankle Circles/Pumps: AROM;20 reps;Both;Supine Quad Sets: AROM;10 reps;Supine;Both Heel Slides: AAROM;Supine;Left;10 reps General Exercises - Lower Extremity Long Arc Quad: AROM;Left;10 reps;Seated Straight Leg Raises: AAROM;Left;10  reps;Supine    General Comments        Pertinent Vitals/Pain Pain Assessment:  Faces Faces Pain Scale: Hurts even more Pain Location: L hip Pain Descriptors / Indicators: Grimacing;Guarding;Discomfort Pain Intervention(s): Monitored during session;Limited activity within patient's tolerance    Home Living                      Prior Function            PT Goals (current goals can now be found in the care plan section) Acute Rehab PT Goals Patient Stated Goal: to return home PT Goal Formulation: With patient Time For Goal Achievement: 06/24/19 Potential to Achieve Goals: Good Progress towards PT goals: Progressing toward goals    Frequency    Min 3X/week      PT Plan Discharge plan needs to be updated;Frequency needs to be updated    Co-evaluation PT/OT/SLP Co-Evaluation/Treatment: Yes Reason for Co-Treatment: For patient/therapist safety;Complexity of the patient's impairments (multi-system involvement) PT goals addressed during session: Mobility/safety with mobility;Balance;Proper use of DME;Strengthening/ROM OT goals addressed during session: ADL's and self-care      AM-PAC PT "6 Clicks" Mobility   Outcome Measure  Help needed turning from your back to your side while in a flat bed without using bedrails?: A Lot Help needed moving from lying on your back to sitting on the side of a flat bed without using bedrails?: A Lot Help needed moving to and from a bed to a chair (including a wheelchair)?: A Lot Help needed standing up from a chair using your arms (e.g., wheelchair or bedside chair)?: A Lot Help needed to walk in hospital room?: A Lot Help needed climbing 3-5 steps with a railing? : A Lot 6 Click Score: 12    End of Session Equipment Utilized During Treatment: Gait belt Activity Tolerance: Patient tolerated treatment well Patient left: in chair;with call bell/phone within reach;with chair alarm set Nurse Communication: Mobility status PT Visit Diagnosis: Other abnormalities of gait and mobility (R26.89);Pain Pain -  Right/Left: Left Pain - part of body: Leg     Time: 1749-4496 PT Time Calculation (min) (ACUTE ONLY): 28 min  Charges:  $Gait Training: 8-22 mins                     Leighton Roach, [place]  Pager [phone] Office [place] 06/14/2019, 1:15 PM

## 2019-06-14 NOTE — Progress Notes (Signed)
Patient ID: Laura Parks, female   DOB: 04-04-1930, 85 y.o.   MRN: [id number]  PROGRESS NOTE    Laura Parks  DGU:[id number] DOB: 1930-04-22 DOA: 06/09/2019 PCP: Glenda Chroman, MD   Brief Narrative:  Exam-year-old female with history of mild cognitive impairment, coronary disease, hypertension, diabetes mellitus type 2, unspecified CVA, chronic diastolic CHF, paroxysmal A. fib on Eliquis presented on 06/09/2019 with left hip pain after a fall.  She was found to have left hip fracture for which she underwent surgery on 06/09/2019 by orthopedics.  Assessment & Plan:   Left hip fracture after a mechanical fall -Status post surgical intervention on 06/09/2019 by orthopedics.  Wound care as per orthopedics recommendations. -PT/OT eval. PT recommending CIR placement.  Patient was denied with CIR for placement.  Social worker consulted for nursing home placement. -Pain control.  Bowel regimen.  Acute blood loss anemia -Probably perioperative blood loss -Hemoglobin 6.4 on 06/11/2019.  Status post 1 unit packed red cell transfusion on 06/11/2019.  Hemoglobin 8.9 this morning.  Monitor  Paroxysmal A. Fib -Currently rate controlled.  Continue Eliquis.  Acute kidney injury on chronic disease stage III  -Improving.  Creatinine 1.45 today.  Baseline creatinine ranging from 1.3-1.5  Diabetes mellitus type II -A1c 6.9.  Actos on hold.  Continue CBGs with SSI  Chronic diastolic CHF -EF preserved in June 2020 with grade 2 diastolic dysfunction -Compensated.  Lasix on hold.  Strict input output and daily weights.  History of CAD -Stable.  Continue statin  Probable dementia with mild cognitive impairment -Monitor mental status.  Fall precaution.  Outpatient follow-up   DVT prophylaxis:  Eliquis Code Status: Full Family Communication: Spoke to husband Patrycja Mumpower on 06/14/2019 Disposition Plan: SNF once bed is available  Consultants: Orthopedics  Procedures:  Cephalomedullary nailing of left  intertrochanteric femur fracture on 06/09/2019  Antimicrobials:  Peri-op antibiotic  Subjective: Patient seen and examined at bedside.  Poor historian.  Denies any overnight fever, nausea or vomiting.   Objective: Vitals:   06/13/19 0740 06/13/19 1512 06/13/19 1928 06/14/19 0346  BP: 132/63 (!) 137/59 133/60 (!) 145/70  Pulse: 78 74 79 97  Resp: 16 16 16 18   Temp: 97.9 F (36.6 C) 98.2 F (36.8 C) 98.6 F (37 C) 98.6 F (37 C)  TempSrc: Oral Oral Oral Oral  SpO2: 95% 96% 97% 99%  Weight:      Height:        Intake/Output Summary (Last 24 hours) at 06/14/2019 0739 Last data filed at 06/14/2019 0300 Gross per 24 hour  Intake 720 ml  Output 2500 ml  Net -1780 ml   Filed Weights   06/09/19 0400  Weight: 72.6 kg    Examination:  General exam: No acute distress.  Slightly confused to time.  Elderly female lying in bed. Respiratory system: Bilateral decreased breath sounds at bases with some crackles.   Cardiovascular system: S1-S2 heard, rate controlled Gastrointestinal system: Abdomen is nondistended, soft and nontender. Normal bowel sounds heard. Extremities: No cyanosis; trace edema   Data Reviewed: I have personally reviewed following labs and imaging studies  CBC: Recent Labs  Lab 06/09/19 0318 06/09/19 0452 06/10/19 0420 06/11/19 0340 06/12/19 0714 06/13/19 0340 06/14/19 0523  WBC 10.3 10.5 7.2 7.4  --  5.8 5.8  NEUTROABS 7.6  --   --   --   --  3.0 3.2  HGB 10.7* 11.0* 7.5* 6.4* 8.8* 9.1* 8.9*  HCT 34.7* 35.7* 23.9* 20.3* 26.3* 27.8* 28.5*  MCV 90.8 91.3 90.5 90.2  --  89.4 90.2  PLT 160 163 115* 103*  --  147* 341   Basic Metabolic Panel: Recent Labs  Lab 06/10/19 0420 06/11/19 0340 06/12/19 0336 06/13/19 0340 06/14/19 0523  NA 138 136 139 140 137  K 5.2* 4.3 4.8 5.1 5.0  CL 106 104 104 104 103  CO2 25 26 26 29 25   GLUCOSE 287* 124* 171* 133* 139*  BUN 34* 48* 42* 34* 35*  CREATININE 1.63* 1.91* 1.52* 1.35* 1.45*  CALCIUM 8.5* 8.1* 8.6*  9.0 8.8*  MG 1.7 1.7  --  1.8 1.7   GFR: Estimated Creatinine Clearance: 25.5 mL/min (A) (by C-G formula based on SCr of 1.45 mg/dL (H)). Liver Function Tests: No results for input(s): AST, ALT, ALKPHOS, BILITOT, PROT, ALBUMIN in the last 168 hours. No results for input(s): LIPASE, AMYLASE in the last 168 hours. No results for input(s): AMMONIA in the last 168 hours. Coagulation Profile: Recent Labs  Lab 06/09/19 0318  INR 1.1   Cardiac Enzymes: No results for input(s): CKTOTAL, CKMB, CKMBINDEX, TROPONINI in the last 168 hours. BNP (last 3 results) No results for input(s): PROBNP in the last 8760 hours. HbA1C: No results for input(s): HGBA1C in the last 72 hours. CBG: Recent Labs  Lab 06/13/19 1210 06/13/19 1643 06/13/19 1930 06/13/19 2349 06/14/19 0350  GLUCAP 156* 164* 248* 142* 143*   Lipid Profile: No results for input(s): CHOL, HDL, LDLCALC, TRIG, CHOLHDL, LDLDIRECT in the last 72 hours. Thyroid Function Tests: No results for input(s): TSH, T4TOTAL, FREET4, T3FREE, THYROIDAB in the last 72 hours. Anemia Panel: No results for input(s): VITAMINB12, FOLATE, FERRITIN, TIBC, IRON, RETICCTPCT in the last 72 hours. Sepsis Labs: No results for input(s): PROCALCITON, LATICACIDVEN in the last 168 hours.  Recent Results (from the past 240 hour(s))  SARS Coronavirus 2 [facility] order, Performed in [facility] hospital lab) Nasopharyngeal Nasopharyngeal Swab     Status: None   Collection Time: 06/09/19  3:24 AM   Specimen: Nasopharyngeal Swab  Result Value Ref Range Status   SARS Coronavirus 2 NEGATIVE NEGATIVE Final    Comment: (NOTE) If result is NEGATIVE SARS-CoV-2 target nucleic acids are NOT DETECTED. The SARS-CoV-2 RNA is generally detectable in upper and lower  respiratory specimens during the acute phase of infection. The lowest  concentration of SARS-CoV-2 viral copies this assay can detect is 250  copies / mL. A negative result does not preclude SARS-CoV-2  infection  and should not be used as the sole basis for treatment or other  patient management decisions.  A negative result may occur with  improper specimen collection / handling, submission of specimen other  than nasopharyngeal swab, presence of viral mutation(s) within the  areas targeted by this assay, and inadequate number of viral copies  (<250 copies / mL). A negative result must be combined with clinical  observations, patient history, and epidemiological information. If result is POSITIVE SARS-CoV-2 target nucleic acids are DETECTED. The SARS-CoV-2 RNA is generally detectable in upper and lower  respiratory specimens dur ing the acute phase of infection.  Positive  results are indicative of active infection with SARS-CoV-2.  Clinical  correlation with patient history and other diagnostic information is  necessary to determine patient infection status.  Positive results do  not rule out bacterial infection or co-infection with other viruses. If result is PRESUMPTIVE POSTIVE SARS-CoV-2 nucleic acids MAY BE PRESENT.   A presumptive positive result was obtained on the submitted specimen  and  confirmed on repeat testing.  While 2019 novel coronavirus  (SARS-CoV-2) nucleic acids may be present in the submitted sample  additional confirmatory testing may be necessary for epidemiological  and / or clinical management purposes  to differentiate between  SARS-CoV-2 and other Sarbecovirus currently known to infect humans.  If clinically indicated additional testing with an alternate test  methodology [phone]) is advised. The SARS-CoV-2 RNA is generally  detectable in upper and lower respiratory sp ecimens during the acute  phase of infection. The expected result is Negative. Fact Sheet for Patients:  StrictlyIdeas.no Fact Sheet for Healthcare Providers: BankingDealers.co.za This test is not yet approved or cleared by the [place]  FDA and has been authorized for detection and/or diagnosis of SARS-CoV-2 by FDA under an Emergency Use Authorization (EUA).  This EUA will remain in effect (meaning this test can be used) for the duration of the COVID-19 declaration under Section 564(b)(1) of the Act, 21 U.S.C. section 360bbb-3(b)(1), unless the authorization is terminated or revoked sooner. Performed at [facility], [place] [address], [place], [place] [address]          Radiology Studies: No results found.      Scheduled Meds: . sodium chloride   Intravenous Once  . acetaminophen  650 mg Oral Q6H  . amLODipine  5 mg Oral Daily  . apixaban  5 mg Oral BID  . atorvastatin  10 mg Oral q1800  . docusate sodium  100 mg Oral BID  . gabapentin  100 mg Oral TID  . insulin aspart  0-9 Units Subcutaneous Q4H   Continuous Infusions: . lactated ringers Stopped (06/09/19 1554)  . methocarbamol (ROBAXIN) IV       LOS: 5 days        Aline August, MD [facility] 06/14/2019, 7:39 AM

## 2019-06-14 NOTE — Progress Notes (Signed)
RN received call from Pt's daughter Jhonnie Garner, gave updates to her. Pt requesting for a call from MD and CSW regarding discharge.

## 2019-06-14 NOTE — Care Management Important Message (Signed)
Important Message  Patient Details  Name: Laura Parks MRN: [id number] Date of Birth: [birth year]   Medicare Important Message Given:  Yes     [name] 06/14/2019, 2:30 PM

## 2019-06-14 NOTE — Progress Notes (Signed)
Occupational Therapy Treatment Patient Details Name: Laura Parks MRN: [id number] DOB: November 06, 1929 Today's Date: 06/14/2019    History of present illness Pt is an 85 y.o. female admitted 06/09/19 after fall sustaining L intertrochanteric femur fx, s/p IM nailing. PMH includes mild cognitive impairment, CAD, HTN, DM2, CVA, CHF, afib on eliquis, legal blindness.   OT comments   Session focus on seated ADLs and functional mobility as precursor to higher level ADLs. Pt MOD A +2 for bed mobility; pt requires MAX multimodal cues for sequencing of task during bed mobility and functional mobility. Pt required increased time to initiate ADL tasks and MIN A for applying toothpaste to toothbrush d/t visual impairment. Pt total A for LB dressing- pt unable to reach feet while sitting EOB. Pt unable to DC to SNF d/t pt lacking the medical complexity required for CIR admission. Updated DC plan to SNF as pt would benefit from SNF level therapies to maximize functional independence to return to PLOF. Will let OTR know about change in POC. Will continue to follow acutely for OT needs.    Follow Up Recommendations  SNF    Equipment Recommendations  Other (comment)(tbd to next venue)    Recommendations for Other Services      Precautions / Restrictions Precautions Precautions: Fall Restrictions Weight Bearing Restrictions: Yes LLE Weight Bearing: Weight bearing as tolerated       Mobility Bed Mobility Overal bed mobility: Needs Assistance Bed Mobility: Rolling;Sidelying to Sit Rolling: Min assist;+2 for physical assistance Sidelying to sit: Mod assist;+2 for physical assistance       General bed mobility comments: multimodal cues to initiate rolling and bend knees; assist to advance BLEs off bed and elevate trunk  Transfers Overall transfer level: Needs assistance Equipment used: Rolling walker (2 wheeled) Transfers: Sit to/from Stand Sit to Stand: Mod assist;From elevated surface;+2 physical  assistance;+2 safety/equipment         General transfer comment: pt required increased assit to power up into standting; MAX mutlimodal cues for hand placement and to initate taking steps during functional mobility    Balance Overall balance assessment: Needs assistance Sitting-balance support: Bilateral upper extremity supported Sitting balance-Leahy Scale: Fair     Standing balance support: Bilateral upper extremity supported Standing balance-Leahy Scale: Poor Standing balance comment: B UE support and external assist                           ADL either performed or assessed with clinical judgement   ADL Overall ADL's : Needs assistance/impaired     Grooming: Wash/dry face;Oral care;Sitting;Set up;Cueing for sequencing;Minimal assistance Grooming Details (indicate cue type and reason): pt slow to process during seated ADLs in recliner; cues for sequencing and to initaite task; MIN A for oral care d/t poor depth perception when applying toothpaste on toothbursh             Lower Body Dressing: Total assistance;Sit to/from stand Lower Body Dressing Details (indicate cue type and reason): total A to pull up socks from EOB; unablel to reach feet d/t pain Toilet Transfer: +2 for physical assistance;RW;Ambulation;Moderate assistance Toilet Transfer Details (indicate cue type and reason): simulated toilet transfer to recliner; MOD A +2 and multimodal cues  for sequencing steps during functional mobility         Functional mobility during ADLs: Moderate assistance;+2 for physical assistance;Rolling walker General ADL Comments: requires multimodal cues for safety during seated ADLs d/t visual impairments  Vision Baseline Vision/History: Retinopathy Patient Visual Report: No change from baseline     Perception     Praxis      Cognition Arousal/Alertness: Awake/alert Behavior During Therapy: WFL for tasks assessed/performed Overall Cognitive Status:  Impaired/Different from baseline Area of Impairment: Following commands;Memory                     Memory: Decreased recall of precautions;Decreased short-term memory Following Commands: Follows one step commands with increased time;Follows multi-step commands with increased time       General Comments: pt with impaired memory and difficulty sequencing; distracted by pain with moving; requires increased time to process and initiate movement        Exercises     Shoulder Instructions       General Comments      Pertinent Vitals/ Pain       Pain Assessment: Faces Faces Pain Scale: Hurts little more Pain Location: L hip Pain Descriptors / Indicators: Grimacing;Guarding;Discomfort Pain Intervention(s): Monitored during session;Limited activity within patient's tolerance;Repositioned  Home Living                                          Prior Functioning/Environment              Frequency  Min 2X/week        Progress Toward Goals  OT Goals(current goals can now be found in the care plan section)  Progress towards OT goals: Progressing toward goals  Acute Rehab OT Goals Patient Stated Goal: to return home OT Goal Formulation: With patient Time For Goal Achievement: 06/24/19 Potential to Achieve Goals: Good  Plan Discharge plan needs to be updated    Co-evaluation    PT/OT/SLP Co-Evaluation/Treatment: Yes Reason for Co-Treatment: For patient/therapist safety;To address functional/ADL transfers   OT goals addressed during session: ADL's and self-care      AM-PAC OT "6 Clicks" Daily Activity     Outcome Measure   Help from another person eating meals?: A Little Help from another person taking care of personal grooming?: A Little Help from another person toileting, which includes using toliet, bedpan, or urinal?: A Lot Help from another person bathing (including washing, rinsing, drying)?: A Lot Help from another person to put  on and taking off regular upper body clothing?: A Lot Help from another person to put on and taking off regular lower body clothing?: Total 6 Click Score: 13    End of Session Equipment Utilized During Treatment: Gait belt;Rolling walker  OT Visit Diagnosis: Unsteadiness on feet (R26.81);Muscle weakness (generalized) (M62.81);History of falling (Z91.81);Pain;Other symptoms and signs involving cognitive function   Activity Tolerance Patient tolerated treatment well   Patient Left in chair;with call bell/phone within reach   Nurse Communication Mobility status        Time: 2263-3354 OT Time Calculation (min): 29 min  Charges: OT General Charges $OT Visit: 1 Visit OT Treatments $Self Care/Home Management : 8-22 mins  Mitchell, [place] [phone] [place] 06/14/2019, 12:16 PM

## 2019-06-14 NOTE — TOC Initial Note (Signed)
Transition of Care [facility]) - Initial/Assessment Note    Patient Details  Name: Laura Parks MRN: [id number] Date of Birth: 05-22-1930  Transition of Care [facility]) CM/SW Contact:    Sable Feil, LCSW Phone Number: 06/14/2019, 12:50 PM  Clinical Narrative: Talked with patient at the bedside regarding her discharge plan. Mrs. Tapscott was sitting up in a chair and was open to talking with CSW regarding her discharge plan. Patient informed CSW that she is hard-of-hearing and her hering aids and eye glasses were at home. When asked, patient responded that she has never been to rehab before in a nursing facility. CSW explained recommendation of rehab, [facility] rehab's inability to accept her and going to short-term rehab in a skilled nursing facility. Mrs. Nunnelley gave permission for her husband to be contacted and the recommendation of ST rehab explained to him. Mr. Jeffrey expressed interest in  [facility] facility for his wife. The Medicare.gov SNF list for [facility] given and the ratings for each facility reviewed and explained to Mrs. Sterry. Patient also advised that she is medically ready for discharge and hopefully can go the a facility on 9/30.  Mrs. Nolden confirmed that her son Kasandra Knudsen lives with she and her husband, and her daughter [name] lives in [place]. Patient reported that she has 2 grandchildren and 2 great-grandchildren.                Expected Discharge Plan: Skilled Nursing Facility Barriers to Discharge: SNF Pending bed offer   Patient Goals and CMS Choice Patient states their goals for this hospitalization and ongoing recovery are:: Patient agreeable to ST rehab and expressed understanding of her need for rehab before returning home CMS Medicare.gov Compare Post Acute Care list provided to:: Patient Choice offered to / list presented to : Patient  Expected Discharge Plan and Services Expected Discharge Plan: [place] In-house  Referral: Clinical Social Work   Post Acute Care Choice: [place] Living arrangements for the past 2 months: [place]                                     Prior Living Arrangements/Services Living arrangements for the past 2 months: [place] with:: Adult Children, Spouse(Husband Marchia Bond and son Kasandra Knudsen) Patient language and need for interpreter reviewed:: No Do you feel safe going back to the place where you live?: No(Patient agreeable with ST rehab)      Need for Family Participation in Patient Care: Yes (Comment) Care giver support system in place?: No (comment)   Criminal Activity/Legal Involvement Pertinent to Current Situation/Hospitalization: No - Comment as needed  Activities of Daily Living Home Assistive Devices/Equipment: Other (Comment) ADL Screening (condition at time of admission) Patient's cognitive ability adequate to safely complete daily activities?: Yes Is the patient deaf or have difficulty hearing?: No Does the patient have difficulty seeing, even when wearing glasses/contacts?: Yes Does the patient have difficulty concentrating, remembering, or making decisions?: Yes Patient able to express need for assistance with ADLs?: Yes Does the patient have difficulty dressing or bathing?: Yes Independently performs ADLs?: No Does the patient have difficulty walking or climbing stairs?: Yes Weakness of Legs: Both Weakness of Arms/Hands: Both  Permission Sought/Granted Permission sought to share information with : Family Supports(Patient gave permission for her husband to be contacted and talked with spouse called while in room with patient.)  Share Information with NAME: Valbona Slabach     Permission granted to share info w Relationship: Husband  Permission granted to share info w Contact Information: [phone]  Emotional Assessment Appearance:: Appears stated age Attitude/Demeanor/Rapport: Engaged Affect  (typically observed): Pleasant, Appropriate Orientation: : Oriented to Self, Oriented to Place, Oriented to Situation Alcohol / Substance Use: Never Used Psych Involvement: No (comment)  Admission diagnosis:  Chronic anticoagulation [Z79.01] Renal insufficiency [N28.9] Normochromic normocytic anemia [D64.9] Closed intertrochanteric fracture of left hip, initial encounter (HCC) [S72.142A] Closed comminuted intertrochanteric fracture of left femur (HCC) [S72.142A] Elevated blood pressure reading with diagnosis of hypertension [I10] Patient Active Problem List   Diagnosis Date Noted  . Closed left hip fracture ([place]) 06/09/2019  . Renal insufficiency 06/09/2019  . Blood in the stool 06/18/2016  . Family hx of colon cancer 06/18/2016  . Absolute anemia 06/18/2016  . Embolic stroke ([place]) 48/25/0037  . Cerebrovascular accident (CVA) ([place])   . Cerebral infarction (hemorrhagic or thromboembolic)   . Acute delirium 12/31/2015  . Vascular dementia ([place]) 12/31/2015  . PAF (paroxysmal atrial fibrillation) ([place])   . Palliative care encounter   . Acute renal failure ([place])   . Altered mental status   . Stroke ([place])   . NSTEMI (non-ST elevated myocardial infarction) ([place])   . Hyperkalemia 12/27/2015  . CHF (congestive heart failure) ([place]) 12/27/2015  . Acute encephalopathy 12/27/2015  . TIA (transient ischemic attack) 12/27/2015  . AKI (acute kidney injury) ([place]) 12/27/2015  . Dehydration 12/27/2015  . Legal blindness 12/27/2015  . B12 deficiency 09/02/2014  . Gait abnormality 07/26/2014  . Delusions ([place]) 07/26/2014  . Depression 07/26/2014  . Cognitive impairment   . Type 2 diabetes mellitus ([place]) 02/02/2014  . Carotid artery occlusion 02/02/2014  . PVC's (premature ventricular contractions) 02/02/2014  . Mixed hyperlipidemia 02/23/2012  . Essential hypertension, benign 02/23/2012   PCP:  Glenda Chroman, MD Pharmacy:   [place], [place] [address] [place] [address] Phone: [phone] Fax: [phone]  [facility] [place], [place] [address] Phone: [phone] Fax: [phone]  [facility] [place], [place] [address] [place] [address] Phone: [phone] Fax: [phone]   Social Determinants of Health (SDOH) Interventions  No SDOH interventions needed or requested at this time.  Readmission Risk Interventions No flowsheet data found.

## 2019-06-14 NOTE — Plan of Care (Signed)

## 2019-06-14 NOTE — NC FL2 (Signed)
[place] LEVEL OF CARE SCREENING TOOL     IDENTIFICATION  Patient Name: Laura Parks Birthdate: 10/21/1929 Sex: female Admission Date (Current Location): 06/09/2019  [facility] and [place] Number:  [employer] and Address:  [facility], [place] [address], [place], [place] [address]      Provider Number: [id number]  Attending Physician Name and Address:  Aline August, MD  Relative Name and Phone Number:  Chakira Jachim - spouse; [phone]    Current Level of Care: Hospital Recommended Level of Care: [place] Prior Approval Number:    Date Approved/Denied:   PASRR Number: [id number] A(Eff. 06/25/05)  Discharge Plan: SNF    Current Diagnoses: Patient Active Problem List   Diagnosis Date Noted  . Closed left hip fracture ([place]) 06/09/2019  . Renal insufficiency 06/09/2019  . Blood in the stool 06/18/2016  . Family hx of colon cancer 06/18/2016  . Absolute anemia 06/18/2016  . Embolic stroke ([place]) 25/36/6440  . Cerebrovascular accident (CVA) ([place])   . Cerebral infarction (hemorrhagic or thromboembolic)   . Acute delirium 12/31/2015  . Vascular dementia ([place]) 12/31/2015  . PAF (paroxysmal atrial fibrillation) ([place])   . Palliative care encounter   . Acute renal failure ([place])   . Altered mental status   . Stroke ([place])   . NSTEMI (non-ST elevated myocardial infarction) ([place])   . Hyperkalemia 12/27/2015  . CHF (congestive heart failure) ([place]) 12/27/2015  . Acute encephalopathy 12/27/2015  . TIA (transient ischemic attack) 12/27/2015  . AKI (acute kidney injury) ([place]) 12/27/2015  . Dehydration 12/27/2015  . Legal blindness 12/27/2015  . B12 deficiency 09/02/2014  . Gait abnormality 07/26/2014  . Delusions ([place]) 07/26/2014  . Depression 07/26/2014  . Cognitive impairment   . Type 2 diabetes mellitus ([place]) 02/02/2014  . Carotid artery occlusion 02/02/2014  . PVC's (premature ventricular contractions)  02/02/2014  . Mixed hyperlipidemia 02/23/2012  . Essential hypertension, benign 02/23/2012    Orientation RESPIRATION BLADDER Height & Weight     Self, Situation, Place  Normal Incontinent, External catheter(Cath placed 06/10/19) Weight: 160 lb (72.6 kg) Height:  5\' 2"  (157.5 cm)  BEHAVIORAL SYMPTOMS/MOOD NEUROLOGICAL BOWEL NUTRITION STATUS      Continent Diet(Carb modified)  AMBULATORY STATUS COMMUNICATION OF NEEDS Skin     Verbally Skin abrasions, Other (Comment)(Incision left with hip with dressing in place; Skin tear left elbow with foam dressing)                       Personal Care Assistance Level of Assistance  Bathing, Feeding, Dressing Bathing Assistance: Maximum assistance(Upper body - mod assist; Lower body - total assist) Feeding assistance: Limited assistance(Min assist due to vision) Dressing Assistance: Maximum assistance(Upper body mod assist; Lower body plus 2 assist)     Functional Limitations Info  Sight, Hearing, Speech Sight Info: Impaired(Legally blind and wears glasses.  Did not have with her in hospital) Hearing Info: Impaired(Wears hearing aids - did not have with her at hospital) Speech Info: Adequate    [place]  PT (By licensed PT), OT (By licensed OT)     PT Frequency: 9/25 evaluation in hospital. PT at [facility] Eval and Treat a minimum of 5 days per week OT Frequency: 9/25 evaluation in hospital. OT at [facility] Eval and Treat a minimum of 5 days per week            Contractures Contractures Info: Not present    Additional  Factors Info  Code Status, Allergies, Insulin Sliding Scale Code Status Info: Full Allergies Info: Codeine   Insulin Sliding Scale Info: 0-9 Units every 4 hours       Current Medications (06/14/2019):  This is the current hospital active medication list Current Facility-Administered Medications  Medication Dose Route Frequency Provider Last Rate Last Dose  . 0.9 %  sodium chloride infusion (Manually  program via Guardrails IV Fluids)   Intravenous Once Lovey Newcomer T, NP      . acetaminophen (TYLENOL) tablet 650 mg  650 mg Oral Q6H Patrecia Pace A, PA-C   650 mg at 06/14/19 0552  . amLODipine (NORVASC) tablet 5 mg  5 mg Oral Daily Patrecia Pace A, PA-C   5 mg at 06/14/19 0901  . apixaban (ELIQUIS) tablet 5 mg  5 mg Oral BID Aline August, MD   5 mg at 06/14/19 0901  . atorvastatin (LIPITOR) tablet 10 mg  10 mg Oral q1800 Patrecia Pace A, PA-C   10 mg at 06/13/19 1751  . diphenhydrAMINE (BENADRYL) 12.5 MG/5ML elixir 12.5-25 mg  12.5-25 mg Oral Q4H PRN Delray Alt, PA-C      . docusate sodium (COLACE) capsule 100 mg  100 mg Oral BID Patrecia Pace A, PA-C   100 mg at 06/14/19 0901  . gabapentin (NEURONTIN) capsule 100 mg  100 mg Oral TID Patrecia Pace A, PA-C   100 mg at 06/14/19 0901  . insulin aspart (novoLOG) injection 0-9 Units  0-9 Units Subcutaneous Q4H Delray Alt, PA-C   1 Units at 06/14/19 9833  . lactated ringers infusion   Intravenous Continuous Delray Alt, PA-C   Stopped at 06/09/19 1554  . methocarbamol (ROBAXIN) tablet 500 mg  500 mg Oral Q6H PRN Patrecia Pace A, PA-C       Or  . methocarbamol (ROBAXIN) 500 mg in dextrose 5 % 50 mL IVPB  500 mg Intravenous Q6H PRN Delray Alt, PA-C      . metoCLOPramide (REGLAN) tablet 5-10 mg  5-10 mg Oral Q8H PRN Patrecia Pace A, PA-C       Or  . metoCLOPramide (REGLAN) injection 5-10 mg  5-10 mg Intravenous Q8H PRN Delray Alt, PA-C      . morphine 2 MG/ML injection 1-2 mg  1-2 mg Intravenous Q3H PRN Delray Alt, PA-C      . ondansetron (ZOFRAN) tablet 4 mg  4 mg Oral Q6H PRN Delray Alt, PA-C       Or  . ondansetron (ZOFRAN) injection 4 mg  4 mg Intravenous Q6H PRN Patrecia Pace A, PA-C      . polyethylene glycol (MIRALAX / GLYCOLAX) packet 17 g  17 g Oral Daily PRN Patrecia Pace A, PA-C   17 g at 06/13/19 1751  . traMADol (ULTRAM) tablet 50 mg  50 mg Oral Q6H PRN Delray Alt, PA-C   50 mg at 06/13/19 1656      Discharge Medications: Please see discharge summary for a list of discharge medications.  Relevant Imaging Results:  Relevant Lab Results:   Additional Information [phone]. Procedure - Cephalomedullary nailing of left intertrochanteric femur fracture on 9/24 for hip fracture.  Sable Feil, LCSW

## 2019-06-15 LAB — BASIC METABOLIC PANEL
Anion gap: 8 (ref 5–15)
BUN: 36 mg/dL — ABNORMAL HIGH (ref 8–23)
CO2: 28 mmol/L (ref 22–32)
Calcium: 9.1 mg/dL (ref 8.9–10.3)
Chloride: 102 mmol/L (ref 98–111)
Creatinine, Ser: 1.36 mg/dL — ABNORMAL HIGH (ref 0.44–1.00)
GFR calc Af Amer: 40 mL/min — ABNORMAL LOW (ref >60)
GFR calc non Af Amer: 35 mL/min — ABNORMAL LOW (ref >60)
Glucose, Bld: 135 mg/dL — ABNORMAL HIGH (ref 70–99)
Potassium: 4.7 mmol/L (ref 3.5–5.1)
Sodium: 138 mmol/L (ref 135–145)

## 2019-06-15 LAB — CBC WITH DIFFERENTIAL/PLATELET
Abs Immature Granulocytes: 0.01 10*3/uL (ref 0.00–0.07)
Basophils Absolute: 0 10*3/uL (ref 0.0–0.1)
Basophils Relative: 0 %
Eosinophils Absolute: 0.2 10*3/uL (ref 0.0–0.5)
Eosinophils Relative: 4 %
HCT: 29.4 % — ABNORMAL LOW (ref 36.0–46.0)
Hemoglobin: 9.5 g/dL — ABNORMAL LOW (ref 12.0–15.0)
Immature Granulocytes: 0 %
Lymphocytes Relative: 31 %
Lymphs Abs: 1.5 10*3/uL (ref 0.7–4.0)
MCH: 29.1 pg (ref 26.0–34.0)
MCHC: 32.3 g/dL (ref 30.0–36.0)
MCV: 89.9 fL (ref 80.0–100.0)
Monocytes Absolute: 0.7 10*3/uL (ref 0.1–1.0)
Monocytes Relative: 15 %
Neutro Abs: 2.4 10*3/uL (ref 1.7–7.7)
Neutrophils Relative %: 50 %
Platelets: 206 10*3/uL (ref 150–400)
RBC: 3.27 MIL/uL — ABNORMAL LOW (ref 3.87–5.11)
RDW: 15.2 % (ref 11.5–15.5)
WBC: 4.8 10*3/uL (ref 4.0–10.5)
nRBC: 0 % (ref 0.0–0.2)

## 2019-06-15 LAB — GLUCOSE, CAPILLARY
Glucose-Capillary: 109 mg/dL — ABNORMAL HIGH (ref 70–99)
Glucose-Capillary: 123 mg/dL — ABNORMAL HIGH (ref 70–99)
Glucose-Capillary: 135 mg/dL — ABNORMAL HIGH (ref 70–99)
Glucose-Capillary: 154 mg/dL — ABNORMAL HIGH (ref 70–99)
Glucose-Capillary: 157 mg/dL — ABNORMAL HIGH (ref 70–99)
Glucose-Capillary: 237 mg/dL — ABNORMAL HIGH (ref 70–99)
Glucose-Capillary: 240 mg/dL — ABNORMAL HIGH (ref 70–99)

## 2019-06-15 LAB — NOVEL CORONAVIRUS, NAA (HOSP ORDER, SEND-OUT TO REF LAB; TAT 18-24 HRS): SARS-CoV-2, NAA: NOT DETECTED

## 2019-06-15 LAB — MAGNESIUM: Magnesium: 2 mg/dL (ref 1.7–2.4)

## 2019-06-15 NOTE — Plan of Care (Signed)
  Problem: Activity: Goal: Risk for activity intolerance will decrease Outcome: Progressing   Problem: Nutrition: Goal: Adequate nutrition will be maintained Outcome: Progressing   Problem: Pain Managment: Goal: General experience of comfort will improve Outcome: Progressing   Problem: Safety: Goal: Ability to remain free from injury will improve Outcome: Progressing   

## 2019-06-15 NOTE — Progress Notes (Signed)
Patient ID: Laura Parks, female   DOB: 12-30-1929, 85 y.o.   MRN: [id number]  PROGRESS NOTE    Laura Parks  EXH:[id number] DOB: [birth year] DOA: 06/09/2019 PCP: Glenda Chroman, MD   Brief Narrative:  Exam-year-old female with history of mild cognitive impairment, coronary disease, hypertension, diabetes mellitus type 2, unspecified CVA, chronic diastolic CHF, paroxysmal A. fib on Eliquis presented on 06/09/2019 with left hip pain after a fall.  She was found to have left hip fracture for which she underwent surgery on 06/09/2019 by orthopedics.  06/15/2019: Patient seen.  No new complaints.  Patient is awaiting disposition.  Results of repeat COVID-19 testing is pending.  Assessment & Plan:   Left hip fracture after a mechanical fall -Status post surgical intervention on 06/09/2019 by orthopedics.  Wound care as per orthopedics recommendations. -PT/OT eval. PT recommending CIR placement.  Patient was denied with CIR for placement.  Social worker consulted for nursing home placement. -Pain control.  Bowel regimen.  Acute blood loss anemia -Probably perioperative blood loss -Hemoglobin 6.4 on 06/11/2019.  Status post 1 unit packed red cell transfusion on 06/11/2019.  Hemoglobin 8.9 this morning.  Monitor 06/15/2019: Hemoglobin is stable.  Hemoglobin today is 9.5 g/dL.  Paroxysmal A. Fib -Currently rate controlled.  Continue Eliquis.  Acute kidney injury on chronic disease stage III  -Improving.  Creatinine 1.45 today.  Baseline creatinine ranging from 1.3-1.5 06/15/2019: Has resolved.  Serum creatinine is at baseline.  Diabetes mellitus type II -A1c 6.9.  Actos on hold.  Continue CBGs with SSI 06/15/2019: Continue to optimize.  Chronic diastolic CHF -EF preserved in June 2020 with grade 2 diastolic dysfunction -Compensated.  Lasix on hold.  Strict input output and daily weights.  History of CAD -Stable.  Continue statin  Probable dementia with mild cognitive impairment -Monitor mental  status.  Fall precaution.  Outpatient follow-up   DVT prophylaxis:  Eliquis Code Status: Full Family Communication: Spoke to husband Siham Bucaro on 06/14/2019 Disposition Plan: SNF once bed is available  Consultants: Orthopedics  Procedures:  Cephalomedullary nailing of left intertrochanteric femur fracture on 06/09/2019  Antimicrobials:  Peri-op antibiotic  Subjective: Patient seen No new complaints. No shortness of breath. No chest pain. No pain reported. No fever or chills.      Objective: Vitals:   06/14/19 1700 06/14/19 2035 06/15/19 0323 06/15/19 0737  BP: 127/71 (!) 131/48 133/60 (!) 124/59  Pulse:  84 82 93  Resp:  17 17 16   Temp: 98.3 F (36.8 C) 97.8 F (36.6 C) 97.8 F (36.6 C) 98.3 F (36.8 C)  TempSrc:  Oral Oral Oral  SpO2: 95% 95% 97% 99%  Weight:      Height:        Intake/Output Summary (Last 24 hours) at 06/15/2019 1103 Last data filed at 06/15/2019 0900 Gross per 24 hour  Intake 720 ml  Output 1100 ml  Net -380 ml   Filed Weights   06/09/19 0400  Weight: 72.6 kg    Examination:  General exam: No acute distress.  Slightly confused to time.  Elderly female lying in bed. Respiratory system: Bilateral decreased breath sounds at bases with some crackles.   Cardiovascular system: S1-S2 heard, rate controlled Gastrointestinal system: Abdomen is nondistended, soft and nontender. Normal bowel sounds heard. Extremities: No cyanosis; trace edema   Data Reviewed: I have personally reviewed following labs and imaging studies  CBC: Recent Labs  Lab 06/09/19 0318  06/10/19 0420 06/11/19 0340 06/12/19 0714 06/13/19 0340 06/14/19  1017 06/15/19 0217  WBC 10.3   < > 7.2 7.4  --  5.8 5.8 4.8  NEUTROABS 7.6  --   --   --   --  3.0 3.2 2.4  HGB 10.7*   < > 7.5* 6.4* 8.8* 9.1* 8.9* 9.5*  HCT 34.7*   < > 23.9* 20.3* 26.3* 27.8* 28.5* 29.4*  MCV 90.8   < > 90.5 90.2  --  89.4 90.2 89.9  PLT 160   < > 115* 103*  --  147* 170 206   < > = values in  this interval not displayed.   Basic Metabolic Panel: Recent Labs  Lab 06/10/19 0420 06/11/19 0340 06/12/19 0336 06/13/19 0340 06/14/19 0523 06/15/19 0217  NA 138 136 139 140 137 138  K 5.2* 4.3 4.8 5.1 5.0 4.7  CL 106 104 104 104 103 102  CO2 25 26 26 29 25 28   GLUCOSE 287* 124* 171* 133* 139* 135*  BUN 34* 48* 42* 34* 35* 36*  CREATININE 1.63* 1.91* 1.52* 1.35* 1.45* 1.36*  CALCIUM 8.5* 8.1* 8.6* 9.0 8.8* 9.1  MG 1.7 1.7  --  1.8 1.7 2.0   GFR: Estimated Creatinine Clearance: 27.2 mL/min (A) (by C-G formula based on SCr of 1.36 mg/dL (H)). Liver Function Tests: No results for input(s): AST, ALT, ALKPHOS, BILITOT, PROT, ALBUMIN in the last 168 hours. No results for input(s): LIPASE, AMYLASE in the last 168 hours. No results for input(s): AMMONIA in the last 168 hours. Coagulation Profile: Recent Labs  Lab 06/09/19 0318  INR 1.1   Cardiac Enzymes: No results for input(s): CKTOTAL, CKMB, CKMBINDEX, TROPONINI in the last 168 hours. BNP (last 3 results) No results for input(s): PROBNP in the last 8760 hours. HbA1C: No results for input(s): HGBA1C in the last 72 hours. CBG: Recent Labs  Lab 06/14/19 1619 06/14/19 2146 06/15/19 0015 06/15/19 0504 06/15/19 0741  GLUCAP 138* 193* 109* 157* 123*   Lipid Profile: No results for input(s): CHOL, HDL, LDLCALC, TRIG, CHOLHDL, LDLDIRECT in the last 72 hours. Thyroid Function Tests: No results for input(s): TSH, T4TOTAL, FREET4, T3FREE, THYROIDAB in the last 72 hours. Anemia Panel: No results for input(s): VITAMINB12, FOLATE, FERRITIN, TIBC, IRON, RETICCTPCT in the last 72 hours. Sepsis Labs: No results for input(s): PROCALCITON, LATICACIDVEN in the last 168 hours.  Recent Results (from the past 240 hour(s))  SARS Coronavirus 2 [facility] order, Performed in [facility] hospital lab) Nasopharyngeal Nasopharyngeal Swab     Status: None   Collection Time: 06/09/19  3:24 AM   Specimen: Nasopharyngeal Swab  Result Value Ref  Range Status   SARS Coronavirus 2 NEGATIVE NEGATIVE Final    Comment: (NOTE) If result is NEGATIVE SARS-CoV-2 target nucleic acids are NOT DETECTED. The SARS-CoV-2 RNA is generally detectable in upper and lower  respiratory specimens during the acute phase of infection. The lowest  concentration of SARS-CoV-2 viral copies this assay can detect is 250  copies / mL. A negative result does not preclude SARS-CoV-2 infection  and should not be used as the sole basis for treatment or other  patient management decisions.  A negative result may occur with  improper specimen collection / handling, submission of specimen other  than nasopharyngeal swab, presence of viral mutation(s) within the  areas targeted by this assay, and inadequate number of viral copies  (<250 copies / mL). A negative result must be combined with clinical  observations, patient history, and epidemiological information. If result is POSITIVE SARS-CoV-2 target nucleic  acids are DETECTED. The SARS-CoV-2 RNA is generally detectable in upper and lower  respiratory specimens dur ing the acute phase of infection.  Positive  results are indicative of active infection with SARS-CoV-2.  Clinical  correlation with patient history and other diagnostic information is  necessary to determine patient infection status.  Positive results do  not rule out bacterial infection or co-infection with other viruses. If result is PRESUMPTIVE POSTIVE SARS-CoV-2 nucleic acids MAY BE PRESENT.   A presumptive positive result was obtained on the submitted specimen  and confirmed on repeat testing.  While 2019 novel coronavirus  (SARS-CoV-2) nucleic acids may be present in the submitted sample  additional confirmatory testing may be necessary for epidemiological  and / or clinical management purposes  to differentiate between  SARS-CoV-2 and other Sarbecovirus currently known to infect humans.  If clinically indicated additional testing with an  alternate test  methodology [phone]) is advised. The SARS-CoV-2 RNA is generally  detectable in upper and lower respiratory sp ecimens during the acute  phase of infection. The expected result is Negative. Fact Sheet for Patients:  StrictlyIdeas.no Fact Sheet for Healthcare Providers: BankingDealers.co.za This test is not yet approved or cleared by the [place] FDA and has been authorized for detection and/or diagnosis of SARS-CoV-2 by FDA under an Emergency Use Authorization (EUA).  This EUA will remain in effect (meaning this test can be used) for the duration of the COVID-19 declaration under Section 564(b)(1) of the Act, 21 U.S.C. section 360bbb-3(b)(1), unless the authorization is terminated or revoked sooner. Performed at [facility], [place] [address], [place], [place] [address]          Radiology Studies: No results found.      Scheduled Meds: . sodium chloride   Intravenous Once  . acetaminophen  650 mg Oral Q6H  . amLODipine  5 mg Oral Daily  . apixaban  5 mg Oral BID  . atorvastatin  10 mg Oral q1800  . docusate sodium  100 mg Oral BID  . gabapentin  100 mg Oral TID  . insulin aspart  0-9 Units Subcutaneous Q4H   Continuous Infusions: . lactated ringers Stopped (06/09/19 1554)  . methocarbamol (ROBAXIN) IV       LOS: 6 days   Bonnell Public, MD [facility] 06/15/2019, 11:03 AM

## 2019-06-15 NOTE — TOC Progression Note (Addendum)
Transition of Care [facility]) - Progression Note    Patient Details  Name: Laura Parks MRN: [id number] Date of Birth: Feb 08, 1930  Transition of Care [facility]) CM/SW Contact  Sharlet Salina Mila Homer, LCSW Phone Number: 06/15/2019, 5:38 PM  Clinical Narrative:  CSW talked with patient and son at bedside regarding patient's discharge to SNF, provided facility responses and [facility] chosen. CSW advised son that he will be contacted if patient discharges today, as son planned to leave hospital around 12 noon, and indicated that he would be at home the rest of the day. Call made to Deliah Boston., admissions director and they can take patient once COVID test results in. Call made to admissions director at 3:52 pm and advised that test results not in.    *As of 5:43 pm, COVID results pending.    Expected Discharge Plan: Skilled Nursing Facility Barriers to Discharge: SNF Pending bed offer  Expected Discharge Plan and Services Expected Discharge Plan: [place] In-house Referral: Clinical Social Work   Post Acute Care Choice: [place] Living arrangements for the past 2 months: Single Family Home                                     Social Determinants of Health (SDOH) Interventions  No SDOH interventions needed at this time  Readmission Risk Interventions No flowsheet data found.

## 2019-06-16 DIAGNOSIS — R0902 Hypoxemia: Secondary | ICD-10-CM | POA: Diagnosis not present

## 2019-06-16 DIAGNOSIS — E119 Type 2 diabetes mellitus without complications: Secondary | ICD-10-CM | POA: Diagnosis not present

## 2019-06-16 DIAGNOSIS — H3530 Unspecified macular degeneration: Secondary | ICD-10-CM | POA: Diagnosis not present

## 2019-06-16 DIAGNOSIS — I480 Paroxysmal atrial fibrillation: Secondary | ICD-10-CM | POA: Diagnosis not present

## 2019-06-16 DIAGNOSIS — S72352D Displaced comminuted fracture of shaft of left femur, subsequent encounter for closed fracture with routine healing: Secondary | ICD-10-CM | POA: Diagnosis not present

## 2019-06-16 DIAGNOSIS — S72142D Displaced intertrochanteric fracture of left femur, subsequent encounter for closed fracture with routine healing: Secondary | ICD-10-CM | POA: Diagnosis not present

## 2019-06-16 DIAGNOSIS — I5032 Chronic diastolic (congestive) heart failure: Secondary | ICD-10-CM | POA: Diagnosis not present

## 2019-06-16 DIAGNOSIS — Z8679 Personal history of other diseases of the circulatory system: Secondary | ICD-10-CM | POA: Diagnosis not present

## 2019-06-16 DIAGNOSIS — N289 Disorder of kidney and ureter, unspecified: Secondary | ICD-10-CM | POA: Diagnosis not present

## 2019-06-16 DIAGNOSIS — S7292XD Unspecified fracture of left femur, subsequent encounter for closed fracture with routine healing: Secondary | ICD-10-CM | POA: Diagnosis not present

## 2019-06-16 DIAGNOSIS — R279 Unspecified lack of coordination: Secondary | ICD-10-CM | POA: Diagnosis not present

## 2019-06-16 DIAGNOSIS — W010XXA Fall on same level from slipping, tripping and stumbling without subsequent striking against object, initial encounter: Secondary | ICD-10-CM | POA: Diagnosis not present

## 2019-06-16 DIAGNOSIS — I509 Heart failure, unspecified: Secondary | ICD-10-CM | POA: Diagnosis not present

## 2019-06-16 DIAGNOSIS — Z7901 Long term (current) use of anticoagulants: Secondary | ICD-10-CM | POA: Diagnosis not present

## 2019-06-16 DIAGNOSIS — D649 Anemia, unspecified: Secondary | ICD-10-CM | POA: Diagnosis not present

## 2019-06-16 DIAGNOSIS — Z23 Encounter for immunization: Secondary | ICD-10-CM | POA: Diagnosis not present

## 2019-06-16 DIAGNOSIS — S72002A Fracture of unspecified part of neck of left femur, initial encounter for closed fracture: Secondary | ICD-10-CM | POA: Diagnosis not present

## 2019-06-16 DIAGNOSIS — R2689 Other abnormalities of gait and mobility: Secondary | ICD-10-CM | POA: Diagnosis not present

## 2019-06-16 DIAGNOSIS — F0150 Vascular dementia without behavioral disturbance: Secondary | ICD-10-CM | POA: Diagnosis not present

## 2019-06-16 DIAGNOSIS — F339 Major depressive disorder, recurrent, unspecified: Secondary | ICD-10-CM | POA: Diagnosis not present

## 2019-06-16 DIAGNOSIS — Z8781 Personal history of (healed) traumatic fracture: Secondary | ICD-10-CM | POA: Diagnosis not present

## 2019-06-16 DIAGNOSIS — R41841 Cognitive communication deficit: Secondary | ICD-10-CM | POA: Diagnosis not present

## 2019-06-16 DIAGNOSIS — Z8673 Personal history of transient ischemic attack (TIA), and cerebral infarction without residual deficits: Secondary | ICD-10-CM | POA: Diagnosis not present

## 2019-06-16 DIAGNOSIS — I4891 Unspecified atrial fibrillation: Secondary | ICD-10-CM | POA: Diagnosis not present

## 2019-06-16 DIAGNOSIS — M6281 Muscle weakness (generalized): Secondary | ICD-10-CM | POA: Diagnosis not present

## 2019-06-16 DIAGNOSIS — Y92009 Unspecified place in unspecified non-institutional (private) residence as the place of occurrence of the external cause: Secondary | ICD-10-CM | POA: Diagnosis not present

## 2019-06-16 DIAGNOSIS — Z743 Need for continuous supervision: Secondary | ICD-10-CM | POA: Diagnosis not present

## 2019-06-16 DIAGNOSIS — H548 Legal blindness, as defined in USA: Secondary | ICD-10-CM | POA: Diagnosis not present

## 2019-06-16 DIAGNOSIS — Z9181 History of falling: Secondary | ICD-10-CM | POA: Diagnosis not present

## 2019-06-16 DIAGNOSIS — I2510 Atherosclerotic heart disease of native coronary artery without angina pectoris: Secondary | ICD-10-CM | POA: Diagnosis not present

## 2019-06-16 DIAGNOSIS — E1159 Type 2 diabetes mellitus with other circulatory complications: Secondary | ICD-10-CM | POA: Diagnosis not present

## 2019-06-16 DIAGNOSIS — R410 Disorientation, unspecified: Secondary | ICD-10-CM | POA: Diagnosis not present

## 2019-06-16 DIAGNOSIS — I110 Hypertensive heart disease with heart failure: Secondary | ICD-10-CM | POA: Diagnosis not present

## 2019-06-16 DIAGNOSIS — I10 Essential (primary) hypertension: Secondary | ICD-10-CM | POA: Diagnosis not present

## 2019-06-16 LAB — GLUCOSE, CAPILLARY
Glucose-Capillary: 136 mg/dL — ABNORMAL HIGH (ref 70–99)
Glucose-Capillary: 155 mg/dL — ABNORMAL HIGH (ref 70–99)

## 2019-06-16 MED ORDER — DOCUSATE SODIUM 100 MG PO CAPS: 100.0000 mg | ORAL_CAPSULE | Freq: Two times a day (BID) | ORAL | 0 refills | Status: DC

## 2019-06-16 MED ORDER — ATORVASTATIN CALCIUM 10 MG PO TABS: 10.0000 mg | ORAL_TABLET | Freq: Every day | ORAL | 0 refills | Status: DC

## 2019-06-16 MED ORDER — METHOCARBAMOL 500 MG PO TABS: 500.0000 mg | ORAL_TABLET | Freq: Four times a day (QID) | ORAL | 0 refills | Status: DC | PRN

## 2019-06-16 MED ORDER — GABAPENTIN 100 MG PO CAPS: 100.0000 mg | ORAL_CAPSULE | Freq: Three times a day (TID) | ORAL | 0 refills | Status: DC

## 2019-06-16 MED ORDER — POLYETHYLENE GLYCOL 3350 17 G PO PACK: 17.0000 g | PACK | Freq: Every day | ORAL | 0 refills | Status: DC | PRN

## 2019-06-16 MED ORDER — APIXABAN 5 MG PO TABS: 5.0000 mg | ORAL_TABLET | Freq: Two times a day (BID) | ORAL | 0 refills | Status: DC

## 2019-06-16 MED ORDER — ACETAMINOPHEN 325 MG PO TABS: 650.0000 mg | ORAL_TABLET | Freq: Four times a day (QID) | ORAL | 0 refills | Status: DC

## 2019-06-16 NOTE — Progress Notes (Signed)
Report called to receiving facility. Nurse Roxanne,LPN given oral written and updated on PTAR ETA. Pt's spouse updated on transfer and pt is in agreement.

## 2019-06-16 NOTE — Progress Notes (Signed)
Pharmacy - > Eliquis  85 y.o female admitted on 06/09/19 for Mechanical fall>left hip fracture.  S/p surgery  surgery for hip repair on 06/09/2019.  She was on Eliquis pta for h/o Afib/CVA.   On 06/10/19 resumed pta Eliquis for AFib / CVA post hip surgery  Prior to admission she was taking Eliquis 5 mg po BID  Scr increased to 1.63 on 9/25 thus we reduced  Apixaban to 2.5mg  BID.   SCr improved to 1.35 on 9/28 and Apixaban increased back to PTA dose of 5 mg BID.  SCr is 1.36 on 9/30. No labs today.  No bleeding noted   Plan: Continue Apixaban to 5 mg BID  Follow up AM BMET  Thank you Nicole Cella, RPh Clinical Pharmacist [phone] Please check AMION for all [place] phone numbers After 10:00 PM, call [place] [phone] 06/16/2019 2:18 PM

## 2019-06-16 NOTE — TOC Transition Note (Signed)
Transition of Care (TOC) - CM/SW Discharge Note *Discharged to [facility] via ambulance *Room Number 127 *Number for Report - [phone]   Patient Details  Name: PAMA ROSKOS MRN: [id number] Date of Birth: 1930/05/06  Transition of Care [facility]) CM/SW Contact:  Sable Feil, LCSW Phone Number: 06/16/2019, 1:45 PM   Clinical Narrative:  COVID test resulted (negative) and patient will discharge today to [facility] for ST rehab. Patient advised at the bedside and husband contacted and informed. Contact made with Deliah Boston., admissions director at facility and discharge clinicals transmitted to facility. COVID test result will go in patient's discharge packet.    Final next level of care: Skilled Nursing Facility Barriers to Discharge: No Barriers Identified   Patient Goals and CMS Choice Patient states their goals for this hospitalization and ongoing recovery are:: Patient wants to receive rehab before returning home as her husband is unable to assist her physically at home CMS Medicare.gov Compare Post Acute Care list provided to:: Patient Choice offered to / list presented to : Patient  Discharge Placement - [place] in [place] number confirmed : 06/14/19            Patient to be transferred to facility by: Ambulance Name of family member notified: Husband Kylieann Eagles [phone] Patient and family notified of of transfer: 06/16/19  Discharge Plan and Services In-house Referral: Clinical Social Work   Post Acute Care Choice: [facility]                              Social Determinants of Health (SDOH) Interventions  No SDOH interventions needed at discharge   Readmission Risk Interventions No flowsheet data found.

## 2019-06-16 NOTE — Discharge Summary (Signed)
Physician Discharge Summary  Patient ID: Laura Parks MRN: [id number] DOB/AGE: 83-May-1931 83 y.o.  Admit date: 06/09/2019 Discharge date: 06/16/2019  Admission Diagnoses:  Discharge Diagnoses:  Principal Problem:   Closed left hip fracture [facility]) Active Problems:   Type 2 diabetes mellitus (HCC)   Stroke (HCC)   PAF (paroxysmal atrial fibrillation) (HCC)   Vascular dementia ([place])   Renal insufficiency   Discharged Condition: stable  Hospital Course: Patient is an 85 year old Caucasian female with past medical history significant for mild cognitive impairment, coronary disease, hypertension, diabetes mellitus type 2, unspecified CVA, chronic diastolic CHF and paroxysmal A. fib on Eliquis.  Patient was admitted with left hip fracture following a fall.  Patient was admitted for further assessment and management.  Orthopedic team was consulted to direct patient's care.  Patient underwent cephalo-medullary nailing of left intertrochanteric femur fracture.  Patient has been cleared for discharge by the orthopedic team.    Left hip fracture after a mechanical fall -Patient underwent nailing of the left hip fracture. -Orthopedic team directed care. -Orthopedic team has cleared patient for discharge. -Follow-up with orthopedics on discharge.    Acute blood loss anemia -Likely secondary to surgery/perioperative blood loss -Hemoglobin 6 prior to discharge was 9.5 g/dL.  Paroxysmal A. Fib: -Currently rate controlled.   -Continue Eliquis. -Toprol-XL and Cardizem are currently on hold. -Follow-up with primary care provider in 1 week.  PCP to review medications and manage accordingly.  Acute kidney injury on chronic disease stage III: -Acute kidney injury has resolved.   -No function is back to baseline.    Diabetes mellitus type II -A1c 6.9.   -Blood sugar was monitored closely during the hospital stay and sliding scale insulin coverage was utilized.    Chronic diastolic CHF: -EF  preserved in June 2020 with grade 2 diastolic dysfunction -Compensated.   -Lasix on hold.   -Strict input output and daily weights. -Follow-up with primary care provider within 1 week of discharge.  Primary care provider to review medications and manage accordingly.  History of CAD: -Stable.   -Continue statin  Probable dementia with mild cognitive impairment: -No behavioral problems noted during the hospital stay. -Continue to manage expectantly. -Follow-up with primary care provider on discharge.    Consults: orthopedic surgery  Treatments: Cephalomedullary nailing of left intertrochanteric femur fracture  Discharge Exam: Blood pressure 127/63, pulse 92, temperature 98.6 F (37 C), temperature source Oral, resp. rate 17, height 5\' 2"  (1.575 m), weight 72.6 kg, SpO2 95 %.  Disposition: Discharge disposition: 03-Skilled Nursing Facility   Discharge Instructions    Diet - low sodium heart healthy   Complete by: As directed    Increase activity slowly   Complete by: As directed      Allergies as of 06/16/2019      Reactions   Codeine Nausea Only      Medication List    STOP taking these medications   cholestyramine 4 GM/DOSE powder Commonly known as: QUESTRAN   citalopram 20 MG tablet Commonly known as: CELEXA   diltiazem 120 MG 24 hr capsule Commonly known as: CARDIZEM CD   donepezil 10 MG tablet Commonly known as: ARICEPT   furosemide 20 MG tablet Commonly known as: LASIX   lisinopril 10 MG tablet Commonly known as: ZESTRIL   meclizine 25 MG tablet Commonly known as: ANTIVERT   mirtazapine 15 MG tablet Commonly known as: REMERON   ondansetron 4 MG disintegrating tablet Commonly known as: ZOFRAN-ODT   PARoxetine 20 MG tablet Commonly  known as: PAXIL   potassium chloride 10 MEQ tablet Commonly known as: KLOR-CON   pravastatin 40 MG tablet Commonly known as: PRAVACHOL   SENOKOT PO   Toprol XL 25 MG 24 hr tablet Generic drug: metoprolol  succinate     TAKE these medications   acetaminophen 325 MG tablet Commonly known as: TYLENOL Take 2 tablets (650 mg total) by mouth every 6 (six) hours. What changed:   medication strength  how much to take  when to take this  reasons to take this   alendronate 70 MG tablet Commonly known as: FOSAMAX Take 70 mg by mouth every 7 (seven) days. Take with a full glass of water on an empty stomach. - on Fridays   amLODipine 5 MG tablet Commonly known as: NORVASC Take 1 tablet (5 mg total) by mouth daily.   apixaban 5 MG Tabs tablet Commonly known as: ELIQUIS Take 1 tablet (5 mg total) by mouth 2 (two) times daily. What changed:   medication strength  how much to take   atorvastatin 10 MG tablet Commonly known as: LIPITOR Take 1 tablet (10 mg total) by mouth daily at 6 PM. What changed: when to take this   docusate sodium 100 MG capsule Commonly known as: COLACE Take 1 capsule (100 mg total) by mouth 2 (two) times daily.   ergocalciferol 1.25 MG (50000 UT) capsule Commonly known as: VITAMIN D2 Take 50,000 Units by mouth once a week.   ferrous sulfate 325 (65 FE) MG tablet Take 325 mg by mouth daily with breakfast.   gabapentin 100 MG capsule Commonly known as: NEURONTIN Take 1 capsule (100 mg total) by mouth 3 (three) times daily.   methocarbamol 500 MG tablet Commonly known as: ROBAXIN Take 1 tablet (500 mg total) by mouth every 6 (six) hours as needed for muscle spasms.   pioglitazone 15 MG tablet Commonly known as: ACTOS Take 15 mg by mouth daily.   polyethylene glycol 17 g packet Commonly known as: MIRALAX / GLYCOLAX Take 17 g by mouth daily as needed for mild constipation.   Vitamin B-12 2500 MCG Subl Place 2,500 mcg under the tongue daily.      Follow-up Information    Haddix, Thomasene Lot, MD. Schedule an appointment as soon as possible for a visit in 2 week(s).   Specialty: Orthopedic Surgery Why: repeat x-rays Contact information: [place] [address] [phone]           Signed: Bonnell Public 06/16/2019, 12:18 PM

## 2019-06-16 NOTE — Consult Note (Signed)
   [facility] CM Inpatient Consult   06/16/2019  GLORIOUS FLICKER 1929-11-21 [id number]    Follow-up Note:  Following this Medicare/ [employer] patient's disposition, and noted the change of plan from CIR  to skilled nursing facility(SNF).  Per transition of care SW note, patient is discharge today to [facility]) for ST rehab. Patient wants to receive rehab before returning home as her husband is unable to assist her physically at home.  Plan: Will notify [facility] post acute RN coordinator of patient's disposition to SNF for post discharge follow up of needs.   For questions and additional information, please call:  Silveria Botz A. Amaad Byers, BSN, RN-BC [facility] Liaison Cell: [phone]

## 2019-06-17 DIAGNOSIS — S7292XD Unspecified fracture of left femur, subsequent encounter for closed fracture with routine healing: Secondary | ICD-10-CM | POA: Diagnosis not present

## 2019-06-17 DIAGNOSIS — E119 Type 2 diabetes mellitus without complications: Secondary | ICD-10-CM | POA: Diagnosis not present

## 2019-06-17 DIAGNOSIS — I480 Paroxysmal atrial fibrillation: Secondary | ICD-10-CM | POA: Diagnosis not present

## 2019-06-17 DIAGNOSIS — Z8673 Personal history of transient ischemic attack (TIA), and cerebral infarction without residual deficits: Secondary | ICD-10-CM | POA: Diagnosis not present

## 2019-06-18 LAB — GLUCOSE, CAPILLARY: Glucose-Capillary: 188 mg/dL — ABNORMAL HIGH (ref 70–99)

## 2019-06-21 DIAGNOSIS — I4891 Unspecified atrial fibrillation: Secondary | ICD-10-CM | POA: Diagnosis not present

## 2019-06-21 DIAGNOSIS — S7292XD Unspecified fracture of left femur, subsequent encounter for closed fracture with routine healing: Secondary | ICD-10-CM | POA: Diagnosis not present

## 2019-06-21 DIAGNOSIS — D649 Anemia, unspecified: Secondary | ICD-10-CM | POA: Diagnosis not present

## 2019-07-04 DIAGNOSIS — S72142D Displaced intertrochanteric fracture of left femur, subsequent encounter for closed fracture with routine healing: Secondary | ICD-10-CM | POA: Diagnosis not present

## 2019-07-05 DIAGNOSIS — Z8781 Personal history of (healed) traumatic fracture: Secondary | ICD-10-CM | POA: Diagnosis not present

## 2019-07-05 DIAGNOSIS — E119 Type 2 diabetes mellitus without complications: Secondary | ICD-10-CM | POA: Diagnosis not present

## 2019-07-05 DIAGNOSIS — R410 Disorientation, unspecified: Secondary | ICD-10-CM | POA: Diagnosis not present

## 2019-07-08 ENCOUNTER — Other Ambulatory Visit: Payer: Self-pay | Admitting: *Deleted

## 2019-07-08 NOTE — Patient Outreach (Signed)
Late entry for 07/07/19  Member assessed for potential [facility] Care Management needs as a benefit of  [place] Medicare.  Member is currently receiving rehab therapy at [facility].  Member discussed in weekly telephonic IDT meeting with facility staff, [facility] UM team, and writer.  Facility reports member will return home with family. Facility has notified family the need for 24/7 supervision. Likely return home next week.   Marthenia Rolling, MSN-Ed, RN,BSN [place] Acute Care Coordinator [phone] [facility]) [phone]  (Toll free office)

## 2019-07-12 DIAGNOSIS — I509 Heart failure, unspecified: Secondary | ICD-10-CM | POA: Diagnosis not present

## 2019-07-12 DIAGNOSIS — I10 Essential (primary) hypertension: Secondary | ICD-10-CM | POA: Diagnosis not present

## 2019-07-12 DIAGNOSIS — I4891 Unspecified atrial fibrillation: Secondary | ICD-10-CM | POA: Diagnosis not present

## 2019-07-18 ENCOUNTER — Other Ambulatory Visit: Payer: Self-pay | Admitting: *Deleted

## 2019-07-18 NOTE — Patient Outreach (Signed)
Member assessed for potential [facility] Care Management needs as a benefit of  [place] Medicare.  Member is currently receiving rehab therapy at [facility].  Disposition plan is to return home with husband and son. Telephone call to [facility] home to discuss potential [place] Management needs and disposition. No answer. Phone rang and rang.   Will plan on making referral to [place] Management upon dc form SNF.   Marthenia Rolling, MSN-Ed, RN,BSN [place] Acute Care Coordinator [phone] [facility]) [phone]  (Toll free office)

## 2019-07-21 DIAGNOSIS — I509 Heart failure, unspecified: Secondary | ICD-10-CM | POA: Diagnosis not present

## 2019-07-21 DIAGNOSIS — I10 Essential (primary) hypertension: Secondary | ICD-10-CM | POA: Diagnosis not present

## 2019-07-21 DIAGNOSIS — Z8679 Personal history of other diseases of the circulatory system: Secondary | ICD-10-CM | POA: Diagnosis not present

## 2019-07-21 DIAGNOSIS — Z8781 Personal history of (healed) traumatic fracture: Secondary | ICD-10-CM | POA: Diagnosis not present

## 2019-07-22 ENCOUNTER — Other Ambulatory Visit: Payer: Self-pay | Admitting: *Deleted

## 2019-07-22 DIAGNOSIS — I10 Essential (primary) hypertension: Secondary | ICD-10-CM

## 2019-07-22 NOTE — Patient Outreach (Signed)
Member assessed for potential [facility] Care Management needs as a benefit of [place] Medicare.  Verified in Patient Laura Parks that member discharged from [facility] SNF to home with home health care services.   Attempted to outreach to family prior to dc from SNF without success.   [name] lived with her husband and son prior to SNF. She has a medical history of CVA, CAD, HTN, DM, CHF, AFIB, s/p left hip surgery, vascular dementia.  Will make referral to [place] for ongoing follow up.    Marthenia Rolling, MSN-Ed, RN,BSN [place] Acute Care Coordinator [phone] [facility]) [phone]  (Toll free office)

## 2019-07-25 ENCOUNTER — Encounter: Payer: Self-pay | Admitting: *Deleted

## 2019-07-25 ENCOUNTER — Other Ambulatory Visit: Payer: Self-pay | Admitting: *Deleted

## 2019-07-25 NOTE — Patient Outreach (Signed)
[place] [facility]) Care Management  07/25/2019  ERCEL NORMOYLE 05-Dec-1929 [id number]   Transition of Care Referral   Referral Date: 07/22/19 Referral Source: [facility] RN CM  Please assign to [facility] RN CM for complex case management. Discharged from [facility] on 07/22/19. Unable to reach family prior to SNF dc. Will have home health services. Has multiple co morbidities. Lives with husband and son. Please call with questions. Thanks. Marthenia Rolling, MSN-Ed, RN,BSN-THN Post Acute Care Coordinator-[phone] Date of Admission:  06/16/19 Date of Discharge:07/19/19 Insurance: [employer] Medicare and Tricare for Life   Outreach attempt #1 successful to the home number Spoke with Daughter [name] as she is POA and pt has vascular dementia [name] is able to verify HIPAA, DOB and address Reviewed and addressed Transitional of care referral with Vincent Gros reports she arrived on 07/24/19 to visit for the week from out of town  She reports ALL calls should come to her.  Social: Mrs ROCSI HAZELBAKER is a 85 year old female who lives at home with her husband Marchia Bond and her son Her husband Marchia Bond is reported unable to assist her.  Conditions: closed left hip fx after mechanical fall 06/09/19 with  s/p left hip surgery, DM (A1c 6.9) , stroke, PAD, vascular dementia, renal insufficiency, CVA, CAD, HTN, chronic diastolic CHF EF preserved, AFIB on eliquis, anemia, legal blindness, never smoker   DME Walker, cbg monitor, scales   Medications: [name] denies concerns with taking medications as prescribed, affording medications, side effects of medications and questions about medications   Appointments: To f/u with Dr Woody Seller on Thursday 07/28/19  Use express scripts  Advance directives: HPOA is [name]  Consent: [facility]N RN CM reviewed [facility] services with patient. Patient gave verbal consent for services. Advised patient that other post discharge calls may occur to assess how the patient is  doing following the recent hospitalization. Patient voiced understanding and was appreciative of f/u call.  Plan: [facility] RN CM will update [facility] RN CM [name] made aware that Bryn Gulling will follow up with her and further assessment within 7-14 business days  Encouraged to return a call to [facility] RN CM prn  [facility] RN CM sent a successful outreach letter as discussed with [facility] brochure enclosed for review  Routed note to MDs/NP/PA  [facility] CM Care Plan Problem One     Most Recent Value  Care Plan Problem One  risk for readmission  Role Documenting the Problem One  Care Management Telephonic Coordinator  Care Plan for Problem One  Active  THN Long Term Goal   Over the next 31 days, patient will not experience hospital readmission, as evidence by patient reporting and review of EMR during [place] outreach  [place] Term Goal Start Date  07/25/19  Interventions for Problem One Long Term Goal  Transition of care assessment completed with daughter, encourage return call prn, Successful outreach letter sent  [facility] CM Short Term Goal #1   over the next 7 days patietn will compleet the hospital follow up appointment with primary care provider as evidence by reporting during follow up outreach call  [facility] CM Short Term Goal #1 Start Date  07/25/19  Interventions for Short Term Goal #1  reviewed transition of care, discussed follow up appointment, discussed transportation to appointment, discussed importance of follow up medical appointment      [place]arke. Lavina Hamman, RN, BSN, CCM [facility] Telephonic Care Management Care Coordinator Direct Number [phone])  [place] number [phone]  Main THN number [phone] Fax number [phone]

## 2019-07-26 DIAGNOSIS — W19XXXD Unspecified fall, subsequent encounter: Secondary | ICD-10-CM | POA: Diagnosis not present

## 2019-07-26 DIAGNOSIS — F329 Major depressive disorder, single episode, unspecified: Secondary | ICD-10-CM | POA: Diagnosis not present

## 2019-07-26 DIAGNOSIS — Z7982 Long term (current) use of aspirin: Secondary | ICD-10-CM | POA: Diagnosis not present

## 2019-07-26 DIAGNOSIS — I6529 Occlusion and stenosis of unspecified carotid artery: Secondary | ICD-10-CM | POA: Diagnosis not present

## 2019-07-26 DIAGNOSIS — I69318 Other symptoms and signs involving cognitive functions following cerebral infarction: Secondary | ICD-10-CM | POA: Diagnosis not present

## 2019-07-26 DIAGNOSIS — I2510 Atherosclerotic heart disease of native coronary artery without angina pectoris: Secondary | ICD-10-CM | POA: Diagnosis not present

## 2019-07-26 DIAGNOSIS — N183 Chronic kidney disease, stage 3 unspecified: Secondary | ICD-10-CM | POA: Diagnosis not present

## 2019-07-26 DIAGNOSIS — I5032 Chronic diastolic (congestive) heart failure: Secondary | ICD-10-CM | POA: Diagnosis not present

## 2019-07-26 DIAGNOSIS — I480 Paroxysmal atrial fibrillation: Secondary | ICD-10-CM | POA: Diagnosis not present

## 2019-07-26 DIAGNOSIS — F0150 Vascular dementia without behavioral disturbance: Secondary | ICD-10-CM | POA: Diagnosis not present

## 2019-07-26 DIAGNOSIS — Z7901 Long term (current) use of anticoagulants: Secondary | ICD-10-CM | POA: Diagnosis not present

## 2019-07-26 DIAGNOSIS — H3530 Unspecified macular degeneration: Secondary | ICD-10-CM | POA: Diagnosis not present

## 2019-07-26 DIAGNOSIS — S72352D Displaced comminuted fracture of shaft of left femur, subsequent encounter for closed fracture with routine healing: Secondary | ICD-10-CM | POA: Diagnosis not present

## 2019-07-26 DIAGNOSIS — E1122 Type 2 diabetes mellitus with diabetic chronic kidney disease: Secondary | ICD-10-CM | POA: Diagnosis not present

## 2019-07-26 DIAGNOSIS — Z9181 History of falling: Secondary | ICD-10-CM | POA: Diagnosis not present

## 2019-07-26 DIAGNOSIS — H548 Legal blindness, as defined in USA: Secondary | ICD-10-CM | POA: Diagnosis not present

## 2019-07-26 DIAGNOSIS — M810 Age-related osteoporosis without current pathological fracture: Secondary | ICD-10-CM | POA: Diagnosis not present

## 2019-07-26 DIAGNOSIS — Z7984 Long term (current) use of oral hypoglycemic drugs: Secondary | ICD-10-CM | POA: Diagnosis not present

## 2019-07-26 DIAGNOSIS — I130 Hypertensive heart and chronic kidney disease with heart failure and stage 1 through stage 4 chronic kidney disease, or unspecified chronic kidney disease: Secondary | ICD-10-CM | POA: Diagnosis not present

## 2019-07-27 ENCOUNTER — Encounter: Payer: Self-pay | Admitting: *Deleted

## 2019-07-27 ENCOUNTER — Other Ambulatory Visit: Payer: Self-pay | Admitting: *Deleted

## 2019-07-27 NOTE — Patient Outreach (Signed)
Outreach call to patient's daughter Consuelo Pandy for initial telephone assessment, spoke with [name], HIPAA verified, [name] lives in [place] and will be here for several more days, pt lives with her spouse and adult son, also has assistance from [place] aunt and home health PT, OT, RN , CNA. Patient's son manages medications.  Per [name], pt has vascular dementia "but it's not really that bad yet"  Pt fell and fractured her right hip and went to rehab at [facility], pt has diabetes with AIC of 6.9 and daughter reports pt does well with this and "eats like she should"  CBG was 190 today and due to the extra stress of getting back home, CBG has been elevated on occasion,  RN CM ask daughter to take CBG log to MD appointment tomorrow.  Pt has CHF but is not always able to weigh due to safety issues.  RN CM reviewed HF action plan with daughter. [name] feels being at risk for falls is concern now after pt fractured her hip.  Home health PT is working with pt.  RN CM faxed today's note and barrier letter to primary MD.    Outpatient Encounter Medications as of 07/27/2019  Medication Sig  . acetaminophen (TYLENOL) 325 MG tablet Take 2 tablets (650 mg total) by mouth every 6 (six) hours.  Marland Kitchen alendronate (FOSAMAX) 70 MG tablet Take 70 mg by mouth every 7 (seven) days. Take with a full glass of water on an empty stomach. - on Fridays  . apixaban (ELIQUIS) 5 MG TABS tablet Take 1 tablet (5 mg total) by mouth 2 (two) times daily.  Marland Kitchen atorvastatin (LIPITOR) 10 MG tablet Take 1 tablet (10 mg total) by mouth daily at 6 PM.  . Cyanocobalamin (VITAMIN B-12) 2500 MCG SUBL Place 2,500 mcg under the tongue daily.   Marland Kitchen docusate sodium (COLACE) 100 MG capsule Take 1 capsule (100 mg total) by mouth 2 (two) times daily.  . ergocalciferol (VITAMIN D2) 50000 units capsule Take 50,000 Units by mouth once a week.  . pioglitazone (ACTOS) 15 MG tablet Take 15 mg by mouth daily.  Marland Kitchen amLODipine (NORVASC) 5 MG tablet Take 1 tablet (5  mg total) by mouth daily.  . ferrous sulfate 325 (65 FE) MG tablet Take 325 mg by mouth daily with breakfast.  . gabapentin (NEURONTIN) 100 MG capsule Take 1 capsule (100 mg total) by mouth 3 (three) times daily. (Patient not taking: Reported on 07/27/2019)  . methocarbamol (ROBAXIN) 500 MG tablet Take 1 tablet (500 mg total) by mouth every 6 (six) hours as needed for muscle spasms. (Patient not taking: Reported on 07/27/2019)  . polyethylene glycol (MIRALAX / GLYCOLAX) 17 g packet Take 17 g by mouth daily as needed for mild constipation. (Patient not taking: Reported on 07/27/2019)   No facility-administered encounter medications on file as of 07/27/2019.    [facility] CM Care Plan Problem One     Most Recent Value  Care Plan Problem One  risk for readmission  Role Documenting the Problem One  Care Management Telephonic Coordinator  Care Plan for Problem One  Active  THN Long Term Goal   Over the next 31 days, patient will not experience hospital readmission, as evidence by patient reporting and review of EMR during [place] outreach  [facility] Long Term Goal Start Date  07/25/19  Interventions for Problem One Long Term Goal  RN CM established plan of care with daughter, reviewed medications, mailed successful outreach letter with consent form, pamphlet,  24 hour nurse line magnet, emailed EMMI education to daughter  [facility] CM Short Term Goal #1   over the next 7 days patietn will compleet the hospital follow up appointment with primary care provider as evidence by reporting during follow up outreach call  [facility] CM Short Term Goal #1 Start Date  07/25/19  Interventions for Short Term Goal #1  Pt is to see primary care tomorrow 07/28/19    [facility] CM Care Plan Problem Two     Most Recent Value  Care Plan Problem Two  High risk for falls  Role Documenting the Problem Two  Care Management [place] for Problem Two  Active  THN CM Short Term Goal #1   Pt / family will utlize safety precautions and  report no falls within 30 days  THN CM Short Term Goal #1 Start Date  07/27/19  Interventions for Short Term Goal #2   RN CM reviewed safety precautions, importance of using DME and assisting pt as needed      PLAN Continue weekly transition of care calls  Jacqlyn Larsen [facility], [place] Coordinator [phone]

## 2019-08-03 ENCOUNTER — Other Ambulatory Visit: Payer: Self-pay | Admitting: *Deleted

## 2019-08-03 NOTE — Patient Outreach (Signed)
Outreach call to patient's daughter Consuelo Pandy, HIPAA verified, [name] states she is on the way back to [place], pt continues to have her adult son to assist as well as Teresa's aunt and per [name] " they have plenty of help"  Home health working "very well" per [name], denies any falls, CBG today 116 and most readings in low 100's,  No new concerns voiced.  [facility] CM Care Plan Problem One     Most Recent Value  Care Plan Problem One  risk for readmission  Role Documenting the Problem One  Care Management Telephonic Coordinator  Care Plan for Problem One  Active  THN Long Term Goal   Over the next 31 days, patient will not experience hospital readmission, as evidence by patient reporting and review of EMR during [facility] RN CCM outreach  [place] Term Goal Start Date  07/25/19  Interventions for Problem One Long Term Goal  RN CM reinforced plan of care with daughter [name], pt has all medications and taking as prescribed, verified home health continues working with pt, saw primary MD on 07/28/19  [facility] CM Short Term Goal #1   over the next 7 days patietn will compleet the hospital follow up appointment with primary care provider as evidence by reporting during follow up outreach call  [facility] CM Short Term Goal #1 Start Date  07/25/19  [facility] CM Short Term Goal #1 Met Date  08/03/19  Interventions for Short Term Goal #1  Pt did attend primary care MD appointment, was placed on diuretic and K+ to take once weekly    [facility] CM Care Plan Problem Two     Most Recent Value  Care Plan Problem Two  High risk for falls  Role Documenting the Problem Two  Care Management [place] for Problem Two  Active  THN CM Short Term Goal #1   Pt / family will utlize safety precautions and report no falls within 30 days  THN CM Short Term Goal #1 Start Date  07/27/19  Interventions for Short Term Goal #2   RN CM reinforced safety precautions, pt has had no falls since last conversation      PLAN Continue weekly  transition of care calls  Jacqlyn Larsen [facility], [place] Coordinator [phone]

## 2019-08-10 ENCOUNTER — Other Ambulatory Visit: Payer: Self-pay | Admitting: *Deleted

## 2019-08-10 NOTE — Patient Outreach (Signed)
Outreach call to patient's daughter Laura Parks for transition of care week 3, spoke with Laura Parks, HIPAA verified, Laura Parks reports she is in touch with her mother and brother daily and pt is doing well, she is weighing once weekly on Sunday and weight is 146 pounds,  CBG today 117 and "readings have been good"  Home health continues.  No new concerns voiced.  [facility] CM Care Plan Problem One     Most Recent Value  Care Plan Problem One  risk for readmission  Role Documenting the Problem One  Care Management Telephonic Coordinator  Care Plan for Problem One  Active  THN Long Term Goal   Over the next 31 days, patient will not experience hospital readmission, as evidence by patient reporting and review of EMR during [facility] RN CCM outreach  [place] Term Goal Start Date  07/25/19  Interventions for Problem One Long Term Goal  Per daughter Laura Parks, pt is weighing once weekly, has all medications and taking as prescribed, continues checking CBG, home health continues working with pt,  THN CM Short Term Goal #1   over the next 7 days patietn will compleet the hospital follow up appointment with primary care provider as evidence by reporting during follow up outreach call  [facility] CM Short Term Goal #1 Start Date  07/25/19  [facility] CM Short Term Goal #1 Met Date  08/03/19    [facility] CM Care Plan Problem Two     Most Recent Value  Care Plan Problem Two  High risk for falls  Role Documenting the Problem Two  Care Management Coordinator  Care Plan for Problem Two  Active  THN CM Short Term Goal #1   Pt / family will utlize safety precautions and report no falls within 30 days  THN CM Short Term Goal #1 Start Date  07/27/19  Interventions for Short Term Goal #2   RN CM reviewed safety precautions, pt has had no falls since last conversation.      PLAN Continue weekly transition of care calls  Jacqlyn Larsen [facility], [place] Coordinator [phone]

## 2019-08-18 ENCOUNTER — Other Ambulatory Visit: Payer: Self-pay | Admitting: *Deleted

## 2019-08-18 DIAGNOSIS — S72352D Displaced comminuted fracture of shaft of left femur, subsequent encounter for closed fracture with routine healing: Secondary | ICD-10-CM | POA: Diagnosis not present

## 2019-08-18 DIAGNOSIS — I69318 Other symptoms and signs involving cognitive functions following cerebral infarction: Secondary | ICD-10-CM | POA: Diagnosis not present

## 2019-08-18 DIAGNOSIS — F0150 Vascular dementia without behavioral disturbance: Secondary | ICD-10-CM | POA: Diagnosis not present

## 2019-08-18 DIAGNOSIS — W19XXXD Unspecified fall, subsequent encounter: Secondary | ICD-10-CM | POA: Diagnosis not present

## 2019-08-18 NOTE — Patient Outreach (Signed)
Outreach call to patient's daughter Laura Parks, HIPAA verified, Laura Parks reports pt is doing " very well"  Pt is able to get in and out of the shower by herself now, continues to work with home health RN and PT, weight 146 pounds, CBG 106, no falls reported, no new concerns voiced.  [facility] CM Care Plan Problem One     Most Recent Value  Care Plan Problem One  risk for readmission  Role Documenting the Problem One  Care Management Telephonic Coordinator  Care Plan for Problem One  Active  THN Long Term Goal   Over the next 31 days, patient will not experience hospital readmission, as evidence by patient reporting and review of EMR during [facility] RN CCM outreach  [name] Term Goal Start Date  07/25/19  Interventions for Problem One Long Term Goal  RN CM reinforced plan of care with patient's daughter Laura Parks, pt has all medicaiton and taking as prescribed, home health continues and this is going well,  pt continues to weigh once weekly and check CBG  THN CM Short Term Goal #1   over the next 7 days patietn will compleet the hospital follow up appointment with primary care provider as evidence by reporting during follow up outreach call  [facility] CM Short Term Goal #1 Start Date  07/25/19  [facility] CM Short Term Goal #1 Met Date  08/03/19    [facility] CM Care Plan Problem Two     Most Recent Value  Care Plan Problem Two  High risk for falls  Role Documenting the Problem Two  Care Management [place] for Problem Two  Active  THN CM Short Term Goal #1   Pt / family will utlize safety precautions and report no falls within 30 days  THN CM Short Term Goal #1 Start Date  07/27/19  Interventions for Short Term Goal #2   RN CM reinforced safety precautions, importance of working with PT, using DME      PLAN Outreach patient's daughter Laura Parks next month for telephone assessment  Laura Parks [facility], [place] Coordinator [phone]

## 2019-08-25 DIAGNOSIS — Z9181 History of falling: Secondary | ICD-10-CM | POA: Diagnosis not present

## 2019-08-25 DIAGNOSIS — W19XXXD Unspecified fall, subsequent encounter: Secondary | ICD-10-CM | POA: Diagnosis not present

## 2019-08-25 DIAGNOSIS — S72352D Displaced comminuted fracture of shaft of left femur, subsequent encounter for closed fracture with routine healing: Secondary | ICD-10-CM | POA: Diagnosis not present

## 2019-08-25 DIAGNOSIS — Z7984 Long term (current) use of oral hypoglycemic drugs: Secondary | ICD-10-CM | POA: Diagnosis not present

## 2019-08-25 DIAGNOSIS — I130 Hypertensive heart and chronic kidney disease with heart failure and stage 1 through stage 4 chronic kidney disease, or unspecified chronic kidney disease: Secondary | ICD-10-CM | POA: Diagnosis not present

## 2019-08-25 DIAGNOSIS — M810 Age-related osteoporosis without current pathological fracture: Secondary | ICD-10-CM | POA: Diagnosis not present

## 2019-08-25 DIAGNOSIS — N183 Chronic kidney disease, stage 3 unspecified: Secondary | ICD-10-CM | POA: Diagnosis not present

## 2019-08-25 DIAGNOSIS — F329 Major depressive disorder, single episode, unspecified: Secondary | ICD-10-CM | POA: Diagnosis not present

## 2019-08-25 DIAGNOSIS — Z7982 Long term (current) use of aspirin: Secondary | ICD-10-CM | POA: Diagnosis not present

## 2019-08-25 DIAGNOSIS — H3530 Unspecified macular degeneration: Secondary | ICD-10-CM | POA: Diagnosis not present

## 2019-08-25 DIAGNOSIS — I69318 Other symptoms and signs involving cognitive functions following cerebral infarction: Secondary | ICD-10-CM | POA: Diagnosis not present

## 2019-08-25 DIAGNOSIS — F0150 Vascular dementia without behavioral disturbance: Secondary | ICD-10-CM | POA: Diagnosis not present

## 2019-08-25 DIAGNOSIS — E1122 Type 2 diabetes mellitus with diabetic chronic kidney disease: Secondary | ICD-10-CM | POA: Diagnosis not present

## 2019-08-25 DIAGNOSIS — Z7901 Long term (current) use of anticoagulants: Secondary | ICD-10-CM | POA: Diagnosis not present

## 2019-08-25 DIAGNOSIS — I5032 Chronic diastolic (congestive) heart failure: Secondary | ICD-10-CM | POA: Diagnosis not present

## 2019-08-25 DIAGNOSIS — I2510 Atherosclerotic heart disease of native coronary artery without angina pectoris: Secondary | ICD-10-CM | POA: Diagnosis not present

## 2019-08-25 DIAGNOSIS — I480 Paroxysmal atrial fibrillation: Secondary | ICD-10-CM | POA: Diagnosis not present

## 2019-08-25 DIAGNOSIS — H548 Legal blindness, as defined in USA: Secondary | ICD-10-CM | POA: Diagnosis not present

## 2019-08-25 DIAGNOSIS — I6529 Occlusion and stenosis of unspecified carotid artery: Secondary | ICD-10-CM | POA: Diagnosis not present

## 2019-08-30 DIAGNOSIS — E7800 Pure hypercholesterolemia, unspecified: Secondary | ICD-10-CM | POA: Diagnosis not present

## 2019-08-30 DIAGNOSIS — E119 Type 2 diabetes mellitus without complications: Secondary | ICD-10-CM | POA: Diagnosis not present

## 2019-08-30 DIAGNOSIS — I10 Essential (primary) hypertension: Secondary | ICD-10-CM | POA: Diagnosis not present

## 2019-09-21 ENCOUNTER — Other Ambulatory Visit: Payer: Self-pay | Admitting: *Deleted

## 2019-09-21 ENCOUNTER — Encounter: Payer: Self-pay | Admitting: *Deleted

## 2019-09-21 NOTE — Patient Outreach (Signed)
Outreach call to patient's daughter [name] for telephone assessment, spoke with [name], HIPAA verified, reports pt doing well at home with adequate care, [name] is coming home this month to spend a month with pt.  Weight is 145 pounds, CBG ranges 95-100, no falls, pt is doing exercises that were prescribed by PT.  RN CM faxed case closure letter to primary MD, mailed case closure letter to pt home.  [facility] CM Care Plan Problem One     Most Recent Value  Care Plan Problem One  risk for readmission  Role Documenting the Problem One  Care Management Telephonic Coordinator  Care Plan for Problem One  Active  THN Long Term Goal   Over the next 31 days, patient will not experience hospital readmission, as evidence by patient reporting and review of EMR during [facility] RN CCM outreach  [facility] Long Term Goal Start Date  07/25/19  [facility] Long Term Goal Met Date  09/21/19  Interventions for Problem One Long Term Goal  RN CM reviewed plan of care/ discharge plan with daughter and will discharge pt today, RN health coach services offered and declined (for HF).  Pt has all medications and taking as prescribed, pt has had no falls since last conversation, continues to weigh at least once weekly and family checks CBG.  THN CM Short Term Goal #1   over the next 7 days patietn will compleet the hospital follow up appointment with primary care provider as evidence by reporting during follow up outreach call  [facility] CM Short Term Goal #1 Start Date  07/25/19  [facility] CM Short Term Goal #1 Met Date  08/03/19    [facility] CM Care Plan Problem Two     Most Recent Value  Care Plan Problem Two  High risk for falls  Role Documenting the Problem Two  Care Management [place] for Problem Two  Active  THN CM Short Term Goal #1   Pt / family will utlize safety precautions and report no falls within 30 days  THN CM Short Term Goal #1 Start Date  07/27/19  [facility] CM Short Term Goal #1 Met Date   09/21/19  Interventions for Short Term Goal  #2   RN CM reviewed safety precuations      PLAN Close case today  Jacqlyn Larsen [facility], BSN [place] Coordinator [phone]

## 2019-10-07 DIAGNOSIS — Z299 Encounter for prophylactic measures, unspecified: Secondary | ICD-10-CM | POA: Diagnosis not present

## 2019-10-07 DIAGNOSIS — Z713 Dietary counseling and surveillance: Secondary | ICD-10-CM | POA: Diagnosis not present

## 2019-10-07 DIAGNOSIS — I10 Essential (primary) hypertension: Secondary | ICD-10-CM | POA: Diagnosis not present

## 2019-10-07 DIAGNOSIS — F0390 Unspecified dementia without behavioral disturbance: Secondary | ICD-10-CM | POA: Diagnosis not present

## 2019-10-07 DIAGNOSIS — Z6826 Body mass index (BMI) 26.0-26.9, adult: Secondary | ICD-10-CM | POA: Diagnosis not present

## 2019-10-08 DIAGNOSIS — J9601 Acute respiratory failure with hypoxia: Secondary | ICD-10-CM | POA: Diagnosis not present

## 2019-10-08 DIAGNOSIS — I10 Essential (primary) hypertension: Secondary | ICD-10-CM | POA: Diagnosis not present

## 2019-10-08 DIAGNOSIS — E860 Dehydration: Secondary | ICD-10-CM | POA: Diagnosis not present

## 2019-10-08 DIAGNOSIS — N289 Disorder of kidney and ureter, unspecified: Secondary | ICD-10-CM | POA: Diagnosis not present

## 2019-10-08 DIAGNOSIS — R509 Fever, unspecified: Secondary | ICD-10-CM | POA: Diagnosis not present

## 2019-10-08 DIAGNOSIS — I959 Hypotension, unspecified: Secondary | ICD-10-CM | POA: Diagnosis not present

## 2019-10-08 DIAGNOSIS — R197 Diarrhea, unspecified: Secondary | ICD-10-CM | POA: Diagnosis not present

## 2019-10-08 DIAGNOSIS — U071 COVID-19: Secondary | ICD-10-CM | POA: Diagnosis not present

## 2019-10-08 DIAGNOSIS — R0902 Hypoxemia: Secondary | ICD-10-CM | POA: Diagnosis not present

## 2019-10-09 DIAGNOSIS — I4891 Unspecified atrial fibrillation: Secondary | ICD-10-CM | POA: Diagnosis not present

## 2019-10-09 DIAGNOSIS — R262 Difficulty in walking, not elsewhere classified: Secondary | ICD-10-CM | POA: Diagnosis not present

## 2019-10-09 DIAGNOSIS — N183 Chronic kidney disease, stage 3 unspecified: Secondary | ICD-10-CM | POA: Diagnosis present

## 2019-10-09 DIAGNOSIS — Z8673 Personal history of transient ischemic attack (TIA), and cerebral infarction without residual deficits: Secondary | ICD-10-CM | POA: Diagnosis not present

## 2019-10-09 DIAGNOSIS — F411 Generalized anxiety disorder: Secondary | ICD-10-CM | POA: Diagnosis not present

## 2019-10-09 DIAGNOSIS — J9601 Acute respiratory failure with hypoxia: Secondary | ICD-10-CM | POA: Diagnosis present

## 2019-10-09 DIAGNOSIS — I700 Atherosclerosis of aorta: Secondary | ICD-10-CM | POA: Diagnosis not present

## 2019-10-09 DIAGNOSIS — E860 Dehydration: Secondary | ICD-10-CM | POA: Diagnosis present

## 2019-10-09 DIAGNOSIS — I639 Cerebral infarction, unspecified: Secondary | ICD-10-CM | POA: Diagnosis not present

## 2019-10-09 DIAGNOSIS — I10 Essential (primary) hypertension: Secondary | ICD-10-CM | POA: Diagnosis not present

## 2019-10-09 DIAGNOSIS — E1165 Type 2 diabetes mellitus with hyperglycemia: Secondary | ICD-10-CM | POA: Diagnosis present

## 2019-10-09 DIAGNOSIS — N289 Disorder of kidney and ureter, unspecified: Secondary | ICD-10-CM | POA: Diagnosis not present

## 2019-10-09 DIAGNOSIS — E118 Type 2 diabetes mellitus with unspecified complications: Secondary | ICD-10-CM | POA: Diagnosis not present

## 2019-10-09 DIAGNOSIS — J1282 Pneumonia due to coronavirus disease 2019: Secondary | ICD-10-CM | POA: Diagnosis not present

## 2019-10-09 DIAGNOSIS — F331 Major depressive disorder, recurrent, moderate: Secondary | ICD-10-CM | POA: Diagnosis not present

## 2019-10-09 DIAGNOSIS — M810 Age-related osteoporosis without current pathological fracture: Secondary | ICD-10-CM | POA: Diagnosis not present

## 2019-10-09 DIAGNOSIS — I129 Hypertensive chronic kidney disease with stage 1 through stage 4 chronic kidney disease, or unspecified chronic kidney disease: Secondary | ICD-10-CM | POA: Diagnosis present

## 2019-10-09 DIAGNOSIS — D696 Thrombocytopenia, unspecified: Secondary | ICD-10-CM | POA: Diagnosis not present

## 2019-10-09 DIAGNOSIS — R509 Fever, unspecified: Secondary | ICD-10-CM | POA: Diagnosis not present

## 2019-10-09 DIAGNOSIS — E1122 Type 2 diabetes mellitus with diabetic chronic kidney disease: Secondary | ICD-10-CM | POA: Diagnosis present

## 2019-10-09 DIAGNOSIS — F0391 Unspecified dementia with behavioral disturbance: Secondary | ICD-10-CM | POA: Diagnosis not present

## 2019-10-09 DIAGNOSIS — I2510 Atherosclerotic heart disease of native coronary artery without angina pectoris: Secondary | ICD-10-CM | POA: Diagnosis not present

## 2019-10-09 DIAGNOSIS — R404 Transient alteration of awareness: Secondary | ICD-10-CM | POA: Diagnosis not present

## 2019-10-09 DIAGNOSIS — U071 COVID-19: Secondary | ICD-10-CM | POA: Diagnosis present

## 2019-10-09 DIAGNOSIS — R197 Diarrhea, unspecified: Secondary | ICD-10-CM | POA: Diagnosis not present

## 2019-10-09 DIAGNOSIS — I69391 Dysphagia following cerebral infarction: Secondary | ICD-10-CM | POA: Diagnosis not present

## 2019-10-09 DIAGNOSIS — I69328 Other speech and language deficits following cerebral infarction: Secondary | ICD-10-CM | POA: Diagnosis not present

## 2019-10-09 DIAGNOSIS — M6281 Muscle weakness (generalized): Secondary | ICD-10-CM | POA: Diagnosis not present

## 2019-10-09 DIAGNOSIS — Z743 Need for continuous supervision: Secondary | ICD-10-CM | POA: Diagnosis not present

## 2019-10-09 DIAGNOSIS — Z66 Do not resuscitate: Secondary | ICD-10-CM | POA: Diagnosis not present

## 2019-10-09 DIAGNOSIS — F0280 Dementia in other diseases classified elsewhere without behavioral disturbance: Secondary | ICD-10-CM | POA: Diagnosis not present

## 2019-10-18 DIAGNOSIS — I4891 Unspecified atrial fibrillation: Secondary | ICD-10-CM | POA: Diagnosis not present

## 2019-10-18 DIAGNOSIS — R404 Transient alteration of awareness: Secondary | ICD-10-CM | POA: Diagnosis not present

## 2019-10-18 DIAGNOSIS — U071 COVID-19: Secondary | ICD-10-CM | POA: Diagnosis not present

## 2019-10-18 DIAGNOSIS — R262 Difficulty in walking, not elsewhere classified: Secondary | ICD-10-CM | POA: Diagnosis not present

## 2019-10-18 DIAGNOSIS — F331 Major depressive disorder, recurrent, moderate: Secondary | ICD-10-CM | POA: Diagnosis not present

## 2019-10-18 DIAGNOSIS — F0280 Dementia in other diseases classified elsewhere without behavioral disturbance: Secondary | ICD-10-CM | POA: Diagnosis not present

## 2019-10-18 DIAGNOSIS — F411 Generalized anxiety disorder: Secondary | ICD-10-CM | POA: Diagnosis not present

## 2019-10-18 DIAGNOSIS — I2510 Atherosclerotic heart disease of native coronary artery without angina pectoris: Secondary | ICD-10-CM | POA: Diagnosis not present

## 2019-10-18 DIAGNOSIS — I69391 Dysphagia following cerebral infarction: Secondary | ICD-10-CM | POA: Diagnosis not present

## 2019-10-18 DIAGNOSIS — M6281 Muscle weakness (generalized): Secondary | ICD-10-CM | POA: Diagnosis not present

## 2019-10-18 DIAGNOSIS — E118 Type 2 diabetes mellitus with unspecified complications: Secondary | ICD-10-CM | POA: Diagnosis not present

## 2019-10-18 DIAGNOSIS — J9601 Acute respiratory failure with hypoxia: Secondary | ICD-10-CM | POA: Diagnosis not present

## 2019-10-18 DIAGNOSIS — J1282 Pneumonia due to coronavirus disease 2019: Secondary | ICD-10-CM | POA: Diagnosis not present

## 2019-10-18 DIAGNOSIS — N183 Chronic kidney disease, stage 3 unspecified: Secondary | ICD-10-CM | POA: Diagnosis not present

## 2019-10-18 DIAGNOSIS — Z743 Need for continuous supervision: Secondary | ICD-10-CM | POA: Diagnosis not present

## 2019-10-18 DIAGNOSIS — D696 Thrombocytopenia, unspecified: Secondary | ICD-10-CM | POA: Diagnosis not present

## 2019-10-18 DIAGNOSIS — F0391 Unspecified dementia with behavioral disturbance: Secondary | ICD-10-CM | POA: Diagnosis not present

## 2019-10-18 DIAGNOSIS — I10 Essential (primary) hypertension: Secondary | ICD-10-CM | POA: Diagnosis not present

## 2019-10-18 DIAGNOSIS — I69328 Other speech and language deficits following cerebral infarction: Secondary | ICD-10-CM | POA: Diagnosis not present

## 2019-10-18 DIAGNOSIS — I639 Cerebral infarction, unspecified: Secondary | ICD-10-CM | POA: Diagnosis not present

## 2019-10-18 DIAGNOSIS — M810 Age-related osteoporosis without current pathological fracture: Secondary | ICD-10-CM | POA: Diagnosis not present

## 2019-10-20 ENCOUNTER — Other Ambulatory Visit: Payer: Self-pay | Admitting: *Deleted

## 2019-10-20 DIAGNOSIS — I10 Essential (primary) hypertension: Secondary | ICD-10-CM | POA: Diagnosis not present

## 2019-10-20 DIAGNOSIS — N183 Chronic kidney disease, stage 3 unspecified: Secondary | ICD-10-CM | POA: Diagnosis not present

## 2019-10-20 DIAGNOSIS — E118 Type 2 diabetes mellitus with unspecified complications: Secondary | ICD-10-CM | POA: Diagnosis not present

## 2019-10-20 DIAGNOSIS — U071 COVID-19: Secondary | ICD-10-CM | POA: Diagnosis not present

## 2019-10-20 NOTE — Patient Outreach (Signed)
Screened for potential [facility] Care Management needs as a benefit of  [employer] Medicare.  Mrs. Yera is currently receiving skilled therapy at [facility].  Earlier Editor, commissioning attended telephonic interdisciplinary team meeting to assess for disposition needs and transition plan for resident.    Facility reports family is seeking potential ALF placement at SNF dc. However, LTC may be more appropriate. Transition plans pending progression. Member's husband is also a resident in [facility].  Will continue to follow for transition plans and [facility] needs.  Marthenia Rolling, MSN-Ed, RN,BSN [place] Acute Care Coordinator [phone] [facility]) [phone]  (Toll free office)

## 2019-10-28 ENCOUNTER — Other Ambulatory Visit: Payer: Self-pay | Admitting: *Deleted

## 2019-10-28 NOTE — Patient Outreach (Signed)
Late entry for 10/27/19  Screened for potential [facility] Care Management needs as a benefit of  [employer] Medicare.  Laura Parks is currently receiving skilled therapy at [facility].   Writer attended telephonic interdisciplinary team meeting to assess for disposition needs and transition plan for resident.   Facility dc planner reports transition plan will be long term care at [facility].    Marthenia Rolling, MSN-Ed, RN,BSN [place] Acute Care Coordinator [phone] [facility]) [phone]  (Toll free office)

## 2019-11-10 DIAGNOSIS — E118 Type 2 diabetes mellitus with unspecified complications: Secondary | ICD-10-CM | POA: Diagnosis not present

## 2019-11-10 DIAGNOSIS — D696 Thrombocytopenia, unspecified: Secondary | ICD-10-CM | POA: Diagnosis not present

## 2019-11-10 DIAGNOSIS — I10 Essential (primary) hypertension: Secondary | ICD-10-CM | POA: Diagnosis not present

## 2019-11-10 DIAGNOSIS — N183 Chronic kidney disease, stage 3 unspecified: Secondary | ICD-10-CM | POA: Diagnosis not present

## 2019-11-23 DIAGNOSIS — J9601 Acute respiratory failure with hypoxia: Secondary | ICD-10-CM | POA: Diagnosis not present

## 2019-11-23 DIAGNOSIS — F0280 Dementia in other diseases classified elsewhere without behavioral disturbance: Secondary | ICD-10-CM | POA: Diagnosis not present

## 2019-11-23 DIAGNOSIS — E118 Type 2 diabetes mellitus with unspecified complications: Secondary | ICD-10-CM | POA: Diagnosis not present

## 2019-11-23 DIAGNOSIS — U071 COVID-19: Secondary | ICD-10-CM | POA: Diagnosis not present

## 2019-11-23 DIAGNOSIS — I10 Essential (primary) hypertension: Secondary | ICD-10-CM | POA: Diagnosis not present

## 2019-12-26 DIAGNOSIS — F0280 Dementia in other diseases classified elsewhere without behavioral disturbance: Secondary | ICD-10-CM | POA: Diagnosis not present

## 2019-12-26 DIAGNOSIS — E118 Type 2 diabetes mellitus with unspecified complications: Secondary | ICD-10-CM | POA: Diagnosis not present

## 2019-12-26 DIAGNOSIS — I10 Essential (primary) hypertension: Secondary | ICD-10-CM | POA: Diagnosis not present

## 2019-12-26 DIAGNOSIS — J9601 Acute respiratory failure with hypoxia: Secondary | ICD-10-CM | POA: Diagnosis not present

## 2020-01-16 DIAGNOSIS — Z23 Encounter for immunization: Secondary | ICD-10-CM | POA: Diagnosis not present

## 2020-01-23 DIAGNOSIS — E118 Type 2 diabetes mellitus with unspecified complications: Secondary | ICD-10-CM | POA: Diagnosis not present

## 2020-01-23 DIAGNOSIS — J9601 Acute respiratory failure with hypoxia: Secondary | ICD-10-CM | POA: Diagnosis not present

## 2020-01-23 DIAGNOSIS — I10 Essential (primary) hypertension: Secondary | ICD-10-CM | POA: Diagnosis not present

## 2020-01-23 DIAGNOSIS — F0280 Dementia in other diseases classified elsewhere without behavioral disturbance: Secondary | ICD-10-CM | POA: Diagnosis not present

## 2020-01-30 DIAGNOSIS — E118 Type 2 diabetes mellitus with unspecified complications: Secondary | ICD-10-CM | POA: Diagnosis not present

## 2020-01-30 DIAGNOSIS — F331 Major depressive disorder, recurrent, moderate: Secondary | ICD-10-CM | POA: Diagnosis not present

## 2020-01-30 DIAGNOSIS — N183 Chronic kidney disease, stage 3 unspecified: Secondary | ICD-10-CM | POA: Diagnosis not present

## 2020-01-30 DIAGNOSIS — I10 Essential (primary) hypertension: Secondary | ICD-10-CM | POA: Diagnosis not present

## 2020-01-31 DIAGNOSIS — D649 Anemia, unspecified: Secondary | ICD-10-CM | POA: Diagnosis not present

## 2020-01-31 DIAGNOSIS — E119 Type 2 diabetes mellitus without complications: Secondary | ICD-10-CM | POA: Diagnosis not present

## 2020-02-14 DIAGNOSIS — Z23 Encounter for immunization: Secondary | ICD-10-CM | POA: Diagnosis not present

## 2020-02-20 ENCOUNTER — Encounter (HOSPITAL_COMMUNITY): Payer: Self-pay

## 2020-02-20 ENCOUNTER — Other Ambulatory Visit: Payer: Self-pay

## 2020-02-20 ENCOUNTER — Emergency Department (HOSPITAL_COMMUNITY): Payer: Medicare Other

## 2020-02-20 ENCOUNTER — Emergency Department (HOSPITAL_COMMUNITY)
Admission: EM | Admit: 2020-02-20 | Discharge: 2020-02-20 | Disposition: A | Discharge status: Home / Self Care | Payer: Medicare Other | Attending: Emergency Medicine | Admitting: Emergency Medicine

## 2020-02-20 DIAGNOSIS — E875 Hyperkalemia: Secondary | ICD-10-CM | POA: Diagnosis not present

## 2020-02-20 DIAGNOSIS — Z79899 Other long term (current) drug therapy: Secondary | ICD-10-CM | POA: Insufficient documentation

## 2020-02-20 DIAGNOSIS — F0390 Unspecified dementia without behavioral disturbance: Secondary | ICD-10-CM | POA: Insufficient documentation

## 2020-02-20 DIAGNOSIS — I480 Paroxysmal atrial fibrillation: Secondary | ICD-10-CM | POA: Insufficient documentation

## 2020-02-20 DIAGNOSIS — X58XXXA Exposure to other specified factors, initial encounter: Secondary | ICD-10-CM | POA: Diagnosis not present

## 2020-02-20 DIAGNOSIS — S79911A Unspecified injury of right hip, initial encounter: Secondary | ICD-10-CM | POA: Diagnosis present

## 2020-02-20 DIAGNOSIS — S32592A Other specified fracture of left pubis, initial encounter for closed fracture: Secondary | ICD-10-CM | POA: Insufficient documentation

## 2020-02-20 DIAGNOSIS — Z7901 Long term (current) use of anticoagulants: Secondary | ICD-10-CM | POA: Insufficient documentation

## 2020-02-20 DIAGNOSIS — Y92129 Unspecified place in nursing home as the place of occurrence of the external cause: Secondary | ICD-10-CM | POA: Diagnosis not present

## 2020-02-20 DIAGNOSIS — E860 Dehydration: Secondary | ICD-10-CM | POA: Insufficient documentation

## 2020-02-20 DIAGNOSIS — Y939 Activity, unspecified: Secondary | ICD-10-CM | POA: Insufficient documentation

## 2020-02-20 DIAGNOSIS — E119 Type 2 diabetes mellitus without complications: Secondary | ICD-10-CM | POA: Diagnosis not present

## 2020-02-20 DIAGNOSIS — I2510 Atherosclerotic heart disease of native coronary artery without angina pectoris: Secondary | ICD-10-CM | POA: Diagnosis not present

## 2020-02-20 DIAGNOSIS — I10 Essential (primary) hypertension: Secondary | ICD-10-CM | POA: Insufficient documentation

## 2020-02-20 DIAGNOSIS — Y999 Unspecified external cause status: Secondary | ICD-10-CM | POA: Diagnosis not present

## 2020-02-20 LAB — COMPREHENSIVE METABOLIC PANEL
ALT: 18 U/L (ref 0–44)
AST: 24 U/L (ref 15–41)
Albumin: 3 g/dL — ABNORMAL LOW (ref 3.5–5.0)
Alkaline Phosphatase: 81 U/L (ref 38–126)
Anion gap: 8 (ref 5–15)
BUN: 48 mg/dL — ABNORMAL HIGH (ref 8–23)
CO2: 26 mmol/L (ref 22–32)
Calcium: 8.8 mg/dL — ABNORMAL LOW (ref 8.9–10.3)
Chloride: 104 mmol/L (ref 98–111)
Creatinine, Ser: 2.48 mg/dL — ABNORMAL HIGH (ref 0.44–1.00)
GFR calc Af Amer: 20 mL/min — ABNORMAL LOW (ref >60)
GFR calc non Af Amer: 17 mL/min — ABNORMAL LOW (ref >60)
Glucose, Bld: 154 mg/dL — ABNORMAL HIGH (ref 70–99)
Potassium: 6.3 mmol/L (ref 3.5–5.1)
Sodium: 138 mmol/L (ref 135–145)
Total Bilirubin: 0.5 mg/dL (ref 0.3–1.2)
Total Protein: 5.7 g/dL — ABNORMAL LOW (ref 6.5–8.1)

## 2020-02-20 LAB — CBC
HCT: 28.9 % — ABNORMAL LOW (ref 36.0–46.0)
Hemoglobin: 8.6 g/dL — ABNORMAL LOW (ref 12.0–15.0)
MCH: 28.7 pg (ref 26.0–34.0)
MCHC: 29.8 g/dL — ABNORMAL LOW (ref 30.0–36.0)
MCV: 96.3 fL (ref 80.0–100.0)
Platelets: 190 10*3/uL (ref 150–400)
RBC: 3 MIL/uL — ABNORMAL LOW (ref 3.87–5.11)
RDW: 14.2 % (ref 11.5–15.5)
WBC: 7 10*3/uL (ref 4.0–10.5)
nRBC: 0 % (ref 0.0–0.2)

## 2020-02-20 LAB — SAMPLE TO BLOOD BANK

## 2020-02-20 MED ORDER — SODIUM CHLORIDE 0.9 % IV BOLUS: 1000.0000 mL | Freq: Once | INTRAVENOUS | Status: DC

## 2020-02-20 NOTE — ED Notes (Signed)
ptar called 

## 2020-02-20 NOTE — Discharge Instructions (Signed)
Have been you have been diagnosed with a fracture of the inferior pubic ramus, or one of the small bones in your pelvis.  It is normal for this type of injury to hurt for some time, but typically improves without surgical intervention.  Please monitor your condition carefully and return here for concerning changes, otherwise, follow-up with our orthopedic colleagues.  In addition, today's labs have demonstrated the worsening of your kidney function, and mild electrolyte abnormalities.  It is important to have repeat laboratory studies performed this week.  In the interim, stay well-hydrated, and monitor your condition as well.

## 2020-02-20 NOTE — ED Provider Notes (Signed)
[place] EMERGENCY DEPARTMENT Provider Note   CSN: [id number] Arrival date & time: 02/20/20  1441     History Chief Complaint  Patient presents with  . Hip Injury    Laura Parks is a 86 y.o. female.  HPI    Patient with a history of cognitive impairment as well as multiple other medical issues, now presents with concern of possible hip fracture. The patient self is awake and alert, cannot provide exact details of the fall, but it seems as though over the past days that she has had pain in her left proximal leg from a fall that occurred at some point in the last weeks. History is also obtained by nursing home transfer report, and it seems that the patient had outside films today demonstrating fracture in the left hip in the area of prior disruption. No report of the fall, though consistently it seems to occurred a few days ago. The patient herself is adamant about not staying in the hospital, stating that she needs to return home because she has family.  Though she acknowledges pain, states that it is preventing her from moving her left leg, she denies pain anywhere else, nausea, other complaints. It is unclear if she has been given any medication for relief, but as above it is worse with motion of the left leg.  Past Medical History:  Diagnosis Date  . Anemia   . Cognitive impairment   . Colon cancer ([place])   . Coronary artery disease    Minimal coronary atherosclerosis at cardiac catheterization April 2017  . Essential hypertension, benign   . History of pneumonia   . Iron deficiency anemia    Erosive antral gastritis  . Legal blindness   . Macular degeneration   . Mixed hyperlipidemia   . PAF (paroxysmal atrial fibrillation) ([place])   . Stroke [facility]) 01/2016   [place] infract  . Type 2 diabetes mellitus [facility])     Patient Active Problem List   Diagnosis Date Noted  . Closed left hip fracture ([place]) 06/09/2019  . Renal insufficiency 06/09/2019  .  Blood in the stool 06/18/2016  . Family hx of colon cancer 06/18/2016  . Absolute anemia 06/18/2016  . Embolic stroke ([place]) 25/36/6440  . Cerebrovascular accident (CVA) ([place])   . Cerebral infarction (hemorrhagic or thromboembolic)   . Acute delirium 12/31/2015  . Vascular dementia ([place]) 12/31/2015  . PAF (paroxysmal atrial fibrillation) ([place])   . Palliative care encounter   . Acute renal failure ([place])   . Altered mental status   . Stroke ([place])   . NSTEMI (non-ST elevated myocardial infarction) ([place])   . Hyperkalemia 12/27/2015  . CHF (congestive heart failure) ([place]) 12/27/2015  . Acute encephalopathy 12/27/2015  . TIA (transient ischemic attack) 12/27/2015  . AKI (acute kidney injury) ([place]) 12/27/2015  . Dehydration 12/27/2015  . Legal blindness 12/27/2015  . B12 deficiency 09/02/2014  . Gait abnormality 07/26/2014  . Delusions ([place]) 07/26/2014  . Depression 07/26/2014  . Cognitive impairment   . Type 2 diabetes mellitus ([place]) 02/02/2014  . Carotid artery occlusion 02/02/2014  . PVC's (premature ventricular contractions) 02/02/2014  . Mixed hyperlipidemia 02/23/2012  . Essential hypertension, benign 02/23/2012    Past Surgical History:  Procedure Laterality Date  . APPENDECTOMY    . CARDIAC CATHETERIZATION N/A 01/04/2016   Procedure: Left Heart Cath and Coronary Angiography;  Surgeon: Leonie Man, MD;  Location: [facility] CV LAB;  Service: Cardiovascular;  Laterality: N/A;  .  CATARACT EXTRACTION    . CHOLECYSTECTOMY    . Colon surgery for colon cancer     1992 by Dr Mendel Ryder  . COLONOSCOPY  03/24/2012   Procedure: COLONOSCOPY;  Surgeon: Rogene Houston, MD;  [facility];  Service: Endoscopy;  Laterality: N/A;  1200  . COLONOSCOPY N/A 07/03/2016   Procedure: COLONOSCOPY;  Surgeon: Rogene Houston, MD;  Location[facility];  Service: Endoscopy;  Laterality: N/A;  200  . ESOPHAGOGASTRODUODENOSCOPY  08/04/2012   Procedure: ESOPHAGOGASTRODUODENOSCOPY  (EGD);  Surgeon: Rogene Houston, MD;  Location: [facility];  Service: Endoscopy;  Laterality: N/A;  325  . INTRAMEDULLARY (IM) NAIL INTERTROCHANTERIC Left 06/09/2019   Procedure: INTRAMEDULLARY (IM) NAIL INTERTROCHANTRIC;  Surgeon: Shona Needles, MD;  Location: [facility];  Service: Orthopedics;  Laterality: Left;  . Right knee surgery    . TEE WITHOUT CARDIOVERSION N/A 02/01/2016   Procedure: TRANSESOPHAGEAL ECHOCARDIOGRAM (TEE);  Surgeon: Jerline Pain, MD;  Location: [facility] ENDOSCOPY;  Service: Cardiovascular;  Laterality: N/A;  . TUBAL LIGATION       OB History   No obstetric history on file.     Family History  Problem Relation Age of Onset  . COPD Mother   . Alzheimer's disease Mother   . Congestive Heart Failure Mother   . Diabetes Brother     Social History   Tobacco Use  . Smoking status: Never Smoker  . Smokeless tobacco: Never Used  Substance Use Topics  . Alcohol use: No    Alcohol/week: 0.0 standard drinks  . Drug use: No    Home Medications Prior to Admission medications   Medication Sig Start Date End Date Taking? Authorizing Provider  acetaminophen (TYLENOL) 325 MG tablet Take 2 tablets (650 mg total) by mouth every 6 (six) hours. 06/16/19   Bonnell Public, MD  alendronate (FOSAMAX) 70 MG tablet Take 70 mg by mouth every 7 (seven) days. Take with a full glass of water on an empty stomach. - on Fridays    [provider]  amLODipine (NORVASC) 5 MG tablet Take 1 tablet (5 mg total) by mouth daily. 12/08/16 03/08/17  Melvenia Beam, MD  apixaban (ELIQUIS) 5 MG TABS tablet Take 1 tablet (5 mg total) by mouth 2 (two) times daily. 06/16/19   Bonnell Public, MD  atorvastatin (LIPITOR) 10 MG tablet Take 1 tablet (10 mg total) by mouth daily at 6 PM. 06/16/19   Bonnell Public, MD  Cyanocobalamin (VITAMIN B-12) 2500 MCG SUBL Place 2,500 mcg under the tongue daily.     [provider]  diphenoxylate-atropine (LOMOTIL) 2.5-0.025 MG tablet Take 1  tablet by mouth 2 (two) times daily as needed. 10/06/19   [provider]  docusate sodium (COLACE) 100 MG capsule Take 1 capsule (100 mg total) by mouth 2 (two) times daily. 06/16/19   Dana Allan I, MD  ergocalciferol (VITAMIN D2) 50000 units capsule Take 50,000 Units by mouth once a week.    [provider]  ferrous sulfate 325 (65 FE) MG tablet Take 325 mg by mouth daily with breakfast.    [provider]  gabapentin (NEURONTIN) 100 MG capsule Take 1 capsule (100 mg total) by mouth 3 (three) times daily. Patient not taking: Reported on 07/27/2019 06/16/19   Dana Allan I, MD  lisinopril (ZESTRIL) 10 MG tablet Take 10 mg by mouth daily. 10/28/19   [provider]  methocarbamol (ROBAXIN) 500 MG tablet Take 1 tablet (500 mg  total) by mouth every 6 (six) hours as needed for muscle spasms. Patient not taking: Reported on 07/27/2019 06/16/19   Dana Allan I, MD  mirtazapine (REMERON) 15 MG tablet Take 15 mg by mouth at bedtime. 10/28/19   [provider]  pioglitazone (ACTOS) 15 MG tablet Take 15 mg by mouth daily. 01/10/16   [provider]  polyethylene glycol (MIRALAX / GLYCOLAX) 17 g packet Take 17 g by mouth daily as needed for mild constipation. Patient not taking: Reported on 07/27/2019 06/16/19   Dana Allan I, MD  pravastatin (PRAVACHOL) 40 MG tablet Take 40 mg by mouth daily. 10/28/19   [provider]    Allergies    Codeine  Review of Systems   Review of Systems  Unable to perform ROS: Other  Cognitive impairment  Physical Exam Updated Vital Signs BP (!) 100/41 (BP Location: Right Arm)   Pulse 81   Temp 98.8 F (37.1 C) (Oral)   Resp 18   Ht 5\' 2"  (1.575 m)   Wt 68 kg   SpO2 99%   BMI 27.42 kg/m   Physical Exam Vitals and nursing note reviewed.  Constitutional:      Appearance: She is well-developed. She is not ill-appearing or diaphoretic.  HENT:     Head: Normocephalic and atraumatic.    Eyes:     Conjunctiva/sclera: Conjunctivae normal.  Cardiovascular:     Rate and Rhythm: Normal rate and regular rhythm.  Pulmonary:     Effort: Pulmonary effort is normal. No respiratory distress.     Breath sounds: Normal breath sounds. No stridor.  Abdominal:     General: There is no distension.  Musculoskeletal:     Comments: Right lower extremity grossly unremarkable. Left knee nontender, patient will not flex the knee, as she states that it hurts in the left leg. Left ankle unremarkable, with range of motion, strength unremarkable as well. Tenderness palpation left proximal lateral thigh.  Skin:    General: Skin is warm and dry.  Neurological:     Mental Status: She is alert and oriented to person, place, and time.     Cranial Nerves: No cranial nerve deficit.     Motor: Atrophy present.  Psychiatric:        Mood and Affect: Mood is anxious. Affect is blunt.        Cognition and Memory: Cognition is impaired.     ED Results / Procedures / Treatments   Labs (all labs ordered are listed, but only abnormal results are displayed) Labs Reviewed  CBC - Abnormal; Notable for the following components:      Result Value   RBC 3.00 (*)    Hemoglobin 8.6 (*)    HCT 28.9 (*)    MCHC 29.8 (*)    All other components within normal limits  COMPREHENSIVE METABOLIC PANEL - Abnormal; Notable for the following components:   Potassium 6.3 (*)    Glucose, Bld 154 (*)    BUN 48 (*)    Creatinine, Ser 2.48 (*)    Calcium 8.8 (*)    Total Protein 5.7 (*)    Albumin 3.0 (*)    GFR calc non Af Amer 17 (*)    GFR calc Af Amer 20 (*)    All other components within normal limits  SAMPLE TO BLOOD BANK    EKG EKG Interpretation  Date/Time:  Monday February 20 2020 18:59:53 EDT Ventricular Rate:  49 PR Interval:    QRS Duration: 134  QT Interval:  535 QTC Calculation: 483 R Axis:   -106 Text Interpretation: Sinus or ectopic atrial bradycardia RBBB and LAFB No significant change since  last tracing Abnormal ECG Confirmed by Carmin Muskrat [phone]) on 02/20/2020 8:05:31 PM   Radiology DG Chest 2 View  Result Date: 02/20/2020 CLINICAL DATA:  Preoperative study. EXAM: CHEST - 2 VIEW COMPARISON:  Chest x-ray dated October 09, 2019. FINDINGS: Unchanged mild cardiomegaly with dense mitral annular calcifications. Atherosclerotic calcification of the aortic arch. Mild pulmonary vascular congestion. Linear atelectasis in the lingula. Small left pleural effusion. No focal consolidation or pneumothorax. No acute osseous abnormality. Old healed fracture of the left humeral neck. IMPRESSION: 1. Mild pulmonary vascular congestion and small left pleural effusion. Electronically Signed   By: Titus Dubin M.D.   On: 02/20/2020 16:05    Procedures Procedures (including critical care time)  Medications Ordered in ED Medications  sodium chloride 0.9 % bolus 1,000 mL (has no administration in time range)    ED Course  I have reviewed the triage vital signs and the nursing notes.  Pertinent labs & imaging results that were available during my care of the patient were reviewed by me and considered in my medical decision making (see chart for details).     Update: On repeat exam the patient remains confused, now accusing myself, and a colleague of not being physicians, stating that this is not a hospital. With some encouragement we were able to obtain portable x-ray.  Update: X-ray consistent with pubic rami fracture.  Attempted to contact the patient's daughter, unsuccessfully.  Update: I discussed the patient's case with the nurse practitioner from her nursing facility.  We discussed patient's presentation, baseline of confusion, which is persistent. Patient seemingly had a fall at some point last week, and after an initial x-ray was normal, 1 earlier today reportedly had demonstration of acetabular disruption.  We discussed the inconsistencies with findings of our x-ray tonight, and the  likelihood of inferior rami fracture given the patient's description of pain in her inguinal crease, hip, though with preserved ability to move it somewhat.  We discussed likelihood of nonsurgical management, as well as abnormal labs.  Nurse practitioner notes that most recent labs at the facility were notable for elevated BUN, creatinine, though today's are slightly worse, with associated mild hyperkalemia, there is no substantial change on EKG, no notable T wave abnormalities, and the patient has no complaints of chest pain, dyspnea or other concerns. I discussed admission here versus discharge with continued monitoring/management at the nursing facility, and the nurse practitioner suggest that this is appropriate, accept the patient back in transfer.  MDM Number of Diagnoses or Management Options Dehydration: new, needed workup Hyperkalemia: new, needed workup Inferior pubic ramus fracture, left, closed, initial encounter [facility]): new, needed workup   Amount and/or Complexity of Data Reviewed Clinical lab tests: reviewed Tests in the radiology section of CPT: reviewed Tests in the medicine section of CPT: reviewed Discussion of test results with the performing providers: yes Decide to obtain previous medical records or to obtain history from someone other than the patient: yes Obtain history from someone other than the patient: yes Review and summarize past medical records: yes Discuss the patient with other providers: yes Independent visualization of images, tracings, or specimens: yes  Risk of Complications, Morbidity, and/or Mortality Presenting problems: high Diagnostic procedures: high Management options: high  Critical Care Total time providing critical care: < 30 minutes  Patient Progress Patient progress: stable  Final Clinical Impression(s) / ED Diagnoses Final diagnoses:  Inferior pubic ramus fracture, left, closed, initial encounter ([place])  Dehydration    Hyperkalemia      Carmin Muskrat, MD 02/20/20 2006

## 2020-02-20 NOTE — ED Notes (Signed)
Attempted to start IV on pt. Pt refusing IV. Pt is confused saying she is not in a hospital, that she is supposed to be at [facility], not here. Pt will not believe this RN when told she is indeed at [facility]. MD notified.

## 2020-02-20 NOTE — ED Triage Notes (Signed)
Pt arrives [facility] from [facility] for eval of L acetabular fx found on XR from 6/7. Pt here for ortho eval. Hx of fx in same hip.

## 2020-02-20 NOTE — ED Notes (Addendum)
PT confused  And demanding to see Police . Police in room . Pt reports she needs to go to [facility] ED. Pt agitated.

## 2020-02-21 ENCOUNTER — Telehealth: Payer: Self-pay | Admitting: Orthopaedic Surgery

## 2020-02-21 ENCOUNTER — Telehealth: Payer: Self-pay | Admitting: Orthopedic Surgery

## 2020-02-21 NOTE — Telephone Encounter (Signed)
error 

## 2020-02-21 NOTE — Telephone Encounter (Signed)
Schedule everbody just dont double book   Start the add ons at 1140 for the morning then 1150   then 12   Afternoon [phone]

## 2020-02-21 NOTE — Telephone Encounter (Signed)
This patient was seen in the ED on 02/20/20 for acute fracture of the left inferior pubic ramus.  She is a resident at [facility] in [place].  She was advised to schedule an appointment in one week with you.  I spoke to patient's transportation.  They can bring her in this coming Friday, the 11th ,  Monday, the 14th or she can bring Ms. Lisa in on Friday the 18th.  Can you please advise regarding the scheduling?

## 2020-02-21 NOTE — Telephone Encounter (Signed)
Noted  

## 2020-02-23 DIAGNOSIS — R7989 Other specified abnormal findings of blood chemistry: Secondary | ICD-10-CM | POA: Diagnosis not present

## 2020-02-23 DIAGNOSIS — D649 Anemia, unspecified: Secondary | ICD-10-CM | POA: Diagnosis not present

## 2020-02-27 ENCOUNTER — Ambulatory Visit (INDEPENDENT_AMBULATORY_CARE_PROVIDER_SITE_OTHER): Payer: Medicare Other | Admitting: Orthopedic Surgery

## 2020-02-27 ENCOUNTER — Other Ambulatory Visit: Payer: Self-pay

## 2020-02-27 VITALS — BP 96/37 | HR 33 | Ht 62.0 in | Wt 139.0 lb

## 2020-02-27 DIAGNOSIS — D649 Anemia, unspecified: Secondary | ICD-10-CM | POA: Diagnosis not present

## 2020-02-27 DIAGNOSIS — S32592A Other specified fracture of left pubis, initial encounter for closed fracture: Secondary | ICD-10-CM | POA: Diagnosis not present

## 2020-02-27 DIAGNOSIS — R7989 Other specified abnormal findings of blood chemistry: Secondary | ICD-10-CM | POA: Diagnosis not present

## 2020-02-27 NOTE — Progress Notes (Signed)
Chief Complaint  Patient presents with  . Pelvic Pain    pelvic fracture ER visit 02/20/20     86 year old female fell at the nursing home injured her left side sustained a pelvic fracture left inferior pubic ramus best seen by comparing the x-ray taken around 7 June and one taking prior.  She has a previous long gamma type cephalic medullary nail done elsewhere  She has dementia she is here with a caregiver from the nursing home I believe and she has a history of diabetes she is on Eliquis she has other medical problems  Review of systems is deferred because of her dementia  Past Medical History:  Diagnosis Date  . Anemia   . Cognitive impairment   . Colon cancer ([place])   . Coronary artery disease    Minimal coronary atherosclerosis at cardiac catheterization April 2017  . Essential hypertension, benign   . History of pneumonia   . Iron deficiency anemia    Erosive antral gastritis  . Legal blindness   . Macular degeneration   . Mixed hyperlipidemia   . PAF (paroxysmal atrial fibrillation) ([place])   . Stroke [facility]) 01/2016   [place] infract  . Type 2 diabetes mellitus (HCC)     BP (!) 96/37   Pulse (!) 33   Ht 5\' 2"  (1.575 m)   Wt 139 lb (63 kg)   BMI 25.42 kg/m   She is awake she is alert she does respond to stimuli and she is hard of hearing  Her leg lengths look equal rotation looks normal she has painless range of motion of the right hip mild pain left hip touches her groin area consistent with her injury neurovascular exam is intact  X-ray from the hospital shows a nondisplaced inferior pubic ramus fracture again best seen by evaluating both the x-ray at the time of injury and one taken prior there is also a nail in the left proximal femur  These x-rays were independently reviewed  Encounter Diagnosis  Name Primary?  . Closed fracture of ramus of left pubis, initial encounter ([place]) Yes    X-ray in 6 weeks  Weight-bear as tolerated

## 2020-03-30 DIAGNOSIS — N183 Chronic kidney disease, stage 3 unspecified: Secondary | ICD-10-CM | POA: Diagnosis not present

## 2020-03-30 DIAGNOSIS — I10 Essential (primary) hypertension: Secondary | ICD-10-CM | POA: Diagnosis not present

## 2020-03-30 DIAGNOSIS — I4891 Unspecified atrial fibrillation: Secondary | ICD-10-CM | POA: Diagnosis not present

## 2020-03-30 DIAGNOSIS — E118 Type 2 diabetes mellitus with unspecified complications: Secondary | ICD-10-CM | POA: Diagnosis not present

## 2020-04-09 ENCOUNTER — Ambulatory Visit: Payer: Medicare Other | Admitting: Orthopedic Surgery

## 2020-04-09 DIAGNOSIS — S32592D Other specified fracture of left pubis, subsequent encounter for fracture with routine healing: Secondary | ICD-10-CM | POA: Insufficient documentation

## 2020-04-11 ENCOUNTER — Encounter: Payer: Self-pay | Admitting: Orthopedic Surgery

## 2020-04-11 ENCOUNTER — Ambulatory Visit: Payer: Medicare Other

## 2020-04-11 ENCOUNTER — Ambulatory Visit (INDEPENDENT_AMBULATORY_CARE_PROVIDER_SITE_OTHER): Payer: Medicare Other | Admitting: Orthopedic Surgery

## 2020-04-11 ENCOUNTER — Other Ambulatory Visit: Payer: Self-pay

## 2020-04-11 DIAGNOSIS — S32592D Other specified fracture of left pubis, subsequent encounter for fracture with routine healing: Secondary | ICD-10-CM

## 2020-04-11 NOTE — Progress Notes (Signed)
Chief Complaint  Patient presents with  . Hip Injury    follow up pelvis fracture/02/20/20  improving     Encounter Diagnosis  Name Primary?  . Closed fracture of ramus of left pubis with routine healing, subsequent encounter 02/20/2020 Yes    Patient is doing well after left pubic ramus fracture has no complaints x-ray shows fracture healing  Patient has been released

## 2020-06-08 IMAGING — DX DG HIP (WITH OR WITHOUT PELVIS) 2-3V*L*
2 series · 3 of 3 positions shown · non-contrast
Comparison: June 09, 2019

CLINICAL DATA: Status post fall.

EXAM:
DG HIP (WITH OR WITHOUT PELVIS) 2-3V LEFT

[pelvis]
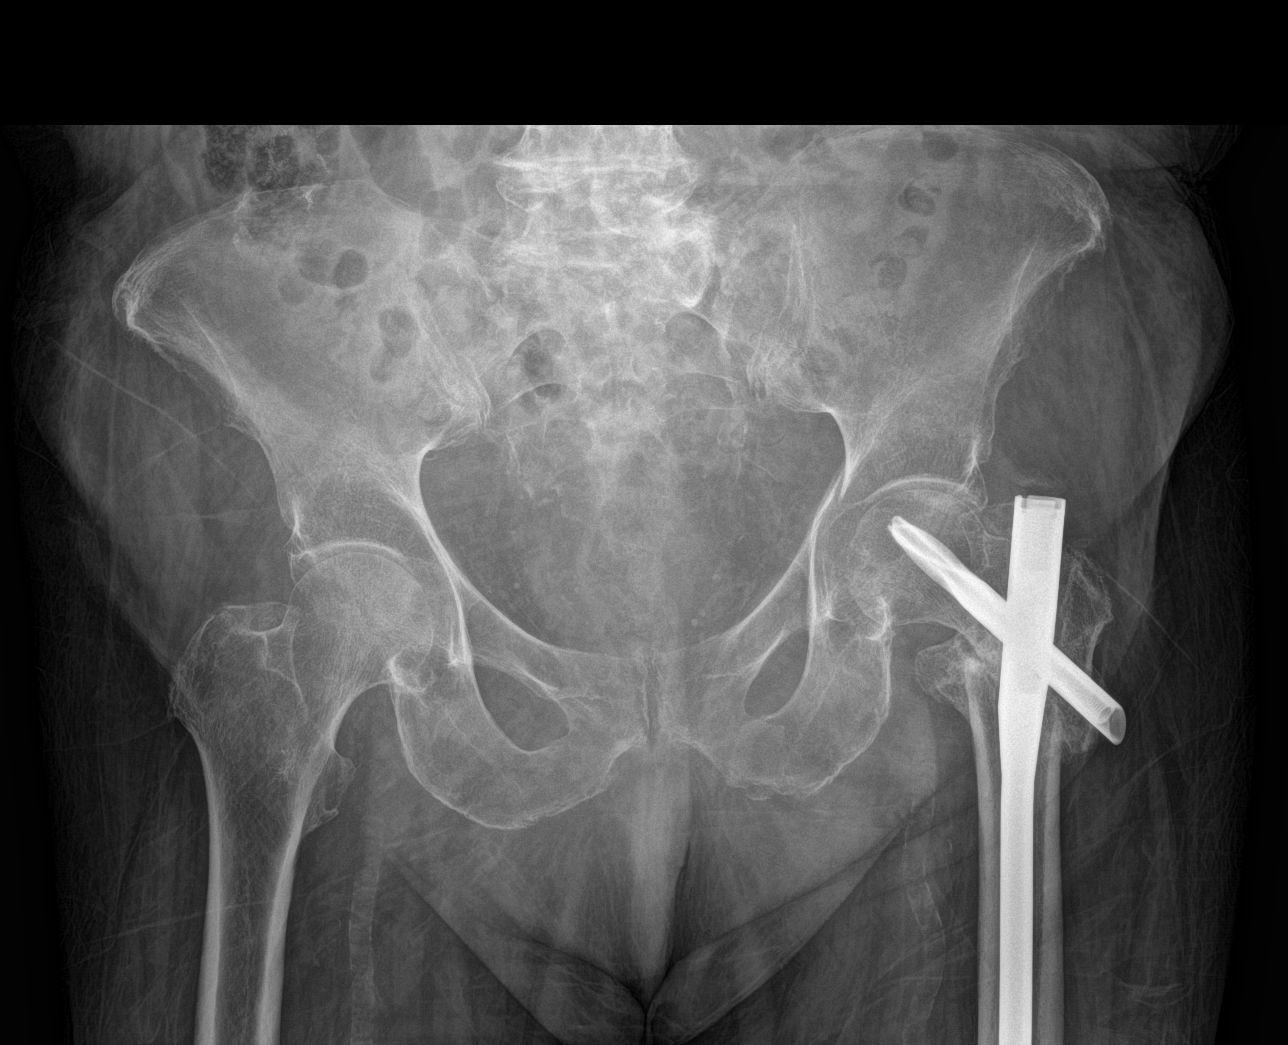

[Series 2: hip · 0.14mm/px · 2 of 2 slices shown]
[im 1/2]
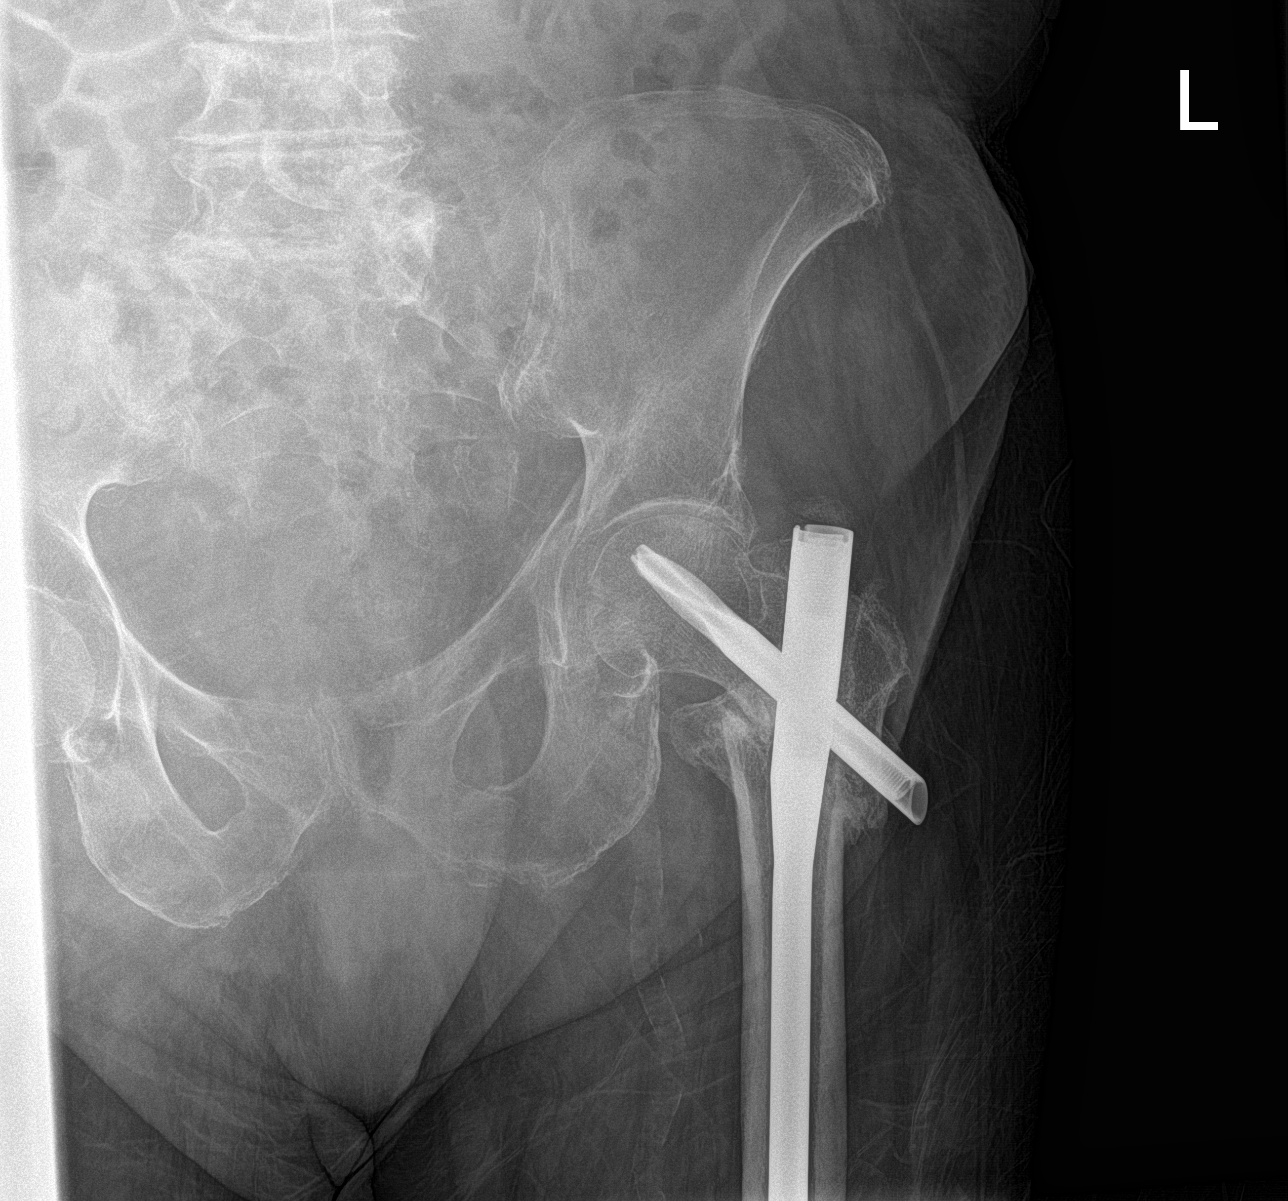
[im 2/2]
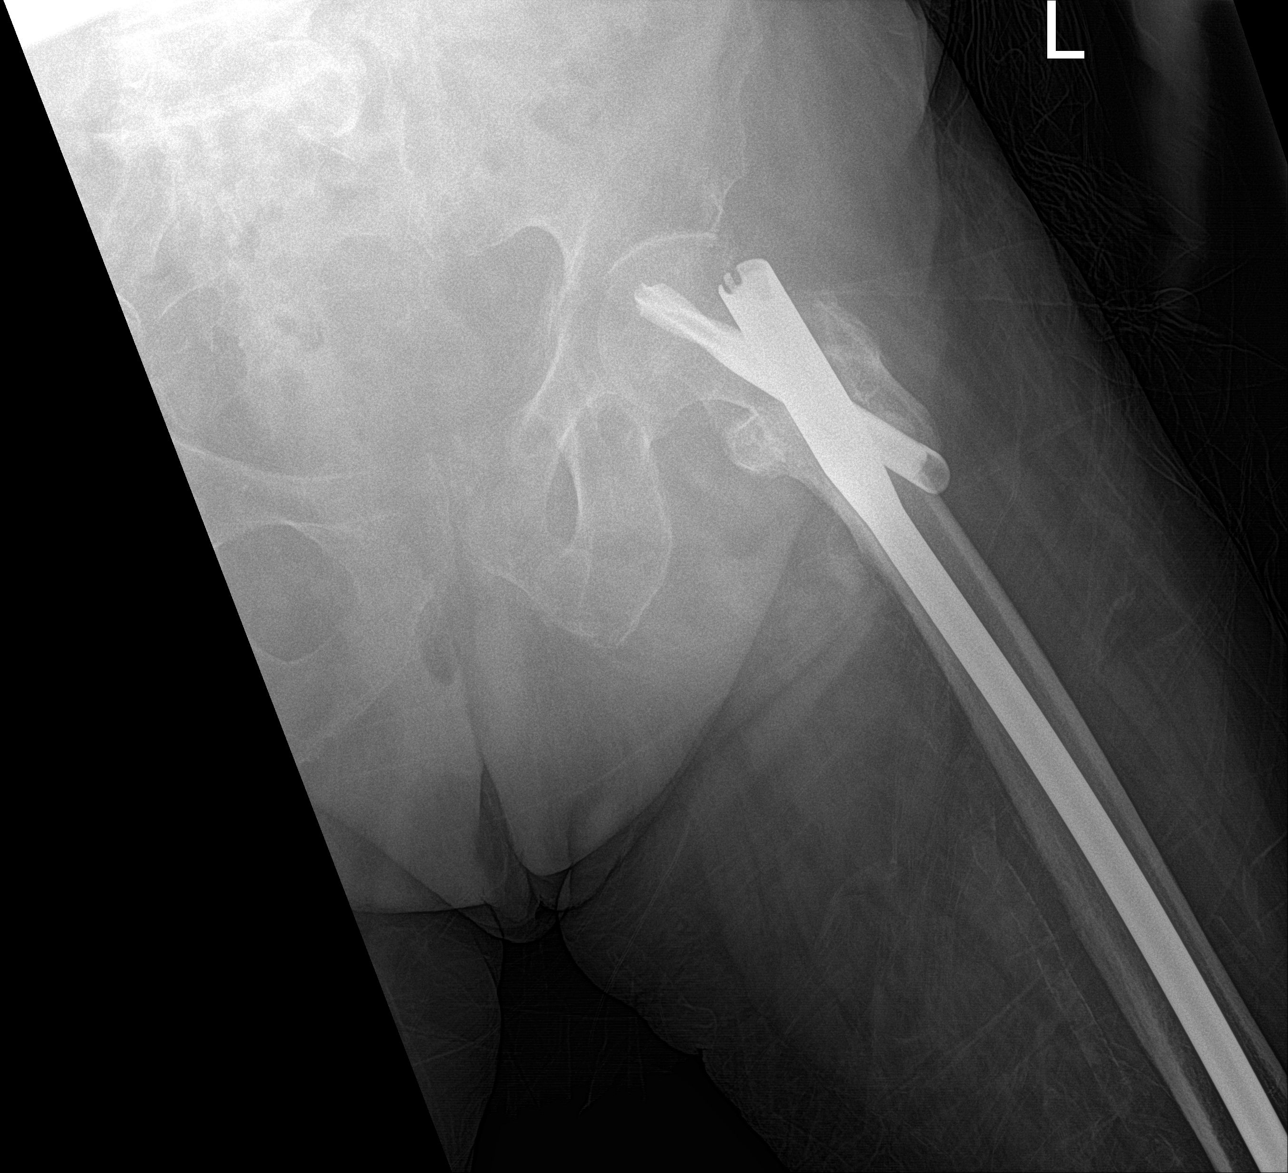

[3 of 3 positions shown; findings below may reference images not displayed]

FINDINGS: An ill-defined fracture deformity is seen involving the left
inferior pubic ramus. This represents a new finding when compared to
the prior study. There is no evidence of dislocation. A radiopaque
intramedullary rod and compression screw device are seen within the
proximal left femur. Mild to moderate severity degenerative changes
seen involving both hips.
IMPRESSION: Acute fracture of the left inferior pubic ramus.

## 2020-08-29 DIAGNOSIS — S60921A Unspecified superficial injury of right hand, initial encounter: Secondary | ICD-10-CM | POA: Diagnosis not present

## 2020-09-12 DIAGNOSIS — Z23 Encounter for immunization: Secondary | ICD-10-CM | POA: Diagnosis not present

## 2021-07-20 NOTE — Progress Notes (Signed)
[facility] [address], [place] [address]   Patient Care Team: Neale Burly, MD as PCP - General (Internal Medicine) Derek Jack, MD as Medical Oncologist (Medical Oncology) Brien Mates, RN as Oncology Nurse Navigator (Oncology)  CHIEF COMPLAINTS/PURPOSE OF CONSULTATION:  Newly diagnosed left breast cancer  HISTORY OF PRESENTING ILLNESS:  Laura Parks 87 y.o. female is here because of recent diagnosis of left breast angiosarcoma.   Today she reports feeling good, and she is accompanied by her sister-in-law. She denies weight loss, fevers, and night sweats. She reports a palpable knot in her left breast that is occasionally painful and warm to the touch. She has a history of colon cancer 40 years ago which was treated with surgery and chemotherapy, and she did not receive radiation at that time. She primarily moves via wheelchair, and she needs assistance to walk. She has a history of multiple CVA. She currently lives at [facility] with her husband and has lived there for a year. Prior to retirement she worked at [employer]. She denies smoking history. Her brother had colon cancer.  I reviewed her records extensively and collaborated the history with the patient.  SUMMARY OF ONCOLOGIC HISTORY: Oncology History   No history exists.   MEDICAL HISTORY:  Past Medical History:  Diagnosis Date   Anemia    Cognitive impairment    Colon cancer ([place])    Coronary artery disease    Minimal coronary atherosclerosis at cardiac catheterization April 2017   Essential hypertension, benign    History of pneumonia    Iron deficiency anemia    Erosive antral gastritis   Legal blindness    Macular degeneration    Mixed hyperlipidemia    PAF (paroxysmal atrial fibrillation) ([place])    Stroke ([place]) 01/2016   [place] infract   Type 2 diabetes mellitus ([place])     SURGICAL HISTORY: Past Surgical History:  Procedure Laterality  Date   APPENDECTOMY     CARDIAC CATHETERIZATION N/A 01/04/2016   Procedure: Left Heart Cath and Coronary Angiography;  Surgeon: Leonie Man, MD;  Location: [facility];  Service: Cardiovascular;  Laterality: N/A;   CATARACT EXTRACTION     CHOLECYSTECTOMY     Colon surgery for colon cancer     1992 by Dr Mendel Ryder   COLONOSCOPY  03/24/2012   Procedure: COLONOSCOPY;  Surgeon: Rogene Houston, MD;  [facility];  Service: Endoscopy;  Laterality: N/A;  1200   COLONOSCOPY N/A 07/03/2016   Procedure: COLONOSCOPY;  Surgeon: Rogene Houston, MD;  [facility];  Service: Endoscopy;  Laterality: N/A;  200   ESOPHAGOGASTRODUODENOSCOPY  08/04/2012   Procedure: ESOPHAGOGASTRODUODENOSCOPY (EGD);  Surgeon: Rogene Houston, MD;  [facility];  Service: Endoscopy;  Laterality: N/A;  325   INTRAMEDULLARY (IM) NAIL INTERTROCHANTERIC Left 06/09/2019   Procedure: INTRAMEDULLARY (IM) NAIL INTERTROCHANTRIC;  Surgeon: Shona Needles, MD;  Location: [facility];  Service: Orthopedics;  Laterality: Left;   Right knee surgery     TEE WITHOUT CARDIOVERSION N/A 02/01/2016   Procedure: TRANSESOPHAGEAL ECHOCARDIOGRAM (TEE);  Surgeon: Jerline Pain, MD;  Location: [facility];  Service: Cardiovascular;  Laterality: N/A;   TUBAL LIGATION      SOCIAL HISTORY: Social History   Socioeconomic History   Marital status: Married    Spouse name: Marchia Bond   Number of children: 3   Years of education: 11   Highest education  level: 11th grade  Occupational History   Occupation: Retired  Tobacco Use   Smoking status: Never   Smokeless tobacco: Never  Substance and Sexual Activity   Alcohol use: No    Alcohol/week: 0.0 standard drinks   Drug use: No   Sexual activity: Not on file  Other Topics Concern   Not on file  Social History Narrative   Lives at home w/ her husband and son   Right-handed   Caffeine: tea daily   Social Determinants of Health   Financial Resource Strain: Not  on file  Food Insecurity: Not on file  Transportation Needs: Not on file  Physical Activity: Not on file  Stress: Not on file  Social Connections: Not on file  Intimate Partner Violence: Not on file    FAMILY HISTORY: Family History  Problem Relation Age of Onset   COPD Mother    Alzheimer's disease Mother    Congestive Heart Failure Mother    Diabetes Brother     ALLERGIES:  is allergic to codeine.  MEDICATIONS:  Current Outpatient Medications  Medication Sig Dispense Refill   acetaminophen (TYLENOL) 325 MG tablet Take 2 tablets (650 mg total) by mouth every 6 (six) hours. 30 tablet 0   Alum & Mag Hydroxide-Simeth (MYLANTA PO) Take 5 mLs by mouth as needed.     Ascorbic Acid (VITAMIN C PO) Take by mouth.     ATIVAN 0.5 MG tablet Take 0.5 mg by mouth 2 (two) times daily as needed.     Calcium Carbonate-Vitamin D (OSCAL 500/200 D-3 PO) Take 1 tablet by mouth 2 (two) times daily.     citalopram (CELEXA) 20 MG tablet Take 20 mg by mouth daily.     diltiazem (CARDIZEM) 120 MG tablet Take 120 mg by mouth daily as needed.     docusate sodium (COLACE) 100 MG capsule Take 1 capsule (100 mg total) by mouth 2 (two) times daily. 10 capsule 0   donepezil (ARICEPT) 10 MG tablet Take 10 mg by mouth at bedtime.     doxycycline (VIBRA-TABS) 100 MG tablet Take 100 mg by mouth 2 (two) times daily.     ferrous sulfate 325 (65 FE) MG tablet Take 325 mg by mouth daily with breakfast.     furosemide (LASIX) 20 MG tablet Take 20 mg by mouth every other day.     melatonin 1 MG TABS tablet Take 6 mg by mouth at bedtime.     Multiple Vitamins-Minerals (OCUVITE PO) Take 1 tablet by mouth 2 (two) times daily.     polyethylene glycol (MIRALAX / GLYCOLAX) 17 g packet Take 17 g by mouth daily as needed for mild constipation. 14 each 0   XARELTO 10 MG TABS tablet Take 10 mg by mouth daily.     amLODipine (NORVASC) 5 MG tablet Take 1 tablet (5 mg total) by mouth daily. (Patient not taking: Reported on  02/27/2020) 90 tablet 4   No current facility-administered medications for this visit.    REVIEW OF SYSTEMS:   Review of Systems  Constitutional:  Negative for appetite change (60%), fatigue (70%), fever and unexpected weight change.  HENT:   Positive for lump/mass (L breast).   Gastrointestinal:  Positive for constipation.  Musculoskeletal:  Positive for arthralgias (7/10 L hip).  Psychiatric/Behavioral:  Positive for sleep disturbance. The patient is nervous/anxious.   All other systems reviewed and are negative.  PHYSICAL EXAMINATION: ECOG PERFORMANCE STATUS: 2 - Symptomatic, <50% confined to bed  Vitals:  07/22/21 0827  BP: 114/82  Pulse: 84  Resp: 16  Temp: (!) 97.2 F (36.2 C)  SpO2: 95%   Filed Weights   07/22/21 0827  Weight: 137 lb 9.6 oz (62.4 kg)   Physical Exam Vitals reviewed.  Constitutional:      Appearance: Normal appearance.     Comments: In wheelchair  Cardiovascular:     Rate and Rhythm: Normal rate and regular rhythm.     Pulses: Normal pulses.     Heart sounds: Normal heart sounds.  Pulmonary:     Effort: Pulmonary effort is normal.     Breath sounds: Normal breath sounds.  Chest:  Breasts:    Left: Skin change (Erythematous spot in LIQ with skin thickening and hyperpigmentation medially) present. No inverted nipple, nipple discharge or tenderness.  Neurological:     General: No focal deficit present.     Mental Status: She is alert and oriented to person, place, and time.  Psychiatric:        Mood and Affect: Mood normal.        Behavior: Behavior normal.    Breast Exam Chaperone: Thana Ates     LABORATORY DATA:  I have reviewed the data as listed Recent Results (from the past 2160 hour(s))  Folate     Status: None   Collection Time: 07/22/21  9:17 AM  Result Value Ref Range   Folate 10.2 >5.9 ng/mL    Comment: Performed at [facility], [address] [place], [place] [address]  Vitamin B12     Status: None   Collection Time:  07/22/21  9:17 AM  Result Value Ref Range   Vitamin B-12 251 180 - 914 pg/mL    Comment: (NOTE) This assay is not validated for testing neonatal or myeloproliferative syndrome specimens for Vitamin B12 levels. Performed at [facility], [address], [place], [place] [address]   Ferritin     Status: None   Collection Time: 07/22/21  9:17 AM  Result Value Ref Range   Ferritin 134 11 - 307 ng/mL    Comment: Performed at [facility], [address], [place], [place] [address]  Iron and TIBC     Status: None   Collection Time: 07/22/21  9:17 AM  Result Value Ref Range   Iron 74 28 - 170 ug/dL   TIBC 337 250 - 450 ug/dL   Saturation Ratios 22 10.4 - 31.8 %   UIBC 263 ug/dL    Comment: Performed at [facility], [address] [place], [place] [address]  Lactate dehydrogenase     Status: None   Collection Time: 07/22/21  9:17 AM  Result Value Ref Range   LDH 154 98 - 192 U/L    Comment: Performed at [facility], [address], [place], [place] [address]  Comprehensive metabolic panel     Status: Abnormal   Collection Time: 07/22/21  9:17 AM  Result Value Ref Range   Sodium 135 135 - 145 mmol/L   Potassium 4.2 3.5 - 5.1 mmol/L   Chloride 102 98 - 111 mmol/L   CO2 26 22 - 32 mmol/L   Glucose, Bld 125 (H) 70 - 99 mg/dL    Comment: Glucose reference range applies only to samples taken after fasting for at least 8 hours.   BUN 29 (H) 8 - 23 mg/dL   Creatinine, Ser 1.23 (H) 0.44 - 1.00 mg/dL   Calcium 9.0 8.9 - 10.3 mg/dL   Total Protein 6.7 6.5 -  8.1 g/dL   Albumin 3.8 3.5 - 5.0 g/dL   AST 20 15 - 41 U/L   ALT 20 0 - 44 U/L   Alkaline Phosphatase 85 38 - 126 U/L   Total Bilirubin 0.6 0.3 - 1.2 mg/dL   GFR, Estimated 42 (L) >60 mL/min    Comment: (NOTE) Calculated using the CKD-EPI Creatinine Equation (2021)    Anion gap 7 5 - 15    Comment: Performed at [facility], [address], [place], [place] [address]  CBC with Differential     Status: None   Collection Time: 07/22/21   9:17 AM  Result Value Ref Range   WBC 6.7 4.0 - 10.5 K/uL   RBC 4.11 3.87 - 5.11 MIL/uL   Hemoglobin 12.4 12.0 - 15.0 g/dL   HCT 38.0 36.0 - 46.0 %   MCV 92.5 80.0 - 100.0 fL   MCH 30.2 26.0 - 34.0 pg   MCHC 32.6 30.0 - 36.0 g/dL   RDW 13.5 11.5 - 15.5 %   Platelets 210 150 - 400 K/uL   nRBC 0.0 0.0 - 0.2 %   Neutrophils Relative % 57 %   Neutro Abs 3.8 1.7 - 7.7 K/uL   Lymphocytes Relative 30 %   Lymphs Abs 2.0 0.7 - 4.0 K/uL   Monocytes Relative 10 %   Monocytes Absolute 0.7 0.1 - 1.0 K/uL   Eosinophils Relative 3 %   Eosinophils Absolute 0.2 0.0 - 0.5 K/uL   Basophils Relative 0 %   Basophils Absolute 0.0 0.0 - 0.1 K/uL   Immature Granulocytes 0 %   Abs Immature Granulocytes 0.01 0.00 - 0.07 K/uL    Comment: Performed at [facility], [address], [place], [place] [address]    RADIOGRAPHIC STUDIES: I have personally reviewed the radiological reports and agreed with the findings in the report. No results found.   ASSESSMENT:  Left breast angiosarcoma: - She reported left breast skin lesion in the first week of September. - She was evaluated by Dr. Tarri Glenn in [place].  Left breast skin biopsy was consistent with angiosarcoma.  (Pathology report shows proliferation of atypical epithelioid to focally spindle cells in the dermis appearing to have areas suggestive of anastomosis and vascular channels.  These atypical epithelioid cells are concerning given the histologic features, scattered mitosis and endothelial appearance for angiosarcoma.  Cells are strongly positive with CD31, vascular/endothelial marker.  They are negative with CK7 and CK AE1/AE3.  This is consistent with angiosarcoma). - She denies any radiation to the left breast.  She does not have any weight loss or other B symptoms. - She has history of colon cancer 40 years ago underwent surgery and chemotherapy.  Social/family history: - She had several mini strokes and has difficulty walking.  She has been at [facility] in [place] for the past 1 year.  She uses wheelchair for ambulation.  Occasionally can walk with a walker. 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  Non-smoker. - Brother had colon cancer.   PLAN:  Left breast angiosarcoma: - We have were discussed pathology report with the patient and her sister-in-law in detail. - Recommend PET CT scan for staging. - If there is no evidence of metastatic disease, she will undergo surgical resection. - RTC after PET scan. - Patient's sister-in-law had concerns whether she can withstand surgery.  All her questions were answered to her satisfaction.  2.  Normocytic anemia: - We will check anemia labs including ferritin, B12,  folic acid, methylmalonic acid and copper levels.   Derek Jack, MD 07/22/21 6:12 PM  [place] [phone]   I, Thana Ates, am acting as a scribe for Dr. Derek Jack.  I, Derek Jack MD, have reviewed the above documentation for accuracy and completeness, and I agree with the above.

## 2021-07-22 ENCOUNTER — Inpatient Hospital Stay (HOSPITAL_COMMUNITY): Payer: Medicare Other

## 2021-07-22 ENCOUNTER — Other Ambulatory Visit: Payer: Self-pay

## 2021-07-22 ENCOUNTER — Inpatient Hospital Stay (HOSPITAL_COMMUNITY): Payer: Medicare Other | Attending: Hematology | Admitting: Hematology

## 2021-07-22 VITALS — BP 114/82 | HR 84 | Temp 97.2°F | Resp 16 | Ht 63.0 in | Wt 137.6 lb

## 2021-07-22 DIAGNOSIS — E538 Deficiency of other specified B group vitamins: Secondary | ICD-10-CM | POA: Insufficient documentation

## 2021-07-22 DIAGNOSIS — Z8249 Family history of ischemic heart disease and other diseases of the circulatory system: Secondary | ICD-10-CM | POA: Insufficient documentation

## 2021-07-22 DIAGNOSIS — G479 Sleep disorder, unspecified: Secondary | ICD-10-CM | POA: Diagnosis not present

## 2021-07-22 DIAGNOSIS — C50919 Malignant neoplasm of unspecified site of unspecified female breast: Secondary | ICD-10-CM

## 2021-07-22 DIAGNOSIS — Z885 Allergy status to narcotic agent status: Secondary | ICD-10-CM | POA: Diagnosis not present

## 2021-07-22 DIAGNOSIS — D649 Anemia, unspecified: Secondary | ICD-10-CM | POA: Diagnosis not present

## 2021-07-22 DIAGNOSIS — C44591 Other specified malignant neoplasm of skin of breast: Secondary | ICD-10-CM | POA: Diagnosis present

## 2021-07-22 DIAGNOSIS — C50912 Malignant neoplasm of unspecified site of left female breast: Secondary | ICD-10-CM | POA: Insufficient documentation

## 2021-07-22 DIAGNOSIS — M2550 Pain in unspecified joint: Secondary | ICD-10-CM | POA: Insufficient documentation

## 2021-07-22 DIAGNOSIS — Z800 Family history of malignant neoplasm of digestive organs: Secondary | ICD-10-CM | POA: Insufficient documentation

## 2021-07-22 DIAGNOSIS — Z818 Family history of other mental and behavioral disorders: Secondary | ICD-10-CM | POA: Insufficient documentation

## 2021-07-22 DIAGNOSIS — Z833 Family history of diabetes mellitus: Secondary | ICD-10-CM | POA: Insufficient documentation

## 2021-07-22 DIAGNOSIS — K5900 Constipation, unspecified: Secondary | ICD-10-CM | POA: Diagnosis not present

## 2021-07-22 DIAGNOSIS — Z836 Family history of other diseases of the respiratory system: Secondary | ICD-10-CM | POA: Diagnosis not present

## 2021-07-22 DIAGNOSIS — Z85038 Personal history of other malignant neoplasm of large intestine: Secondary | ICD-10-CM | POA: Insufficient documentation

## 2021-07-22 DIAGNOSIS — Z79899 Other long term (current) drug therapy: Secondary | ICD-10-CM | POA: Diagnosis not present

## 2021-07-22 DIAGNOSIS — Z7901 Long term (current) use of anticoagulants: Secondary | ICD-10-CM | POA: Diagnosis not present

## 2021-07-22 LAB — IRON AND TIBC
Iron: 74 ug/dL (ref 28–170)
Saturation Ratios: 22 % (ref 10.4–31.8)
TIBC: 337 ug/dL (ref 250–450)
UIBC: 263 ug/dL

## 2021-07-22 LAB — COMPREHENSIVE METABOLIC PANEL
ALT: 20 U/L (ref 0–44)
AST: 20 U/L (ref 15–41)
Albumin: 3.8 g/dL (ref 3.5–5.0)
Alkaline Phosphatase: 85 U/L (ref 38–126)
Anion gap: 7 (ref 5–15)
BUN: 29 mg/dL — ABNORMAL HIGH (ref 8–23)
CO2: 26 mmol/L (ref 22–32)
Calcium: 9 mg/dL (ref 8.9–10.3)
Chloride: 102 mmol/L (ref 98–111)
Creatinine, Ser: 1.23 mg/dL — ABNORMAL HIGH (ref 0.44–1.00)
GFR, Estimated: 42 mL/min — ABNORMAL LOW (ref >60)
Glucose, Bld: 125 mg/dL — ABNORMAL HIGH (ref 70–99)
Potassium: 4.2 mmol/L (ref 3.5–5.1)
Sodium: 135 mmol/L (ref 135–145)
Total Bilirubin: 0.6 mg/dL (ref 0.3–1.2)
Total Protein: 6.7 g/dL (ref 6.5–8.1)

## 2021-07-22 LAB — CBC WITH DIFFERENTIAL/PLATELET
Abs Immature Granulocytes: 0.01 10*3/uL (ref 0.00–0.07)
Basophils Absolute: 0 10*3/uL (ref 0.0–0.1)
Basophils Relative: 0 %
Eosinophils Absolute: 0.2 10*3/uL (ref 0.0–0.5)
Eosinophils Relative: 3 %
HCT: 38 % (ref 36.0–46.0)
Hemoglobin: 12.4 g/dL (ref 12.0–15.0)
Immature Granulocytes: 0 %
Lymphocytes Relative: 30 %
Lymphs Abs: 2 10*3/uL (ref 0.7–4.0)
MCH: 30.2 pg (ref 26.0–34.0)
MCHC: 32.6 g/dL (ref 30.0–36.0)
MCV: 92.5 fL (ref 80.0–100.0)
Monocytes Absolute: 0.7 10*3/uL (ref 0.1–1.0)
Monocytes Relative: 10 %
Neutro Abs: 3.8 10*3/uL (ref 1.7–7.7)
Neutrophils Relative %: 57 %
Platelets: 210 10*3/uL (ref 150–400)
RBC: 4.11 MIL/uL (ref 3.87–5.11)
RDW: 13.5 % (ref 11.5–15.5)
WBC: 6.7 10*3/uL (ref 4.0–10.5)
nRBC: 0 % (ref 0.0–0.2)

## 2021-07-22 LAB — FERRITIN: Ferritin: 134 ng/mL (ref 11–307)

## 2021-07-22 LAB — LACTATE DEHYDROGENASE: LDH: 154 U/L (ref 98–192)

## 2021-07-22 LAB — VITAMIN B12: Vitamin B-12: 251 pg/mL (ref 180–914)

## 2021-07-22 LAB — FOLATE: Folate: 10.2 ng/mL (ref >5.9)

## 2021-07-22 NOTE — Patient Instructions (Addendum)
[place] at [facility] Discharge Instructions  You were seen and examined today by Dr. Delton Coombes. Dr. Delton Coombes is a medical oncologist, meaning that he specializes in cancer diagnoses. Dr. Delton Coombes discussed your past medical history, family history of cancers, and the events that led to you being here today.  You have been diagnosed with Angiosarcoma of your breast. Dr. Delton Coombes has recommended a PET CT scan. This is a specialized CT scan that illuminates where there is cancer present in the body. This will confirm if there is any cancer spread outside of the breast. If it has not, the best course of treatment is surgery.  Dr. Delton Coombes has also recommended lab work today.  Follow-up as scheduled.   Thank you for choosing [place] at [facility] to provide your oncology and hematology care.  To afford each patient quality time with our provider, please arrive at least 15 minutes before your scheduled appointment time.   If you have a lab appointment with the [facility] please come in thru the Main Entrance and check in at the main information desk.  You need to re-schedule your appointment should you arrive 10 or more minutes late.  We strive to give you quality time with our providers, and arriving late affects you and other patients whose appointments are after yours.  Also, if you no show three or more times for appointments you may be dismissed from the clinic at the providers discretion.     Again, thank you for choosing [facility].  Our hope is that these requests will decrease the amount of time that you wait before being seen by our physicians.       _____________________________________________________________  Should you have questions after your visit to [facility], please contact our office at [phone] and follow the prompts.  Our office hours are 8:00 a.m. and 4:30 p.m. Monday - Friday.   Please note that voicemails left after 4:00 p.m. may not be returned until the following business day.  We are closed weekends and major holidays.  You do have access to a nurse 24-7, just call the main number to the clinic [phone] and do not press any options, hold on the line and a nurse will answer the phone.    For prescription refill requests, have your pharmacy contact our office and allow 72 hours.    Due to Covid, you will need to wear a mask upon entering the hospital. If you do not have a mask, a mask will be given to you at the Main Entrance upon arrival. For doctor visits, patients may have 1 support person age 31 or older with them. For treatment visits, patients can not have anyone with them due to social distancing guidelines and our immunocompromised population.

## 2021-07-23 ENCOUNTER — Encounter (HOSPITAL_COMMUNITY): Payer: Self-pay

## 2021-07-24 LAB — PROTEIN ELECTROPHORESIS, SERUM
A/G Ratio: 1.3 (ref 0.7–1.7)
Albumin ELP: 3.6 g/dL (ref 2.9–4.4)
Alpha-1-Globulin: 0.2 g/dL (ref 0.0–0.4)
Alpha-2-Globulin: 0.8 g/dL (ref 0.4–1.0)
Beta Globulin: 0.9 g/dL (ref 0.7–1.3)
Gamma Globulin: 0.7 g/dL (ref 0.4–1.8)
Globulin, Total: 2.7 g/dL (ref 2.2–3.9)
Total Protein ELP: 6.3 g/dL (ref 6.0–8.5)

## 2021-07-24 LAB — COPPER, SERUM: Copper: 107 ug/dL (ref 80–158)

## 2021-07-25 ENCOUNTER — Other Ambulatory Visit: Payer: Self-pay

## 2021-07-25 ENCOUNTER — Ambulatory Visit (HOSPITAL_COMMUNITY)
Admission: RE | Admit: 2021-07-25 | Discharge: 2021-07-25 | Disposition: A | Discharge status: Home / Self Care | Payer: Medicare Other | Source: Ambulatory Visit | Attending: Hematology | Admitting: Hematology

## 2021-07-25 DIAGNOSIS — C50919 Malignant neoplasm of unspecified site of unspecified female breast: Secondary | ICD-10-CM | POA: Insufficient documentation

## 2021-07-25 DIAGNOSIS — E538 Deficiency of other specified B group vitamins: Secondary | ICD-10-CM | POA: Insufficient documentation

## 2021-07-25 DIAGNOSIS — D649 Anemia, unspecified: Secondary | ICD-10-CM | POA: Insufficient documentation

## 2021-07-25 LAB — METHYLMALONIC ACID, SERUM: Methylmalonic Acid, Quantitative: 857 nmol/L — ABNORMAL HIGH (ref 0–378)

## 2021-07-30 ENCOUNTER — Ambulatory Visit (HOSPITAL_COMMUNITY): Payer: Medicare Other | Admitting: Hematology

## 2021-08-01 ENCOUNTER — Ambulatory Visit (HOSPITAL_COMMUNITY)
Admission: RE | Admit: 2021-08-01 | Discharge: 2021-08-01 | Disposition: A | Discharge status: Home / Self Care | Payer: Medicare Other | Source: Ambulatory Visit | Attending: Hematology | Admitting: Hematology

## 2021-08-01 ENCOUNTER — Other Ambulatory Visit: Payer: Self-pay

## 2021-08-01 DIAGNOSIS — D649 Anemia, unspecified: Secondary | ICD-10-CM | POA: Insufficient documentation

## 2021-08-01 DIAGNOSIS — E538 Deficiency of other specified B group vitamins: Secondary | ICD-10-CM | POA: Insufficient documentation

## 2021-08-01 DIAGNOSIS — C50919 Malignant neoplasm of unspecified site of unspecified female breast: Secondary | ICD-10-CM | POA: Insufficient documentation

## 2021-08-01 MED ORDER — FLUDEOXYGLUCOSE F - 18 (FDG) INJECTION
7.1630 | Freq: Once | INTRAVENOUS | Status: AC | PRN
  Administered 2021-08-01: 09:00:00 7.163 via INTRAVENOUS

## 2021-08-12 NOTE — Progress Notes (Signed)
[facility] [address], [place] [address]   Patient Care Team: Neale Burly, MD as PCP - General (Internal Medicine) Derek Jack, MD as Medical Oncologist (Medical Oncology) Brien Mates, RN as Oncology Nurse Navigator (Oncology)  SUMMARY OF ONCOLOGIC HISTORY: Oncology History   No history exists.    CHIEF COMPLIANT: Follow-up of left breast cancer   INTERVAL HISTORY: [name] is a 87 y.o. female here today for follow up of her left breast cancer. Her last visit was on 07/22/2021.   Today she reports feeling good, and she is accompanied by her son and her sister-in-law.   REVIEW OF SYSTEMS:   Review of Systems  Constitutional:  Negative for appetite change (75%) and fatigue (75%).  Gastrointestinal:  Positive for constipation.  Psychiatric/Behavioral:  The patient is nervous/anxious.   All other systems reviewed and are negative.  I have reviewed the past medical history, past surgical history, social history and family history with the patient and they are unchanged from previous note.   ALLERGIES:   is allergic to codeine.   MEDICATIONS:  Current Outpatient Medications  Medication Sig Dispense Refill   acetaminophen (TYLENOL) 325 MG tablet Take 2 tablets (650 mg total) by mouth every 6 (six) hours. 30 tablet 0   Alum & Mag Hydroxide-Simeth (MYLANTA PO) Take 5 mLs by mouth as needed.     amLODipine (NORVASC) 5 MG tablet Take 1 tablet (5 mg total) by mouth daily. (Patient not taking: Reported on 02/27/2020) 90 tablet 4   Ascorbic Acid (VITAMIN C PO) Take by mouth.     ATIVAN 0.5 MG tablet Take 0.5 mg by mouth 2 (two) times daily as needed.     Calcium Carbonate-Vitamin D (OSCAL 500/200 D-3 PO) Take 1 tablet by mouth 2 (two) times daily.     citalopram (CELEXA) 20 MG tablet Take 20 mg by mouth daily.     diltiazem (CARDIZEM) 120 MG tablet Take 120 mg by mouth daily as needed.     docusate sodium (COLACE) 100 MG capsule  Take 1 capsule (100 mg total) by mouth 2 (two) times daily. 10 capsule 0   donepezil (ARICEPT) 10 MG tablet Take 10 mg by mouth at bedtime.     doxycycline (VIBRA-TABS) 100 MG tablet Take 100 mg by mouth 2 (two) times daily.     ferrous sulfate 325 (65 FE) MG tablet Take 325 mg by mouth daily with breakfast.     furosemide (LASIX) 20 MG tablet Take 20 mg by mouth every other day.     melatonin 1 MG TABS tablet Take 6 mg by mouth at bedtime.     Multiple Vitamins-Minerals (OCUVITE PO) Take 1 tablet by mouth 2 (two) times daily.     polyethylene glycol (MIRALAX / GLYCOLAX) 17 g packet Take 17 g by mouth daily as needed for mild constipation. 14 each 0   XARELTO 10 MG TABS tablet Take 10 mg by mouth daily.     No current facility-administered medications for this visit.     PHYSICAL EXAMINATION: Performance status (ECOG): 2 - Symptomatic, <50% confined to bed  There were no vitals filed for this visit. Wt Readings from Last 3 Encounters:  07/22/21 137 lb 9.6 oz (62.4 kg)  02/27/20 139 lb (63 kg)  02/20/20 149 lb 14.6 oz (68 kg)   Physical Exam Vitals reviewed.  Constitutional:      Appearance: Normal appearance.     Comments: In wheelchair  Cardiovascular:     Rate and Rhythm: Normal rate and regular rhythm.     Pulses: Normal pulses.     Heart sounds: Normal heart sounds.  Pulmonary:     Effort: Pulmonary effort is normal.     Breath sounds: Normal breath sounds.  Neurological:     General: No focal deficit present.     Mental Status: She is alert and oriented to person, place, and time.  Psychiatric:        Mood and Affect: Mood normal.        Behavior: Behavior normal.    Breast Exam Chaperone: Thana Ates     LABORATORY DATA:  I have reviewed the data as listed CMP Latest Ref Rng & Units 07/22/2021 02/20/2020 06/15/2019  Glucose 70 - 99 mg/dL 125(H) 154(H) 135(H)  BUN 8 - 23 mg/dL 29(H) 48(H) 36(H)  Creatinine 0.44 - 1.00 mg/dL 1.23(H) 2.48(H) 1.36(H)  Sodium 135 -  145 mmol/L 135 138 138  Potassium 3.5 - 5.1 mmol/L 4.2 6.3(HH) 4.7  Chloride 98 - 111 mmol/L 102 104 102  CO2 22 - 32 mmol/L 26 26 28   Calcium 8.9 - 10.3 mg/dL 9.0 8.8(L) 9.1  Total Protein 6.5 - 8.1 g/dL 6.7 5.7(L) -  Total Bilirubin 0.3 - 1.2 mg/dL 0.6 0.5 -  Alkaline Phos 38 - 126 U/L 85 81 -  AST 15 - 41 U/L 20 24 -  ALT 0 - 44 U/L 20 18 -   No results found for: XBD532 Lab Results  Component Value Date   WBC 6.7 07/22/2021   HGB 12.4 07/22/2021   HCT 38.0 07/22/2021   MCV 92.5 07/22/2021   PLT 210 07/22/2021   NEUTROABS 3.8 07/22/2021    ASSESSMENT:  Left breast angiosarcoma: - She reported left breast skin lesion in the first week of September. - She was evaluated by Dr. Tarri Glenn in [place].  Left breast skin biopsy was consistent with angiosarcoma.  (Pathology report shows proliferation of atypical epithelioid to focally spindle cells in the dermis appearing to have areas suggestive of anastomosis and vascular channels.  These atypical epithelioid cells are concerning given the histologic features, scattered mitosis and endothelial appearance for angiosarcoma.  Cells are strongly positive with CD31, vascular/endothelial marker.  They are negative with CK7 and CK AE1/AE3.  This is consistent with angiosarcoma). - She denies any radiation to the left breast.  She does not have any weight loss or other B symptoms. - She has history of colon cancer 40 years ago underwent surgery and chemotherapy.   Social/family history: - She had several mini strokes and has difficulty walking.  She has been at [facility] in [place] for the past 1 year.  She uses wheelchair for ambulation.  Occasionally can walk with a walker. 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  Non-smoker. - Brother had colon cancer.   PLAN:  Left breast angiosarcoma (T1 N0 M0): - We reviewed PET scan images from 08/02/2021 which showed small 1.4 cm hypermetabolic nodule in the subcutaneous tissues of the upper inner  quadrant of the left breast with skin thickening with SUV 5.6.  No definite evidence of regional or distant metastatic disease. - Subcentimeter pulmonary nodules predominantly in the right lower lung, largest measuring 7 mm.  I have compared it with the CT chest from 2017.  Likely benign nodules. - We had prolonged discussion about further management especially in the setting of dementia. - Only definitive management is mastectomy. - Recommend evaluation  by surgery and proceed with surgery if she is a candidate. - RTC after surgery.  2.  Normocytic anemia: - Labs reviewed by me showed hemoglobin improved to 12.4.  However methylmalonic acid is elevated at 857 with borderline B12 of 251 indicating B12 deficiency. - We will give her B12 injection today.  She was told to start B12 1 mg tablet daily.  Breast Cancer therapy associated bone loss: I have recommended calcium, Vitamin D and weight bearing exercises.  Orders placed this encounter:  No orders of the defined types were placed in this encounter.   The patient has a good understanding of the overall plan. She agrees with it. She will call with any problems that may develop before the next visit here.  Derek Jack, MD [place] [phone]   I, Thana Ates, am acting as a scribe for Dr. Derek Jack.  I, Derek Jack MD, have reviewed the above documentation for accuracy and completeness, and I agree with the above.

## 2021-08-13 ENCOUNTER — Other Ambulatory Visit: Payer: Self-pay

## 2021-08-13 ENCOUNTER — Inpatient Hospital Stay (HOSPITAL_COMMUNITY): Payer: Medicare Other

## 2021-08-13 ENCOUNTER — Inpatient Hospital Stay (HOSPITAL_BASED_OUTPATIENT_CLINIC_OR_DEPARTMENT_OTHER): Payer: Medicare Other | Admitting: Hematology

## 2021-08-13 VITALS — BP 108/55 | HR 51 | Temp 96.8°F | Resp 16 | Wt 150.0 lb

## 2021-08-13 DIAGNOSIS — D649 Anemia, unspecified: Secondary | ICD-10-CM

## 2021-08-13 DIAGNOSIS — C50919 Malignant neoplasm of unspecified site of unspecified female breast: Secondary | ICD-10-CM

## 2021-08-13 DIAGNOSIS — C44591 Other specified malignant neoplasm of skin of breast: Secondary | ICD-10-CM | POA: Diagnosis not present

## 2021-08-13 DIAGNOSIS — E538 Deficiency of other specified B group vitamins: Secondary | ICD-10-CM | POA: Diagnosis not present

## 2021-08-13 DIAGNOSIS — C50912 Malignant neoplasm of unspecified site of left female breast: Secondary | ICD-10-CM | POA: Diagnosis not present

## 2021-08-13 MED ORDER — CYANOCOBALAMIN 1000 MCG/ML IJ SOLN
1000.0000 ug | Freq: Once | INTRAMUSCULAR | Status: AC
  Administered 2021-08-13: 1000 ug via INTRAMUSCULAR

## 2021-08-13 NOTE — Patient Instructions (Signed)
[place] [facility] Discharge Instructions  You were seen today by Dr. Delton Coombes. Dr. Delton Coombes reviewed your recent PET scan. The PET scan revealed no spread of cancer outside of the breast. The standard of care for this is surgery. Dr. Delton Coombes can place a referral for you to see a surgeon to see if you are a candidate for surgical removal.  Start taking Vitamin B12 supplements daily.  Follow-up as scheduled.   Thank you for choosing [place] at [facility] to provide your oncology and hematology care.  To afford each patient quality time with our provider, please arrive at least 15 minutes before your scheduled appointment time.   If you have a lab appointment with the  Farm please come in thru the Main Entrance and check in at the main information desk.  You need to re-schedule your appointment should you arrive 10 or more minutes late.  We strive to give you quality time with our providers, and arriving late affects you and other patients whose appointments are after yours.  Also, if you no show three or more times for appointments you may be dismissed from the clinic at the providers discretion.     Again, thank you for choosing [facility].  Our hope is that these requests will decrease the amount of time that you wait before being seen by our physicians.       _____________________________________________________________  Should you have questions after your visit to [facility], please contact our office at [phone] and follow the prompts.  Our office hours are 8:00 a.m. and 4:30 p.m. Monday - Friday.  Please note that voicemails left after 4:00 p.m. may not be returned until the following business day.  We are closed weekends and major holidays.  You do have access to a nurse 24-7, just call the main number to the clinic [phone] and do not press any options, hold on the line and a nurse  will answer the phone.    For prescription refill requests, have your pharmacy contact our office and allow 72 hours.    Due to Covid, you will need to wear a mask upon entering the hospital. If you do not have a mask, a mask will be given to you at the Main Entrance upon arrival. For doctor visits, patients may have 1 support person age 46 or older with them. For treatment visits, patients can not have anyone with them due to social distancing guidelines and our immunocompromised population.

## 2021-08-13 NOTE — Progress Notes (Signed)
Patient tolerated vitamin B12 injection with no complaints voiced.  Site clean and dry with no bruising or swelling noted.  No complaints of pain.  Discharged with vital signs stable and no signs or symptoms of distress noted.

## 2021-08-13 NOTE — Patient Instructions (Signed)
[facility]  Discharge Instructions: Thank you for choosing [facility] to provide your oncology and hematology care.  If you have a lab appointment with the Cancer Center, please come in thru the Main Entrance and check in at the main information desk.  Wear comfortable clothing and clothing appropriate for easy access to any Portacath or PICC line.   We strive to give you quality time with your provider. You may need to reschedule your appointment if you arrive late (15 or more minutes).  Arriving late affects you and other patients whose appointments are after yours.  Also, if you miss three or more appointments without notifying the office, you may be dismissed from the clinic at the provider's discretion.      For prescription refill requests, have your pharmacy contact our office and allow 72 hours for refills to be completed.        To help prevent nausea and vomiting after your treatment, we encourage you to take your nausea medication as directed.  BELOW ARE SYMPTOMS THAT SHOULD BE REPORTED IMMEDIATELY: *FEVER GREATER THAN 100.4 F (38 C) OR HIGHER *CHILLS OR SWEATING *NAUSEA AND VOMITING THAT IS NOT CONTROLLED WITH YOUR NAUSEA MEDICATION *UNUSUAL SHORTNESS OF BREATH *UNUSUAL BRUISING OR BLEEDING *URINARY PROBLEMS (pain or burning when urinating, or frequent urination) *BOWEL PROBLEMS (unusual diarrhea, constipation, pain near the anus) TENDERNESS IN MOUTH AND THROAT WITH OR WITHOUT PRESENCE OF ULCERS (sore throat, sores in mouth, or a toothache) UNUSUAL RASH, SWELLING OR PAIN  UNUSUAL VAGINAL DISCHARGE OR ITCHING   Items with * indicate a potential emergency and should be followed up as soon as possible or go to the Emergency Department if any problems should occur.  Please show the CHEMOTHERAPY ALERT CARD or IMMUNOTHERAPY ALERT CARD at check-in to the Emergency Department and triage nurse.  Should you have questions after your visit or need to cancel  or reschedule your appointment, please contact  CANCER CENTER [phone]  and follow the prompts.  Office hours are 8:00 a.m. to 4:30 p.m. Monday - Friday. Please note that voicemails left after 4:00 p.m. may not be returned until the following business day.  We are closed weekends and major holidays. You have access to a nurse at all times for urgent questions. Please call the main number to the clinic [phone] and follow the prompts.  For any non-urgent questions, you may also contact your provider using MyChart. We now offer e-Visits for anyone 18 and older to request care online for non-urgent symptoms. For details visit [email or web address].   Also download the MyChart app! Go to the app store, search "MyChart", open the app, select [facility], and log in with your MyChart username and password.  Due to Covid, a mask is required upon entering the hospital/clinic. If you do not have a mask, one will be given to you upon arrival. For doctor visits, patients may have 1 support person aged 18 or older with them. For treatment visits, patients cannot have anyone with them due to current Covid guidelines and our immunocompromised population.  

## 2021-08-27 ENCOUNTER — Other Ambulatory Visit: Payer: Self-pay

## 2021-08-27 ENCOUNTER — Encounter: Payer: Self-pay | Admitting: General Surgery

## 2021-08-27 ENCOUNTER — Ambulatory Visit (INDEPENDENT_AMBULATORY_CARE_PROVIDER_SITE_OTHER): Payer: Medicare Other | Admitting: General Surgery

## 2021-08-27 VITALS — BP 143/76 | HR 80 | Temp 98.3°F | Resp 12 | Ht 63.0 in | Wt 150.0 lb

## 2021-08-27 DIAGNOSIS — C50312 Malignant neoplasm of lower-inner quadrant of left female breast: Secondary | ICD-10-CM

## 2021-08-28 NOTE — Progress Notes (Signed)
Laura Parks; [id number]; 10/04/1929   HPI Patient is an 87 year old white female who was found to have a lesion on her left breast.  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  Patient has been referred to my care by Dr. Delton Coombes of oncology for a mastectomy.  Patient does suffer from slight dementia.  Her son and daughter are her powers of attorney.  She denies any family history of breast cancer.  She denies any radiation therapy to the breast.  She currently resides in a nursing home.  She is on Xarelto for atrial fibrillation. Past Medical History:  Diagnosis Date   Anemia    Cognitive impairment    Colon cancer ([place])    Coronary artery disease    Minimal coronary atherosclerosis at cardiac catheterization April 2017   Essential hypertension, benign    History of pneumonia    Iron deficiency anemia    Erosive antral gastritis   Legal blindness    Macular degeneration    Mixed hyperlipidemia    PAF (paroxysmal atrial fibrillation) ([place])    Stroke ([place]) 01/2016   [place] infract   Type 2 diabetes mellitus ([place])     Past Surgical History:  Procedure Laterality Date   APPENDECTOMY     CARDIAC CATHETERIZATION N/A 01/04/2016   Procedure: Left Heart Cath and Coronary Angiography;  Surgeon: Leonie Man, MD;  Location: [facility];  Service: Cardiovascular;  Laterality: N/A;   CATARACT EXTRACTION     CHOLECYSTECTOMY     Colon surgery for colon cancer     1992 by Dr Mendel Ryder   COLONOSCOPY  03/24/2012   Procedure: COLONOSCOPY;  Surgeon: Rogene Houston, MD;  [facility];  Service: Endoscopy;  Laterality: N/A;  1200   COLONOSCOPY N/A 07/03/2016   Procedure: COLONOSCOPY;  Surgeon: Rogene Houston, MD;  [facility];  Service: Endoscopy;  Laterality: N/A;  200   ESOPHAGOGASTRODUODENOSCOPY  08/04/2012   Procedure: ESOPHAGOGASTRODUODENOSCOPY (EGD);  Surgeon: Rogene Houston, MD;  [facility];  Service:  Endoscopy;  Laterality: N/A;  325   INTRAMEDULLARY (IM) NAIL INTERTROCHANTERIC Left 06/09/2019   Procedure: INTRAMEDULLARY (IM) NAIL INTERTROCHANTRIC;  Surgeon: Shona Needles, MD;  Location: [facility];  Service: Orthopedics;  Laterality: Left;   Right knee surgery     TEE WITHOUT CARDIOVERSION N/A 02/01/2016   Procedure: TRANSESOPHAGEAL ECHOCARDIOGRAM (TEE);  Surgeon: Jerline Pain, MD;  Location: [facility];  Service: Cardiovascular;  Laterality: N/A;   TUBAL LIGATION      Family History  Problem Relation Age of Onset   COPD Mother    Alzheimer's disease Mother    Congestive Heart Failure Mother    Diabetes Brother     Current Outpatient Medications on File Prior to Visit  Medication Sig Dispense Refill   acetaminophen (TYLENOL) 325 MG tablet Take 2 tablets (650 mg total) by mouth every 6 (six) hours. 30 tablet 0   Alum & Mag Hydroxide-Simeth (MYLANTA PO) Take 5 mLs by mouth as needed.     Ascorbic Acid (VITAMIN C PO) Take by mouth.     ATIVAN 0.5 MG tablet Take 0.5 mg by mouth 2 (two) times daily as needed.     Calcium Carbonate-Vitamin D (OSCAL 500/200 D-3 PO) Take 1 tablet by mouth 2 (two) times daily.     citalopram (CELEXA) 20 MG tablet Take 20 mg by mouth daily.     cyanocobalamin 1000 MCG  tablet Take 1,000 mcg by mouth daily.     diltiazem (CARDIZEM) 120 MG tablet Take 120 mg by mouth daily as needed.     docusate sodium (COLACE) 100 MG capsule Take 1 capsule (100 mg total) by mouth 2 (two) times daily. 10 capsule 0   donepezil (ARICEPT) 10 MG tablet Take 10 mg by mouth at bedtime.     doxycycline (VIBRA-TABS) 100 MG tablet Take 100 mg by mouth 2 (two) times daily.     ferrous sulfate 325 (65 FE) MG tablet Take 325 mg by mouth daily with breakfast.     furosemide (LASIX) 20 MG tablet Take 20 mg by mouth every other day.     melatonin 1 MG TABS tablet Take 6 mg by mouth at bedtime.     Multiple Vitamins-Minerals (OCUVITE PO) Take 1 tablet by mouth 2 (two) times daily.      polyethylene glycol (MIRALAX / GLYCOLAX) 17 g packet Take 17 g by mouth daily as needed for mild constipation. 14 each 0   XARELTO 10 MG TABS tablet Take 10 mg by mouth daily.     amLODipine (NORVASC) 5 MG tablet Take 1 tablet (5 mg total) by mouth daily. (Patient not taking: Reported on 02/27/2020) 90 tablet 4   No current facility-administered medications on file prior to visit.    Allergies  Allergen Reactions   Codeine Nausea Only    Social History   Substance and Sexual Activity  Alcohol Use No   Alcohol/week: 0.0 standard drinks    Social History   Tobacco Use  Smoking Status Never  Smokeless Tobacco Never    Review of Systems  Unable to perform ROS: Dementia   Objective   Vitals:   08/27/21 1338  BP: (!) 143/76  Pulse: 80  Resp: 12  Temp: 98.3 F (36.8 C)    Physical Exam Vitals reviewed. Exam conducted with a chaperone present.  Constitutional:      Appearance: Normal appearance. She is not ill-appearing.     Comments: Sitting in a wheelchair  HENT:     Head: Normocephalic and atraumatic.  Cardiovascular:     Rate and Rhythm: Normal rate. Rhythm irregular.     Heart sounds: Normal heart sounds. No murmur heard.   No friction rub. No gallop.  Pulmonary:     Effort: Pulmonary effort is normal. No respiratory distress.     Breath sounds: Normal breath sounds. No stridor. No wheezing, rhonchi or rales.  Musculoskeletal:     Cervical back: Normal range of motion and neck supple.  Lymphadenopathy:     Cervical: No cervical adenopathy.  Skin:    General: Skin is warm and dry.  Neurological:     Mental Status: She is alert.  Breast: Increased density with skin changes that are petechial in nature along the medial aspect of the right breast in the oval manner, 9 o'clock position.  No distinct deeper mass noted.  Axilla is negative for palpable nodes.  Right breast examination unremarkable.  Lymph nodes negative.    Media Information Document  Information  Photos    08/27/2021 13:58  Attached To:  Office Visit on 08/27/21 with Aviva Signs, MD   Source Information  Aviva Signs, MD   [facility]   Oncology, pathology notes reviewed Assessment  Angiosarcoma of left breast Multiple comorbidities including chronic anticoagulation with Xarelto for atrial fibrillation, dementia Plan  Patient ultimately needs a left simple mastectomy.  We will get cardiology clearance for anesthesia.  This was discussed with the patient's son.  Further management is pending cardiology evaluation.

## 2021-10-02 ENCOUNTER — Ambulatory Visit (INDEPENDENT_AMBULATORY_CARE_PROVIDER_SITE_OTHER): Payer: Medicare Other | Admitting: Cardiology

## 2021-10-02 ENCOUNTER — Encounter: Payer: Self-pay | Admitting: Cardiology

## 2021-10-02 ENCOUNTER — Telehealth: Payer: Self-pay | Admitting: *Deleted

## 2021-10-02 ENCOUNTER — Encounter: Payer: Self-pay | Admitting: *Deleted

## 2021-10-02 VITALS — BP 100/78 | HR 71 | Ht 63.0 in | Wt 146.6 lb

## 2021-10-02 DIAGNOSIS — Z01810 Encounter for preprocedural cardiovascular examination: Secondary | ICD-10-CM

## 2021-10-02 DIAGNOSIS — I4821 Permanent atrial fibrillation: Secondary | ICD-10-CM

## 2021-10-02 MED ORDER — RIVAROXABAN 15 MG PO TABS: 15.0000 mg | ORAL_TABLET | Freq: Every day | ORAL | 6 refills | Status: DC

## 2021-10-02 NOTE — Telephone Encounter (Signed)
Patient's nurse Lezlie Lye LPN informed and verbalized understanding of plan. Also faxed to [facility] [phone]

## 2021-10-02 NOTE — Telephone Encounter (Signed)
-----   Message from Satira Sark, MD sent at 10/02/2021 11:57 AM EST ----- Regarding: RE: Xarelto question from Dr. Domenic Polite Thank you both.  Appreciate the input and I agree that holding without bridge would make the most sense.  Thank you for checking on dose, I just saw her today for the first time in 6 years.  Xarelto should be 15 mg daily for stroke prophylaxis with atrial fibrillation. ----- Message ----- From: Merlene Laughter, RN Sent: 10/02/2021  11:23 AM EST To: Satira Sark, MD Subject: FW: Xarelto question from Dr. [facility] Contacted-spoke with Maryalyce Sanjuan [place] (nurse) who confirmed xarelto is 10 mg daily Prescribed by Dr. Caprice Renshaw (facility provider)  ----- Message ----- From: Leeroy Bock, RPH-CPP Sent: 10/02/2021  11:16 AM EST To: Merlene Laughter, RN Subject: RE: Xarelto question from Dr. Domenic Polite         Given her advanced age, reduced renal function (CrCl 32), dementia/blindness, and short request hold, would favor holding Xarelto for 2 days without bridge. Would clarify her Xarelto dose though, she currently has 10mg  on her med list which is not a dose approved for afib (she'd qualify for 15mg  daily based on her renal function).  Thanks, Jinny Blossom ----- Message ----- From: Merlene Laughter, RN Sent: 10/02/2021  11:12 AM EST To: [facility] Pharmd Subject: Xarelto question from Dr. Domenic Polite             Per [facility]--Can you please check with our Pharm.D. in [place] regarding recommended protocol for interrupting Xarelto prior to a mastectomy in a patient with a CHA2DS2-VASc score of 8.  Bear in mind that she is elderly, age 88, functionally limited.  Do they recommend any bridging as we would only be holding it for 48 hours.

## 2021-10-02 NOTE — Patient Instructions (Addendum)

## 2021-10-02 NOTE — Progress Notes (Signed)
Cardiology Office Note  Date: 10/02/2021   ID: Laura Parks, DOB 1930-02-02, MRN [id number]  PCP:  Neale Burly, MD  Cardiologist:  Rozann Lesches, MD Electrophysiologist:  None   Chief Complaint  Patient presents with   Preoperative cardiac evaluation    History of Present Illness: Laura Parks is an 88 y.o. female referred for cardiology consultation by Dr. Arnoldo Morale for preoperative cardiac evaluation with plan for left simple mastectomy under general anesthesia in the setting of breast cancer.  She was last seen in 2017 with cardiac history including minimal coronary atherosclerosis and paroxysmal atrial fibrillation.  She currently resides in the [facility].  She is here today with her son who is consistently involved in her care.  She has some degree of dementia at baseline, was able to ask and answer questions today.  She is fairly functionally limited, is able to ambulate to some degree with assistance, does not describe any limiting breathlessness or angina with low-level activity.  No palpitations or syncope.  She has had evaluation by Dr. Delton Coombes for left breast angiosarcoma.  Mastectomy has been recommended as primary treatment.  RCRI perioperative cardiac risk index is class III, 6.6% chance of major adverse cardiac event, overall intermediate risk.  This does not take into account her advanced age.  She would also need to interrupt anticoagulation temporarily which would have an increased risk of stroke.  None of these issues however would be an absolute contraindication for her to proceed with surgery however.  Last assessment of LVEF was back in 2017 at which point it was normal at 60 to 65%.  She does have mild foot and ankle edema at times.  No orthopnea or PND.  Past Medical History:  Diagnosis Date   Anemia    Cognitive impairment    Colon cancer ([place])    Coronary artery disease    Minimal coronary atherosclerosis at cardiac catheterization April 2017    Essential hypertension, benign    History of pneumonia    Iron deficiency anemia    Erosive antral gastritis   Legal blindness    Macular degeneration    Mixed hyperlipidemia    PAF (paroxysmal atrial fibrillation) ([place])    Stroke ([place]) 01/2016   [place] infract   Type 2 diabetes mellitus ([place])     Past Surgical History:  Procedure Laterality Date   APPENDECTOMY     CARDIAC CATHETERIZATION N/A 01/04/2016   Procedure: Left Heart Cath and Coronary Angiography;  Surgeon: Leonie Man, MD;  Location: [facility];  Service: Cardiovascular;  Laterality: N/A;   CATARACT EXTRACTION     CHOLECYSTECTOMY     Colon surgery for colon cancer     1992 by Dr Mendel Ryder   COLONOSCOPY  03/24/2012   Procedure: COLONOSCOPY;  Surgeon: Rogene Houston, MD;  [facility];  Service: Endoscopy;  Laterality: N/A;  1200   COLONOSCOPY N/A 07/03/2016   Procedure: COLONOSCOPY;  Surgeon: Rogene Houston, MD;  [facility]: [facility];  Service: Endoscopy;  Laterality: N/A;  200   ESOPHAGOGASTRODUODENOSCOPY  08/04/2012   Procedure: ESOPHAGOGASTRODUODENOSCOPY (EGD);  Surgeon: Rogene Houston, MD;  [facility];  Service: Endoscopy;  Laterality: N/A;  325   INTRAMEDULLARY (IM) NAIL INTERTROCHANTERIC Left 06/09/2019   Procedure: INTRAMEDULLARY (IM) NAIL INTERTROCHANTRIC;  Surgeon: Shona Needles, MD;  Location: [facility];  Service: Orthopedics;  Laterality: Left;   Right knee surgery     TEE WITHOUT CARDIOVERSION  N/A 02/01/2016   Procedure: TRANSESOPHAGEAL ECHOCARDIOGRAM (TEE);  Surgeon: Jerline Pain, MD;  Location: [facility] END[facility];  Service: Cardiovascular;  Laterality: N/A;   TUBAL LIGATION      Current Outpatient Medications  Medication Sig Dispense Refill   acetaminophen (TYLENOL) 325 MG tablet Take 2 tablets (650 mg total) by mouth every 6 (six) hours. 30 tablet 0   Alum & Mag Hydroxide-Simeth (MYLANTA PO) Take 5 mLs by mouth as needed.     Ascorbic Acid (VITAMIN C PO) Take by mouth.      ATIVAN 0.5 MG tablet Take 0.5 mg by mouth 2 (two) times daily as needed.     Calcium Carbonate-Vitamin D (OSCAL 500/200 D-3 PO) Take 1 tablet by mouth 2 (two) times daily.     citalopram (CELEXA) 20 MG tablet Take 20 mg by mouth daily.     cyanocobalamin 1000 MCG tablet Take 1,000 mcg by mouth daily.     diltiazem (CARDIZEM) 120 MG tablet Take 120 mg by mouth daily as needed.     docusate sodium (COLACE) 100 MG capsule Take 1 capsule (100 mg total) by mouth 2 (two) times daily. 10 capsule 0   donepezil (ARICEPT) 10 MG tablet Take 10 mg by mouth at bedtime.     ferrous sulfate 325 (65 FE) MG tablet Take 325 mg by mouth daily with breakfast.     furosemide (LASIX) 20 MG tablet Take 20 mg by mouth every other day.     melatonin 1 MG TABS tablet Take 6 mg by mouth at bedtime.     Multiple Vitamins-Minerals (OCUVITE PO) Take 1 tablet by mouth 2 (two) times daily.     polyethylene glycol (MIRALAX / GLYCOLAX) 17 g packet Take 17 g by mouth daily as needed for mild constipation. 14 each 0   XARELTO 10 MG TABS tablet Take 10 mg by mouth daily.     No current facility-administered medications for this visit.   Allergies:  Codeine   Social History: The patient  reports that she has never smoked. She has never used smokeless tobacco. She reports that she does not drink alcohol and does not use drugs.   Family History: The patient's family history includes Alzheimer's disease in her mother; COPD in her mother; Congestive Heart Failure in her mother; Diabetes in her brother.   ROS: No syncope.  Physical Exam: VS:  BP 100/78    Pulse 71    Ht 5\' 3"  (1.6 m)    Wt 146 lb 9.6 oz (66.5 kg)    SpO2 95%    BMI 25.97 kg/m , BMI Body mass index is 25.97 kg/m.  Wt Readings from Last 3 Encounters:  10/02/21 146 lb 9.6 oz (66.5 kg)  08/27/21 150 lb (68 kg)  08/13/21 150 lb (68 kg)    General: Elderly woman seated in wheelchair, appears comfortable at rest. HEENT: Conjunctiva and lids normal, wearing a  mask. Neck: Supple, no elevated JVP or carotid bruits, no thyromegaly. Lungs: Clear to auscultation, nonlabored breathing at rest. Cardiac: Irregularly irregular, no S3, 1/6 systolic murmur, no pericardial rub. Abdomen: Soft, bowel sounds present. Extremities: Trace ankle edema. Skin: Warm and dry. Musculoskeletal: Mild kyphosis. Neuropsychiatric: Alert and oriented x2, affect grossly appropriate.  ECG:  An ECG dated 02/20/2020 was personally reviewed today and demonstrated:  Sinus bradycardia with right bundle branch block and left anterior fascicular block.  Recent Labwork: 07/22/2021: ALT 20; AST 20; BUN 29; Creatinine, Ser 1.23; Hemoglobin 12.4; Platelets 210; Potassium 4.2;  Sodium 135  June 2020: Cholesterol 132, triglycerides 60, HDL 62, LDL 58  Other Studies Reviewed Today:  Cardiac catheterization 01/04/2016: Prox RCA lesion, 20% stenosed. Ost Ramus to Ramus lesion, 40% stenosed. Elevated LVEDP, 27 mmHg   Angiographically minimal coronary disease. No culprit lesion to explain significant elevation in troponin. Suspect some component of stress-induced cardiomyopathy given the elevated LVEDP. However this does not correlate with echocardiographic findings.  TEE 02/01/2016:  - Left ventricle: Systolic function was normal. The estimated    ejection fraction was in the range of 60% to 65%. Wall motion was    normal; there were no regional wall motion abnormalities.  - Aortic valve: Trileaflet; normal thickness, mildly calcified    leaflets.  - Mitral valve: Mildly calcified annulus. There was mild    regurgitation.  - Left atrium: No evidence of thrombus in the atrial cavity or    appendage. No evidence of thrombus in the atrial cavity or    appendage.  - Right atrium: No evidence of thrombus in the atrial cavity or    appendage.  - Atrial septum: No defect or patent foramen ovale was identified.    Echo contrast study showed no right-to-left atrial level shunt,    following  an increase in RA pressure induced by provocative    maneuvers.   Assessment and Plan:  1.  Preoperative cardiac evaluation in an 88 year old woman with history of mild coronary atherosclerosis as of 2017 at which point LVEF was normal, permanent atrial fibrillation with CHA2DS2-VASc score of 8 on Xarelto, previous stroke, hypertension, type 2 diabetes mellitus, dementia, and limited functional capacity.  RCRI cardiac risk calculator indicates at least intermediate risk, all things considered I would place her perioperative cardiac risk in the intermediate to high range.  This does not preclude surgery however particularly if simple mastectomy is felt to be primary treatment for her breast cancer and there are no other options, also after a thorough discussion with surgeon and agreement of patient's son and daughter to the procedure.  We will update an echocardiogram to ensure no significant change in LVEF.  She would need to hold Xarelto 48 hours prior to surgery.  Also, recommended dose for stroke prophylaxis based on present information would be Xarelto 15 mg daily rather than 10 mg daily.  2.  Permanent atrial fibrillation with CHA2DS2-VASc score of 8, asymptomatic in terms of palpitations and with good heart rate control on Cardizem CD.  She is on Xarelto for stroke prophylaxis, dose should be 15 mg daily.   Medication Adjustments/Labs and Tests Ordered: Current medicines are reviewed at length with the patient today.  Concerns regarding medicines are outlined above.   Tests Ordered: Orders Placed This Encounter  Procedures   ECHOCARDIOGRAM COMPLETE    Medication Changes: No orders of the defined types were placed in this encounter.   Disposition:  Follow up  test results.  Signed, Satira Sark, MD, [facility] 10/02/2021 12:00 PM    [facility] [address], [place], [place] [address] Phone: [phone]; Fax: [phone]

## 2021-10-02 NOTE — Addendum Note (Signed)
Addended by: Merlene Laughter on: 10/02/2021 01:04 PM   Modules accepted: Orders

## 2021-10-03 ENCOUNTER — Ambulatory Visit (INDEPENDENT_AMBULATORY_CARE_PROVIDER_SITE_OTHER): Payer: Medicare Other

## 2021-10-03 DIAGNOSIS — Z01810 Encounter for preprocedural cardiovascular examination: Secondary | ICD-10-CM

## 2021-10-03 LAB — ECHOCARDIOGRAM COMPLETE
AR max vel: 1.96 cm2
AV Area VTI: 2.1 cm2
AV Area mean vel: 1.86 cm2
AV Mean grad: 3.5 mmHg
AV Peak grad: 5.7 mmHg
Ao pk vel: 1.2 m/s
Area-P 1/2: 3.2 cm2
MV VTI: 1.65 cm2
S' Lateral: 2.72 cm
Single Plane A4C EF: 62.6 %

## 2021-10-09 ENCOUNTER — Telehealth: Payer: Self-pay | Admitting: *Deleted

## 2021-10-09 NOTE — Telephone Encounter (Signed)
Patient's son Priyana Mccarey informed. Copy sent to Dr. Arnoldo Morale

## 2021-10-09 NOTE — Telephone Encounter (Signed)
-----   Message from Satira Sark, MD sent at 10/03/2021 12:51 PM EST ----- Results reviewed.  Repeat echocardiogram shows vigorous LVEF at 65 to 70%.  Filling pressures are increased, but RV contraction and estimated RV systolic pressure are normal.  She does have mild mitral stenosis and regurgitation and a sclerotic aortic valve without stenosis.  These results do not change recent preoperative recommendations as per office note.

## 2021-10-11 ENCOUNTER — Telehealth: Payer: Self-pay | Admitting: General Surgery

## 2021-10-11 NOTE — Telephone Encounter (Signed)
Patient's son, Jodeen Mclin, called and stated that after meeting with Cardiology and a discussion with his sister, the family has decided not to proceed with surgery.

## 2022-04-27 ENCOUNTER — Emergency Department (HOSPITAL_COMMUNITY): Payer: Medicare Other

## 2022-04-27 ENCOUNTER — Other Ambulatory Visit: Payer: Self-pay

## 2022-04-27 ENCOUNTER — Inpatient Hospital Stay (HOSPITAL_COMMUNITY)
Admission: EM | Admit: 2022-04-27 | Discharge: 2022-05-01 | DRG: 481 | Disposition: A | Discharge status: Skilled Nursing Facility | Payer: Medicare Other | Source: Skilled Nursing Facility | Attending: Family Medicine | Admitting: Family Medicine

## 2022-04-27 ENCOUNTER — Encounter (HOSPITAL_COMMUNITY): Payer: Self-pay

## 2022-04-27 DIAGNOSIS — Z7901 Long term (current) use of anticoagulants: Secondary | ICD-10-CM

## 2022-04-27 DIAGNOSIS — D62 Acute posthemorrhagic anemia: Secondary | ICD-10-CM | POA: Diagnosis not present

## 2022-04-27 DIAGNOSIS — S72141A Displaced intertrochanteric fracture of right femur, initial encounter for closed fracture: Principal | ICD-10-CM | POA: Diagnosis present

## 2022-04-27 DIAGNOSIS — S72051A Unspecified fracture of head of right femur, initial encounter for closed fracture: Secondary | ICD-10-CM | POA: Diagnosis present

## 2022-04-27 DIAGNOSIS — H548 Legal blindness, as defined in USA: Secondary | ICD-10-CM | POA: Diagnosis present

## 2022-04-27 DIAGNOSIS — S72001A Fracture of unspecified part of neck of right femur, initial encounter for closed fracture: Secondary | ICD-10-CM | POA: Diagnosis present

## 2022-04-27 DIAGNOSIS — Z8673 Personal history of transient ischemic attack (TIA), and cerebral infarction without residual deficits: Secondary | ICD-10-CM | POA: Diagnosis not present

## 2022-04-27 DIAGNOSIS — I2510 Atherosclerotic heart disease of native coronary artery without angina pectoris: Secondary | ICD-10-CM | POA: Diagnosis present

## 2022-04-27 DIAGNOSIS — N179 Acute kidney failure, unspecified: Secondary | ICD-10-CM | POA: Diagnosis not present

## 2022-04-27 DIAGNOSIS — I5032 Chronic diastolic (congestive) heart failure: Secondary | ICD-10-CM | POA: Diagnosis present

## 2022-04-27 DIAGNOSIS — Z9049 Acquired absence of other specified parts of digestive tract: Secondary | ICD-10-CM | POA: Diagnosis not present

## 2022-04-27 DIAGNOSIS — Z79899 Other long term (current) drug therapy: Secondary | ICD-10-CM

## 2022-04-27 DIAGNOSIS — N189 Chronic kidney disease, unspecified: Secondary | ICD-10-CM | POA: Diagnosis not present

## 2022-04-27 DIAGNOSIS — I480 Paroxysmal atrial fibrillation: Secondary | ICD-10-CM | POA: Diagnosis present

## 2022-04-27 DIAGNOSIS — I4891 Unspecified atrial fibrillation: Secondary | ICD-10-CM

## 2022-04-27 DIAGNOSIS — I10 Essential (primary) hypertension: Secondary | ICD-10-CM | POA: Diagnosis not present

## 2022-04-27 DIAGNOSIS — Z853 Personal history of malignant neoplasm of breast: Secondary | ICD-10-CM

## 2022-04-27 DIAGNOSIS — N1832 Chronic kidney disease, stage 3b: Secondary | ICD-10-CM | POA: Diagnosis present

## 2022-04-27 DIAGNOSIS — W1830XA Fall on same level, unspecified, initial encounter: Secondary | ICD-10-CM

## 2022-04-27 DIAGNOSIS — S2241XA Multiple fractures of ribs, right side, initial encounter for closed fracture: Secondary | ICD-10-CM | POA: Diagnosis present

## 2022-04-27 DIAGNOSIS — I130 Hypertensive heart and chronic kidney disease with heart failure and stage 1 through stage 4 chronic kidney disease, or unspecified chronic kidney disease: Secondary | ICD-10-CM | POA: Diagnosis present

## 2022-04-27 DIAGNOSIS — I110 Hypertensive heart disease with heart failure: Secondary | ICD-10-CM | POA: Diagnosis not present

## 2022-04-27 DIAGNOSIS — E1122 Type 2 diabetes mellitus with diabetic chronic kidney disease: Secondary | ICD-10-CM | POA: Diagnosis not present

## 2022-04-27 DIAGNOSIS — I959 Hypotension, unspecified: Secondary | ICD-10-CM | POA: Diagnosis not present

## 2022-04-27 DIAGNOSIS — F0150 Vascular dementia without behavioral disturbance: Secondary | ICD-10-CM | POA: Diagnosis present

## 2022-04-27 DIAGNOSIS — Z85038 Personal history of other malignant neoplasm of large intestine: Secondary | ICD-10-CM

## 2022-04-27 DIAGNOSIS — Z825 Family history of asthma and other chronic lower respiratory diseases: Secondary | ICD-10-CM

## 2022-04-27 DIAGNOSIS — Z91199 Patient's noncompliance with other medical treatment and regimen due to unspecified reason: Secondary | ICD-10-CM | POA: Diagnosis not present

## 2022-04-27 DIAGNOSIS — E1165 Type 2 diabetes mellitus with hyperglycemia: Secondary | ICD-10-CM | POA: Diagnosis present

## 2022-04-27 DIAGNOSIS — W06XXXA Fall from bed, initial encounter: Secondary | ICD-10-CM | POA: Diagnosis present

## 2022-04-27 DIAGNOSIS — S42291A Other displaced fracture of upper end of right humerus, initial encounter for closed fracture: Secondary | ICD-10-CM | POA: Diagnosis present

## 2022-04-27 DIAGNOSIS — Z833 Family history of diabetes mellitus: Secondary | ICD-10-CM

## 2022-04-27 DIAGNOSIS — Z885 Allergy status to narcotic agent status: Secondary | ICD-10-CM | POA: Diagnosis not present

## 2022-04-27 DIAGNOSIS — Z79890 Hormone replacement therapy (postmenopausal): Secondary | ICD-10-CM

## 2022-04-27 DIAGNOSIS — E782 Mixed hyperlipidemia: Secondary | ICD-10-CM | POA: Diagnosis present

## 2022-04-27 DIAGNOSIS — I509 Heart failure, unspecified: Secondary | ICD-10-CM | POA: Diagnosis not present

## 2022-04-27 DIAGNOSIS — Z8249 Family history of ischemic heart disease and other diseases of the circulatory system: Secondary | ICD-10-CM

## 2022-04-27 DIAGNOSIS — Z820 Family history of epilepsy and other diseases of the nervous system: Secondary | ICD-10-CM

## 2022-04-27 DIAGNOSIS — Z66 Do not resuscitate: Secondary | ICD-10-CM | POA: Diagnosis present

## 2022-04-27 DIAGNOSIS — S42201A Unspecified fracture of upper end of right humerus, initial encounter for closed fracture: Secondary | ICD-10-CM

## 2022-04-27 DIAGNOSIS — E119 Type 2 diabetes mellitus without complications: Secondary | ICD-10-CM

## 2022-04-27 DIAGNOSIS — Z96642 Presence of left artificial hip joint: Secondary | ICD-10-CM | POA: Diagnosis present

## 2022-04-27 LAB — BASIC METABOLIC PANEL
Anion gap: 9 (ref 5–15)
BUN: 27 mg/dL — ABNORMAL HIGH (ref 8–23)
CO2: 23 mmol/L (ref 22–32)
Calcium: 9.1 mg/dL (ref 8.9–10.3)
Chloride: 104 mmol/L (ref 98–111)
Creatinine, Ser: 1.37 mg/dL — ABNORMAL HIGH (ref 0.44–1.00)
GFR, Estimated: 37 mL/min — ABNORMAL LOW (ref >60)
Glucose, Bld: 275 mg/dL — ABNORMAL HIGH (ref 70–99)
Potassium: 4.4 mmol/L (ref 3.5–5.1)
Sodium: 136 mmol/L (ref 135–145)

## 2022-04-27 LAB — CBC
HCT: 38.9 % (ref 36.0–46.0)
Hemoglobin: 12.9 g/dL (ref 12.0–15.0)
MCH: 29.7 pg (ref 26.0–34.0)
MCHC: 33.2 g/dL (ref 30.0–36.0)
MCV: 89.6 fL (ref 80.0–100.0)
Platelets: 177 10*3/uL (ref 150–400)
RBC: 4.34 MIL/uL (ref 3.87–5.11)
RDW: 13 % (ref 11.5–15.5)
WBC: 15.8 10*3/uL — ABNORMAL HIGH (ref 4.0–10.5)
nRBC: 0 % (ref 0.0–0.2)

## 2022-04-27 LAB — MAGNESIUM: Magnesium: 1.8 mg/dL (ref 1.7–2.4)

## 2022-04-27 MED ORDER — DILTIAZEM HCL 25 MG/5ML IV SOLN
10.0000 mg | Freq: Once | INTRAVENOUS | Status: AC
  Administered 2022-04-27: 10 mg via INTRAVENOUS
  Filled 2022-04-27: qty 5

## 2022-04-27 MED ORDER — LORAZEPAM 0.5 MG PO TABS
0.5000 mg | ORAL_TABLET | Freq: Two times a day (BID) | ORAL | Status: DC | PRN
  Administered 2022-04-29: 0.5 mg via ORAL
  Filled 2022-04-27: qty 1

## 2022-04-27 MED ORDER — DILTIAZEM HCL 30 MG PO TABS
120.0000 mg | ORAL_TABLET | Freq: Every day | ORAL | Status: DC
Start: 2022-04-27 — End: 2022-04-29
  Administered 2022-04-29: 120 mg via ORAL
  Filled 2022-04-27: qty 4

## 2022-04-27 MED ORDER — POLYETHYLENE GLYCOL 3350 17 G PO PACK: 17.0000 g | PACK | Freq: Every day | ORAL | Status: DC | PRN

## 2022-04-27 MED ORDER — SODIUM CHLORIDE 0.9 % IV SOLN: INTRAVENOUS | Status: DC

## 2022-04-27 MED ORDER — MORPHINE SULFATE (PF) 4 MG/ML IV SOLN
4.0000 mg | INTRAVENOUS | Status: DC | PRN
  Administered 2022-04-28 (×2): 4 mg via INTRAVENOUS
  Filled 2022-04-27 (×2): qty 1

## 2022-04-27 MED ORDER — MORPHINE SULFATE (PF) 4 MG/ML IV SOLN
4.0000 mg | Freq: Once | INTRAVENOUS | Status: AC
  Administered 2022-04-27: 4 mg via INTRAVENOUS
  Filled 2022-04-27: qty 1

## 2022-04-27 MED ORDER — DONEPEZIL HCL 10 MG PO TABS
10.0000 mg | ORAL_TABLET | Freq: Every day | ORAL | Status: DC
  Administered 2022-04-28 – 2022-04-30 (×3): 10 mg via ORAL
  Filled 2022-04-27: qty 2
  Filled 2022-04-27 (×3): qty 1

## 2022-04-27 MED ORDER — METOPROLOL TARTRATE 5 MG/5ML IV SOLN
5.0000 mg | INTRAVENOUS | Status: AC | PRN
  Administered 2022-04-28 (×2): 5 mg via INTRAVENOUS
  Filled 2022-04-27 (×2): qty 5

## 2022-04-27 NOTE — Assessment & Plan Note (Signed)
With hyperglycemia.  Glucose 275. - SSI- q4h - HgbA1c

## 2022-04-27 NOTE — Progress Notes (Signed)
Ortho Trauma Note  Received consult by Dr. Tomi Bamberger. 88 yo female with right intertrochanteric femur fracture and right proximal humerus fracture. I fixed her left hip fracture previously. Will plan for admission to hospitalist service at [facility] for surgery tomorrow. NPO after midnight. Will plan for nonoperative management for right proximal humerus fracture. Consult to follow tomorrow AM.  Shona Needles, MD [facility] [phone] (office) [email or web address]

## 2022-04-27 NOTE — Assessment & Plan Note (Signed)
Status post mechanical fall off from the bed.  Right shoulder x-ray- Comminuted fracture of the right humeral head. Nonoperative per orthopedic

## 2022-04-27 NOTE — Assessment & Plan Note (Addendum)
Status post mechanical fall.  X-ray pelvis- Comminuted displaced fracture is seen in the intertrochanteric portion of proximal right femur.  Per medication list from nursing home, last Xarelto  dose was this morning- 8/13. - EDP to Dr. Doreatha Martin, admit to [facility], n.p.o. midnight. -IV morphine 4 mg as needed - N/s 75cc/hr x 12 hrs

## 2022-04-27 NOTE — H&P (Addendum)
History and Physical    STEPHANEY STEVEN Parks:[id number] DOB: 1929/11/28 DOA: 04/27/2022  PCP: Laura Burly, MD   Patient coming from: [facility]  I have personally briefly reviewed patient's old medical records in [place]  Chief Complaint: Fall  HPI: Laura Parks is a 88 y.o. female with medical history significant for dementia, diabetes mellitus, atrial fibrillation, legally blind, hypertension, stroke, CHF. Patient was brought to the ED from [facility] and reports that she fell out of bed onto her right side and is complaining of right hip and right arm pain.  The time of my evaluation patient is awake, but not answering questions due to dementia. Per medication list from the nursing center at bedside, patient's last dose of Eliquis was this morning.  ED Course: Temperature 97.7.  Heart rate 91-105.  Respirate rate 13-18.  Blood pressure systolic 07-622.  O2 sats greater than 96% on room air.  WBC 15.8. X-ray hip-comminuted displaced fracture intertrochanteric portion of proximal right femoral X-ray shoulder-comminuted fracture of the right femoral head X-ray chest-mildly displaced fracture of right fourth and fifth ribs.  No pneumothorax. Head and cervical CT no acute findings. Diltiazem 10 mg given for increased heart rate. IV Morphine 4 mg given Shoulder immobilizer applied.  EDP talked with Dr. Doreatha Martin, recommend admission to [facility], plan for non-op management of right proximal humerus fracture.  N.p.o. midnight  Review of Systems: Unable to ascertain due to baseline dementia.  Past Medical History:  Diagnosis Date   Anemia    Cognitive impairment    Colon cancer ([place])    Coronary artery disease    Minimal coronary atherosclerosis at cardiac catheterization April 2017   Essential hypertension, benign    History of pneumonia    Iron deficiency anemia    Erosive antral gastritis   Legal blindness    Macular degeneration    Mixed hyperlipidemia    PAF  (paroxysmal atrial fibrillation) ([place])    Stroke ([place]) 01/2016   [place] infract   Type 2 diabetes mellitus ([place])     Past Surgical History:  Procedure Laterality Date   APPENDECTOMY     CARDIAC CATHETERIZATION N/A 01/04/2016   Procedure: Left Heart Cath and Coronary Angiography;  Surgeon: Leonie Man, MD;  Location: [facility];  Service: Cardiovascular;  Laterality: N/A;   CATARACT EXTRACTION     CHOLECYSTECTOMY     Colon surgery for colon cancer     1992 by Dr Mendel Ryder   COLONOSCOPY  03/24/2012   Procedure: COLONOSCOPY;  Surgeon: Rogene Houston, MD;  Location: [facility];  Service: Endoscopy;  Laterality: N/A;  1200   COLONOSCOPY N/A 07/03/2016   Procedure: COLONOSCOPY;  Surgeon: Rogene Houston, MD;  Location: [facility];  Service: Endoscopy;  Laterality: N/A;  200   ESOPHAGOGASTRODUODENOSCOPY  08/04/2012   Procedure: ESOPHAGOGASTRODUODENOSCOPY (EGD);  Surgeon: Rogene Houston, MD;  Location[facility];  Service: Endoscopy;  Laterality: N/A;  325   INTRAMEDULLARY (IM) NAIL INTERTROCHANTERIC Left 06/09/2019   Procedure: INTRAMEDULLARY (IM) NAIL INTERTROCHANTRIC;  Surgeon: Shona Needles, MD;  Location: [facility];  Service: Orthopedics;  Laterality: Left;   Right knee surgery     TEE WITHOUT CARDIOVERSION N/A 02/01/2016   Procedure: TRANSESOPHAGEAL ECHOCARDIOGRAM (TEE);  Surgeon: Jerline Pain, MD;  Location[facility] ENDOSCOPY;  Service: Cardiovascular;  Laterality: N/A;   TUBAL LIGATION       reports that she has never smoked. She has never used  smokeless tobacco. She reports that she does not drink alcohol and does not use drugs.  Allergies  Allergen Reactions   Codeine Nausea Only    Family History  Problem Relation Age of Onset   COPD Mother    Alzheimer's disease Mother    Congestive Heart Failure Mother    Diabetes Brother    Prior to Admission medications   Medication Sig Start Date End Date Taking? Authorizing Provider  acetaminophen (TYLENOL) 325 MG  tablet Take 2 tablets (650 mg total) by mouth every 6 (six) hours. 06/16/19   Bonnell Public, MD  Alum & Mag Hydroxide-Simeth (MYLANTA PO) Take 5 mLs by mouth as needed.    [provider]  Ascorbic Acid (VITAMIN C PO) Take by mouth.    [provider]  ATIVAN 0.5 MG tablet Take 0.5 mg by mouth 2 (two) times daily as needed. 07/12/21   [provider]  Calcium Carbonate-Vitamin D (OSCAL 500/200 D-3 PO) Take 1 tablet by mouth 2 (two) times daily.    [provider]  citalopram (CELEXA) 20 MG tablet Take 20 mg by mouth daily.    [provider]  cyanocobalamin 1000 MCG tablet Take 1,000 mcg by mouth daily.    [provider]  diltiazem (CARDIZEM) 120 MG tablet Take 120 mg by mouth daily as needed. 07/19/21   [provider]  docusate sodium (COLACE) 100 MG capsule Take 1 capsule (100 mg total) by mouth 2 (two) times daily. 06/16/19   Dana Allan I, MD  donepezil (ARICEPT) 10 MG tablet Take 10 mg by mouth at bedtime.    [provider]  ferrous sulfate 325 (65 FE) MG tablet Take 325 mg by mouth daily with breakfast.    [provider]  furosemide (LASIX) 20 MG tablet Take 20 mg by mouth every other day.    [provider]  melatonin 1 MG TABS tablet Take 6 mg by mouth at bedtime.    [provider]  Multiple Vitamins-Minerals (OCUVITE PO) Take 1 tablet by mouth 2 (two) times daily.    [provider]  polyethylene glycol (MIRALAX / GLYCOLAX) 17 g packet Take 17 g by mouth daily as needed for mild constipation. 06/16/19   Bonnell Public, MD  Rivaroxaban (XARELTO) 15 MG TABS tablet Take 1 tablet (15 mg total) by mouth daily with supper. 10/02/21   Satira Sark, MD    Physical Exam: Exam limited due to the dementia Vitals:   04/27/22 1732 04/27/22 1737 04/27/22 1930 04/27/22 2030  BP:  (!) 172/103 (!) 160/103 90/77  Pulse:  91 (!) 101 100  Resp:  '15 17 16  '$ Temp:       TempSrc:      SpO2:  96% 98% (!) 9%  Weight: 63.5 kg     Height: 5' (1.524 m)       Constitutional: NAD, calm, comfortable Vitals:   04/27/22 1732 04/27/22 1737 04/27/22 1930 04/27/22 2030  BP:  (!) 172/103 (!) 160/103 90/77  Pulse:  91 (!) 101 100  Resp:  '15 17 16  '$ Temp:      TempSrc:      SpO2:  96% 98% (!) 9%  Weight: 63.5 kg     Height: 5' (1.524 m)      Eyes: Pupils equal, lids and conjunctivae normal ENMT: Mucous membranes are mildly dry.  Neck: normal, supple, no masses, no thyromegaly Respiratory: clear to auscultation bilaterally, no wheezing, no crackles. Normal respiratory  effort. No accessory muscle use.  Cardiovascular: Regular rate and rhythm, no murmurs / rubs / gallops. No extremity edema.  Abdomen: no tenderness, no masses palpated. No hepatosplenomegaly Musculoskeletal: no clubbing / cyanosis.   Skin: no rashes, lesions, ulcers. No induration Neurologic: Exam limited by patient's dementia, no apparent cranial nerve abnormality . Psychiatric: Awake, alert, not answering questions.  Labs on Admission: I have personally reviewed following labs and imaging studies  CBC: Recent Labs  Lab 04/27/22 1919  WBC 15.8*  HGB 12.9  HCT 38.9  MCV 89.6  PLT 948   Basic Metabolic Panel: Recent Labs  Lab 04/27/22 1919  NA 136  K 4.4  CL 104  CO2 23  GLUCOSE 275*  BUN 27*  CREATININE 1.37*  CALCIUM 9.1   Urine analysis:    Component Value Date/Time   COLORURINE YELLOW 06/09/2019 0318   APPEARANCEUR CLEAR 06/09/2019 0318   LABSPEC 1.015 06/09/2019 0318   PHURINE 7.0 06/09/2019 0318   GLUCOSEU 150 (A) 06/09/2019 0318   HGBUR NEGATIVE 06/09/2019 0318   BILIRUBINUR NEGATIVE 06/09/2019 0318   KETONESUR NEGATIVE 06/09/2019 0318   PROTEINUR 100 (A) 06/09/2019 0318   NITRITE NEGATIVE 06/09/2019 0318   LEUKOCYTESUR NEGATIVE 06/09/2019 0318    Radiological Exams on Admission: CT Cervical Spine Wo Contrast  Result Date: 04/27/2022 CLINICAL DATA:  Fall  out of bed EXAM: CT HEAD WITHOUT CONTRAST CT CERVICAL SPINE WITHOUT CONTRAST TECHNIQUE: Multidetector CT imaging of the head and cervical spine was performed following the standard protocol without intravenous contrast. Multiplanar CT image reconstructions of the cervical spine were also generated. RADIATION DOSE REDUCTION: This exam was performed according to the departmental dose-optimization program which includes automated exposure control, adjustment of the mA and/or kV according to patient size and/or use of iterative reconstruction technique. COMPARISON:  MRI brain Jan 30, 2016. FINDINGS: CT HEAD FINDINGS Brain: Age-related global parenchymal volume loss. Patchy and more confluent subcortical and periventricular white matter hypodensities are nonspecific but are similar prior brain MRI and most consistent with chronic ischemic small vessel microvascular disease. No evidence of acute large vascular territory infarction, hemorrhage, hydrocephalus extra-axial collection or mass lesion/mass effect. Vascular: No hyperdense vessel. Atherosclerotic calcifications of the internal carotid and vertebral arteries at the skull base. Skull: Normal. Negative for fracture or focal lesion. Sinuses/Orbits: Visualized portions of the paranasal sinuses are predominantly clear. Orbits are grossly unremarkable. Other: Mastoid air cells are predominantly clear. CT CERVICAL SPINE FINDINGS Alignment: Straightening of the normal cervical lordosis commonly positional. No evidence of traumatic listhesis. Degenerative grade 1 L3 on L4 anterolisthesis. Skull base and vertebrae: No acute displaced fracture identified. Irregularity of the dens with small anterior erosions and a partially mineralized pannus. Soft tissues and spinal canal: No prevertebral fluid or swelling. No visible canal hematoma. Disc levels: Multilevel degenerative changes spine with disc space narrowing, osteophyte formation, uncovertebral/facet hypertrophy and  ligamentum flavum thickening/mineralization with multifocal neural foraminal and spinal canal narrowing most prominent at C5-C6. Upper chest: No acute abnormality. Other: Carotid artery calcifications. IMPRESSION: 1. No acute intracranial abnormality. 2. Similar age-related global parenchymal volume loss with moderate burden of chronic ischemic small vessel white matter disease. 3. No convincing evidence of acute fracture or traumatic listhesis of the cervical spine. 4. Irregularity of the dens with small anterior erosions and a partially mineralized pannus. 5. Multilevel degenerative change of the spine with multifocal neural foraminal and spinal canal narrowing most prominent at C5-C6. Electronically Signed   By: Dahlia Bailiff M.D.   On: 04/27/2022  19:13   CT Head Wo Contrast  Result Date: 04/27/2022 CLINICAL DATA:  Trauma EXAM: CT HEAD WITHOUT CONTRAST TECHNIQUE: Contiguous axial images were obtained from the base of the skull through the vertex without intravenous contrast. RADIATION DOSE REDUCTION: This exam was performed according to the departmental dose-optimization program which includes automated exposure control, adjustment of the mA and/or kV according to patient size and/or use of iterative reconstruction technique. COMPARISON:  01/30/2016 FINDINGS: Brain: There is patient motion in some of the images. Additional images were repeated. No acute intracranial findings are seen. There are no signs of bleeding within the cranium. Cortical sulci are prominent, more so in both frontal regions. There is decreased density in periventricular white matter. Vascular: Scattered arterial calcifications are seen. Skull: No fracture is seen. Sinuses/Orbits: Unremarkable. Other: None. IMPRESSION: No acute intracranial findings are seen in noncontrast CT brain. Atrophy. Small vessel disease. Electronically Signed   By: Elmer Picker M.D.   On: 04/27/2022 19:05   DG Chest 1 View  Result Date:  04/27/2022 CLINICAL DATA:  Right-sided pain after fall EXAM: CHEST  1 VIEW COMPARISON:  Radiographs 02/20/2020 FINDINGS: Mildly displaced fractures of the anterolateral right fourth and fifth ribs. Partially visualized comminuted fracture of the right humeral head. Stable cardiomediastinal silhouette. Mitral annular calcification. Aortic calcification. No focal consolidation, pleural effusion, or pneumothorax. Linear atelectasis in the lingula. Mild pulmonary vascular congestion. IMPRESSION: 1. Mildly displaced fractures of the right anterolateral fourth and fifth ribs. No pneumothorax. 2. Partially visualized comminuted fracture of the proximal right humeral head. 3. Cardiomegaly and pulmonary vascular congestion. Electronically Signed   By: Placido Sou M.D.   On: 04/27/2022 18:57   DG Shoulder Right  Result Date: 04/27/2022 CLINICAL DATA:  Fall out of bed onto right side now with right arm pain EXAM: RIGHT SHOULDER - 2+ VIEW COMPARISON:  Radiographs 02/20/2020 FINDINGS: Acute comminuted mildly displaced fracture of the anatomical neck of the humerus. Mild lateral displacement of the greater tuberosity. Question additional fracture of the lesser tuberosity. No evidence of dislocation. No additional fractures. Soft tissue swelling about the shoulder. IMPRESSION: Comminuted fracture of the right humeral head. Electronically Signed   By: Placido Sou M.D.   On: 04/27/2022 18:52   DG Hip Unilat W or Wo Pelvis 2-3 Views Right  Result Date: 04/27/2022 CLINICAL DATA:  Trauma, fall EXAM: DG HIP (WITH OR WITHOUT PELVIS) 2-3V RIGHT COMPARISON:  None Available. FINDINGS: There is comminuted intertrochanteric fracture of proximal right femur. There is overriding of fracture fragments. There is 2 cm offset in alignment of lateral cortical margins in proximal femur. There is previous internal fixation in left femur. Vascular calcifications are seen. Degenerative changes are noted in lumbar spine. IMPRESSION:  Comminuted displaced fracture is seen in the intertrochanteric portion of proximal right femur. Electronically Signed   By: Elmer Picker M.D.   On: 04/27/2022 18:50    EKG: Independently reviewed.  Atrial fibrillation rate 104.  QTc prolonged at 502.  Assessment/Plan Principal Problem:   Closed right hip fracture (HCC) Active Problems:   Essential hypertension, benign   Type 2 diabetes mellitus (HCC)   CHF (congestive heart failure) (HCC)   PAF (paroxysmal atrial fibrillation) (HCC)   Vascular dementia (HCC)   Humerus head fracture, right, closed, initial encounter   Assessment and Plan: * Closed right hip fracture ([place]) Status post mechanical fall.  X-ray pelvis- Comminuted displaced fracture is seen in the intertrochanteric portion of proximal right femur.  Per medication list from nursing home,  last Xarelto  dose was this morning- 8/13. - EDP to Dr. Doreatha Martin, admit to [facility], n.p.o. midnight. -IV morphine 4 mg as needed - N/s 75cc/hr x 12 hrs  Humerus head fracture, right, closed, initial encounter Status post mechanical fall off from the bed.  Right shoulder x-ray- Comminuted fracture of the right humeral head.  Vascular dementia [facility]) Nursing home resident. -Resume Aricept.  PAF (paroxysmal atrial fibrillation) ([place]) Heart rates up to 105.  On anticoagulation with Xarelto.  Last dose of Xarelto was this morning.  Last echo 09/2021 EF of 65 to 70%.  -Hold Xarelto -Resume Cardizem -Will add as needed IV metoprolol 5 mg for heart rate > 120  CHF (congestive heart failure) (HCC) Chronic diastolic CHF.  Stable and compensated.  Lasix 20 mg every other day.  Last echo 09/2021, EF of 65 to 70%.  QTc prolonged at 502.  Previously borderline prolonged. -Check magnesium  Type 2 diabetes mellitus ([place]) With hyperglycemia.  Glucose 275. - SSI- q4h - HgbA1c  Essential hypertension, benign Stable. -Resume home diltiazem -Hold Lasix while n.p.o.   DVT prophylaxis:  SCDS Code Status: DNR-dnr form at bedside Family Communication: None at bedside Disposition Plan: > 2 days Consults called: Ortho Admission status:  Inpt tele I certify that at the point of admission it is my clinical judgment that the patient will require inpatient hospital care spanning beyond 2 midnights from the point of admission due to high intensity of service, high risk for further deterioration and high frequency of surveillance required.   Author: Bethena Roys, MD 04/27/2022 11:00 PM  For on call review [email or web address].

## 2022-04-27 NOTE — ED Notes (Signed)
Patient transported to CT 

## 2022-04-27 NOTE — Assessment & Plan Note (Addendum)
Heart rates up to 105.  On anticoagulation with Xarelto.  Last dose of Xarelto was this morning.  Last echo 09/2021 EF of 65 to 70%.  -Hold Xarelto -Resume Cardizem -Will add as needed IV metoprolol 5 mg for heart rate > 120

## 2022-04-27 NOTE — Assessment & Plan Note (Signed)
Stable. -Resume home diltiazem Resume lasix once hypotension improves

## 2022-04-27 NOTE — ED Provider Notes (Signed)
[facility] Provider Note   CSN: [id number] Arrival date & time: 04/27/22  1717     History  Chief Complaint  Patient presents with   Laura Parks    NESHA COUNIHAN is a 88 y.o. female.   Fall   Patient presents to the ED for evaluation after a fall.  Patient is a resident of a nursing facility.  She does have history of diabetes, colon cancer hypertension, hyperlipidemia, stroke, cognitive impairment.  Patient was sent from the [facility] at ED and after falling out of bed onto her right side.  Patient has been complaining of pain in her right hip and right arm.  No known injury to her head.  Patient however is asking to get out of the bed.  Seems confused somewhat about her injury    Home Medications Prior to Admission medications   Medication Sig Start Date End Date Taking? Authorizing Provider  acetaminophen (TYLENOL) 325 MG tablet Take 2 tablets (650 mg total) by mouth every 6 (six) hours. 06/16/19   Bonnell Public, MD  Alum & Mag Hydroxide-Simeth (MYLANTA PO) Take 5 mLs by mouth as needed.    [provider]  Ascorbic Acid (VITAMIN C PO) Take by mouth.    [provider]  ATIVAN 0.5 MG tablet Take 0.5 mg by mouth 2 (two) times daily as needed. 07/12/21   [provider]  Calcium Carbonate-Vitamin D (OSCAL 500/200 D-3 PO) Take 1 tablet by mouth 2 (two) times daily.    [provider]  citalopram (CELEXA) 20 MG tablet Take 20 mg by mouth daily.    [provider]  cyanocobalamin 1000 MCG tablet Take 1,000 mcg by mouth daily.    [provider]  diltiazem (CARDIZEM) 120 MG tablet Take 120 mg by mouth daily as needed. 07/19/21   [provider]  docusate sodium (COLACE) 100 MG capsule Take 1 capsule (100 mg total) by mouth 2 (two) times daily. 06/16/19   Dana Allan I, MD  donepezil (ARICEPT) 10 MG tablet Take 10 mg by mouth at bedtime.    [provider]  ferrous sulfate 325 (65 FE)  MG tablet Take 325 mg by mouth daily with breakfast.    [provider]  furosemide (LASIX) 20 MG tablet Take 20 mg by mouth every other day.    [provider]  melatonin 1 MG TABS tablet Take 6 mg by mouth at bedtime.    [provider]  Multiple Vitamins-Minerals (OCUVITE PO) Take 1 tablet by mouth 2 (two) times daily.    [provider]  polyethylene glycol (MIRALAX / GLYCOLAX) 17 g packet Take 17 g by mouth daily as needed for mild constipation. 06/16/19   Bonnell Public, MD  Rivaroxaban (XARELTO) 15 MG TABS tablet Take 1 tablet (15 mg total) by mouth daily with supper. 10/02/21   Satira Sark, MD      Allergies    Codeine    Review of Systems   Review of Systems  Physical Exam Updated Vital Signs BP 90/77   Pulse (!) 101   Temp 97.7 F (36.5 C) (Oral)   Resp 16   Ht 1.524 m (5')   Wt 63.5 kg   SpO2 98%   BMI 27.34 kg/m  Physical Exam Vitals and nursing note reviewed.  Constitutional:      General: She is not in acute distress.    Appearance: She is well-developed.  HENT:  Head: Normocephalic and atraumatic.     Right Ear: External ear normal.     Left Ear: External ear normal.  Eyes:     General: No scleral icterus.       Right eye: No discharge.        Left eye: No discharge.     Conjunctiva/sclera: Conjunctivae normal.  Neck:     Trachea: No tracheal deviation.  Cardiovascular:     Rate and Rhythm: Normal rate.  Pulmonary:     Effort: Pulmonary effort is normal. No respiratory distress.     Breath sounds: No stridor.  Abdominal:     General: There is no distension.  Musculoskeletal:        General: Swelling, tenderness and deformity present.     Cervical back: Neck supple.     Comments: Tenderness palpation right shoulder and right hip, limited range of motion in both, right lower extremity appears shortened  Skin:    General: Skin is warm and dry.     Findings: No rash.  Neurological:     Mental Status:  She is alert.     Cranial Nerves: Cranial nerve deficit: no gross deficits.     Comments: , No facial droop noted, patient moving her extremities     ED Results / Procedures / Treatments   Labs (all labs ordered are listed, but only abnormal results are displayed) Labs Reviewed  CBC - Abnormal; Notable for the following components:      Result Value   WBC 15.8 (*)    All other components within normal limits  BASIC METABOLIC PANEL - Abnormal; Notable for the following components:   Glucose, Bld 275 (*)    BUN 27 (*)    Creatinine, Ser 1.37 (*)    GFR, Estimated 37 (*)    All other components within normal limits    EKG EKG Interpretation  Date/Time:  Sunday April 27 2022 19:42:04 EDT Ventricular Rate:  104 PR Interval:    QRS Duration: 123 QT Interval:  381 QTC Calculation: 502 R Axis:   -85 Text Interpretation: Atrial fibrillation RBBB and LAFB Confirmed by Dorie Rank [phone]) on 04/27/2022 7:48:20 PM  Radiology CT Cervical Spine Wo Contrast  Result Date: 04/27/2022 CLINICAL DATA:  Fall out of bed EXAM: CT HEAD WITHOUT CONTRAST CT CERVICAL SPINE WITHOUT CONTRAST TECHNIQUE: Multidetector CT imaging of the head and cervical spine was performed following the standard protocol without intravenous contrast. Multiplanar CT image reconstructions of the cervical spine were also generated. RADIATION DOSE REDUCTION: This exam was performed according to the departmental dose-optimization program which includes automated exposure control, adjustment of the mA and/or kV according to patient size and/or use of iterative reconstruction technique. COMPARISON:  MRI brain Jan 30, 2016. FINDINGS: CT HEAD FINDINGS Brain: Age-related global parenchymal volume loss. Patchy and more confluent subcortical and periventricular white matter hypodensities are nonspecific but are similar prior brain MRI and most consistent with chronic ischemic small vessel microvascular disease. No evidence of acute large  vascular territory infarction, hemorrhage, hydrocephalus extra-axial collection or mass lesion/mass effect. Vascular: No hyperdense vessel. Atherosclerotic calcifications of the internal carotid and vertebral arteries at the skull base. Skull: Normal. Negative for fracture or focal lesion. Sinuses/Orbits: Visualized portions of the paranasal sinuses are predominantly clear. Orbits are grossly unremarkable. Other: Mastoid air cells are predominantly clear. CT CERVICAL SPINE FINDINGS Alignment: Straightening of the normal cervical lordosis commonly positional. No evidence of traumatic listhesis. Degenerative grade 1 L3 on L4 anterolisthesis. Skull base  and vertebrae: No acute displaced fracture identified. Irregularity of the dens with small anterior erosions and a partially mineralized pannus. Soft tissues and spinal canal: No prevertebral fluid or swelling. No visible canal hematoma. Disc levels: Multilevel degenerative changes spine with disc space narrowing, osteophyte formation, uncovertebral/facet hypertrophy and ligamentum flavum thickening/mineralization with multifocal neural foraminal and spinal canal narrowing most prominent at C5-C6. Upper chest: No acute abnormality. Other: Carotid artery calcifications. IMPRESSION: 1. No acute intracranial abnormality. 2. Similar age-related global parenchymal volume loss with moderate burden of chronic ischemic small vessel white matter disease. 3. No convincing evidence of acute fracture or traumatic listhesis of the cervical spine. 4. Irregularity of the dens with small anterior erosions and a partially mineralized pannus. 5. Multilevel degenerative change of the spine with multifocal neural foraminal and spinal canal narrowing most prominent at C5-C6. Electronically Signed   By: Dahlia Bailiff M.D.   On: 04/27/2022 19:13   CT Head Wo Contrast  Result Date: 04/27/2022 CLINICAL DATA:  Trauma EXAM: CT HEAD WITHOUT CONTRAST TECHNIQUE: Contiguous axial images were  obtained from the base of the skull through the vertex without intravenous contrast. RADIATION DOSE REDUCTION: This exam was performed according to the departmental dose-optimization program which includes automated exposure control, adjustment of the mA and/or kV according to patient size and/or use of iterative reconstruction technique. COMPARISON:  01/30/2016 FINDINGS: Brain: There is patient motion in some of the images. Additional images were repeated. No acute intracranial findings are seen. There are no signs of bleeding within the cranium. Cortical sulci are prominent, more so in both frontal regions. There is decreased density in periventricular white matter. Vascular: Scattered arterial calcifications are seen. Skull: No fracture is seen. Sinuses/Orbits: Unremarkable. Other: None. IMPRESSION: No acute intracranial findings are seen in noncontrast CT brain. Atrophy. Small vessel disease. Electronically Signed   By: Elmer Picker M.D.   On: 04/27/2022 19:05   DG Chest 1 View  Result Date: 04/27/2022 CLINICAL DATA:  Right-sided pain after fall EXAM: CHEST  1 VIEW COMPARISON:  Radiographs 02/20/2020 FINDINGS: Mildly displaced fractures of the anterolateral right fourth and fifth ribs. Partially visualized comminuted fracture of the right humeral head. Stable cardiomediastinal silhouette. Mitral annular calcification. Aortic calcification. No focal consolidation, pleural effusion, or pneumothorax. Linear atelectasis in the lingula. Mild pulmonary vascular congestion. IMPRESSION: 1. Mildly displaced fractures of the right anterolateral fourth and fifth ribs. No pneumothorax. 2. Partially visualized comminuted fracture of the proximal right humeral head. 3. Cardiomegaly and pulmonary vascular congestion. Electronically Signed   By: Placido Sou M.D.   On: 04/27/2022 18:57   DG Shoulder Right  Result Date: 04/27/2022 CLINICAL DATA:  Fall out of bed onto right side now with right arm pain EXAM:  RIGHT SHOULDER - 2+ VIEW COMPARISON:  Radiographs 02/20/2020 FINDINGS: Acute comminuted mildly displaced fracture of the anatomical neck of the humerus. Mild lateral displacement of the greater tuberosity. Question additional fracture of the lesser tuberosity. No evidence of dislocation. No additional fractures. Soft tissue swelling about the shoulder. IMPRESSION: Comminuted fracture of the right humeral head. Electronically Signed   By: Placido Sou M.D.   On: 04/27/2022 18:52   DG Hip Unilat W or Wo Pelvis 2-3 Views Right  Result Date: 04/27/2022 CLINICAL DATA:  Trauma, fall EXAM: DG HIP (WITH OR WITHOUT PELVIS) 2-3V RIGHT COMPARISON:  None Available. FINDINGS: There is comminuted intertrochanteric fracture of proximal right femur. There is overriding of fracture fragments. There is 2 cm offset in alignment of lateral cortical margins  in proximal femur. There is previous internal fixation in left femur. Vascular calcifications are seen. Degenerative changes are noted in lumbar spine. IMPRESSION: Comminuted displaced fracture is seen in the intertrochanteric portion of proximal right femur. Electronically Signed   By: Elmer Picker M.D.   On: 04/27/2022 18:50    Procedures Procedures    Medications Ordered in ED Medications  morphine (PF) 4 MG/ML injection 4 mg (4 mg Intravenous Given 04/27/22 2013)  diltiazem (CARDIZEM) injection 10 mg (10 mg Intravenous Given 04/27/22 2014)    ED Course/ Medical Decision Making/ A&P Clinical Course as of 04/27/22 2034  Sun Apr 27, 2022  1946 DG Shoulder Right X-ray shows a comminuted fracture of the right humeral head [JK]  1947 DG Hip Unilat W or Wo Pelvis 2-3 Views Right Patient has an intertrochanteric fracture of the right hip [JK]  1947 DG Chest 1 View Chest x-ray shows evidence of rib fractures [JK]  2004 CBC(!) White blood cell count elevated [JK]  0998 Basic metabolic panel(!) Blood sugar elevated [JK]  2009 Case discussed with Dr.  Doreatha Martin regarding her orthopedic injuries.  Plan on transfer to [facility] [JK]  2034 Case discussed with Dr. Denton Brick regarding admission [JK]    Clinical Course User Index [JK] Dorie Rank, MD                           Medical Decision Making Problems Addressed: Atrial fibrillation, unspecified type [facility]): chronic illness or injury Closed displaced intertrochanteric fracture of right femur, initial encounter [facility]): acute illness or injury that poses a threat to life or bodily functions Closed fracture of multiple ribs of right side, initial encounter: acute illness or injury Closed fracture of proximal end of right humerus, unspecified fracture morphology, initial encounter: acute illness or injury that poses a threat to life or bodily functions Ground-level fall: acute illness or injury  Amount and/or Complexity of Data Reviewed Labs: ordered. Decision-making details documented in ED Course. Radiology: ordered and independent interpretation performed. Decision-making details documented in ED Course.  Risk Prescription drug management. Decision regarding hospitalization.  Patient presented to the ED for evaluation after a fall from a nursing facility.  Patient had obvious swelling in her right shoulder.  She had deformity of her right hip.  Unfortunately she appears to have broken her proximal humerus as well as her hip.  There is also rib fractures.  Patient has known A-fib.  She has had some periods of tachycardia so I ordered a dose of Cardizem.  Patient has been given pain medications for her injuries.  I have spoken with Dr. Doreatha Martin orthopedics.  We will plan on admitting her to [facility] for further treatment and evaluation  Sling ordered for her proximal humerus fracture         Final Clinical Impression(s) / ED Diagnoses Final diagnoses:  Closed displaced intertrochanteric fracture of right femur, initial encounter ([place])  Closed fracture of proximal  end of right humerus, unspecified fracture morphology, initial encounter  Closed fracture of multiple ribs of right side, initial encounter  Atrial fibrillation, unspecified type ([place])  Ground-level fall    Rx / DC Orders ED Discharge Orders     None         Dorie Rank, MD 04/27/22 2034

## 2022-04-27 NOTE — ED Triage Notes (Signed)
Pt brought to ED via [facility] from [facility], fell out of bed onto right side, c/o right hip and right arm pain. Denies LOC or hitting head.

## 2022-04-27 NOTE — Assessment & Plan Note (Signed)
Nursing home resident. -Resume Aricept.

## 2022-04-27 NOTE — Assessment & Plan Note (Addendum)
Chronic diastolic CHF.  Stable and compensated.  Lasix 20 mg every other day.  Last echo 09/2021, EF of 65 to 70%.  QTc prolonged at 502.  Previously borderline prolonged. -Check magnesium

## 2022-04-27 NOTE — ED Notes (Signed)
Patient transported to x-ray. ?

## 2022-04-28 ENCOUNTER — Encounter (HOSPITAL_COMMUNITY)
Admission: EM | Disposition: A | Discharge status: Skilled Nursing Facility | Payer: Self-pay | Source: Skilled Nursing Facility | Attending: Family Medicine

## 2022-04-28 ENCOUNTER — Inpatient Hospital Stay (HOSPITAL_COMMUNITY): Payer: Medicare Other

## 2022-04-28 ENCOUNTER — Inpatient Hospital Stay (HOSPITAL_COMMUNITY): Payer: Medicare Other | Admitting: Anesthesiology

## 2022-04-28 ENCOUNTER — Other Ambulatory Visit: Payer: Self-pay

## 2022-04-28 ENCOUNTER — Encounter (HOSPITAL_COMMUNITY): Payer: Self-pay | Admitting: Internal Medicine

## 2022-04-28 DIAGNOSIS — I110 Hypertensive heart disease with heart failure: Secondary | ICD-10-CM

## 2022-04-28 DIAGNOSIS — S72001A Fracture of unspecified part of neck of right femur, initial encounter for closed fracture: Secondary | ICD-10-CM | POA: Diagnosis not present

## 2022-04-28 DIAGNOSIS — S72141A Displaced intertrochanteric fracture of right femur, initial encounter for closed fracture: Secondary | ICD-10-CM

## 2022-04-28 DIAGNOSIS — I5032 Chronic diastolic (congestive) heart failure: Secondary | ICD-10-CM

## 2022-04-28 DIAGNOSIS — I509 Heart failure, unspecified: Secondary | ICD-10-CM

## 2022-04-28 DIAGNOSIS — E1165 Type 2 diabetes mellitus with hyperglycemia: Secondary | ICD-10-CM

## 2022-04-28 DIAGNOSIS — I130 Hypertensive heart and chronic kidney disease with heart failure and stage 1 through stage 4 chronic kidney disease, or unspecified chronic kidney disease: Secondary | ICD-10-CM

## 2022-04-28 DIAGNOSIS — I2510 Atherosclerotic heart disease of native coronary artery without angina pectoris: Secondary | ICD-10-CM

## 2022-04-28 DIAGNOSIS — N189 Chronic kidney disease, unspecified: Secondary | ICD-10-CM

## 2022-04-28 DIAGNOSIS — E1122 Type 2 diabetes mellitus with diabetic chronic kidney disease: Secondary | ICD-10-CM

## 2022-04-28 HISTORY — PX: INTRAMEDULLARY (IM) NAIL INTERTROCHANTERIC: SHX5875

## 2022-04-28 LAB — GLUCOSE, CAPILLARY
Glucose-Capillary: 343 mg/dL — ABNORMAL HIGH (ref 70–99)
Glucose-Capillary: 194 mg/dL — ABNORMAL HIGH (ref 70–99)
Glucose-Capillary: 191 mg/dL — ABNORMAL HIGH (ref 70–99)

## 2022-04-28 LAB — CBC
HCT: 31 % — ABNORMAL LOW (ref 36.0–46.0)
Hemoglobin: 10.2 g/dL — ABNORMAL LOW (ref 12.0–15.0)
MCH: 30.4 pg (ref 26.0–34.0)
MCHC: 32.9 g/dL (ref 30.0–36.0)
MCV: 92.3 fL (ref 80.0–100.0)
Platelets: 204 10*3/uL (ref 150–400)
RBC: 3.36 MIL/uL — ABNORMAL LOW (ref 3.87–5.11)
RDW: 13.2 % (ref 11.5–15.5)
WBC: 21.1 10*3/uL — ABNORMAL HIGH (ref 4.0–10.5)
nRBC: 0 % (ref 0.0–0.2)

## 2022-04-28 LAB — BASIC METABOLIC PANEL
Anion gap: 5 (ref 5–15)
BUN: 29 mg/dL — ABNORMAL HIGH (ref 8–23)
CO2: 20 mmol/L — ABNORMAL LOW (ref 22–32)
Calcium: 6.4 mg/dL — CL (ref 8.9–10.3)
Chloride: 115 mmol/L — ABNORMAL HIGH (ref 98–111)
Creatinine, Ser: 1.34 mg/dL — ABNORMAL HIGH (ref 0.44–1.00)
GFR, Estimated: 38 mL/min — ABNORMAL LOW (ref >60)
Glucose, Bld: 304 mg/dL — ABNORMAL HIGH (ref 70–99)
Potassium: 3.6 mmol/L (ref 3.5–5.1)
Sodium: 140 mmol/L (ref 135–145)

## 2022-04-28 LAB — HEMOGLOBIN A1C
Hgb A1c MFr Bld: 8.2 % — ABNORMAL HIGH (ref 4.8–5.6)
Mean Plasma Glucose: 188.64 mg/dL

## 2022-04-28 SURGERY — FIXATION, FRACTURE, INTERTROCHANTERIC, WITH INTRAMEDULLARY ROD
Anesthesia: General | Site: Hip | Laterality: Right

## 2022-04-28 MED ORDER — FENTANYL CITRATE (PF) 100 MCG/2ML IJ SOLN
INTRAMUSCULAR | Status: DC | PRN
Start: 2022-04-28 — End: 2022-04-28
  Administered 2022-04-28 (×2): 50 ug via INTRAVENOUS

## 2022-04-28 MED ORDER — ONDANSETRON HCL 4 MG PO TABS: 4.0000 mg | ORAL_TABLET | Freq: Four times a day (QID) | ORAL | Status: DC | PRN

## 2022-04-28 MED ORDER — CEFAZOLIN SODIUM-DEXTROSE 2-4 GM/100ML-% IV SOLN: 2.0000 g | Freq: Once | INTRAVENOUS | Status: DC

## 2022-04-28 MED ORDER — CEFAZOLIN SODIUM-DEXTROSE 2-4 GM/100ML-% IV SOLN
2.0000 g | INTRAVENOUS | Status: AC
  Administered 2022-04-28: 2 g via INTRAVENOUS

## 2022-04-28 MED ORDER — LIDOCAINE 2% (20 MG/ML) 5 ML SYRINGE
INTRAMUSCULAR | Status: AC
  Filled 2022-04-28: qty 5

## 2022-04-28 MED ORDER — SUGAMMADEX SODIUM 200 MG/2ML IV SOLN
INTRAVENOUS | Status: DC | PRN
  Administered 2022-04-28: 120 mg via INTRAVENOUS

## 2022-04-28 MED ORDER — ESMOLOL HCL 100 MG/10ML IV SOLN
INTRAVENOUS | Status: AC
  Filled 2022-04-28: qty 10

## 2022-04-28 MED ORDER — FENTANYL CITRATE (PF) 100 MCG/2ML IJ SOLN
25.0000 ug | INTRAMUSCULAR | Status: DC | PRN
  Administered 2022-04-28: 25 ug via INTRAVENOUS

## 2022-04-28 MED ORDER — SODIUM CHLORIDE 0.9 % IV SOLN: INTRAVENOUS | Status: DC

## 2022-04-28 MED ORDER — CALCIUM GLUCONATE-NACL 2-0.675 GM/100ML-% IV SOLN
2.0000 g | Freq: Once | INTRAVENOUS | Status: DC
  Filled 2022-04-28: qty 100

## 2022-04-28 MED ORDER — METOPROLOL TARTRATE 5 MG/5ML IV SOLN
2.5000 mg | Freq: Four times a day (QID) | INTRAVENOUS | Status: AC | PRN
  Administered 2022-04-29 (×2): 2.5 mg via INTRAVENOUS
  Filled 2022-04-28 (×2): qty 5

## 2022-04-28 MED ORDER — LACTATED RINGERS IV SOLN: INTRAVENOUS | Status: DC

## 2022-04-28 MED ORDER — PHENYLEPHRINE 80 MCG/ML (10ML) SYRINGE FOR IV PUSH (FOR BLOOD PRESSURE SUPPORT)
PREFILLED_SYRINGE | INTRAVENOUS | Status: AC
  Filled 2022-04-28: qty 10

## 2022-04-28 MED ORDER — CHLORHEXIDINE GLUCONATE 4 % EX LIQD: 60.0000 mL | Freq: Once | CUTANEOUS | Status: DC

## 2022-04-28 MED ORDER — POVIDONE-IODINE 10 % EX SWAB: 2.0000 | Freq: Once | CUTANEOUS | Status: DC

## 2022-04-28 MED ORDER — ROCURONIUM BROMIDE 10 MG/ML (PF) SYRINGE
PREFILLED_SYRINGE | INTRAVENOUS | Status: DC | PRN
  Administered 2022-04-28: 50 mg via INTRAVENOUS

## 2022-04-28 MED ORDER — TRANEXAMIC ACID-NACL 1000-0.7 MG/100ML-% IV SOLN
1000.0000 mg | Freq: Once | INTRAVENOUS | Status: AC
  Administered 2022-04-28: 1000 mg via INTRAVENOUS
  Filled 2022-04-28: qty 100

## 2022-04-28 MED ORDER — 0.9 % SODIUM CHLORIDE (POUR BTL) OPTIME
TOPICAL | Status: DC | PRN
  Administered 2022-04-28: 1000 mL

## 2022-04-28 MED ORDER — ORAL CARE MOUTH RINSE: 15.0000 mL | Freq: Once | OROMUCOSAL | Status: AC

## 2022-04-28 MED ORDER — METOPROLOL TARTRATE 5 MG/5ML IV SOLN
2.0000 mg | Freq: Once | INTRAVENOUS | Status: AC
  Administered 2022-04-28: 2 mg via INTRAVENOUS

## 2022-04-28 MED ORDER — PROPOFOL 10 MG/ML IV BOLUS
INTRAVENOUS | Status: AC
  Filled 2022-04-28: qty 20

## 2022-04-28 MED ORDER — CALCIUM GLUCONATE-NACL 1-0.675 GM/50ML-% IV SOLN
1.0000 g | INTRAVENOUS | Status: AC
  Administered 2022-04-28: 1000 mg via INTRAVENOUS
  Filled 2022-04-28: qty 50

## 2022-04-28 MED ORDER — AMISULPRIDE (ANTIEMETIC) 5 MG/2ML IV SOLN: 5.0000 mg | Freq: Once | INTRAVENOUS | Status: DC | PRN

## 2022-04-28 MED ORDER — ONDANSETRON HCL 4 MG/2ML IJ SOLN
INTRAMUSCULAR | Status: DC | PRN
  Administered 2022-04-28: 4 mg via INTRAVENOUS

## 2022-04-28 MED ORDER — PHENYLEPHRINE 80 MCG/ML (10ML) SYRINGE FOR IV PUSH (FOR BLOOD PRESSURE SUPPORT)
PREFILLED_SYRINGE | INTRAVENOUS | Status: DC | PRN
  Administered 2022-04-28 (×2): 80 ug via INTRAVENOUS
  Administered 2022-04-28: 40 ug via INTRAVENOUS
  Administered 2022-04-28 (×2): 240 ug via INTRAVENOUS
  Administered 2022-04-28: 200 ug via INTRAVENOUS

## 2022-04-28 MED ORDER — METOPROLOL TARTRATE 5 MG/5ML IV SOLN
INTRAVENOUS | Status: AC
  Filled 2022-04-28: qty 5

## 2022-04-28 MED ORDER — CEFAZOLIN SODIUM-DEXTROSE 2-4 GM/100ML-% IV SOLN
2.0000 g | Freq: Three times a day (TID) | INTRAVENOUS | Status: AC
  Administered 2022-04-29 (×2): 2 g via INTRAVENOUS
  Filled 2022-04-28 (×3): qty 100

## 2022-04-28 MED ORDER — LIDOCAINE 2% (20 MG/ML) 5 ML SYRINGE
INTRAMUSCULAR | Status: DC | PRN
  Administered 2022-04-28: 60 mg via INTRAVENOUS

## 2022-04-28 MED ORDER — FENTANYL CITRATE (PF) 100 MCG/2ML IJ SOLN: 25.0000 ug | INTRAMUSCULAR | Status: DC | PRN

## 2022-04-28 MED ORDER — FENTANYL CITRATE (PF) 100 MCG/2ML IJ SOLN
INTRAMUSCULAR | Status: AC
  Filled 2022-04-28: qty 2

## 2022-04-28 MED ORDER — METHOCARBAMOL 1000 MG/10ML IJ SOLN: 500.0000 mg | Freq: Four times a day (QID) | INTRAVENOUS | Status: DC | PRN

## 2022-04-28 MED ORDER — ACETAMINOPHEN 325 MG PO TABS
325.0000 mg | ORAL_TABLET | Freq: Four times a day (QID) | ORAL | Status: DC | PRN
  Filled 2022-04-28: qty 2

## 2022-04-28 MED ORDER — HYDROCODONE-ACETAMINOPHEN 5-325 MG PO TABS
1.0000 | ORAL_TABLET | ORAL | Status: DC | PRN
  Administered 2022-04-28: 1 via ORAL
  Filled 2022-04-28 (×2): qty 1

## 2022-04-28 MED ORDER — INSULIN ASPART 100 UNIT/ML IJ SOLN
10.0000 [IU] | Freq: Once | INTRAMUSCULAR | Status: AC
Start: 2022-04-28 — End: 2022-04-28

## 2022-04-28 MED ORDER — INSULIN ASPART 100 UNIT/ML IJ SOLN
INTRAMUSCULAR | Status: AC
  Administered 2022-04-28: 10 [IU] via SUBCUTANEOUS
  Filled 2022-04-28: qty 1

## 2022-04-28 MED ORDER — CHLORHEXIDINE GLUCONATE 0.12 % MT SOLN
15.0000 mL | Freq: Once | OROMUCOSAL | Status: AC
  Administered 2022-04-28: 15 mL via OROMUCOSAL

## 2022-04-28 MED ORDER — MORPHINE SULFATE (PF) 2 MG/ML IV SOLN
0.5000 mg | INTRAVENOUS | Status: DC | PRN
  Administered 2022-04-30 – 2022-05-01 (×2): 1 mg via INTRAVENOUS
  Filled 2022-04-28 (×2): qty 1

## 2022-04-28 MED ORDER — CEFAZOLIN SODIUM-DEXTROSE 2-4 GM/100ML-% IV SOLN
INTRAVENOUS | Status: AC
  Administered 2022-04-29: 2 g via INTRAVENOUS
  Filled 2022-04-28: qty 100

## 2022-04-28 MED ORDER — METOCLOPRAMIDE HCL 5 MG PO TABS: 5.0000 mg | ORAL_TABLET | Freq: Three times a day (TID) | ORAL | Status: DC | PRN

## 2022-04-28 MED ORDER — ONDANSETRON HCL 4 MG/2ML IJ SOLN
INTRAMUSCULAR | Status: AC
  Filled 2022-04-28: qty 2

## 2022-04-28 MED ORDER — FENTANYL CITRATE (PF) 250 MCG/5ML IJ SOLN
INTRAMUSCULAR | Status: AC
  Filled 2022-04-28: qty 5

## 2022-04-28 MED ORDER — ROCURONIUM BROMIDE 10 MG/ML (PF) SYRINGE
PREFILLED_SYRINGE | INTRAVENOUS | Status: AC
Start: 2022-04-28 — End: ?
  Filled 2022-04-28: qty 10

## 2022-04-28 MED ORDER — DOCUSATE SODIUM 100 MG PO CAPS
100.0000 mg | ORAL_CAPSULE | Freq: Two times a day (BID) | ORAL | Status: DC
  Administered 2022-04-28 – 2022-05-01 (×3): 100 mg via ORAL
  Filled 2022-04-28 (×6): qty 1

## 2022-04-28 MED ORDER — PROPOFOL 10 MG/ML IV BOLUS
INTRAVENOUS | Status: DC | PRN
  Administered 2022-04-28 (×2): 40 mg via INTRAVENOUS

## 2022-04-28 MED ORDER — INSULIN ASPART 100 UNIT/ML IJ SOLN
0.0000 [IU] | INTRAMUSCULAR | Status: DC | PRN
  Administered 2022-04-28: 2 [IU] via SUBCUTANEOUS
  Filled 2022-04-28: qty 1

## 2022-04-28 MED ORDER — METHOCARBAMOL 500 MG PO TABS
500.0000 mg | ORAL_TABLET | Freq: Four times a day (QID) | ORAL | Status: DC | PRN
  Administered 2022-04-28 – 2022-05-01 (×5): 500 mg via ORAL
  Filled 2022-04-28 (×5): qty 1

## 2022-04-28 MED ORDER — METOCLOPRAMIDE HCL 5 MG/ML IJ SOLN: 5.0000 mg | Freq: Three times a day (TID) | INTRAMUSCULAR | Status: DC | PRN

## 2022-04-28 MED ORDER — HYDROCODONE-ACETAMINOPHEN 7.5-325 MG PO TABS: 1.0000 | ORAL_TABLET | ORAL | Status: DC | PRN

## 2022-04-28 MED ORDER — ONDANSETRON HCL 4 MG/2ML IJ SOLN: 4.0000 mg | Freq: Four times a day (QID) | INTRAMUSCULAR | Status: DC | PRN

## 2022-04-28 SURGICAL SUPPLY — 54 items
ADH SKN CLS APL DERMABOND .7 (GAUZE/BANDAGES/DRESSINGS) ×1
APL PRP STRL LF DISP 70% ISPRP (MISCELLANEOUS) ×1
BAG COUNTER SPONGE SURGICOUNT (BAG) IMPLANT
BAG SPNG CNTER NS LX DISP (BAG)
BIT DRILL INTERTAN LAG SCREW (BIT) ×1 IMPLANT
BIT DRILL SHORT 4.0 (BIT) IMPLANT
BRUSH SCRUB EZ PLAIN DRY (MISCELLANEOUS) ×3 IMPLANT
CHLORAPREP W/TINT 26 (MISCELLANEOUS) ×2 IMPLANT
COVER PERINEAL POST (MISCELLANEOUS) ×2 IMPLANT
COVER SURGICAL LIGHT HANDLE (MISCELLANEOUS) ×2 IMPLANT
DERMABOND ADVANCED (GAUZE/BANDAGES/DRESSINGS) ×2
DERMABOND ADVANCED .7 DNX12 (GAUZE/BANDAGES/DRESSINGS) ×1 IMPLANT
DRAPE C-ARM 35X43 STRL (DRAPES) ×2 IMPLANT
DRAPE IMP U-DRAPE 54X76 (DRAPES) ×4 IMPLANT
DRAPE INCISE IOBAN 66X45 STRL (DRAPES) ×1 IMPLANT
DRAPE STERI IOBAN 125X83 (DRAPES) ×2 IMPLANT
DRAPE SURG 17X23 STRL (DRAPES) ×4 IMPLANT
DRAPE U-SHAPE 47X51 STRL (DRAPES) ×2 IMPLANT
DRESSING MEPILEX FLEX 4X4 (GAUZE/BANDAGES/DRESSINGS) IMPLANT
DRILL BIT SHORT 4.0 (BIT) ×2
DRSG MEPILEX BORDER 4X4 (GAUZE/BANDAGES/DRESSINGS) ×2 IMPLANT
DRSG MEPILEX BORDER 4X8 (GAUZE/BANDAGES/DRESSINGS) ×2 IMPLANT
DRSG MEPILEX FLEX 4X4 (GAUZE/BANDAGES/DRESSINGS) ×6
ELECT REM PT RETURN 9FT ADLT (ELECTROSURGICAL) ×2
ELECTRODE REM PT RTRN 9FT ADLT (ELECTROSURGICAL) ×1 IMPLANT
GLOVE BIO SURGEON STRL SZ 6.5 (GLOVE) ×3 IMPLANT
GLOVE BIO SURGEON STRL SZ7.5 (GLOVE) ×6 IMPLANT
GLOVE BIOGEL PI IND STRL 6.5 (GLOVE) ×1 IMPLANT
GLOVE BIOGEL PI IND STRL 7.5 (GLOVE) ×1 IMPLANT
GLOVE BIOGEL PI INDICATOR 6.5 (GLOVE)
GLOVE BIOGEL PI INDICATOR 7.5 (GLOVE) ×2
GOWN STRL REUS W/ TWL LRG LVL3 (GOWN DISPOSABLE) ×1 IMPLANT
GOWN STRL REUS W/TWL LRG LVL3 (GOWN DISPOSABLE) ×4
GUIDE PIN 3.2X343 (PIN) ×2
GUIDE PIN 3.2X343MM (PIN) ×4
GUIDE ROD 3.0 (MISCELLANEOUS) ×2
KIT BASIN OR (CUSTOM PROCEDURE TRAY) ×2 IMPLANT
KIT TURNOVER KIT B (KITS) ×2 IMPLANT
MANIFOLD NEPTUNE II (INSTRUMENTS) ×1 IMPLANT
NAIL LOCK CANN 10X340 130D RT (Nail) ×1 IMPLANT
NS IRRIG 1000ML POUR BTL (IV SOLUTION) ×2 IMPLANT
PACK GENERAL/GYN (CUSTOM PROCEDURE TRAY) ×2 IMPLANT
PAD ARMBOARD 7.5X6 YLW CONV (MISCELLANEOUS) ×4 IMPLANT
PIN GUIDE 3.2X343MM (PIN) IMPLANT
ROD GUIDE 3.0 (MISCELLANEOUS) IMPLANT
SCREW LAG COMPR KIT 100/95 (Screw) ×1 IMPLANT
SCREW TRIGEN LOW PROF 5.0X37.5 (Screw) ×1 IMPLANT
SUT MNCRL AB 3-0 PS2 18 (SUTURE) ×2 IMPLANT
SUT VIC AB 0 CT1 27 (SUTURE) ×2
SUT VIC AB 0 CT1 27XBRD ANBCTR (SUTURE) IMPLANT
SUT VIC AB 2-0 CT1 27 (SUTURE) ×2
SUT VIC AB 2-0 CT1 TAPERPNT 27 (SUTURE) ×2 IMPLANT
TOWEL GREEN STERILE (TOWEL DISPOSABLE) ×4 IMPLANT
WATER STERILE IRR 1000ML POUR (IV SOLUTION) ×2 IMPLANT

## 2022-04-28 NOTE — Op Note (Signed)
Orthopaedic Surgery Operative Note (CSN: [id number] ) Date of Surgery: 04/28/2022  Admit Date: 04/27/2022   Diagnoses: Pre-Op Diagnoses: Right intertrochanteric femur fracture  Post-Op Diagnosis: Same  Procedures: CPT 27245-Cephalomedullary nailing of right intertrochanteric femur fracture  Surgeons : Primary: Shona Needles, MD  Assistant: None  Location: OR 3   Anesthesia: General   Antibiotics: Ancef 2g preop   Tourniquet time: None    Estimated Blood WPVX:48 mL  Complications:None  Specimens:None   Implants: Implant Name Type Inv. Item Serial No. Manufacturer Lot No. LRB No. Used Action  SCREW LAG COMPR KIT 100/95 - [id number] Screw SCREW LAG COMPR KIT 100/95  [employer] [id number] Right 1 Implanted  NAIL LOCK CANN 10X340 130D RT - [id number] Nail NAIL LOCK CANN 10X340 130D RT  [employer] [id number] Right 1 Implanted  SCREW TRIGEN LOW PROF 5.0X37.5 - [id number] Screw SCREW TRIGEN LOW PROF 5.0X37.5  [employer] [id number] Right 1 Implanted     Indications for Surgery: 88 year old female who sustained a ground-level fall with a right intertrochanteric femur fracture and a right proximal humerus fracture.  Due to the unstable nature of her right hip I recommended proceeding with cephalomedullary nailing.  Risks and benefits were discussed with the and her son.  Risks include but not limited to bleeding, infection, malunion, nonunion, hardware failure, hardware irritation, nerve or blood vessel injury, DVT, even the possibility of anesthetic complications.  They agreed to proceed with surgery and consent was obtained.  Operative Findings: Cephalomedullary nailing right intertrochanteric femur fracture with Tamala [employer] InterTAN 10 x 340 mm nail  Procedure: The patient was identified in the preoperative holding area. Consent was confirmed with the patient and their family and all questions were answered. The operative  extremity was marked after confirmation with the patient. she was then brought back to the operating room by our anesthesia colleagues.  She was placed under general anesthetic and carefully transferred over to a Hana table.  All bony prominences were well-padded.  The right lower extremity was reduced using traction.  Fluoroscopic imaging showed adequate reduction.  The right lower extremity was then prepped and draped in usual sterile fashion.  A timeout was performed to verify the patient, the procedure, and the extremity.  Preoperative antibiotics were dosed.  Small incision proximal to the greater trochanter was made and carried down through skin and subcutaneous tissue.  A threaded guidewire was placed at the tip of the greater trochanter and advanced into the proximal metaphysis.  Fluoroscopic imaging confirmed the placement of the pin and entry reamer was used to enter the medullary canal.  I then passed a ball-tipped guidewire down the center of the canal and seated into the distal metaphysis.  I then used a 10 mm reamer to ream down the center of the canal.  Minimal chatter was obtained and I placed a 10 x 340 mm nail.  Targeting arm was then used to direct a threaded guidewire into the head neck segment.  I confirmed adequate tip apex distance.  I then drilled the path for the compression screw.  I placed an antirotation bar.  I then drilled the path for the lag screw and placed the lag screw.  I then placed a compression screw and compressed approximately 3 mm.  The nail was then statically locked proximally.  Perfect circle technique was used to place a lateral to medial distal interlocking screw.  Final fluoroscopic imaging was obtained.  The incisions were copiously  irrigated.  A layered closure of 2-0 Vicryl and 3-0 Monocryl was used to close the skin with Dermabond to seal the skin.  Sterile dressing was applied.  The patient was then awoken from anesthesia and taken to the PACU in stable  condition.  Post Op Plan/Instructions: Patient will be weightbearing as tolerated to the right lower extremity.  She will be nonweightbearing to the right upper extremity.  She will receive her Eliquis for DVT prophylaxis.  She will receive postoperative Ancef.  She will mobilize with physical and Occupational Therapy.  I was present and performed the entire surgery.  Patrecia Pace, PA-C did assist me throughout the case. An assistant was necessary given the difficulty in approach, maintenance of reduction and ability to instrument the fracture.   Katha Hamming, MD [facility]

## 2022-04-28 NOTE — Progress Notes (Signed)
  Progress Note   Patient: Laura Parks ZCH:[id number] DOB: [birth year] DOA: 04/27/2022     1 DOS: the patient was seen and examined on 04/28/2022   Brief hospital course: 88 y.o. female with medical history significant for dementia, diabetes mellitus, atrial fibrillation, legally blind, hypertension, stroke, CHF. Presented to ED on 8/13 for fall form bed and c/o R hip and R arm pain. Per medication list from the nursing center at bedside, patient's last dose of Eliquis was this morning.   8/14- Per orthopedic team R Arm-nonoperative, R femur- plan for IM nail today    Assessment and Plan: * Closed right hip fracture ([place]) Status post mechanical fall.  X-ray pelvis- Comminuted displaced fracture is seen in the intertrochanteric portion of proximal right femur.  Per medication list from nursing home, last Xarelto  dose was this morning- 8/13. - EDP to Dr. Doreatha Martin, admit to [facility], n.p.o. midnight. -IV morphine 4 mg as needed - N/s 75cc/hr x 12 hrs  Humerus head fracture, right, closed, initial encounter Status post mechanical fall off from the bed.  Right shoulder x-ray- Comminuted fracture of the right humeral head.  Vascular dementia [facility]) Nursing home resident. -Resume Aricept.  PAF (paroxysmal atrial fibrillation) ([place]) Heart rates up to 105.  On anticoagulation with Xarelto.  Last dose of Xarelto was this morning.  Last echo 09/2021 EF of 65 to 70%.  -Hold Xarelto -Resume Cardizem -Will add as needed IV metoprolol 5 mg for heart rate > 120  CHF (congestive heart failure) (H[facility]) Chronic diastolic CHF.  Stable and compensated.  Lasix 20 mg every other day.  Last echo 09/2021, EF of 65 to 70%.  QTc prolonged at 502.  Previously borderline prolonged. -Check magnesium  Type 2 diabetes mellitus ([place]) With hyperglycemia.  Glucose 275. - SSI- q4h - HgbA1c  Essential hypertension, benign Stable. -Resume home diltiazem -Hold Lasix while n.p.o.        Subjective: NAEON,  transfer from [facility] this AM plan for IM nail R femur per orthopedic team today, remains NPO and with pain regimen on board.   Physical Exam: Vitals:   04/28/22 0600 04/28/22 0648 04/28/22 0736 04/28/22 0936  BP: (!) 120/104 110/82  128/85  Pulse:  (!) 117  (!) 104  Resp: '12 17  18  '$ Temp:   98 F (36.7 C) 98.7 F (37.1 C)  TempSrc:    Oral  SpO2:  96%  92%  Weight:    63.5 kg  Height:    5' (1.524 m)  Eyes: Pupils equal, lids and conjunctivae normal ENMT: Mucous membranes are mildly dry.  Neck: normal, supple, no masses, no thyromegaly Respiratory: clear to auscultation bilaterally, no wheezing, no crackles. Normal respiratory effort. No accessory muscle use.  Cardiovascular: Regular rate and rhythm, no murmurs / rubs / gallops. No extremity edema.  Abdomen: no tenderness, no masses palpated. No hepatosplenomegaly Musculoskeletal: no clubbing / cyanosis.   Skin: no rashes, lesions, ulcers. No induration Neurologic: Exam limited by patient's dementia, no apparent cranial nerve abnormality . Psychiatric: Awake, alert, not answering questions.   Data Reviewed:  There are no new results to review at this time.  Family Communication: none available, primary contact son  Disposition: Status is: Inpatient Remains inpatient appropriate because: orthopedic repair, recovery  Planned Discharge Destination: pending, likely Str or back to nursing facility with PT    Time spent: 25 minutes  Author: Vanna Scotland, MD 04/28/2022 11:07 AM  For on call review [email or web address].

## 2022-04-28 NOTE — H&P (View-Only) (Signed)
Reason for Consult:Right hip and humerus fxs Referring Physician: Skipper Cliche Time called: [id number] Time at bedside: [place] Gist is an 88 y.o. female.  HPI: Willis fell out of bed onto her right side while at the SNF where she resides. She had immediate right arm and hip pain. She was brought to [facility] where workup showed proximal humerus and hip fractures and orthopedic surgery was consulted. She was transferred to [facility] for definitive care.  Past Medical History:  Diagnosis Date   Anemia    Cognitive impairment    Colon cancer ([place])    Coronary artery disease    Minimal coronary atherosclerosis at cardiac catheterization April 2017   Essential hypertension, benign    History of pneumonia    Iron deficiency anemia    Erosive antral gastritis   Legal blindness    Macular degeneration    Mixed hyperlipidemia    PAF (paroxysmal atrial fibrillation) ([place])    Stroke ([place]) 01/2016   [place] infract (pt has had 4 strokes per daughter)   Type 2 diabetes mellitus ([place])     Past Surgical History:  Procedure Laterality Date   APPENDECTOMY     CARDIAC CATHETERIZATION N/A 01/04/2016   Procedure: Left Heart Cath and Coronary Angiography;  Surgeon: Leonie Man, MD;  Location: [place] [facility];  Service: Cardiovascular;  Laterality: N/A;   CATARACT EXTRACTION     CHOLECYSTECTOMY     Colon surgery for colon cancer     1992 by Dr Mendel Ryder   COLONOSCOPY  03/24/2012   Procedure: COLONOSCOPY;  Surgeon: Rogene Houston, MD;  [facility];  Service: Endoscopy;  Laterality: N/A;  1200   COLONOSCOPY N/A 07/03/2016   Procedure: COLONOSCOPY;  Surgeon: Rogene Houston, MD;  [facility];  Service: Endoscopy;  Laterality: N/A;  200   ESOPHAGOGASTRODUODENOSCOPY  08/04/2012   Procedure: ESOPHAGOGASTRODUODENOSCOPY (EGD);  Surgeon: Rogene Houston, MD;  [facility];  Service: Endoscopy;  Laterality: N/A;  325   INTRAMEDULLARY (IM) NAIL INTERTROCHANTERIC  Left 06/09/2019   Procedure: INTRAMEDULLARY (IM) NAIL INTERTROCHANTRIC;  Surgeon: Shona Needles, MD;  Location: [facility];  Service: Orthopedics;  Laterality: Left;   Right knee surgery     TEE WITHOUT CARDIOVERSION N/A 02/01/2016   Procedure: TRANSESOPHAGEAL ECHOCARDIOGRAM (TEE);  Surgeon: Jerline Pain, MD;  Location[facility] END[facility];  Service: Cardiovascular;  Laterality: N/A;   TOTAL HIP ARTHROPLASTY Left    per daughter   TUBAL LIGATION      Family History  Problem Relation Age of Onset   COPD Mother    Alzheimer's disease Mother    Congestive Heart Failure Mother    Diabetes Brother     Social History:  reports that she has never smoked. She has never used smokeless tobacco. She reports that she does not drink alcohol and does not use drugs.  Allergies:  Allergies  Allergen Reactions   Codeine Nausea Only    Medications: I have reviewed the patient's current medications.  Results for orders placed or performed during the hospital encounter of 04/27/22 (from the past 48 hour(s))  CBC     Status: Abnormal   Collection Time: 04/27/22  7:19 PM  Result Value Ref Range   WBC 15.8 (H) 4.0 - 10.5 K/uL   RBC 4.34 3.87 - 5.11 MIL/uL   Hemoglobin 12.9 12.0 - 15.0 g/dL   HCT 38.9 36.0 - 46.0 %   MCV 89.6 80.0 - 100.0  fL   MCH 29.7 26.0 - 34.0 pg   MCHC 33.2 30.0 - 36.0 g/dL   RDW 13.0 11.5 - 15.5 %   Platelets 177 150 - 400 K/uL   nRBC 0.0 0.0 - 0.2 %    Comment: Performed at [facility], [address], [place], [place] [address]  Basic metabolic panel     Status: Abnormal   Collection Time: 04/27/22  7:19 PM  Result Value Ref Range   Sodium 136 135 - 145 mmol/L   Potassium 4.4 3.5 - 5.1 mmol/L   Chloride 104 98 - 111 mmol/L   CO2 23 22 - 32 mmol/L   Glucose, Bld 275 (H) 70 - 99 mg/dL    Comment: Glucose reference range applies only to samples taken after fasting for at least 8 hours.   BUN 27 (H) 8 - 23 mg/dL   Creatinine, Ser 1.37 (H) 0.44 - 1.00 mg/dL   Calcium 9.1 8.9  - 10.3 mg/dL   GFR, Estimated 37 (L) >60 mL/min    Comment: (NOTE) Calculated using the CKD-EPI Creatinine Equation (2021)    Anion gap 9 5 - 15    Comment: Performed at [facility], [address], [place], [place] [address]  Magnesium     Status: None   Collection Time: 04/27/22  7:19 PM  Result Value Ref Range   Magnesium 1.8 1.7 - 2.4 mg/dL    Comment: Performed at [facility], [address], [place], [place] [address]  Hemoglobin A1c     Status: Abnormal   Collection Time: 04/27/22  7:19 PM  Result Value Ref Range   Hgb A1c MFr Bld 8.2 (H) 4.8 - 5.6 %    Comment: (NOTE) Pre diabetes:          5.7%-6.4%  Diabetes:              >6.4%  Glycemic control for   <7.0% adults with diabetes    Mean Plasma Glucose 188.64 mg/dL    Comment: Performed at [facility] [address] [place], [place] [address]  Basic metabolic panel     Status: Abnormal   Collection Time: 04/28/22  6:40 AM  Result Value Ref Range   Sodium 140 135 - 145 mmol/L   Potassium 3.6 3.5 - 5.1 mmol/L    Comment: DELTA CHECK NOTED   Chloride 115 (H) 98 - 111 mmol/L   CO2 20 (L) 22 - 32 mmol/L   Glucose, Bld 304 (H) 70 - 99 mg/dL    Comment: Glucose reference range applies only to samples taken after fasting for at least 8 hours.   BUN 29 (H) 8 - 23 mg/dL   Creatinine, Ser 1.34 (H) 0.44 - 1.00 mg/dL   Calcium 6.4 (LL) 8.9 - 10.3 mg/dL    Comment: DELTA CHECK NOTED CRITICAL RESULT CALLED TO, READ BACK BY AND VERIFIED WITH: HEATHER EVANS @ 0719 ON 04/28/22 C VARNER    GFR, Estimated 38 (L) >60 mL/min    Comment: (NOTE) Calculated using the CKD-EPI Creatinine Equation (2021)    Anion gap 5 5 - 15    Comment: Performed at [facility], [address], [place], [place] [address]  Glucose, capillary     Status: Abnormal   Collection Time: 04/28/22  9:31 AM  Result Value Ref Range   Glucose-Capillary 343 (H) 70 - 99 mg/dL    Comment: Glucose reference range applies only to samples taken after fasting  for at least 8 hours.  CT Cervical Spine Wo Contrast  Result Date: 04/27/2022 CLINICAL DATA:  Fall out of bed EXAM: CT HEAD WITHOUT CONTRAST CT CERVICAL SPINE WITHOUT CONTRAST TECHNIQUE: Multidetector CT imaging of the head and cervical spine was performed following the standard protocol without intravenous contrast. Multiplanar CT image reconstructions of the cervical spine were also generated. RADIATION DOSE REDUCTION: This exam was performed according to the departmental dose-optimization program which includes automated exposure control, adjustment of the mA and/or kV according to patient size and/or use of iterative reconstruction technique. COMPARISON:  MRI brain Jan 30, 2016. FINDINGS: CT HEAD FINDINGS Brain: Age-related global parenchymal volume loss. Patchy and more confluent subcortical and periventricular white matter hypodensities are nonspecific but are similar prior brain MRI and most consistent with chronic ischemic small vessel microvascular disease. No evidence of acute large vascular territory infarction, hemorrhage, hydrocephalus extra-axial collection or mass lesion/mass effect. Vascular: No hyperdense vessel. Atherosclerotic calcifications of the internal carotid and vertebral arteries at the skull base. Skull: Normal. Negative for fracture or focal lesion. Sinuses/Orbits: Visualized portions of the paranasal sinuses are predominantly clear. Orbits are grossly unremarkable. Other: Mastoid air cells are predominantly clear. CT CERVICAL SPINE FINDINGS Alignment: Straightening of the normal cervical lordosis commonly positional. No evidence of traumatic listhesis. Degenerative grade 1 L3 on L4 anterolisthesis. Skull base and vertebrae: No acute displaced fracture identified. Irregularity of the dens with small anterior erosions and a partially mineralized pannus. Soft tissues and spinal canal: No prevertebral fluid or swelling. No visible canal hematoma. Disc levels: Multilevel degenerative  changes spine with disc space narrowing, osteophyte formation, uncovertebral/facet hypertrophy and ligamentum flavum thickening/mineralization with multifocal neural foraminal and spinal canal narrowing most prominent at C5-C6. Upper chest: No acute abnormality. Other: Carotid artery calcifications. IMPRESSION: 1. No acute intracranial abnormality. 2. Similar age-related global parenchymal volume loss with moderate burden of chronic ischemic small vessel white matter disease. 3. No convincing evidence of acute fracture or traumatic listhesis of the cervical spine. 4. Irregularity of the dens with small anterior erosions and a partially mineralized pannus. 5. Multilevel degenerative change of the spine with multifocal neural foraminal and spinal canal narrowing most prominent at C5-C6. Electronically Signed   By: Dahlia Bailiff M.D.   On: 04/27/2022 19:13   CT Head Wo Contrast  Result Date: 04/27/2022 CLINICAL DATA:  Trauma EXAM: CT HEAD WITHOUT CONTRAST TECHNIQUE: Contiguous axial images were obtained from the base of the skull through the vertex without intravenous contrast. RADIATION DOSE REDUCTION: This exam was performed according to the departmental dose-optimization program which includes automated exposure control, adjustment of the mA and/or kV according to patient size and/or use of iterative reconstruction technique. COMPARISON:  01/30/2016 FINDINGS: Brain: There is patient motion in some of the images. Additional images were repeated. No acute intracranial findings are seen. There are no signs of bleeding within the cranium. Cortical sulci are prominent, more so in both frontal regions. There is decreased density in periventricular white matter. Vascular: Scattered arterial calcifications are seen. Skull: No fracture is seen. Sinuses/Orbits: Unremarkable. Other: None. IMPRESSION: No acute intracranial findings are seen in noncontrast CT brain. Atrophy. Small vessel disease. Electronically Signed   By:  Elmer Picker M.D.   On: 04/27/2022 19:05   DG Chest 1 View  Result Date: 04/27/2022 CLINICAL DATA:  Right-sided pain after fall EXAM: CHEST  1 VIEW COMPARISON:  Radiographs 02/20/2020 FINDINGS: Mildly displaced fractures of the anterolateral right fourth and fifth ribs. Partially visualized comminuted fracture of the right humeral head. Stable cardiomediastinal silhouette.  Mitral annular calcification. Aortic calcification. No focal consolidation, pleural effusion, or pneumothorax. Linear atelectasis in the lingula. Mild pulmonary vascular congestion. IMPRESSION: 1. Mildly displaced fractures of the right anterolateral fourth and fifth ribs. No pneumothorax. 2. Partially visualized comminuted fracture of the proximal right humeral head. 3. Cardiomegaly and pulmonary vascular congestion. Electronically Signed   By: Placido Sou M.D.   On: 04/27/2022 18:57   DG Shoulder Right  Result Date: 04/27/2022 CLINICAL DATA:  Fall out of bed onto right side now with right arm pain EXAM: RIGHT SHOULDER - 2+ VIEW COMPARISON:  Radiographs 02/20/2020 FINDINGS: Acute comminuted mildly displaced fracture of the anatomical neck of the humerus. Mild lateral displacement of the greater tuberosity. Question additional fracture of the lesser tuberosity. No evidence of dislocation. No additional fractures. Soft tissue swelling about the shoulder. IMPRESSION: Comminuted fracture of the right humeral head. Electronically Signed   By: Placido Sou M.D.   On: 04/27/2022 18:52   DG Hip Unilat W or Wo Pelvis 2-3 Views Right  Result Date: 04/27/2022 CLINICAL DATA:  Trauma, fall EXAM: DG HIP (WITH OR WITHOUT PELVIS) 2-3V RIGHT COMPARISON:  None Available. FINDINGS: There is comminuted intertrochanteric fracture of proximal right femur. There is overriding of fracture fragments. There is 2 cm offset in alignment of lateral cortical margins in proximal femur. There is previous internal fixation in left femur. Vascular  calcifications are seen. Degenerative changes are noted in lumbar spine. IMPRESSION: Comminuted displaced fracture is seen in the intertrochanteric portion of proximal right femur. Electronically Signed   By: Elmer Picker M.D.   On: 04/27/2022 18:50    Review of Systems  HENT:  Negative for ear discharge, ear pain, hearing loss and tinnitus.   Eyes:  Negative for photophobia and pain.  Respiratory:  Negative for cough and shortness of breath.   Cardiovascular:  Negative for chest pain.  Gastrointestinal:  Negative for abdominal pain, nausea and vomiting.  Genitourinary:  Negative for dysuria, flank pain, frequency and urgency.  Musculoskeletal:  Positive for arthralgias (Right hip and right arm). Negative for back pain, myalgias and neck pain.  Neurological:  Negative for dizziness and headaches.  Hematological:  Does not bruise/bleed easily.  Psychiatric/Behavioral:  The patient is not nervous/anxious.    Blood pressure 128/85, pulse (!) 104, temperature 98.7 F (37.1 C), temperature source Oral, resp. rate 18, height 5' (1.524 m), weight 63.5 kg, SpO2 92 %. Physical Exam Constitutional:      General: She is not in acute distress.    Appearance: She is well-developed. She is not diaphoretic.  HENT:     Head: Normocephalic and atraumatic.  Eyes:     General: No scleral icterus.       Right eye: No discharge.        Left eye: No discharge.     Conjunctiva/sclera: Conjunctivae normal.  Cardiovascular:     Rate and Rhythm: Normal rate and regular rhythm.  Pulmonary:     Effort: Pulmonary effort is normal. No respiratory distress.  Musculoskeletal:     Cervical back: Normal range of motion.     Comments: Right shoulder, elbow, wrist, digits- no skin wounds, TTP shoulder, sling in place, no instability, no blocks to motion  Sens  Ax/R/M/U intact  Mot   Ax/ R/ PIN/ M/ AIN/ U intact  Rad 2+  RLE No traumatic wounds, ecchymosis, or rash  Mild hip TTP  No knee or ankle  effusion  Knee stable to varus/ valgus and anterior/posterior stress  Sens DPN, SPN, TN intact  Motor EHL, ext, flex, evers 5/5  DP 1+, PT 0, No significant edema  Skin:    General: Skin is warm and dry.  Neurological:     Mental Status: She is alert.  Psychiatric:        Mood and Affect: Mood normal.        Behavior: Behavior normal.     Assessment/Plan: Right hip fx -- Plan IMN today with Dr. Doreatha Martin. Please keep NPO. Right humerus fx -- Plan non-operative management with sling and NWB.    Lisette Abu, PA-C Orthopedic Surgery [phone] 04/28/2022, 10:37 AM

## 2022-04-28 NOTE — Plan of Care (Signed)

## 2022-04-28 NOTE — ED Notes (Signed)
Date and time results received: 04/28/22 7:26 AM (use smartphrase ".now" to insert current time)  Test: calcium Critical Value: 6.4  Name of Provider Notified: [email or web address]  Orders Received? Or Actions Taken?:  none

## 2022-04-28 NOTE — ED Notes (Signed)
Calcium administered by [facility]. Started during transfer of care.

## 2022-04-28 NOTE — Consult Note (Signed)
Reason for Consult:Right hip and humerus fxs Referring Physician: Skipper Cliche Time called: [id number] Time at bedside: Laura Parks is an 88 y.o. female.  HPI: [name] fell out of bed onto her right side while at the SNF where she resides. She had immediate right arm and hip pain. She was brought to [facility] where workup showed proximal humerus and hip fractures and orthopedic surgery was consulted. She was transferred to [facility] for definitive care.  Past Medical History:  Diagnosis Date   Anemia    Cognitive impairment    Colon cancer ([place])    Coronary artery disease    Minimal coronary atherosclerosis at cardiac catheterization April 2017   Essential hypertension, benign    History of pneumonia    Iron deficiency anemia    Erosive antral gastritis   Legal blindness    Macular degeneration    Mixed hyperlipidemia    PAF (paroxysmal atrial fibrillation) ([place])    Stroke ([place]) 01/2016   [place] infract (pt has had 4 strokes per daughter)   Type 2 diabetes mellitus (Laura Parks)     Past Surgical History:  Procedure Laterality Date   APPENDECTOMY     CARDIAC CATHETERIZATION N/A 01/04/2016   Procedure: Left Heart Cath and Coronary Angiography;  Surgeon: Leonie Man, MD;  Location: [place] [facility];  Service: Cardiovascular;  Laterality: N/A;   CATARACT EXTRACTION     CHOLECYSTECTOMY     Colon surgery for colon cancer     1992 by Dr Mendel Ryder   COLONOSCOPY  03/24/2012   Procedure: COLONOSCOPY;  Surgeon: Rogene Houston, MD;  [facility];  Service: Endoscopy;  Laterality: N/A;  1200   COLONOSCOPY N/A 07/03/2016   Procedure: COLONOSCOPY;  Surgeon: Rogene Houston, MD;  [facility];  Service: Endoscopy;  Laterality: N/A;  200   ESOPHAGOGASTRODUODENOSCOPY  08/04/2012   Procedure: ESOPHAGOGASTRODUODENOSCOPY (EGD);  Surgeon: Rogene Houston, MD;  [facility];  Service: Endoscopy;  Laterality: N/A;  325   INTRAMEDULLARY (IM) NAIL INTERTROCHANTERIC  Left 06/09/2019   Procedure: INTRAMEDULLARY (IM) NAIL INTERTROCHANTRIC;  Surgeon: Shona Needles, MD;  Location: [place];  Service: Orthopedics;  Laterality: Left;   Right knee surgery     TEE WITHOUT CARDIOVERSION N/A 02/01/2016   Procedure: TRANSESOPHAGEAL ECHOCARDIOGRAM (TEE);  Surgeon: Jerline Pain, MD;  Location: [facility] ENDOSCOPY;  Service: Cardiovascular;  Laterality: N/A;   TOTAL HIP ARTHROPLASTY Left    per daughter   TUBAL LIGATION      Family History  Problem Relation Age of Onset   COPD Mother    Alzheimer's disease Mother    Congestive Heart Failure Mother    Diabetes Brother     Social History:  reports that she has never smoked. She has never used smokeless tobacco. She reports that she does not drink alcohol and does not use drugs.  Allergies:  Allergies  Allergen Reactions   Codeine Nausea Only    Medications: I have reviewed the patient's current medications.  Results for orders placed or performed during the hospital encounter of 04/27/22 (from the past 48 hour(s))  CBC     Status: Abnormal   Collection Time: 04/27/22  7:19 PM  Result Value Ref Range   WBC 15.8 (H) 4.0 - 10.5 K/uL   RBC 4.34 3.87 - 5.11 MIL/uL   Hemoglobin 12.9 12.0 - 15.0 g/dL   HCT 38.9 36.0 - 46.0 %   MCV 89.6 80.0 - 100.0  fL   MCH 29.7 26.0 - 34.0 pg   MCHC 33.2 30.0 - 36.0 g/dL   RDW 13.0 11.5 - 15.5 %   Platelets 177 150 - 400 K/uL   nRBC 0.0 0.0 - 0.2 %    Comment: Performed at [facility], [address] [place], [place] [address]  Basic metabolic panel     Status: Abnormal   Collection Time: 04/27/22  7:19 PM  Result Value Ref Range   Sodium 136 135 - 145 mmol/L   Potassium 4.4 3.5 - 5.1 mmol/L   Chloride 104 98 - 111 mmol/L   CO2 23 22 - 32 mmol/L   Glucose, Bld 275 (H) 70 - 99 mg/dL    Comment: Glucose reference range applies only to samples taken after fasting for at least 8 hours.   BUN 27 (H) 8 - 23 mg/dL   Creatinine, Ser 1.37 (H) 0.44 - 1.00 mg/dL   Calcium 9.1 8.9  - 10.3 mg/dL   GFR, Estimated 37 (L) >60 mL/min    Comment: (NOTE) Calculated using the CKD-EPI Creatinine Equation (2021)    Anion gap 9 5 - 15    Comment: Performed at [facility], [address], [place], [place] [address]  Magnesium     Status: None   Collection Time: 04/27/22  7:19 PM  Result Value Ref Range   Magnesium 1.8 1.7 - 2.4 mg/dL    Comment: Performed at [facility], [address], [place], [place] [address]  Hemoglobin A1c     Status: Abnormal   Collection Time: 04/27/22  7:19 PM  Result Value Ref Range   Hgb A1c MFr Bld 8.2 (H) 4.8 - 5.6 %    Comment: (NOTE) Pre diabetes:          5.7%-6.4%  Diabetes:              >6.4%  Glycemic control for   <7.0% adults with diabetes    Mean Plasma Glucose 188.64 mg/dL    Comment: Performed at [facility] [address] [place], [place] [address]  Basic metabolic panel     Status: Abnormal   Collection Time: 04/28/22  6:40 AM  Result Value Ref Range   Sodium 140 135 - 145 mmol/L   Potassium 3.6 3.5 - 5.1 mmol/L    Comment: DELTA CHECK NOTED   Chloride 115 (H) 98 - 111 mmol/L   CO2 20 (L) 22 - 32 mmol/L   Glucose, Bld 304 (H) 70 - 99 mg/dL    Comment: Glucose reference range applies only to samples taken after fasting for at least 8 hours.   BUN 29 (H) 8 - 23 mg/dL   Creatinine, Ser 1.34 (H) 0.44 - 1.00 mg/dL   Calcium 6.4 (LL) 8.9 - 10.3 mg/dL    Comment: DELTA CHECK NOTED CRITICAL RESULT CALLED TO, READ BACK BY AND VERIFIED WITH: HEATHER EVANS @ 0719 ON 04/28/22 C VARNER    GFR, Estimated 38 (L) >60 mL/min    Comment: (NOTE) Calculated using the CKD-EPI Creatinine Equation (2021)    Anion gap 5 5 - 15    Comment: Performed at [facility], [address] [place], [place] [address]  Glucose, capillary     Status: Abnormal   Collection Time: 04/28/22  9:31 AM  Result Value Ref Range   Glucose-Capillary 343 (H) 70 - 99 mg/dL    Comment: Glucose reference range applies only to samples taken after fasting  for at least 8 hours.  CT Cervical Spine Wo Contrast  Result Date: 04/27/2022 CLINICAL DATA:  Fall out of bed EXAM: CT HEAD WITHOUT CONTRAST CT CERVICAL SPINE WITHOUT CONTRAST TECHNIQUE: Multidetector CT imaging of the head and cervical spine was performed following the standard protocol without intravenous contrast. Multiplanar CT image reconstructions of the cervical spine were also generated. RADIATION DOSE REDUCTION: This exam was performed according to the departmental dose-optimization program which includes automated exposure control, adjustment of the mA and/or kV according to patient size and/or use of iterative reconstruction technique. COMPARISON:  MRI brain Jan 30, 2016. FINDINGS: CT HEAD FINDINGS Brain: Age-related global parenchymal volume loss. Patchy and more confluent subcortical and periventricular white matter hypodensities are nonspecific but are similar prior brain MRI and most consistent with chronic ischemic small vessel microvascular disease. No evidence of acute large vascular territory infarction, hemorrhage, hydrocephalus extra-axial collection or mass lesion/mass effect. Vascular: No hyperdense vessel. Atherosclerotic calcifications of the internal carotid and vertebral arteries at the skull base. Skull: Normal. Negative for fracture or focal lesion. Sinuses/Orbits: Visualized portions of the paranasal sinuses are predominantly clear. Orbits are grossly unremarkable. Other: Mastoid air cells are predominantly clear. CT CERVICAL SPINE FINDINGS Alignment: Straightening of the normal cervical lordosis commonly positional. No evidence of traumatic listhesis. Degenerative grade 1 L3 on L4 anterolisthesis. Skull base and vertebrae: No acute displaced fracture identified. Irregularity of the dens with small anterior erosions and a partially mineralized pannus. Soft tissues and spinal canal: No prevertebral fluid or swelling. No visible canal hematoma. Disc levels: Multilevel degenerative  changes spine with disc space narrowing, osteophyte formation, uncovertebral/facet hypertrophy and ligamentum flavum thickening/mineralization with multifocal neural foraminal and spinal canal narrowing most prominent at C5-C6. Upper chest: No acute abnormality. Other: Carotid artery calcifications. IMPRESSION: 1. No acute intracranial abnormality. 2. Similar age-related global parenchymal volume loss with moderate burden of chronic ischemic small vessel white matter disease. 3. No convincing evidence of acute fracture or traumatic listhesis of the cervical spine. 4. Irregularity of the dens with small anterior erosions and a partially mineralized pannus. 5. Multilevel degenerative change of the spine with multifocal neural foraminal and spinal canal narrowing most prominent at C5-C6. Electronically Signed   By: Dahlia Bailiff M.D.   On: 04/27/2022 19:13   CT Head Wo Contrast  Result Date: 04/27/2022 CLINICAL DATA:  Trauma EXAM: CT HEAD WITHOUT CONTRAST TECHNIQUE: Contiguous axial images were obtained from the base of the skull through the vertex without intravenous contrast. RADIATION DOSE REDUCTION: This exam was performed according to the departmental dose-optimization program which includes automated exposure control, adjustment of the mA and/or kV according to patient size and/or use of iterative reconstruction technique. COMPARISON:  01/30/2016 FINDINGS: Brain: There is patient motion in some of the images. Additional images were repeated. No acute intracranial findings are seen. There are no signs of bleeding within the cranium. Cortical sulci are prominent, more so in both frontal regions. There is decreased density in periventricular white matter. Vascular: Scattered arterial calcifications are seen. Skull: No fracture is seen. Sinuses/Orbits: Unremarkable. Other: None. IMPRESSION: No acute intracranial findings are seen in noncontrast CT brain. Atrophy. Small vessel disease. Electronically Signed   By:  Elmer Picker M.D.   On: 04/27/2022 19:05   DG Chest 1 View  Result Date: 04/27/2022 CLINICAL DATA:  Right-sided pain after fall EXAM: CHEST  1 VIEW COMPARISON:  Radiographs 02/20/2020 FINDINGS: Mildly displaced fractures of the anterolateral right fourth and fifth ribs. Partially visualized comminuted fracture of the right humeral head. Stable cardiomediastinal silhouette.  Mitral annular calcification. Aortic calcification. No focal consolidation, pleural effusion, or pneumothorax. Linear atelectasis in the lingula. Mild pulmonary vascular congestion. IMPRESSION: 1. Mildly displaced fractures of the right anterolateral fourth and fifth ribs. No pneumothorax. 2. Partially visualized comminuted fracture of the proximal right humeral head. 3. Cardiomegaly and pulmonary vascular congestion. Electronically Signed   By: Placido Sou M.D.   On: 04/27/2022 18:57   DG Shoulder Right  Result Date: 04/27/2022 CLINICAL DATA:  Fall out of bed onto right side now with right arm pain EXAM: RIGHT SHOULDER - 2+ VIEW COMPARISON:  Radiographs 02/20/2020 FINDINGS: Acute comminuted mildly displaced fracture of the anatomical neck of the humerus. Mild lateral displacement of the greater tuberosity. Question additional fracture of the lesser tuberosity. No evidence of dislocation. No additional fractures. Soft tissue swelling about the shoulder. IMPRESSION: Comminuted fracture of the right humeral head. Electronically Signed   By: Placido Sou M.D.   On: 04/27/2022 18:52   DG Hip Unilat W or Wo Pelvis 2-3 Views Right  Result Date: 04/27/2022 CLINICAL DATA:  Trauma, fall EXAM: DG HIP (WITH OR WITHOUT PELVIS) 2-3V RIGHT COMPARISON:  None Available. FINDINGS: There is comminuted intertrochanteric fracture of proximal right femur. There is overriding of fracture fragments. There is 2 cm offset in alignment of lateral cortical margins in proximal femur. There is previous internal fixation in left femur. Vascular  calcifications are seen. Degenerative changes are noted in lumbar spine. IMPRESSION: Comminuted displaced fracture is seen in the intertrochanteric portion of proximal right femur. Electronically Signed   By: Elmer Picker M.D.   On: 04/27/2022 18:50    Review of Systems  HENT:  Negative for ear discharge, ear pain, hearing loss and tinnitus.   Eyes:  Negative for photophobia and pain.  Respiratory:  Negative for cough and shortness of breath.   Cardiovascular:  Negative for chest pain.  Gastrointestinal:  Negative for abdominal pain, nausea and vomiting.  Genitourinary:  Negative for dysuria, flank pain, frequency and urgency.  Musculoskeletal:  Positive for arthralgias (Right hip and right arm). Negative for back pain, myalgias and neck pain.  Neurological:  Negative for dizziness and headaches.  Hematological:  Does not bruise/bleed easily.  Psychiatric/Behavioral:  The patient is not nervous/anxious.    Blood pressure 128/85, pulse (!) 104, temperature 98.7 F (37.1 C), temperature source Oral, resp. rate 18, height 5' (1.524 m), weight 63.5 kg, SpO2 92 %. Physical Exam Constitutional:      General: She is not in acute distress.    Appearance: She is well-developed. She is not diaphoretic.  HENT:     Head: Normocephalic and atraumatic.  Eyes:     General: No scleral icterus.       Right eye: No discharge.        Left eye: No discharge.     Conjunctiva/sclera: Conjunctivae normal.  Cardiovascular:     Rate and Rhythm: Normal rate and regular rhythm.  Pulmonary:     Effort: Pulmonary effort is normal. No respiratory distress.  Musculoskeletal:     Cervical back: Normal range of motion.     Comments: Right shoulder, elbow, wrist, digits- no skin wounds, TTP shoulder, sling in place, no instability, no blocks to motion  Sens  Ax/R/M/U intact  Mot   Ax/ R/ PIN/ M/ AIN/ U intact  Rad 2+  RLE No traumatic wounds, ecchymosis, or rash  Mild hip TTP  No knee or ankle  effusion  Knee stable to varus/ valgus and anterior/posterior stress  Sens DPN, SPN, TN intact  Motor EHL, ext, flex, evers 5/5  DP 1+, PT 0, No significant edema  Skin:    General: Skin is warm and dry.  Neurological:     Mental Status: She is alert.  Psychiatric:        Mood and Affect: Mood normal.        Behavior: Behavior normal.     Assessment/Plan: Right hip fx -- Plan IMN today with Dr. Doreatha Martin. Please keep NPO. Right humerus fx -- Plan non-operative management with sling and NWB.    Laura Abu, PA-C Orthopedic Surgery [phone] 04/28/2022, 10:37 AM

## 2022-04-28 NOTE — Interval H&P Note (Signed)
History and Physical Interval Note:  04/28/2022 12:39 PM  Laura Parks  has presented today for surgery, with the diagnosis of Right intertrochanteric femur fracture.  The various methods of treatment have been discussed with the patient and family. After consideration of risks, benefits and other options for treatment, the patient has consented to  Procedure(s): INTRAMEDULLARY (IM) NAIL INTERTROCHANTERIC (Right) as a surgical intervention.  The patient's history has been reviewed, patient examined, no change in status, stable for surgery.  I have reviewed the patient's chart and labs.  Questions were answered to the patient's satisfaction.     Lennette Bihari P Amaani Guilbault

## 2022-04-28 NOTE — Anesthesia Preprocedure Evaluation (Signed)
Anesthesia Evaluation  Patient identified by MRN, date of birth, ID band Patient awake    Reviewed: Allergy & Precautions, NPO status , Patient's Chart, lab work & pertinent test results  Airway Mallampati: II  TM Distance: >3 FB Neck ROM: Full    Dental  (+) Dental Advisory Given   Pulmonary neg pulmonary ROS,    breath sounds clear to auscultation       Cardiovascular hypertension, Pt. on medications + CAD and +CHF  + dysrhythmias Atrial Fibrillation  Rhythm:Irregular Rate:Normal     Neuro/Psych TIACVA    GI/Hepatic negative GI ROS, Neg liver ROS,   Endo/Other  diabetes, Type 2  Renal/GU CRFRenal disease     Musculoskeletal   Abdominal   Peds  Hematology negative hematology ROS (+)   Anesthesia Other Findings   Reproductive/Obstetrics                             Anesthesia Physical Anesthesia Plan  ASA: 3  Anesthesia Plan: General   Post-op Pain Management: Ofirmev IV (intra-op)*   Induction: Intravenous  PONV Risk Score and Plan: 3 and Dexamethasone, Ondansetron and Treatment may vary due to age or medical condition  Airway Management Planned: Oral ETT  Additional Equipment: None  Intra-op Plan:   Post-operative Plan: Extubation in OR  Informed Consent: I have reviewed the patients History and Physical, chart, labs and discussed the procedure including the risks, benefits and alternatives for the proposed anesthesia with the patient or authorized representative who has indicated his/her understanding and acceptance.   Patient has DNR.  Discussed DNR with power of attorney and Suspend DNR.   Dental advisory given  Plan Discussed with: CRNA  Anesthesia Plan Comments:         Anesthesia Quick Evaluation

## 2022-04-28 NOTE — ED Notes (Signed)
Swelling and brusing noted to right shoulder

## 2022-04-28 NOTE — Transfer of Care (Signed)
Immediate Anesthesia Transfer of Care Note  Patient: Laura Parks  Procedure(s) Performed: INTRAMEDULLARY (IM) NAIL INTERTROCHANTERIC (Right: Hip)  Patient Location: PACU  Anesthesia Type:General  Level of Consciousness: patient cooperative and lethargic  Airway & Oxygen Therapy: Patient Spontanous Breathing and Patient connected to nasal cannula oxygen  Post-op Assessment: Report given to RN  Post vital signs: Reviewed and stable  Last Vitals:  Vitals Value Taken Time  BP 119/69 04/28/22 1452  Temp    Pulse 104 04/28/22 1455  Resp 15 04/28/22 1455  SpO2 98 % 04/28/22 1455  Vitals shown include unvalidated device data.  Last Pain:  Vitals:   04/28/22 0936  TempSrc: Oral  PainSc:          Complications: No notable events documented.

## 2022-04-28 NOTE — Anesthesia Procedure Notes (Signed)
Procedure Name: Intubation Date/Time: 04/28/2022 1:30 PM  Performed by: Barrington Ellison, CRNAPre-anesthesia Checklist: Patient identified, Emergency Drugs available, Suction available and Patient being monitored Patient Re-evaluated:Patient Re-evaluated prior to induction Oxygen Delivery Method: Circle System Utilized Preoxygenation: Pre-oxygenation with 100% oxygen Induction Type: IV induction Ventilation: Mask ventilation without difficulty Laryngoscope Size: Mac and 3 Grade View: Grade I Tube type: Oral Tube size: 7.0 mm Number of attempts: 1 Airway Equipment and Method: Stylet and Oral airway Placement Confirmation: ETT inserted through vocal cords under direct vision, positive ETCO2 and breath sounds checked- equal and bilateral Secured at: 21 cm Tube secured with: Tape Dental Injury: Teeth and Oropharynx as per pre-operative assessment

## 2022-04-28 NOTE — Progress Notes (Addendum)
Patient takes Alveda Reasons, last dose 8/13 at 0800  Patient stays at [facility]

## 2022-04-28 NOTE — Inpatient Diabetes Management (Signed)
Inpatient Diabetes Program Recommendations  AACE/ADA: New Consensus Statement on Inpatient Glycemic Control   Target Ranges:  Prepandial:   less than 140 mg/dL      Peak postprandial:   less than 180 mg/dL (1-2 hours)      Critically ill patients:  140 - 180 mg/dL    Latest Reference Range & Units 04/28/22 09:31  Glucose-Capillary 70 - 99 mg/dL 343 (H)   Review of Glycemic Control  Latest Reference Range & Units 04/27/22 19:19  Hemoglobin A1C 4.8 - 5.6 % 8.2 (H)   Diabetes history: DM2 Outpatient Diabetes medications: NONE Current orders for Inpatient glycemic control: Novolog 0-7 units Q2H PRN   Inpatient Diabetes Program Recommendations:    Insulin: Please consider ordering CBGs AC&HS and Novolog 0-9 units AC&HS.  HbgA1C:  A1C 8.2% on 04/27/22 indicating an average glucose of 189 mg/dl over the past 2-3 months. Please consider prescribing DM medication at time of discharge back to SNF.  Thanks, Barnie Alderman, RN, MSN, [place] Diabetes Coordinator Inpatient Diabetes Program [phone] (Team Pager from 8am to [place])

## 2022-04-29 DIAGNOSIS — S72001A Fracture of unspecified part of neck of right femur, initial encounter for closed fracture: Secondary | ICD-10-CM | POA: Diagnosis not present

## 2022-04-29 LAB — BASIC METABOLIC PANEL
Anion gap: 8 (ref 5–15)
BUN: 43 mg/dL — ABNORMAL HIGH (ref 8–23)
CO2: 20 mmol/L — ABNORMAL LOW (ref 22–32)
Calcium: 7.4 mg/dL — ABNORMAL LOW (ref 8.9–10.3)
Chloride: 110 mmol/L (ref 98–111)
Creatinine, Ser: 2.03 mg/dL — ABNORMAL HIGH (ref 0.44–1.00)
GFR, Estimated: 23 mL/min — ABNORMAL LOW (ref >60)
Glucose, Bld: 299 mg/dL — ABNORMAL HIGH (ref 70–99)
Potassium: 4.9 mmol/L (ref 3.5–5.1)
Sodium: 138 mmol/L (ref 135–145)

## 2022-04-29 LAB — VITAMIN D 25 HYDROXY (VIT D DEFICIENCY, FRACTURES): Vit D, 25-Hydroxy: 23.67 ng/mL — ABNORMAL LOW (ref 30–100)

## 2022-04-29 LAB — CBC
HCT: 22.1 % — ABNORMAL LOW (ref 36.0–46.0)
Hemoglobin: 7.3 g/dL — ABNORMAL LOW (ref 12.0–15.0)
MCH: 29.9 pg (ref 26.0–34.0)
MCHC: 33 g/dL (ref 30.0–36.0)
MCV: 90.6 fL (ref 80.0–100.0)
Platelets: 133 10*3/uL — ABNORMAL LOW (ref 150–400)
RBC: 2.44 MIL/uL — ABNORMAL LOW (ref 3.87–5.11)
RDW: 13.5 % (ref 11.5–15.5)
WBC: 13.6 10*3/uL — ABNORMAL HIGH (ref 4.0–10.5)
nRBC: 0 % (ref 0.0–0.2)

## 2022-04-29 LAB — GLUCOSE, CAPILLARY
Glucose-Capillary: 305 mg/dL — ABNORMAL HIGH (ref 70–99)
Glucose-Capillary: 217 mg/dL — ABNORMAL HIGH (ref 70–99)
Glucose-Capillary: 140 mg/dL — ABNORMAL HIGH (ref 70–99)

## 2022-04-29 MED ORDER — HYDROCODONE-ACETAMINOPHEN 5-325 MG PO TABS
1.0000 | ORAL_TABLET | ORAL | Status: DC | PRN
  Administered 2022-04-29: 1 via ORAL
  Filled 2022-04-29: qty 1

## 2022-04-29 MED ORDER — MELATONIN 3 MG PO TABS
6.0000 mg | ORAL_TABLET | Freq: Every day | ORAL | Status: DC
  Administered 2022-04-30: 6 mg via ORAL
  Filled 2022-04-29 (×2): qty 2

## 2022-04-29 MED ORDER — VITAMIN B-12 1000 MCG PO TABS
1000.0000 ug | ORAL_TABLET | Freq: Every day | ORAL | Status: DC
  Administered 2022-04-29 – 2022-05-01 (×2): 1000 ug via ORAL
  Filled 2022-04-29 (×3): qty 1

## 2022-04-29 MED ORDER — VITAMIN D 25 MCG (1000 UNIT) PO TABS
1000.0000 [IU] | ORAL_TABLET | Freq: Every day | ORAL | Status: DC
  Administered 2022-05-01: 1000 [IU] via ORAL
  Filled 2022-04-29 (×3): qty 1

## 2022-04-29 MED ORDER — FUROSEMIDE 20 MG PO TABS
20.0000 mg | ORAL_TABLET | Freq: Every day | ORAL | Status: DC
  Administered 2022-05-01: 20 mg via ORAL
  Filled 2022-04-29 (×3): qty 1

## 2022-04-29 MED ORDER — INSULIN ASPART 100 UNIT/ML IJ SOLN
0.0000 [IU] | Freq: Three times a day (TID) | INTRAMUSCULAR | Status: DC
  Administered 2022-04-29: 11 [IU] via SUBCUTANEOUS
  Administered 2022-04-29 – 2022-04-30 (×2): 2 [IU] via SUBCUTANEOUS
  Administered 2022-04-30: 5 [IU] via SUBCUTANEOUS
  Administered 2022-04-30: 8 [IU] via SUBCUTANEOUS
  Administered 2022-05-01: 3 [IU] via SUBCUTANEOUS
  Administered 2022-05-01: 2 [IU] via SUBCUTANEOUS

## 2022-04-29 MED ORDER — METOPROLOL TARTRATE 5 MG/5ML IV SOLN
5.0000 mg | Freq: Four times a day (QID) | INTRAVENOUS | Status: DC
  Administered 2022-04-29 – 2022-05-01 (×7): 5 mg via INTRAVENOUS
  Filled 2022-04-29 (×6): qty 5

## 2022-04-29 MED ORDER — FUROSEMIDE 20 MG PO TABS
20.0000 mg | ORAL_TABLET | ORAL | Status: DC
  Administered 2022-04-29: 20 mg via ORAL
  Filled 2022-04-29: qty 1

## 2022-04-29 MED ORDER — ENSURE ENLIVE PO LIQD
237.0000 mL | Freq: Two times a day (BID) | ORAL | Status: DC
  Administered 2022-04-30: 237 mL via ORAL

## 2022-04-29 MED ORDER — ADULT MULTIVITAMIN W/MINERALS CH
1.0000 | ORAL_TABLET | Freq: Every day | ORAL | Status: DC
  Administered 2022-05-01: 1 via ORAL
  Filled 2022-04-29 (×2): qty 1

## 2022-04-29 MED ORDER — INSULIN DETEMIR 100 UNIT/ML ~~LOC~~ SOLN
5.0000 [IU] | Freq: Every day | SUBCUTANEOUS | Status: DC
  Administered 2022-04-30 (×2): 5 [IU] via SUBCUTANEOUS
  Filled 2022-04-29 (×3): qty 0.05

## 2022-04-29 MED ORDER — FERROUS SULFATE 300 (60 FE) MG/5ML PO SYRP
300.0000 mg | ORAL_SOLUTION | Freq: Two times a day (BID) | ORAL | Status: DC
  Administered 2022-05-01: 300 mg via ORAL
  Filled 2022-04-29 (×6): qty 5

## 2022-04-29 MED ORDER — INSULIN ASPART 100 UNIT/ML IJ SOLN: 0.0000 [IU] | Freq: Every day | INTRAMUSCULAR | Status: DC

## 2022-04-29 MED ORDER — CITALOPRAM HYDROBROMIDE 20 MG PO TABS
20.0000 mg | ORAL_TABLET | Freq: Every day | ORAL | Status: DC
  Administered 2022-04-29 – 2022-05-01 (×2): 20 mg via ORAL
  Filled 2022-04-29 (×3): qty 1

## 2022-04-29 MED ORDER — DILTIAZEM HCL 30 MG PO TABS
120.0000 mg | ORAL_TABLET | Freq: Every day | ORAL | Status: DC
  Administered 2022-04-30 – 2022-05-01 (×2): 120 mg via ORAL
  Filled 2022-04-29 (×4): qty 4

## 2022-04-29 NOTE — Evaluation (Signed)
Physical Therapy Evaluation Patient Details Name: Laura Parks MRN: [id number] DOB: 1930/09/11 Today's Date: 04/29/2022  History of Present Illness  Laura Parks fell out of bed onto her right side while at the SNF where she resides. She had immediate right arm and hip pain. She was brought to [facility] where workup showed right proximal humerus and hip fractures and orthopedic surgery was consulted. She was transferred to [facility] for definitive care. Pt is s/p IM nail right femur. Nonoperative management for right proximal humerus fracture. PMH dementia, diabetes mellitus, atrial fibrillation, legally blind, hypertension, stroke, CHF.  Clinical Impression  Pt agreeable to physical therapy evaluation but required encouragement to participate. Pt requiring maximal to total assistance x 2 for bed mobility. Pt requiring moderate HHA for sit to stand transfer. Pt limited by pain, confusion, and weakness; further mobility deferred. Pt currently presents with functional limitations secondary to impairments listed in PT problem list. Pt to benefit from skilled, acute care physical therapy interventions to maximize her independence level and quality of life. Anticipate slow progress and decreased gains due to pt's age and comorbidities.     Recommendations for follow up therapy are one component of a multi-disciplinary discharge planning process, led by the attending physician.  Recommendations may be updated based on patient status, additional functional criteria and insurance authorization.  Follow Up Recommendations Skilled nursing-short term rehab (<3 hours/day) Can patient physically be transported by private vehicle: No    Assistance Recommended at Discharge Frequent or constant Supervision/Assistance  Patient can return home with the following  A lot of help with walking and/or transfers;A lot of help with bathing/dressing/bathroom;Assistance with cooking/housework;Direct supervision/assist for medications  management;Direct supervision/assist for financial management;Assist for transportation    Equipment Recommendations Other (comment) (TBD)  Recommendations for Other Services       Functional Status Assessment Patient has had a recent decline in their functional status and demonstrates the ability to make significant improvements in function in a reasonable and predictable amount of time.     Precautions / Restrictions Precautions Precautions: Fall Required Braces or Orthoses: Sling (R UE) Restrictions Weight Bearing Restrictions: Yes RUE Weight Bearing: Non weight bearing RLE Weight Bearing: Weight bearing as tolerated      Mobility  Bed Mobility Overal bed mobility: Needs Assistance Bed Mobility: Supine to Sit, Sit to Supine     Supine to sit: Total assist, +2 for physical assistance, HOB elevated Sit to supine: Max assist, +2 for physical assistance   General bed mobility comments: Pt not providing any effort with supine to sit transfer but able to provide some effort when returning to bed. Sling pad utilized to mobilize pt closer to EOB. Pt also performed rolling to left side and scooting to [facility] with max assist x 2. Max cues throughout.    Transfers Overall transfer level: Needs assistance Equipment used: 1 person hand held assist Transfers: Sit to/from Stand Sit to Stand: Mod assist, +2 physical assistance           General transfer comment: Pt with unsteadiness, weakness, confusion, and fatigue during static standing. Deferred further mobility.    Ambulation/Gait               General Gait Details: unable  Stairs            Wheelchair Mobility    Modified Rankin (Stroke Patients Only)       Balance Overall balance assessment: Needs assistance Sitting-balance support: Single extremity supported, No upper extremity supported, Feet supported Sitting  balance-Leahy Scale:  (initially poor, fair thereafter) Sitting balance - Comments: Pt  required moderate assistance to maintain sitting balance at EOB due to retropulsion. However, pt able to maintain sitting balance with standby A later on (holding bed rail with L hand at times).   Standing balance support: Single extremity supported Standing balance-Leahy Scale: Poor                               Pertinent Vitals/Pain Pain Assessment Pain Assessment: Faces Pain Score:  (unable to quantify) Faces Pain Scale: Hurts even more Pain Location: R shoulder and R hip Pain Descriptors / Indicators: Grimacing Pain Intervention(s): Limited activity within patient's tolerance, Monitored during session    Home Living Family/patient expects to be discharged to:: Skilled nursing facility                   Additional Comments: Unable to determine DME owned    Prior Function Prior Level of Function :  (Likely required assistance at baseline but unable to determine due to pt's cognitive abilities)             Mobility Comments: Pt mostly likely used AD at baseline if was ambulatory. ADLs Comments: Likely required assistance for ADL's.     Hand Dominance   Dominant Hand: Right    Extremity/Trunk Assessment   Upper Extremity Assessment Upper Extremity Assessment: Defer to OT evaluation    Lower Extremity Assessment Lower Extremity Assessment:  (Grossly 3+ to 4-/5 L LE and 3- to 3+/5 R LE)    Cervical / Trunk Assessment Cervical / Trunk Assessment: Kyphotic  Communication   Communication: HOH  Cognition Arousal/Alertness: Awake/alert Behavior During Therapy: Flat affect Overall Cognitive Status: History of cognitive impairments - at baseline                                 General Comments: Pt has dementia. Pt required increased time to follow commands. Pt not able to orient herself to person, place, time, or situation.        General Comments General comments (skin integrity, edema, etc.): HR and SpO2 stable on 1.5 L O2     Exercises     Assessment/Plan    PT Assessment Patient needs continued PT services  PT Problem List Decreased strength;Decreased activity tolerance;Decreased range of motion;Decreased balance;Decreased mobility;Decreased cognition;Decreased knowledge of use of DME;Decreased safety awareness;Decreased knowledge of precautions;Pain       PT Treatment Interventions DME instruction;Gait training;Functional mobility training;Therapeutic activities;Therapeutic exercise;Balance training;Neuromuscular re-education;Cognitive remediation;Patient/family education;Wheelchair mobility training;Modalities    PT Goals (Current goals can be found in the Care Plan section)  Acute Rehab PT Goals Patient Stated Goal: unable PT Goal Formulation: With patient Time For Goal Achievement: 05/08/22 Potential to Achieve Goals:  (guarded)    Frequency Min 3X/week     Co-evaluation               AM-PAC PT "6 Clicks" Mobility  Outcome Measure Help needed turning from your back to your side while in a flat bed without using bedrails?: Total Help needed moving from lying on your back to sitting on the side of a flat bed without using bedrails?: Total Help needed moving to and from a bed to a chair (including a wheelchair)?: Total Help needed standing up from a chair using your arms (e.g., wheelchair or bedside chair)?: Total Help needed  to walk in hospital room?: Total Help needed climbing 3-5 steps with a railing? : Total 6 Click Score: 6    End of Session Equipment Utilized During Treatment: Gait belt;Oxygen Activity Tolerance: Patient limited by fatigue;Patient limited by pain;Other (comment) (confusion) Patient left: in bed;with bed alarm set;with call bell/phone within reach Nurse Communication: Mobility status;Precautions;Weight bearing status PT Visit Diagnosis: Other abnormalities of gait and mobility (R26.89);Muscle weakness (generalized) (M62.81);History of falling (Z91.81);Pain Pain -  Right/Left: Right Pain - part of body: Leg;Shoulder    Time: 3578-9784 PT Time Calculation (min) (ACUTE ONLY): 21 min   Charges:   PT Evaluation $PT Eval Moderate Complexity: 1 Mod          Donna Bernard, PT   [facility] 04/29/2022, 1:49 PM

## 2022-04-29 NOTE — Progress Notes (Signed)
Initial Nutrition Assessment  DOCUMENTATION CODES:   Not applicable  INTERVENTION:  Continue current diet as ordered Ensure Enlive po BID, each supplement provides 350 kcal and 20 grams of protein. Give medications with ensure MVI with minerals daily Vitamin D 1000IU daily for deficiency  NUTRITION DIAGNOSIS:   Inadequate oral intake related to poor appetite as evidenced by meal completion < 25%.  GOAL:   Patient will meet greater than or equal to 90% of their needs  MONITOR:   PO intake, Supplement acceptance, Labs, Weight trends  REASON FOR ASSESSMENT:   Consult Hip fracture protocol  ASSESSMENT:   Pt with hx of dementia, DM type 2, atrial fibrillation, HTN, CHF, and legally blind presented to ED from her nursing center after a fall. Imaging showed a right intertrochanteric femur fracture and right proximal humerus fracture  Pt taken for repair of femur but right humerus to be managed non-operatively.  8/14 - Op, Cephalomedullary nailing of right intertrochanteric femur fracture   Pt resting in bed at the time of assessment, RN present in room. Reports that pt's intake has been extremely poor today. Only took a few bites of lunch and then spit the rest out.  Pt states she is not feeling like eating. Also doesn't want anything to drink at the moment. Will add ensure and encouraged her to drink one later. Likes vanilla flavors best.  4.5% weight loss noted over the last 6 months which is not severe, but concerning due to age. Some deficits noted on exam, but seem to be more consistent with normal aging. Pt at risk for becoming malnourished if poor PO continues.  Nutritionally Relevant Medications: Scheduled Meds:  cyanocobalamin  1,000 mcg Oral Daily   docusate sodium  100 mg Oral BID   ferrous sulfate  300 mg Oral BID WC   furosemide  20 mg Oral QODAY   insulin aspart  0-15 Units Subcutaneous TID WC   insulin aspart  0-5 Units Subcutaneous QHS   insulin detemir  5  Units Subcutaneous QHS   Continuous Infusions:   ceFAZolin (ANCEF) IV 2 g (04/29/22 0551)    Labs Reviewed: BUN 43, creatinine 2.03 CBG ranges from 191-343 mg/dL over the last 24 hours HgbA1c 8.2% Vitamin D 23.67  NUTRITION - FOCUSED PHYSICAL EXAM:  Flowsheet Row Most Recent Value  Orbital Region Mild depletion  Upper Arm Region No depletion  Thoracic and Lumbar Region No depletion  Buccal Region No depletion  Temple Region No depletion  Clavicle Bone Region Mild depletion  Clavicle and Acromion Bone Region Mild depletion  Scapular Bone Region No depletion  Dorsal Hand Moderate depletion  Patellar Region Mild depletion  Anterior Thigh Region Mild depletion  Posterior Calf Region Mild depletion  Edema (RD Assessment) None  Hair Reviewed  Eyes Reviewed  Mouth Reviewed  Skin Reviewed  Nails Reviewed   Diet Order:   Diet Order             Diet Carb Modified Fluid consistency: Thin; Room service appropriate? Yes  Diet effective now                   EDUCATION NEEDS:   Not appropriate for education at this time  Skin:  Skin Assessment: Reviewed RN Assessment (surgical incision, right hip)  Last BM:  unsure  Height:   Ht Readings from Last 1 Encounters:  04/28/22 5' (1.524 m)    Weight:   Wt Readings from Last 1 Encounters:  04/28/22 63.5 kg  Ideal Body Weight:  45.5 kg  BMI:  Body mass index is 27.34 kg/m.  Estimated Nutritional Needs:  Kcal:  1400-1600 kcal/d Protein:  70-80g/d Fluid:  1.5-1.8 L/d    Ranell Patrick, RD, LDN Clinical Dietitian RD pager # available in [place]  After hours/weekend pager # available in [facility]

## 2022-04-29 NOTE — Plan of Care (Signed)
  Problem: Tissue Perfusion: Goal: Adequacy of tissue perfusion will improve Outcome: Progressing   Problem: Clinical Measurements: Goal: Ability to maintain clinical measurements within normal limits will improve Outcome: Progressing Goal: Will remain free from infection Outcome: Progressing   Problem: Activity: Goal: Risk for activity intolerance will decrease Outcome: Progressing   Problem: Elimination: Goal: Will not experience complications related to bowel motility Outcome: Progressing   Problem: Pain Managment: Goal: General experience of comfort will improve Outcome: Progressing   Problem: Safety: Goal: Ability to remain free from injury will improve Outcome: Progressing   Problem: Education: Goal: Ability to describe self-care measures that may prevent or decrease complications (Diabetes Survival Skills Education) will improve Outcome: Not Progressing   Problem: Coping: Goal: Ability to adjust to condition or change in health will improve Outcome: Not Progressing   Problem: Fluid Volume: Goal: Ability to maintain a balanced intake and output will improve Outcome: Not Progressing   Problem: Health Behavior/Discharge Planning: Goal: Ability to manage health-related needs will improve Outcome: Not Progressing   Problem: Nutritional: Goal: Maintenance of adequate nutrition will improve Outcome: Not Progressing   Problem: Education: Goal: Knowledge of General Education information will improve Description: Including pain rating scale, medication(s)/side effects and non-pharmacologic comfort measures Outcome: Not Progressing   Problem: Health Behavior/Discharge Planning: Goal: Ability to manage health-related needs will improve Outcome: Not Progressing   Problem: Clinical Measurements: Goal: Cardiovascular complication will be avoided Outcome: Not Progressing   Problem: Nutrition: Goal: Adequate nutrition will be maintained Outcome: Not Progressing    Problem: Coping: Goal: Level of anxiety will decrease Outcome: Not Progressing   Problem: Metabolic: Goal: Ability to maintain appropriate glucose levels will improve Outcome: Adequate for Discharge   Problem: Skin Integrity: Goal: Risk for impaired skin integrity will decrease Outcome: Adequate for Discharge   Problem: Clinical Measurements: Goal: Diagnostic test results will improve Outcome: Adequate for Discharge Goal: Respiratory complications will improve Outcome: Adequate for Discharge   Problem: Nutritional: Goal: Progress toward achieving an optimal weight will improve Outcome: Not Applicable   Problem: Elimination: Goal: Will not experience complications related to urinary retention Outcome: Not Applicable

## 2022-04-29 NOTE — Progress Notes (Signed)
Pt was assisted with meals, and ate less than 5%, pt was educated and encouraged to eat. Needs reinforcement

## 2022-04-29 NOTE — Progress Notes (Signed)
Orthopaedic Trauma Progress Note  SUBJECTIVE: Doing okay this morning.  Pain in right hip and right shoulder are currently well controlled.  Patient has not been up out of bed since surgery.  No chest pain. No SOB. No nausea/vomiting. No other complaints.   OBJECTIVE:  Vitals:   04/29/22 0553 04/29/22 0651  BP: (!) 108/59 92/65  Pulse:    Resp:  16  Temp: 97.7 F (36.5 C) 98 F (36.7 C)  SpO2:  98%    General: Laying in bed, no acute distress.   Respiratory: No increased work of breathing.  Right upper extremity: Sling in place.  Tender over the shoulder.  Tolerates gentle wrist range of motion.  Able to wiggle the fingers.  Neurovascularly intact Right lower extremity: Dressings clean, dry, intact.  Tender over the hip as expected.  Tolerates ankle DF/PF.  Endorses sensation lower extremity.  Neurovascularly intact.  Compartment soft compressible.  IMAGING: Stable post op imaging.   LABS:  Results for orders placed or performed during the hospital encounter of 04/27/22 (from the past 24 hour(s))  Glucose, capillary     Status: Abnormal   Collection Time: 04/28/22  9:31 AM  Result Value Ref Range   Glucose-Capillary 343 (H) 70 - 99 mg/dL  Glucose, capillary     Status: Abnormal   Collection Time: 04/28/22  1:10 PM  Result Value Ref Range   Glucose-Capillary 194 (H) 70 - 99 mg/dL  Glucose, capillary     Status: Abnormal   Collection Time: 04/28/22  2:55 PM  Result Value Ref Range   Glucose-Capillary 191 (H) 70 - 99 mg/dL  CBC     Status: Abnormal   Collection Time: 04/28/22  7:18 PM  Result Value Ref Range   WBC 21.1 (H) 4.0 - 10.5 K/uL   RBC 3.36 (L) 3.87 - 5.11 MIL/uL   Hemoglobin 10.2 (L) 12.0 - 15.0 g/dL   HCT 31.0 (L) 36.0 - 46.0 %   MCV 92.3 80.0 - 100.0 fL   MCH 30.4 26.0 - 34.0 pg   MCHC 32.9 30.0 - 36.0 g/dL   RDW 13.2 11.5 - 15.5 %   Platelets 204 150 - 400 K/uL   nRBC 0.0 0.0 - 0.2 %  CBC     Status: Abnormal   Collection Time: 04/29/22  2:29 AM  Result  Value Ref Range   WBC 13.6 (H) 4.0 - 10.5 K/uL   RBC 2.44 (L) 3.87 - 5.11 MIL/uL   Hemoglobin 7.3 (L) 12.0 - 15.0 g/dL   HCT 22.1 (L) 36.0 - 46.0 %   MCV 90.6 80.0 - 100.0 fL   MCH 29.9 26.0 - 34.0 pg   MCHC 33.0 30.0 - 36.0 g/dL   RDW 13.5 11.5 - 15.5 %   Platelets 133 (L) 150 - 400 K/uL   nRBC 0.0 0.0 - 0.2 %  Basic metabolic panel     Status: Abnormal   Collection Time: 04/29/22  2:29 AM  Result Value Ref Range   Sodium 138 135 - 145 mmol/L   Potassium 4.9 3.5 - 5.1 mmol/L   Chloride 110 98 - 111 mmol/L   CO2 20 (L) 22 - 32 mmol/L   Glucose, Bld 299 (H) 70 - 99 mg/dL   BUN 43 (H) 8 - 23 mg/dL   Creatinine, Ser 2.03 (H) 0.44 - 1.00 mg/dL   Calcium 7.4 (L) 8.9 - 10.3 mg/dL   GFR, Estimated 23 (L) >60 mL/min   Anion gap 8 5 - 15  ASSESSMENT: Laura Parks is a 88 y.o. female, 1 Day Post-Op s/p INTRAMEDULLARY NAIL RIGHT INTERTROCHANTERIC FEMUR FRACTURE NONOPERATIVE MANAGEMENT RIGHT PROXIMAL HUMERUS FRACTURE  CV/Blood loss: Acute blood loss anemia, Hgb 7.3 this morning.  Continue to monitor CBC  PLAN: Weightbearing: WBAT RLE, NWB RUE ROM: Unrestricted ROM RLE.  Maintain sling RUE Incisional and dressing care: Reinforce dressings as needed.  Plan to remove dressing RLE 04/30/2022 Showering: Okay to begin showering with assistance and getting incisions wet 05/01/2022 if no drainage from incisions Orthopedic device(s): Sling RUE Pain management:  1. Tylenol 325-650 mg q 6 hours PRN 2. Robaxin 500 mg q 6 hours PRN 3. Norco 5-325 mg q 4 hours PRN 4. Morphine 0.5-1 mg q 2 hours PRN VTE prophylaxis:  Hold restarting Xarelto until hemoglobin stabilizes.  SCDs ID:  Ancef 2gm post op Foley/Lines:  No foley, KVO IVFs Impediments to Fracture Healing: Vitamin D level pending, will start supplementation as indicated Dispo: PT/OT evaluation today.  Patient will likely require SNF.  Continue to monitor CBC and restart Xarelto once hemoglobin stabilizes.  Plan to remove dressings RLE  04/30/2022.  Maintain sling RUE and nonweightbearing precautions x3 weeks   D/C recommendations: -Norco for pain control -Home dose Xarelto for DVT prophylaxis -Possible need for Vit D supplementation  Follow - up plan:  2 weeks after discharge for wound check and repeat x-rays right femur and right shoulder   Contact information:  Katha Hamming MD, Rushie Nyhan PA-C. After hours and holidays please check [email or web address] for group call information for [facility]   Gwinda Passe, PA-C [phone] (office) [email or web address]

## 2022-04-29 NOTE — Progress Notes (Signed)
Mobility Specialist Progress Note   04/29/22 1040  Mobility  Activity Turned to left side;Turned to back - supine  Level of Assistance +2 (takes two people)  Assistive Device  (HHA)  RUE Weight Bearing NWB  RLE Weight Bearing WBAT  Activity Response Tolerated well  $Mobility charge 1 Mobility   NT requesting assistance in peri-care d/t pt soiling bed. Pt presenting w/ cognitive deficit and barely able to follow cues. Requiring HHA to turn pt on L side and back supine that was tolerable for pt. Once clean, left w/ NT and RN still in room.    Holland Falling Mobility Specialist MS [facility] #:  [phone] Acute Rehab Office:  [phone]

## 2022-04-29 NOTE — TOC CAGE-AID Note (Signed)
Transition of Care [facility]) - CAGE-AID Screening   Patient Details  Name: Laura Parks MRN: [id number] Date of Birth: [birth year]  Transition of Care [facility]) CM/SW Contact:    Coralee Pesa, [place] Phone Number: 04/29/2022, 9:48 AM   Clinical Narrative: Per chart review, pt is disoriented and not appropriate for CAGE- AID assessment at this time.   CAGE-AID Screening: Substance Abuse Screening unable to be completed due to: : Patient unable to participate             Substance Abuse Education Offered: No

## 2022-04-29 NOTE — TOC Initial Note (Signed)
Transition of Care [facility]) - Initial/Assessment Note    Patient Details  Name: Laura Parks MRN: [id number] Date of Birth: 1929-11-09  Transition of Care [facility]) CM/SW Contact:    Sharin Mons, RN Phone Number: 04/29/2022, 3:26 PM  Clinical Narrative:   Pt s/p fall, suffered R hip and humerus fxs.        -s/p Cephalomedullary nailing of right intertrochanteric femur fracture, 8/14   Pt is from [facility], LTC/SNF. Pt oriented x1 (self). NCM called Bonnetta Barry (daughter) @ [phone] to discuss disposition plans. Call unsuccessful, unable to leave voice message.  Per liaison [facility] P[facility] pt was [facility]. NCM called [facility] to confirm  and spoke with liaison Anderson Malta. Jennifer informed NCM hospice services were d/c the day prior to surgery.    Per PT's recommendation : Skilled nursing-short term rehab (<3 hours/day).  TOC team following and will assist with needs....  Expected Discharge Plan: Skilled Nursing Facility Barriers to Discharge: Continued Medical Work up   Patient Goals and CMS Choice        Expected Discharge Plan and Services Expected Discharge Plan: [place]                                              Prior Living Arrangements/Services                       Activities of Daily Living      Permission Sought/Granted                  Emotional Assessment              Admission diagnosis:  Closed right hip fracture ([place]) [S72.001A] Closed displaced intertrochanteric fracture of right femur, initial encounter ([place]) [S72.141A] Closed fracture of multiple ribs of right side, initial encounter [S22.41XA] Atrial fibrillation, unspecified type ([place]) [I48.91] Closed fracture of proximal end of right humerus, unspecified fracture morphology, initial encounter [S42.201A] Ground-level fall [W18.30XA] Patient Active Problem List   Diagnosis Date Noted    Closed right hip fracture ([place]) 04/27/2022   Humerus head fracture, right, closed, initial encounter 04/27/2022   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   Fracture of ramus of left pubis with routine healing 04/09/2020   Closed left hip fracture ([place]) 06/09/2019   Renal insufficiency 06/09/2019   Blood in the stool 06/18/2016   Family hx of colon cancer 06/18/2016   Absolute anemia 60/63/0160   Embolic stroke ([place]) 10/93/2355   Cerebrovascular accident (CVA) ([place])    Cerebral infarction (hemorrhagic or thromboembolic)    Acute delirium 12/31/2015   Vascular dementia ([place]) 12/31/2015   PAF (paroxysmal atrial fibrillation) ([place])    Palliative care encounter    Acute renal failure (HCC)    Altered mental status    Stroke [facility])    NSTEMI (non-ST elevated myocardial infarction) ([place])    Hyperkalemia 12/27/2015   CHF (congestive heart failure) ([place]) 12/27/2015   Acute encephalopathy 12/27/2015   TIA (transient ischemic attack) 12/27/2015   AKI (acute kidney injury) ([place]) 12/27/2015   Dehydration 12/27/2015   Legal blindness 12/27/2015   B12 deficiency 09/02/2014   Gait abnormality 07/26/2014   Delusions ([place]) 07/26/2014   Depression 07/26/2014   Cognitive impairment    Type 2 diabetes mellitus ([place]) 02/02/2014   Carotid  artery occlusion 02/02/2014   PVC's (premature ventricular contractions) 02/02/2014   Mixed hyperlipidemia 02/23/2012   Essential hypertension, benign 02/23/2012   PCP:  Neale Burly, MD Pharmacy:   [facility] - [address] [place] [address] Phone: [phone] Fax: [phone]  [place], [place] - [address] [place] [address] Phone: [phone] Fax: [phone]     Social Determinants of Health ([place]) Interventions    Readmission Risk Interventions     No data to display

## 2022-04-29 NOTE — Evaluation (Signed)
Occupational Therapy Evaluation Patient Details Name: Laura Parks MRN: [id number] DOB: November 27, 1929 Today's Date: 04/29/2022   History of Present Illness Pt is an 88 y/o F presenting to ED on 8/13 from [facility], fell out of bed onto R side, imaging revealing R intertrchanteric femur fx and R proximal humerus fx. S/p cephalomedullary nailing of R femur fx, nonoperative mgmt wtih sling an NWB of humerus fx. PMH includes anemia, cognitive impairments, legally blind, CAD, HTN, CVA, HLD, and DM2   Clinical Impression   Pt from SNF, questionable historian however reports receiving assist with ADLs and using RW for mobility. Pt currently needing mod-max A +2 for ADLs, max-total A +2 for bed mobility, and mod A +2 for sit to stand transfer attempt. Adjusted pt's sling for proper positioning, pt needing max cues to not use RUE during mobility tasks, will benefit from continued reinforcement. Pt presenting with impairments listed below, will follow acutely. Recommend SNF at d/c.      Recommendations for follow up therapy are one component of a multi-disciplinary discharge planning process, led by the attending physician.  Recommendations may be updated based on patient status, additional functional criteria and insurance authorization.   Follow Up Recommendations  Skilled nursing-short term rehab (<3 hours/day)    Assistance Recommended at Discharge Frequent or constant Supervision/Assistance  Patient can return home with the following Two people to help with walking and/or transfers;A lot of help with bathing/dressing/bathroom;Assistance with cooking/housework;Assistance with feeding;Assist for transportation;Help with stairs or ramp for entrance;Direct supervision/assist for financial management;Direct supervision/assist for medications management    Functional Status Assessment  Patient has had a recent decline in their functional status and demonstrates the ability to make significant  improvements in function in a reasonable and predictable amount of time.  Equipment Recommendations  None recommended by OT (defer)    Recommendations for Other Services PT consult     Precautions / Restrictions Precautions Precautions: Fall Required Braces or Orthoses: Sling (RUE) Restrictions Weight Bearing Restrictions: Yes RUE Weight Bearing: Non weight bearing RLE Weight Bearing: Weight bearing as tolerated      Mobility Bed Mobility Overal bed mobility: Needs Assistance Bed Mobility: Supine to Sit, Sit to Supine     Supine to sit: Total assist, +2 for physical assistance, HOB elevated Sit to supine: Max assist, +2 for physical assistance   General bed mobility comments: helicopter method used    Transfers Overall transfer level: Needs assistance Equipment used: 2 person hand held assist Transfers: Sit to/from Stand Sit to Stand: Mod assist, +2 physical assistance                  Balance Overall balance assessment: Needs assistance Sitting-balance support: Single extremity supported, No upper extremity supported, Feet supported Sitting balance-Leahy Scale: Poor Sitting balance - Comments: poor approaching fair   Standing balance support: Single extremity supported Standing balance-Leahy Scale: Poor Standing balance comment: reliant on external support,                           ADL either performed or assessed with clinical judgement   ADL Overall ADL's : Needs assistance/impaired Eating/Feeding: Moderate assistance   Grooming: Moderate assistance   Upper Body Bathing: Maximal assistance   Lower Body Bathing: Maximal assistance   Upper Body Dressing : Maximal assistance   Lower Body Dressing: Maximal assistance   Toilet Transfer: +2 for physical assistance;Moderate assistance   Toileting- Clothing Manipulation and Hygiene: Maximal assistance  Functional mobility during ADLs: Maximal assistance;+2 for physical assistance        Vision Baseline Vision/History: 2 Legally blind       Perception     Praxis      Pertinent Vitals/Pain Pain Assessment Pain Assessment: Faces Pain Score: 5  Faces Pain Scale: Hurts even more Pain Location: R shoulder and R hip Pain Descriptors / Indicators: Grimacing Pain Intervention(s): Limited activity within patient's tolerance, Monitored during session, Repositioned     Hand Dominance Right   Extremity/Trunk Assessment Upper Extremity Assessment Upper Extremity Assessment: Generalized weakness;RUE deficits/detail RUE Deficits / Details: able to flex/ext digits of R hand RUE: Unable to fully assess due to immobilization RUE Coordination: decreased gross motor;decreased fine motor   Lower Extremity Assessment Lower Extremity Assessment: Defer to PT evaluation   Cervical / Trunk Assessment Cervical / Trunk Assessment: Kyphotic   Communication Communication Communication: HOH   Cognition Arousal/Alertness: Awake/alert Behavior During Therapy: Flat affect Overall Cognitive Status: History of cognitive impairments - at baseline                                 General Comments: pt with baseline dementia, cog impairments     General Comments  VSS on supplemental O2    Exercises     Shoulder Instructions      Home Living Family/patient expects to be discharged to:: Skilled nursing facility                                 Additional Comments: pt reporting using RW for mobility, questionable historian      Prior Functioning/Environment Prior Level of Function : Needs assist             Mobility Comments: reports using RW? questionable historian ADLs Comments: reports staff assists with ADLs        OT Problem List: Decreased strength;Decreased range of motion;Decreased activity tolerance;Impaired balance (sitting and/or standing);Decreased cognition;Decreased safety awareness;Impaired vision/perception;Decreased  coordination;Impaired UE functional use      OT Treatment/Interventions: Self-care/ADL training;Therapeutic exercise;DME and/or AE instruction;Energy conservation;Therapeutic activities;Visual/perceptual remediation/compensation;Patient/family education;Balance training;Cognitive remediation/compensation    OT Goals(Current goals can be found in the care plan section) Acute Rehab OT Goals Patient Stated Goal: none stated OT Goal Formulation: With patient Time For Goal Achievement: 05/13/22 Potential to Achieve Goals: Good ADL Goals Pt Will Perform Grooming: with set-up;standing;sitting Pt Will Perform Upper Body Dressing: with min assist;sitting;standing Additional ADL Goal #1: Pt will complete bed mobility mod A in prep for ADLs  OT Frequency: Min 2X/week    Co-evaluation PT/OT/SLP Co-Evaluation/Treatment: Yes Reason for Co-Treatment: Complexity of the patient's impairments (multi-system involvement);For patient/therapist safety;To address functional/ADL transfers          AM-PAC OT "6 Clicks" Daily Activity     Outcome Measure Help from another person eating meals?: A Lot Help from another person taking care of personal grooming?: A Lot Help from another person toileting, which includes using toliet, bedpan, or urinal?: A Lot Help from another person bathing (including washing, rinsing, drying)?: A Lot Help from another person to put on and taking off regular upper body clothing?: A Lot Help from another person to put on and taking off regular lower body clothing?: A Lot 6 Click Score: 12   End of Session Equipment Utilized During Treatment: Gait belt;Oxygen Nurse Communication: Mobility status  Activity Tolerance: Patient tolerated  treatment well Patient left: in bed;with call bell/phone within reach;with bed alarm set  OT Visit Diagnosis: Unsteadiness on feet (R26.81);Other abnormalities of gait and mobility (R26.89);Muscle weakness (generalized) (M62.81);Other symptoms and  signs involving cognitive function;Low vision, both eyes (H54.2)                Time: 1164-3539 OT Time Calculation (min): 23 min Charges:  OT General Charges $OT Visit: 1 Visit OT Evaluation $OT Eval Moderate Complexity: 1 [address] OTD, OTR/L Acute Rehab [phone] - [place] 04/29/2022, 4:10 PM

## 2022-04-29 NOTE — Consult Note (Signed)
WOC Nurse Consult Note: Reason for Consult:wound to left breat. Patient was seen by Dr. Mickeal Needy in [place] in December 2022 for this problem and it was identified tat the patient would require a mastectomy for her breast cancer. A biopsy had been performed. See noted from that encounter dated 08/27/21. Wound type:neoplastic Pressure Injury POA: N/A Wound bed:Dry, crusted serum in area Measurement:  Bedside RN is to measure areas today and document on nursing flow sheet with next dressing change. Drainage (amount, consistency, odor) small serous Periwound:discolored (deeply pigmented) Dressing procedure/placement/frequency:I will implement a conservative POC using and antimicrobial nonadherent agauze (xeroform) applied once daily after cleansing. Securement will be with a silicone foam bordered dressing for atraumatic dressing changes.  Guidance for other PI prevention interventions are provided such as a sacral prophylactic foam dressing and turning and repositioning guidance to minimize time in the supine position.  [place] nursing team will not follow, but will remain available to this patient, the nursing and medical teams.  Please re-consult if needed.  Thank you for inviting [place] to participate in this patient's Plan of Care.  Maudie Flakes, MSN, RN, CNS, [place], Serita Grammes, [employer], [employer] phone:  [phone]

## 2022-04-29 NOTE — Progress Notes (Signed)
Progress Note   Patient: Laura Parks OAC:[id number] DOB: 1929/11/11 DOA: 04/27/2022     2 DOS: the patient was seen and examined on 04/29/2022   Brief hospital course: 88 y.o. female with medical history significant for dementia, diabetes mellitus, atrial fibrillation, legally blind, hypertension, stroke, CHF. Presented to ED on 8/13 for fall form bed and c/o R hip and R arm pain. Per medication list from the nursing center at bedside, patient's last dose of Eliquis was this morning.   8/14- R Intertrochanteric femur nail  8/15- pain well controlled, sugars little elevated, slightly confused   Assessment and Plan: * Closed right hip fracture (HCC) Status post mechanical fall.  X-ray pelvis- Comminuted displaced fracture is seen in the intertrochanteric portion of proximal right femur.  Per medication list from nursing home, last Xarelto  dose was this morning- 8/13. - EDP to Dr. Doreatha Martin, admit to [facility], n.p.o. midnight. -IV morphine 4 mg as needed - N/s 75cc/hr x 12 hrs  Humerus head fracture, right, closed, initial encounter Status post mechanical fall off from the bed.  Right shoulder x-ray- Comminuted fracture of the right humeral head. Nonoperative per orthopedic  Vascular dementia [facility]) Nursing home resident. -Resume Aricept.  PAF (paroxysmal atrial fibrillation) ([place]) Heart rates up to 105.  On anticoagulation with Xarelto.  Last dose of Xarelto was this morning.  Last echo 09/2021 EF of 65 to 70%.  -Hold Xarelto -Resume Cardizem -Will add as needed IV metoprolol 5 mg for heart rate > 120  CHF (congestive heart failure) (HCC) Chronic diastolic CHF.  Stable and compensated.  Lasix 20 mg every other day.  Last echo 09/2021, EF of 65 to 70%.  QTc prolonged at 502.  Previously borderline prolonged. -Check magnesium  Type 2 diabetes mellitus ([place]) With hyperglycemia.   - HgbA1c- 8.2%, will need optimal control while inpatient, SSI, long acting ordered Adjusted as  needed  Essential hypertension, benign Stable. -Resume home diltiazem Resume lasix once hypotension improves        Subjective: NAEON, transfer from [facility] this AM plan for IM nail R femur per orthopedic team today, remains NPO and with pain regimen on board.   Physical Exam: Vitals:   04/29/22 1034 04/29/22 1232 04/29/22 1303 04/29/22 1333  BP: 114/72 115/60 119/77 113/66  Pulse: 90     Resp: '18 19 14 18  '$ Temp: 98 F (36.7 C)   98 F (36.7 C)  TempSrc: Oral   Oral  SpO2:    98%  Weight:      Height:      Eyes: Pupils equal, lids and conjunctivae normal ENMT: Mucous membranes are mildly dry.  Neck: normal, supple, no masses, no thyromegaly Respiratory: clear to auscultation bilaterally, no wheezing, no crackles. Normal respiratory effort. No accessory muscle use.  Cardiovascular: Regular rate and rhythm, no murmurs / rubs / gallops. No extremity edema.  Abdomen: no tenderness, no masses palpated. No hepatosplenomegaly Musculoskeletal: no clubbing / cyanosis.   Skin: no rashes, lesions, ulcers. No induration Neurologic: Exam limited by patient's dementia, no apparent cranial nerve abnormality . Psychiatric: Awake, alert, not answering questions.   Data Reviewed:  There are no new results to review at this time.  Family Communication: none available, primary contact son  Disposition: Status is: Inpatient Remains inpatient appropriate because: orthopedic repair, recovery  Planned Discharge Destination: pending, likely Str or back to nursing facility with PT    Time spent: 25 minutes  Author: Vanna Scotland, MD 04/29/2022 2:52 PM  For on call review [email or web address].

## 2022-04-29 NOTE — Progress Notes (Signed)
Pt spits out medicines, attempted several ways and times to provide medicine however pt she in non cooperative. Dr was made aware.

## 2022-04-29 NOTE — Inpatient Diabetes Management (Signed)
Inpatient Diabetes Program Recommendations  AACE/ADA: New Consensus Statement on Inpatient Glycemic Control   Target Ranges:  Prepandial:   less than 140 mg/dL      Peak postprandial:   less than 180 mg/dL (1-2 hours)      Critically ill patients:  140 - 180 mg/dL    Latest Reference Range & Units 04/28/22 09:31 04/28/22 13:10 04/28/22 14:55  Glucose-Capillary 70 - 99 mg/dL 343 (H) 194 (H) 191 (H)   Review of Glycemic Control  Latest Reference Range & Units 04/27/22 19:19  Hemoglobin A1C 4.8 - 5.6 % 8.2 (H)   Diabetes history: DM2 Outpatient Diabetes medications: NONE Current orders for Inpatient glycemic control: Novolog 0-7 units Q2H PRN   Inpatient Diabetes Program Recommendations:    Insulin: Please consider ordering CBGs AC&HS and Novolog 0-9 units AC&HS.  HbgA1C:  A1C 8.2% on 04/27/22 indicating an average glucose of 189 mg/dl over the past 2-3 months. Please consider prescribing DM medication at time of discharge back to SNF.  Thanks, Tama Headings RN, MSN, BC-ADM Inpatient Diabetes Coordinator Team Pager [phone] (8a-5p)

## 2022-04-30 ENCOUNTER — Encounter (HOSPITAL_COMMUNITY): Payer: Self-pay | Admitting: Student

## 2022-04-30 DIAGNOSIS — S72001A Fracture of unspecified part of neck of right femur, initial encounter for closed fracture: Secondary | ICD-10-CM | POA: Diagnosis not present

## 2022-04-30 LAB — GLUCOSE, CAPILLARY
Glucose-Capillary: 239 mg/dL — ABNORMAL HIGH (ref 70–99)
Glucose-Capillary: 265 mg/dL — ABNORMAL HIGH (ref 70–99)
Glucose-Capillary: 176 mg/dL — ABNORMAL HIGH (ref 70–99)
Glucose-Capillary: 184 mg/dL — ABNORMAL HIGH (ref 70–99)

## 2022-04-30 LAB — PREPARE RBC (CROSSMATCH)

## 2022-04-30 LAB — CBC
HCT: 22 % — ABNORMAL LOW (ref 36.0–46.0)
Hemoglobin: 7.2 g/dL — ABNORMAL LOW (ref 12.0–15.0)
MCH: 30 pg (ref 26.0–34.0)
MCHC: 32.7 g/dL (ref 30.0–36.0)
MCV: 91.7 fL (ref 80.0–100.0)
Platelets: 138 10*3/uL — ABNORMAL LOW (ref 150–400)
RBC: 2.4 MIL/uL — ABNORMAL LOW (ref 3.87–5.11)
RDW: 13.6 % (ref 11.5–15.5)
WBC: 11.5 10*3/uL — ABNORMAL HIGH (ref 4.0–10.5)
nRBC: 0 % (ref 0.0–0.2)

## 2022-04-30 LAB — BASIC METABOLIC PANEL
Anion gap: 7 (ref 5–15)
BUN: 46 mg/dL — ABNORMAL HIGH (ref 8–23)
CO2: 22 mmol/L (ref 22–32)
Calcium: 8 mg/dL — ABNORMAL LOW (ref 8.9–10.3)
Chloride: 111 mmol/L (ref 98–111)
Creatinine, Ser: 1.68 mg/dL — ABNORMAL HIGH (ref 0.44–1.00)
GFR, Estimated: 29 mL/min — ABNORMAL LOW (ref >60)
Glucose, Bld: 217 mg/dL — ABNORMAL HIGH (ref 70–99)
Potassium: 4.4 mmol/L (ref 3.5–5.1)
Sodium: 140 mmol/L (ref 135–145)

## 2022-04-30 MED ORDER — SODIUM CHLORIDE 0.9% IV SOLUTION: Freq: Once | INTRAVENOUS | Status: AC

## 2022-04-30 MED ORDER — HYDROCODONE-ACETAMINOPHEN 5-325 MG PO TABS: 1.0000 | ORAL_TABLET | ORAL | 0 refills | Status: DC | PRN

## 2022-04-30 MED ORDER — OXYCODONE HCL 5 MG PO TABS
5.0000 mg | ORAL_TABLET | ORAL | Status: DC | PRN
  Administered 2022-04-30 – 2022-05-01 (×3): 5 mg via ORAL
  Filled 2022-04-30 (×3): qty 1

## 2022-04-30 MED ORDER — VITAMIN D3 25 MCG PO TABS: 1000.0000 [IU] | ORAL_TABLET | Freq: Every day | ORAL | 2 refills | Status: AC

## 2022-04-30 NOTE — Care Management Important Message (Signed)
Important Message  Patient Details  Name: Laura Parks MRN: [id number] Date of Birth: Jul 06, 1930   Medicare Important Message Given:  Yes     Hannah Beat 04/30/2022, 2:05 PM

## 2022-04-30 NOTE — Progress Notes (Signed)
Pt refusing cardizem. Re-educated each time in the room. HR persistently 110s to 150s. Pt finally amenable to taking cardizem at 1630

## 2022-04-30 NOTE — Progress Notes (Signed)
IV noted to be leaking during initial blood admin. Blood paused and new IV placed. Blood admin resume following new IV access.

## 2022-04-30 NOTE — Progress Notes (Addendum)
Orthopaedic Trauma Progress Note  SUBJECTIVE: Doing okay this morning.  Pain in right hip and right shoulder are currently well controlled.   No specific complaints currently.  Last ate breakfast.  OBJECTIVE:  Vitals:   04/30/22 1046 04/30/22 1124  BP: 122/65 104/69  Pulse: (!) 111 (!) 114  Resp: 16 (!) 22  Temp: 98.1 F (36.7 C) 98.2 F (36.8 C)  SpO2:      General: Laying in bed, no acute distress.   Respiratory: No increased work of breathing.  Right upper extremity: Sling in place.  Tender over the shoulder.  Tolerates gentle wrist range of motion.  Able to wiggle the fingers.  Notes discomfort with passive elbow motion.  Endorses sensation to light touch through the median, ulnar, radial nerve distribution.  Hand warm and well-perfused neurovascularly intact Right lower extremity: Dressings removed, incisions clean, dry, intact.  Tender over the hip as expected.  Holds foot in slightly plantarflexed position but tolerates gentle ankle DF/PF.  Endorses sensation throughout the extremity.  Able to wiggle toes.  Neurovascularly intact.  Compartment soft compressible.  IMAGING: Stable post op imaging.   LABS:  Results for orders placed or performed during the hospital encounter of 04/27/22 (from the past 24 hour(s))  Glucose, capillary     Status: Abnormal   Collection Time: 04/29/22 12:13 PM  Result Value Ref Range   Glucose-Capillary 305 (H) 70 - 99 mg/dL  Glucose, capillary     Status: Abnormal   Collection Time: 04/29/22  2:23 PM  Result Value Ref Range   Glucose-Capillary 217 (H) 70 - 99 mg/dL  Glucose, capillary     Status: Abnormal   Collection Time: 04/29/22  5:03 PM  Result Value Ref Range   Glucose-Capillary 140 (H) 70 - 99 mg/dL  CBC     Status: Abnormal   Collection Time: 04/30/22  3:34 AM  Result Value Ref Range   WBC 11.5 (H) 4.0 - 10.5 K/uL   RBC 2.40 (L) 3.87 - 5.11 MIL/uL   Hemoglobin 7.2 (L) 12.0 - 15.0 g/dL   HCT 22.0 (L) 36.0 - 46.0 %   MCV 91.7 80.0 -  100.0 fL   MCH 30.0 26.0 - 34.0 pg   MCHC 32.7 30.0 - 36.0 g/dL   RDW 13.6 11.5 - 15.5 %   Platelets 138 (L) 150 - 400 K/uL   nRBC 0.0 0.0 - 0.2 %  Basic metabolic panel     Status: Abnormal   Collection Time: 04/30/22  3:34 AM  Result Value Ref Range   Sodium 140 135 - 145 mmol/L   Potassium 4.4 3.5 - 5.1 mmol/L   Chloride 111 98 - 111 mmol/L   CO2 22 22 - 32 mmol/L   Glucose, Bld 217 (H) 70 - 99 mg/dL   BUN 46 (H) 8 - 23 mg/dL   Creatinine, Ser 1.68 (H) 0.44 - 1.00 mg/dL   Calcium 8.0 (L) 8.9 - 10.3 mg/dL   GFR, Estimated 29 (L) >60 mL/min   Anion gap 7 5 - 15  Glucose, capillary     Status: Abnormal   Collection Time: 04/30/22  7:54 AM  Result Value Ref Range   Glucose-Capillary 239 (H) 70 - 99 mg/dL  Type and screen [place]     Status: None (Preliminary result)   Collection Time: 04/30/22  8:59 AM  Result Value Ref Range   ABO/RH(D) A POS    Antibody Screen NEG    Sample Expiration 05/03/2022,2359  Unit Number [id number]    Blood Component Type RED CELLS,LR    Unit division 00    Status of Unit ISSUED    Transfusion Status OK TO TRANSFUSE    Crossmatch Result      Compatible Performed at [facility], [place] [address], [place], [place] [address]   Prepare RBC (crossmatch)     Status: None   Collection Time: 04/30/22  8:59 AM  Result Value Ref Range   Order Confirmation      ORDER PROCESSED BY BLOOD BANK Performed at [facility], [address], [place], [place] [address]   Glucose, capillary     Status: Abnormal   Collection Time: 04/30/22 11:51 AM  Result Value Ref Range   Glucose-Capillary 265 (H) 70 - 99 mg/dL    ASSESSMENT: Laura Parks is a 88 y.o. female, 2 Days Post-Op s/p INTRAMEDULLARY NAIL RIGHT INTERTROCHANTERIC FEMUR FRACTURE NONOPERATIVE MANAGEMENT RIGHT PROXIMAL HUMERUS FRACTURE  CV/Blood loss: Acute blood loss anemia, Hgb 7.2 this morning.  1 unit PRBCs ordered per primary team  PLAN: Weightbearing:  WBAT RLE, NWB RUE ROM: Unrestricted ROM RLE.  Maintain sling RUE Incisional and dressing care: RLE dressings removed today, okay to leave incisions open to air Showering: Okay to begin showering with assistance and getting incisions wet Orthopedic device(s): Sling RUE Pain management:  1. Tylenol 325-650 mg q 6 hours PRN 2. Robaxin 500 mg q 6 hours PRN 3. Oxycodone 5 mg q 4 hours PRN 4. Morphine 0.5-1 mg q 2 hours PRN VTE prophylaxis:  Hold restarting Xarelto until hemoglobin stabilizes.  SCDs ID:  Ancef 2gm post op completed Foley/Lines:  No foley, KVO IVFs Impediments to Fracture Healing: Vitamin D level pending, will start supplementation as indicated Dispo: Therapy as tolerated, PT/OT recommending SNF.  Continue to monitor CBC and restart Xarelto once hemoglobin stabilizes.  Maintain sling RUE and nonweightbearing precautions x3 weeks. I have ordered a PRAFO boot from the RLE to help maintain neutral dorsiflexion of the ankle.   D/C recommendations: -Norco for pain control -Home dose Xarelto for DVT prophylaxis -Possible need for Vit D supplementation  Follow - up plan:  2 weeks after discharge for wound check and repeat x-rays right femur and right shoulder   Contact information:  Katha Hamming MD, Rushie Nyhan PA-C. After hours and holidays please check [email or web address] for group call information for [facility]   Gwinda Passe, PA-C [phone] (office) [email or web address]

## 2022-04-30 NOTE — Anesthesia Postprocedure Evaluation (Signed)
Anesthesia Post Note  Patient: Laura Parks  Procedure(s) Performed: INTRAMEDULLARY (IM) NAIL INTERTROCHANTERIC (Right: Hip)     Patient location during evaluation: PACU Anesthesia Type: General Level of consciousness: awake and alert Pain management: pain level controlled Vital Signs Assessment: post-procedure vital signs reviewed and stable Respiratory status: spontaneous breathing, nonlabored ventilation, respiratory function stable and patient connected to nasal cannula oxygen Cardiovascular status: blood pressure returned to baseline and stable Postop Assessment: no apparent nausea or vomiting Anesthetic complications: no   No notable events documented.  Last Vitals:  Vitals:   04/30/22 0352 04/30/22 0727  BP: (!) 107/58   Pulse: (!) 110   Resp: 17   Temp: 36.7 C 36.9 C  SpO2: 92% 94%    Last Pain:  Vitals:   04/30/22 0727  TempSrc: Oral  PainSc:                  Tiajuana Amass

## 2022-04-30 NOTE — Progress Notes (Addendum)
PROGRESS NOTE    Laura Parks  VQM:[id number] DOB: [birth year] DOA: 04/27/2022  PCP: Neale Burly, MD   Brief Narrative:  This 88 y.o. female with medical history significant for dementia, diabetes mellitus, atrial fibrillation, legally blind, hypertension, stroke, CHF presented to ED on 8/13 s/p mechanical fall from bed and c/o R hip and R arm pain.  X-ray pelvis comminuted displaced right intertrochanteric femur fracture and right humeral head fracture. Orthopedic is consulted,  Patient underwent ORIF for right femoral fracture,  recommended non weight bearing nonsurgical intervention for right humerus fracture.  Assessment & Plan:   Principal Problem:   Closed right hip fracture (HCC) Active Problems:   Essential hypertension, benign   Type 2 diabetes mellitus (HCC)   CHF (congestive heart failure) (HCC)   PAF (paroxysmal atrial fibrillation) (HCC)   Vascular dementia (HCC)   Humerus head fracture, right, closed, initial encounter  Closed right hip fracture: Status post mechanical fall.   X-ray pelvis- Comminuted displaced fracture in the intertrochanteric portion of proximal right femur.   Ortho consulted, Patient underwent ORIF.  Postoperative day 1. Adequate pain control with morphine as needed. Continue PT/OT evaluation.   Right humeral head fracture: Status post mechanical fall off from the bed.   Right shoulder x-ray- Comminuted fracture of the right humeral head. Orthopedics recommended nonoperative management. Continue sling all the time.  Nonweightbearing RU E.   Vascular dementia: Nursing home resident. Continue Aricept.   Paroxysmal atrial fibrillation: Heart rate remains up to 105-110 Continue Cardizem 120 mg daily Last echo 09/2021 EF of 65 to 70%.  -Hold Xarelto Continue IV metoprolol 5 mg for heart rate > 120   Chronic diastolic CHF: Stable and compensated.   Continue Lasix 20 mg every other day.   Last echo 09/2021, EF of 65 to 70%.   QTc  prolonged at 502.  Previously borderline prolonged.    Type 2 diabetes with hyperglycemia HgbA1c- 8.2%, will need optimal control while inpatient, SSI, long acting ordered Adjusted as needed   Essential hypertension: Stable.  Continue Cardizem 120 mg daily.   Continue Lasix 20 mg daily  Acute blood loss anemia: Hemoglobin dropped from 10.2-7.2 likely postoperative. Transfuse 1 unit PRBC, follow posttransfusion CBC. Monitor H&H.     DVT prophylaxis: SCDs,  Code Status: DNR Family Communication: No family at bedside Disposition Plan:Status is: Inpatient Remains inpatient appropriate because: Admitted s/p mechanical fall with right femoral neck fracture, right humeral head fracture s/p ORIF.  PT and OT pending   Consultants:  Orthopedics  Procedures: ORIF Antimicrobials: None   Subjective: Patient was seen and examined at bedside.  Overnight events noted.  Patient reports feeling better,  Patient is getting blood transfusion for low hemoglobin.  Tolerating well   Objective: Vitals:   04/30/22 0352 04/30/22 0727 04/30/22 1046 04/30/22 1124  BP: (!) 107/58  122/65 104/69  Pulse: (!) 110  (!) 111 (!) 114  Resp: 17  16 (!) 22  Temp: 98 F (36.7 C) 98.5 F (36.9 C) 98.1 F (36.7 C) 98.2 F (36.8 C)  TempSrc: Oral Oral Oral Oral  SpO2: 92% 94%    Weight:      Height:        Intake/Output Summary (Last 24 hours) at 04/30/2022 1502 Last data filed at 04/30/2022 1300 Gross per 24 hour  Intake 1324.06 ml  Output 700 ml  Net 624.06 ml   Filed Weights   04/27/22 1732 04/28/22 0936  Weight: 63.5 kg 63.5 kg  Examination:  General exam: Appears comfortable, not in any acute distress, deconditioned Respiratory system: CTA bilaterally, no wheezing, no crackles, normal respiratory effort. Cardiovascular system: S1 & S2 heard, regular rate and rhythm, no murmur.  Gastrointestinal system: Abdomen is soft, non tender, non distended, BS+ Central nervous system: Alert and  oriented x1 . No focal neurological deficits. Extremities: Right arm in sling, right leg s/p ORIF postoperative day 1.  Tenderness noted Skin: No rashes, lesions or ulcers Psychiatry: Judgement and insight appear normal. Mood & affect appropriate.     Data Reviewed: I have personally reviewed following labs and imaging studies  CBC: Recent Labs  Lab 04/27/22 1919 04/28/22 1918 04/29/22 0229 04/30/22 0334  WBC 15.8* 21.1* 13.6* 11.5*  HGB 12.9 10.2* 7.3* 7.2*  HCT 38.9 31.0* 22.1* 22.0*  MCV 89.6 92.3 90.6 91.7  PLT 177 204 133* 086*   Basic Metabolic Panel: Recent Labs  Lab 04/27/22 1919 04/28/22 0640 04/29/22 0229 04/30/22 0334  NA 136 140 138 140  K 4.4 3.6 4.9 4.4  CL 104 115* 110 111  CO2 23 20* 20* 22  GLUCOSE 275* 304* 299* 217*  BUN 27* 29* 43* 46*  CREATININE 1.37* 1.34* 2.03* 1.68*  CALCIUM 9.1 6.4* 7.4* 8.0*  MG 1.8  --   --   --    GFR: Estimated Creatinine Clearance: 18.9 mL/min (A) (by C-G formula based on SCr of 1.68 mg/dL (H)). Liver Function Tests: No results for input(s): "AST", "ALT", "ALKPHOS", "BILITOT", "PROT", "ALBUMIN" in the last 168 hours. No results for input(s): "LIPASE", "AMYLASE" in the last 168 hours. No results for input(s): "AMMONIA" in the last 168 hours. Coagulation Profile: No results for input(s): "INR", "PROTIME" in the last 168 hours. Cardiac Enzymes: No results for input(s): "CKTOTAL", "CKMB", "CKMBINDEX", "TROPONINI" in the last 168 hours. BNP (last 3 results) No results for input(s): "PROBNP" in the last 8760 hours. HbA1C: Recent Labs    04/27/22 1919  HGBA1C 8.2*   CBG: Recent Labs  Lab 04/29/22 1213 04/29/22 1423 04/29/22 1703 04/30/22 0754 04/30/22 1151  GLUCAP 305* 217* 140* 239* 265*   Lipid Profile: No results for input(s): "CHOL", "HDL", "LDLCALC", "TRIG", "CHOLHDL", "LDLDIRECT" in the last 72 hours. Thyroid Function Tests: No results for input(s): "TSH", "T4TOTAL", "FREET4", "T3FREE", "THYROIDAB" in  the last 72 hours. Anemia Panel: No results for input(s): "VITAMINB12", "FOLATE", "FERRITIN", "TIBC", "IRON", "RETICCTPCT" in the last 72 hours. Sepsis Labs: No results for input(s): "PROCALCITON", "LATICACIDVEN" in the last 168 hours.  No results found for this or any previous visit (from the past 240 hour(s)).   Radiology Studies: DG HIP PORT UNILAT W OR W/O PELVIS 1V RIGHT  Result Date: 04/28/2022 CLINICAL DATA:  Status post right femur surgery. EXAM: DG HIP (WITH OR WITHOUT PELVIS) 1V PORT RIGHT COMPARISON:  Hip radiographs dated 1 day prior, intraoperative images obtained earlier the same day. FINDINGS: The patient is status post intramedullary rod and screw fixation of the right femur. The previously seen displaced intertrochanteric fracture is significantly improved in alignment. Hardware alignment is within expected limits, without evidence of immediate complication. Postsurgical changes are also noted in the right knee. Left femoral hardware is incompletely evaluated but within normal limits to the level imaged. Femoroacetabular alignment is normal bilaterally. The SI joints and symphysis pubis are intact. IMPRESSION: Status post internal fixation of the right intertrochanteric fracture with significantly improved fracture alignment. No evidence of immediate complication. Electronically Signed   By: Valetta Mole M.D.   On: 04/28/2022  15:55    Scheduled Meds:  cholecalciferol  1,000 Units Oral Daily   citalopram  20 mg Oral Daily   cyanocobalamin  1,000 mcg Oral Daily   diltiazem  120 mg Oral Daily   docusate sodium  100 mg Oral BID   donepezil  10 mg Oral QHS   feeding supplement  237 mL Oral BID BM   ferrous sulfate  300 mg Oral BID WC   furosemide  20 mg Oral Daily   insulin aspart  0-15 Units Subcutaneous TID WC   insulin aspart  0-5 Units Subcutaneous QHS   insulin detemir  5 Units Subcutaneous QHS   melatonin  6 mg Oral QHS   metoprolol tartrate  5 mg Intravenous Q6H    multivitamin with minerals  1 tablet Oral Daily   Continuous Infusions:  methocarbamol (ROBAXIN) IV       LOS: 3 days    Time spent: 35 mins    Naasir Carreira, MD [facility]   If 7PM-7AM, please contact night-coverage

## 2022-04-30 NOTE — TOC Progression Note (Addendum)
Transition of Care [facility]) - Progression Note    Patient Details  Name: Laura Parks MRN: [id number] Date of Birth: 03-09-1930  Transition of Care [facility]) CM/SW Contact  Joanne Chars, LCSW Phone Number: 04/30/2022, 10:35 AM  Clinical Narrative:  CSW spoke with Lawerance [facility], [phone] who is here at the hospital.  Pt did not have to revoke hospice for surgery, they are still involved and will continue at [place] spoke with Ebony Hail at [facility], they can accept pt today, no auth needed due to hospice.   CSW spoke with son Kasandra Knudsen in room, he confirms that he does want to move forward with the above plan.   1130: per MD, pt not stable for DC today.  SNF and hospice both notified.  Expected Discharge Plan: Skilled Nursing Facility Barriers to Discharge: Continued Medical Work up  Expected Discharge Plan and Services Expected Discharge Plan: [place]                                               Social Determinants of Health (SDOH) Interventions    Readmission Risk Interventions     No data to display

## 2022-04-30 NOTE — NC FL2 (Signed)
[place] LEVEL OF CARE SCREENING TOOL     IDENTIFICATION  Patient Name: Laura Parks Birthdate: May 24, 1930 Sex: female Admission Date (Current Location): 04/27/2022  [place] and [place] Number:  Kathleen Argue [id number] [place] and Address:  [facility] [place] 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   Fracture of ramus of left pubis with routine healing 04/09/2020   Closed left hip fracture ([place]) 06/09/2019   Renal insufficiency 06/09/2019   Blood in the stool 06/18/2016   Family hx of colon cancer 06/18/2016   Absolute anemia 76/28/3151   Embolic stroke ([place]) 76/16/0737   Cerebrovascular accident (CVA) ([place])    Cerebral infarction (hemorrhagic or thromboembolic)    Acute delirium 12/31/2015   Vascular dementia ([place]) 12/31/2015   PAF (paroxysmal atrial fibrillation) ([place])    Palliative care encounter    Acute renal failure ([place])    Altered mental status    Stroke [facility])    NSTEMI (non-ST elevated myocardial infarction) ([place])    Hyperkalemia 12/27/2015   CHF (congestive heart failure) ([place]) 12/27/2015   Acute encephalopathy 12/27/2015   TIA (transient ischemic attack) 12/27/2015   AKI (acute kidney injury) ([place]) 12/27/2015   Dehydration 12/27/2015   Legal blindness 12/27/2015   B12 deficiency 09/02/2014   Gait abnormality 07/26/2014   Delusions ([place])  07/26/2014   Depression 07/26/2014   Cognitive impairment    Type 2 diabetes mellitus ([place]) 02/02/2014   Carotid artery occlusion 02/02/2014   PVC's (premature ventricular contractions) 02/02/2014   Mixed hyperlipidemia 02/23/2012   Essential hypertension, benign 02/23/2012    Orientation RESPIRATION BLADDER Height & Weight     Self  Normal Incontinent Weight: 140 lb (63.5 kg) Height:  5' (152.4 cm)  BEHAVIORAL SYMPTOMS/MOOD NEUROLOGICAL BOWEL NUTRITION STATUS      Continent Diet (see discharge summary)  AMBULATORY STATUS COMMUNICATION OF NEEDS Skin   Total Care Verbally Surgical wounds                       Personal Care Assistance Level of Assistance  Bathing, Feeding, Dressing Bathing Assistance: Maximum assistance Feeding assistance: Limited assistance Dressing Assistance: Maximum assistance     Functional Limitations Info  Sight, Hearing, Speech Sight Info: Impaired Hearing Info: Impaired Speech Info: Adequate    SPECIAL CARE FACTORS FREQUENCY                       Contractures Contractures Info: Not present    Additional Factors Info  Code Status, Allergies Code Status Info: DNR Allergies Info: codiene           Current Medications (04/30/2022):  This is the current hospital active medication list Current Facility-Administered Medications  Medication Dose Route Frequency Provider Last Rate Last Admin   acetaminophen (TYLENOL) tablet 325-650 mg  325-650 mg Oral Q6H PRN Corinne Ports, PA-C  cholecalciferol (VITAMIN D3) 25 MCG (1000 UNIT) tablet 1,000 Units  1,000 Units Oral Daily Alanda Amass, Aseem, MD       citalopram (CELEXA) tablet 20 mg  20 mg Oral Daily Alanda Amass, Aseem, MD   20 mg at 04/29/22 1011   cyanocobalamin (VITAMIN B12) tablet 1,000 mcg  1,000 mcg Oral Daily Vanna Scotland, MD   1,000 mcg at 04/29/22 1010   diltiazem (CARDIZEM) tablet 120 mg  120 mg Oral Daily Alanda Amass, Aseem, MD       docusate sodium (COLACE) capsule 100 mg  100 mg Oral BID  Corinne Ports, PA-C   100 mg at 04/28/22 2130   donepezil (ARICEPT) tablet 10 mg  10 mg Oral QHS Corinne Ports, PA-C   10 mg at 04/28/22 2130   feeding supplement (ENSURE ENLIVE / ENSURE PLUS) liquid 237 mL  237 mL Oral BID BM Alanda Amass, Aseem, MD   237 mL at 04/30/22 0937   ferrous sulfate 300 (60 Fe) MG/5ML syrup 300 mg  300 mg Oral BID WC Alanda Amass, Aseem, MD       furosemide (LASIX) tablet 20 mg  20 mg Oral Daily Alanda Amass, Aseem, MD       insulin aspart (novoLOG) injection 0-15 Units  0-15 Units Subcutaneous TID WC Alanda Amass, Aseem, MD   5 Units at 04/30/22 0830   insulin aspart (novoLOG) injection 0-5 Units  0-5 Units Subcutaneous QHS Alanda Amass, Aseem, MD       insulin detemir (LEVEMIR) injection 5 Units  5 Units Subcutaneous QHS Vanna Scotland, MD   5 Units at 04/30/22 0019   LORazepam (ATIVAN) tablet 0.5 mg  0.5 mg Oral BID PRN Corinne Ports, PA-C   0.5 mg at 04/29/22 1022   melatonin tablet 6 mg  6 mg Oral QHS Vanna Scotland, MD       methocarbamol (ROBAXIN) tablet 500 mg  500 mg Oral Q6H PRN Corinne Ports, PA-C   500 mg at 04/29/22 1022   Or   methocarbamol (ROBAXIN) 500 mg in dextrose 5 % 50 mL IVPB  500 mg Intravenous Q6H PRN McClung, Sarah A, PA-C       metoCLOPramide (REGLAN) tablet 5-10 mg  5-10 mg Oral Q8H PRN Thereasa Solo, Sarah A, PA-C       Or   metoCLOPramide (REGLAN) injection 5-10 mg  5-10 mg Intravenous Q8H PRN McClung, Sarah A, PA-C       metoprolol tartrate (LOPRESSOR) injection 5 mg  5 mg Intravenous Q6H Kaul, Aseem, MD   5 mg at 04/30/22 0459   morphine (PF) 2 MG/ML injection 0.5-1 mg  0.5-1 mg Intravenous Q2H PRN Corinne Ports, PA-C   1 mg at 04/30/22 0018   multivitamin with minerals tablet 1 tablet  1 tablet Oral Daily Vanna Scotland, MD       ondansetron (ZOFRAN) tablet 4 mg  4 mg Oral Q6H PRN Thereasa Solo, Sarah A, PA-C       Or   ondansetron (ZOFRAN) injection 4 mg  4 mg Intravenous Q6H PRN McClung, Sarah A, PA-C       oxyCODONE (Oxy IR/ROXICODONE) immediate release tablet 5 mg  5 mg Oral  Q4H PRN McClung, Sarah A, PA-C       polyethylene glycol (MIRALAX / GLYCOLAX) packet 17 g  17 g Oral Daily PRN Corinne Ports, PA-C         Discharge Medications: Please see discharge summary for a list of discharge medications.  Relevant Imaging Results:  Relevant Lab Results:  Additional Information SSN: [id number]  Joanne Chars, LCSW

## 2022-04-30 NOTE — Plan of Care (Signed)
  Problem: Health Behavior/Discharge Planning: Goal: Ability to manage health-related needs will improve Outcome: Progressing   

## 2022-05-01 LAB — CBC
HCT: 25.8 % — ABNORMAL LOW (ref 36.0–46.0)
Hemoglobin: 8.6 g/dL — ABNORMAL LOW (ref 12.0–15.0)
MCH: 29.6 pg (ref 26.0–34.0)
MCHC: 33.3 g/dL (ref 30.0–36.0)
MCV: 88.7 fL (ref 80.0–100.0)
Platelets: 125 10*3/uL — ABNORMAL LOW (ref 150–400)
RBC: 2.91 MIL/uL — ABNORMAL LOW (ref 3.87–5.11)
RDW: 16.5 % — ABNORMAL HIGH (ref 11.5–15.5)
WBC: 11.8 10*3/uL — ABNORMAL HIGH (ref 4.0–10.5)
nRBC: 0.3 % — ABNORMAL HIGH (ref 0.0–0.2)

## 2022-05-01 LAB — BASIC METABOLIC PANEL
Anion gap: 5 (ref 5–15)
BUN: 39 mg/dL — ABNORMAL HIGH (ref 8–23)
CO2: 24 mmol/L (ref 22–32)
Calcium: 8.4 mg/dL — ABNORMAL LOW (ref 8.9–10.3)
Chloride: 111 mmol/L (ref 98–111)
Creatinine, Ser: 1.2 mg/dL — ABNORMAL HIGH (ref 0.44–1.00)
GFR, Estimated: 43 mL/min — ABNORMAL LOW (ref >60)
Glucose, Bld: 138 mg/dL — ABNORMAL HIGH (ref 70–99)
Potassium: 3.9 mmol/L (ref 3.5–5.1)
Sodium: 140 mmol/L (ref 135–145)

## 2022-05-01 LAB — PHOSPHORUS: Phosphorus: 2 mg/dL — ABNORMAL LOW (ref 2.5–4.6)

## 2022-05-01 LAB — TYPE AND SCREEN
ABO/RH(D): A POS
Antibody Screen: NEGATIVE
Unit division: 0

## 2022-05-01 LAB — MAGNESIUM: Magnesium: 1.9 mg/dL (ref 1.7–2.4)

## 2022-05-01 LAB — GLUCOSE, CAPILLARY
Glucose-Capillary: 128 mg/dL — ABNORMAL HIGH (ref 70–99)
Glucose-Capillary: 190 mg/dL — ABNORMAL HIGH (ref 70–99)

## 2022-05-01 LAB — BPAM RBC
Blood Product Expiration Date: 202309032359
ISSUE DATE / TIME: 202308161033
Unit Type and Rh: 6200

## 2022-05-01 MED ORDER — K PHOS MONO-SOD PHOS DI & MONO 155-852-130 MG PO TABS: 250.0000 mg | ORAL_TABLET | Freq: Three times a day (TID) | ORAL | 0 refills | Status: AC

## 2022-05-01 MED ORDER — K PHOS MONO-SOD PHOS DI & MONO 155-852-130 MG PO TABS
250.0000 mg | ORAL_TABLET | Freq: Three times a day (TID) | ORAL | Status: DC
  Administered 2022-05-01: 250 mg via ORAL
  Filled 2022-05-01: qty 1

## 2022-05-01 MED ORDER — RIVAROXABAN 15 MG PO TABS
15.0000 mg | ORAL_TABLET | Freq: Every day | ORAL | Status: DC
  Filled 2022-05-01: qty 1

## 2022-05-01 NOTE — Discharge Summary (Addendum)
Physician Discharge Summary  Laura Parks DJS:[id number] DOB: 11/08/1929 DOA: 04/27/2022  PCP: Neale Burly, MD  Admit date: 04/27/2022  Discharge date: 05/01/2022  Admitted From: Home.  Disposition: SNF [facility] rehab)  Recommendations for Outpatient Follow-up:  Follow up with PCP in 1-2 weeks. Please obtain BMP/CBC in one week Advised to follow-up with Dr. Verlon Setting in 2 weeks. Advised to take Xarelto regularly for postoperative DVT prophylaxis. Advised to take Norco as needed for severe pain.  Home Health: None Equipment/Devices: None  Discharge Condition: Stable CODE STATUS:Full code Diet recommendation: Heart Healthy   Brief [facility] Course: This 88 y.o. female with medical history significant for dementia, diabetes mellitus, atrial fibrillation, legally blind, hypertension, stroke, CHF presented to ED on 8/13 s/p mechanical fall from bed and has developed R hip and R arm pain. X-ray pelvis showed comminuted displaced right intertrochanteric femur fracture and right humeral head fracture.Orthopedics is consulted, Patient underwent ORIF for right femoral fracture,  recommended non weight bearing nonsurgical intervention for right humerus fracture.  Patient was managed postoperatively well.  She participated in physical therapy recommended skilled nursing facility for rehab. Ortho recommended nonsurgical intervention for right upper extremity suggested keep in sling all the time..  Patient is being discharged to skilled nursing facility for rehab.  Discharge Diagnoses:  Principal Problem:   Closed right hip fracture ([place]) Active Problems:   Essential hypertension, benign   Type 2 diabetes mellitus (HCC)   CHF (congestive heart failure) (HCC)   PAF (paroxysmal atrial fibrillation) (HCC)   Vascular dementia (HCC)   Humerus head fracture, right, closed, initial encounter  Closed right hip fracture: Status post mechanical fall.   X-ray pelvis- Comminuted displaced fracture  in the intertrochanteric portion of proximal right femur.   Ortho consulted, Patient underwent ORIF.  Postoperative day 2. Adequate pain control with morphine as needed. PT and OT recommended skilled nursing facility.  Ortho signed off Patient is to follow-up with Dr. Verlon Setting in 2 weeks.   Right humeral head fracture: Status post mechanical fall off from the bed.   Right shoulder x-ray- Comminuted fracture of the right humeral head. Orthopedics recommended non operative management. Continue sling all the time.  Nonweightbearing RUE.   Vascular dementia: Nursing home resident. Continue Aricept.   Paroxysmal atrial fibrillation: Heart rate remains up to 105-110, now controlled. Continue Cardizem 120 mg daily Last echo 09/2021 EF of 65 to 70%.  Xarelto resumed.  Chronic diastolic CHF: Stable and compensated.   Continue Lasix 20 mg every other day.   Last echo 09/2021, EF of 65 to 70%.   QTc prolonged at 502.  Previously borderline prolonged.    Type 2 diabetes with hyperglycemia HgbA1c- 8.2%, will need optimal control while inpatient, SSI, long acting ordered Adjusted as needed   Essential hypertension: Stable.  Continue Cardizem 120 mg daily.   Continue Lasix 20 mg daily   Acute blood loss anemia: Hemoglobin dropped from 10.2-7.2 likely postoperative. Transfuse 1 unit PRBC, post transfusion hemoglobin 8.6 H&H remained stable afterwards.     Discharge Instructions  Discharge Instructions     Call MD for:  difficulty breathing, headache or visual disturbances   Complete by: As directed    Call MD for:  persistant dizziness or light-headedness   Complete by: As directed    Call MD for:  severe uncontrolled pain   Complete by: As directed    Diet - low sodium heart healthy   Complete by: As directed    Diet Carb Modified  Complete by: As directed    Discharge instructions   Complete by: As directed    Advised to follow-up with primary care physician in 1  week. Advised to follow-up with Dr. Verlon Setting in 2 weeks. Advised to take Eliquis regularly for postoperative DVT prophylaxis. Advised to take Norco as needed for severe pain.   Discharge wound care:   Complete by: As directed    Follow-up orthopedics Dr. Verlon Setting in 2 weeks.   Increase activity slowly   Complete by: As directed       Allergies as of 05/01/2022       Reactions   Codeine Nausea Only        Medication List     STOP taking these medications    acetaminophen 325 MG tablet Commonly known as: TYLENOL   Ativan 0.5 MG tablet Generic drug: LORazepam   donepezil 10 MG tablet Commonly known as: ARICEPT   MYLANTA PO       TAKE these medications    citalopram 20 MG tablet Commonly known as: CELEXA Take 20 mg by mouth daily.   cyanocobalamin 1000 MCG tablet Take 1,000 mcg by mouth daily.   diltiazem 120 MG tablet Commonly known as: CARDIZEM Take 120 mg by mouth daily.   Ferrous Sulfate 220 (44 Fe) MG/5ML Soln Take 220 mg by mouth 2 (two) times daily with a meal.   furosemide 20 MG tablet Commonly known as: LASIX Take 20 mg by mouth every other day.   HYDROcodone-acetaminophen 5-325 MG tablet Commonly known as: NORCO/VICODIN Take 1 tablet by mouth every 4 (four) hours as needed for severe pain or moderate pain.   melatonin 3 MG Tabs tablet Take 6 mg by mouth at bedtime.   OCUVITE PO Take 1 tablet by mouth 2 (two) times daily.   OSCAL 500/200 D-3 PO Take 1 tablet by mouth 2 (two) times daily.   phosphorus 155-852-130 MG tablet Commonly known as: K PHOS NEUTRAL Take 1 tablet (250 mg total) by mouth 3 (three) times daily for 3 days.   polyethylene glycol 17 g packet Commonly known as: MIRALAX / GLYCOLAX Take 17 g by mouth daily as needed for mild constipation.   Rivaroxaban 15 MG Tabs tablet Commonly known as: XARELTO Take 1 tablet (15 mg total) by mouth daily with supper.   sennosides-docusate sodium 8.6-50 MG tablet Commonly known as:  SENOKOT-S Take 1 tablet by mouth daily.   traMADol 50 MG tablet Commonly known as: ULTRAM Take 25 mg by mouth in the morning, at noon, and at bedtime.   VITAMIN C PO Take 1 tablet by mouth 2 (two) times daily.   vitamin D3 25 MCG tablet Commonly known as: CHOLECALCIFEROL Take 1 tablet (1,000 Units total) by mouth daily.               Discharge Care Instructions  (From admission, onward)           Start     Ordered   05/01/22 0000  Discharge wound care:       Comments: Follow-up orthopedics Dr. Verlon Setting in 2 weeks.   05/01/22 1018            Follow-up Information     Hasanaj, Samul Dada, MD Follow up in 1 week(s).   Specialty: Internal Medicine Contact information: [place] [address] 616 [phone]         Satira Sark, MD .   Specialty: Cardiology Contact information: [address] [place] [address] [phone]         Shona Needles, MD Follow up in 2 week(s).   Specialty: Orthopedic Surgery Contact information: [place] [address] [phone]                Allergies  Allergen Reactions   Codeine Nausea Only    Consultations: Orthopedics   Procedures/Studies: DG HIP PORT UNILAT W OR W/O PELVIS 1V RIGHT  Result Date: 04/28/2022 CLINICAL DATA:  Status post right femur surgery. EXAM: DG HIP (WITH OR WITHOUT PELVIS) 1V PORT RIGHT COMPARISON:  Hip radiographs dated 1 day prior, intraoperative images obtained earlier the same day. FINDINGS: The patient is status post intramedullary rod and screw fixation of the right femur. The previously seen displaced intertrochanteric fracture is significantly improved in alignment. Hardware alignment is within expected limits, without evidence of immediate complication. Postsurgical changes are also noted in the right knee. Left femoral hardware is incompletely evaluated but within normal limits to the level imaged. Femoroacetabular alignment is  normal bilaterally. The SI joints and symphysis pubis are intact. IMPRESSION: Status post internal fixation of the right intertrochanteric fracture with significantly improved fracture alignment. No evidence of immediate complication. Electronically Signed   By: Valetta Mole M.D.   On: 04/28/2022 15:55   DG FEMUR, MIN 2 VIEWS RIGHT  Result Date: 04/28/2022 CLINICAL DATA:  Femur fracture EXAM: RIGHT FEMUR 2 VIEWS COMPARISON:  the previous day's study FINDINGS: A series of fluoroscopic spot images document placement IM rod with 2 sliding screws across intertrochanteric femur fracture, and a distal interlocking screw. Fracture fragments in near anatomic alignment. Stable orthopedic screws across  old healed patellar fracture. IMPRESSION: Internal fixation of right intertrochanteric femur fracture Electronically Signed   By: Lucrezia Europe M.D.   On: 04/28/2022 15:18   DG C-Arm 1-60 Min-No Report  Result Date: 04/28/2022 Fluoroscopy was utilized by the requesting physician.  No radiographic interpretation.   CT Cervical Spine Wo Contrast  Result Date: 04/27/2022 CLINICAL DATA:  Fall out of bed EXAM: CT HEAD WITHOUT CONTRAST CT CERVICAL SPINE WITHOUT CONTRAST TECHNIQUE: Multidetector CT imaging of the head and cervical spine was performed following the standard protocol without intravenous contrast. Multiplanar CT image reconstructions of the cervical spine were also generated. RADIATION DOSE REDUCTION: This exam was performed according to the departmental dose-optimization program which includes automated exposure control, adjustment of the mA and/or kV according to patient size and/or use of iterative reconstruction technique. COMPARISON:  MRI brain Jan 30, 2016. FINDINGS: CT HEAD FINDINGS Brain: Age-related global parenchymal volume loss. Patchy and more confluent subcortical and periventricular white matter hypodensities are nonspecific but are similar prior brain MRI and most consistent with chronic ischemic  small vessel microvascular disease. No evidence of acute large vascular territory infarction, hemorrhage, hydrocephalus extra-axial collection or mass lesion/mass effect. Vascular: No hyperdense vessel. Atherosclerotic calcifications of the internal carotid and vertebral arteries at the skull base. Skull: Normal. Negative for fracture or focal lesion. Sinuses/Orbits: Visualized portions of the paranasal sinuses are predominantly clear. Orbits are grossly unremarkable. Other: Mastoid air cells are predominantly clear. CT CERVICAL SPINE FINDINGS Alignment: Straightening of the normal cervical lordosis commonly positional. No evidence of traumatic listhesis. Degenerative grade 1 L3 on L4 anterolisthesis. Skull base and vertebrae: No acute displaced fracture identified. Irregularity of the dens with small anterior erosions and a partially mineralized pannus. Soft tissues and spinal canal: No prevertebral fluid or swelling. No visible canal hematoma. Disc levels: Multilevel degenerative  changes spine with disc space narrowing, osteophyte formation, uncovertebral/facet hypertrophy and ligamentum flavum thickening/mineralization with multifocal neural foraminal and spinal canal narrowing most prominent at C5-C6. Upper chest: No acute abnormality. Other: Carotid artery calcifications. IMPRESSION: 1. No acute intracranial abnormality. 2. Similar age-related global parenchymal volume loss with moderate burden of chronic ischemic small vessel white matter disease. 3. No convincing evidence of acute fracture or traumatic listhesis of the cervical spine. 4. Irregularity of the dens with small anterior erosions and a partially mineralized pannus. 5. Multilevel degenerative change of the spine with multifocal neural foraminal and spinal canal narrowing most prominent at C5-C6. Electronically Signed   By: Dahlia Bailiff M.D.   On: 04/27/2022 19:13   CT Head Wo Contrast  Result Date: 04/27/2022 CLINICAL DATA:  Trauma EXAM: CT  HEAD WITHOUT CONTRAST TECHNIQUE: Contiguous axial images were obtained from the base of the skull through the vertex without intravenous contrast. RADIATION DOSE REDUCTION: This exam was performed according to the departmental dose-optimization program which includes automated exposure control, adjustment of the mA and/or kV according to patient size and/or use of iterative reconstruction technique. COMPARISON:  01/30/2016 FINDINGS: Brain: There is patient motion in some of the images. Additional images were repeated. No acute intracranial findings are seen. There are no signs of bleeding within the cranium. Cortical sulci are prominent, more so in both frontal regions. There is decreased density in periventricular white matter. Vascular: Scattered arterial calcifications are seen. Skull: No fracture is seen. Sinuses/Orbits: Unremarkable. Other: None. IMPRESSION: No acute intracranial findings are seen in noncontrast CT brain. Atrophy. Small vessel disease. Electronically Signed   By: Elmer Picker M.D.   On: 04/27/2022 19:05   DG Chest 1 View  Result Date: 04/27/2022 CLINICAL DATA:  Right-sided pain after fall EXAM: CHEST  1 VIEW COMPARISON:  Radiographs 02/20/2020 FINDINGS: Mildly displaced fractures of the anterolateral right fourth and fifth ribs. Partially visualized comminuted fracture of the right humeral head. Stable cardiomediastinal silhouette. Mitral annular calcification. Aortic calcification. No focal consolidation, pleural effusion, or pneumothorax. Linear atelectasis in the lingula. Mild pulmonary vascular congestion. IMPRESSION: 1. Mildly displaced fractures of the right anterolateral fourth and fifth ribs. No pneumothorax. 2. Partially visualized comminuted fracture of the proximal right humeral head. 3. Cardiomegaly and pulmonary vascular congestion. Electronically Signed   By: Placido Sou M.D.   On: 04/27/2022 18:57   DG Shoulder Right  Result Date: 04/27/2022 CLINICAL DATA:   Fall out of bed onto right side now with right arm pain EXAM: RIGHT SHOULDER - 2+ VIEW COMPARISON:  Radiographs 02/20/2020 FINDINGS: Acute comminuted mildly displaced fracture of the anatomical neck of the humerus. Mild lateral displacement of the greater tuberosity. Question additional fracture of the lesser tuberosity. No evidence of dislocation. No additional fractures. Soft tissue swelling about the shoulder. IMPRESSION: Comminuted fracture of the right humeral head. Electronically Signed   By: Placido Sou M.D.   On: 04/27/2022 18:52   DG Hip Unilat W or Wo Pelvis 2-3 Views Right  Result Date: 04/27/2022 CLINICAL DATA:  Trauma, fall EXAM: DG HIP (WITH OR WITHOUT PELVIS) 2-3V RIGHT COMPARISON:  None Available. FINDINGS: There is comminuted intertrochanteric fracture of proximal right femur. There is overriding of fracture fragments. There is 2 cm offset in alignment of lateral cortical margins in proximal femur. There is previous internal fixation in left femur. Vascular calcifications are seen. Degenerative changes are noted in lumbar spine. IMPRESSION: Comminuted displaced fracture is seen in the intertrochanteric portion of proximal right femur. Electronically Signed  By: Elmer Picker M.D.   On: 04/27/2022 18:50    S/p ORIF postoperative day 2   Subjective: Patient was seen and examined at bedside.  Overnight events noted.   Patient is s/p ORIF postoperative day 2.  Tolerating well.   She feels better,  patient is being discharged to SNF today.  Discharge Exam: Vitals:   05/01/22 0507 05/01/22 0717  BP: 113/63   Pulse: 79   Resp: 16   Temp: 98.1 F (36.7 C) 98 F (36.7 C)  SpO2: 94%    Vitals:   04/30/22 1950 05/01/22 0000 05/01/22 0507 05/01/22 0717  BP: 111/60 118/73 113/63   Pulse: 73 79 79   Resp: '19 18 16   '$ Temp: 98.2 F (36.8 C)  98.1 F (36.7 C) 98 F (36.7 C)  TempSrc: Oral  Axillary Axillary  SpO2: 93%  94%   Weight:      Height:        General: Pt  is alert, awake, not in acute distress Cardiovascular: RRR, S1/S2 +, no rubs, no gallops Respiratory: CTA bilaterally, no wheezing, no rhonchi Abdominal: Soft, NT, ND, bowel sounds + Extremities: Right leg ORIF, ambulating, right UE in sling    The results of significant diagnostics from this hospitalization (including imaging, microbiology, ancillary and laboratory) are listed below for reference.     Microbiology: No results found for this or any previous visit (from the past 240 hour(s)).   Labs: BNP (last 3 results) No results for input(s): "BNP" in the last 8760 hours. Basic Metabolic Panel: Recent Labs  Lab 04/27/22 1919 04/28/22 0640 04/29/22 0229 04/30/22 0334 05/01/22 0717  NA 136 140 138 140 140  K 4.4 3.6 4.9 4.4 3.9  CL 104 115* 110 111 111  CO2 23 20* 20* 22 24  GLUCOSE 275* 304* 299* 217* 138*  BUN 27* 29* 43* 46* 39*  CREATININE 1.37* 1.34* 2.03* 1.68* 1.20*  CALCIUM 9.1 6.4* 7.4* 8.0* 8.4*  MG 1.8  --   --   --  1.9  PHOS  --   --   --   --  2.0*   Liver Function Tests: No results for input(s): "AST", "ALT", "ALKPHOS", "BILITOT", "PROT", "ALBUMIN" in the last 168 hours. No results for input(s): "LIPASE", "AMYLASE" in the last 168 hours. No results for input(s): "AMMONIA" in the last 168 hours. CBC: Recent Labs  Lab 04/27/22 1919 04/28/22 1918 04/29/22 0229 04/30/22 0334 05/01/22 0717  WBC 15.8* 21.1* 13.6* 11.5* 11.8*  HGB 12.9 10.2* 7.3* 7.2* 8.6*  HCT 38.9 31.0* 22.1* 22.0* 25.8*  MCV 89.6 92.3 90.6 91.7 88.7  PLT 177 204 133* 138* 125*   Cardiac Enzymes: No results for input(s): "CKTOTAL", "CKMB", "CKMBINDEX", "TROPONINI" in the last 168 hours. BNP: Invalid input(s): "POCBNP" CBG: Recent Labs  Lab 04/30/22 1151 04/30/22 1628 04/30/22 2007 05/01/22 0716 05/01/22 1148  GLUCAP 265* 176* 184* 128* 190*   D-Dimer No results for input(s): "DDIMER" in the last 72 hours. Hgb A1c No results for input(s): "HGBA1C" in the last 72  hours. Lipid Profile No results for input(s): "CHOL", "HDL", "LDLCALC", "TRIG", "CHOLHDL", "LDLDIRECT" in the last 72 hours. Thyroid function studies No results for input(s): "TSH", "T4TOTAL", "T3FREE", "THYROIDAB" in the last 72 hours.  Invalid input(s): "FREET3" Anemia work up No results for input(s): "VITAMINB12", "FOLATE", "FERRITIN", "TIBC", "IRON", "RETICCTPCT" in the last 72 hours. Urinalysis    Component Value Date/Time   COLORURINE YELLOW 06/09/2019 [place] 06/09/2019 0318  LABSPEC 1.015 06/09/2019 0318   PHURINE 7.0 06/09/2019 0318   GLUCOSEU 150 (A) 06/09/2019 0318   HGBUR NEGATIVE 06/09/2019 0318   BILIRUBINUR NEGATIVE 06/09/2019 0318   KETONESUR NEGATIVE 06/09/2019 0318   PROTEINUR 100 (A) 06/09/2019 0318   NITRITE NEGATIVE 06/09/2019 0318   LEUKOCYTESUR NEGATIVE 06/09/2019 0318   Sepsis Labs Recent Labs  Lab 04/28/22 1918 04/29/22 0229 04/30/22 0334 05/01/22 0717  WBC 21.1* 13.6* 11.5* 11.8*   Microbiology No results found for this or any previous visit (from the past 240 hour(s)).   Time coordinating discharge: Over 30 minutes  SIGNED:   Shawna Clamp, MD  [facility] 05/01/2022, 12:06 PM Pager   If 7PM-7AM, please contact night-coverage

## 2022-05-01 NOTE — Progress Notes (Signed)
Report called to RN at [facility]. All questions answered. [facility]AR to transport

## 2022-05-01 NOTE — TOC Transition Note (Signed)
Transition of Care [facility]) - CM/SW Discharge Note   Patient Details  Name: Laura Parks MRN: [id number] Date of Birth: 1929-11-29  Transition of Care [facility]) CM/SW Contact:  Joanne Chars, LCSW Phone Number: 05/01/2022, 12:10 PM   Clinical Narrative:   Pt discharging to [facility]. RN call report to [phone].   CSW attempted to call [facility] to see if they had their own transportation but did not receive return call.     Final next level of care: Skilled Nursing Facility Barriers to Discharge: Barriers Resolved   Patient Goals and CMS Choice        Discharge Placement              Patient chooses bed at:  [facility]) Patient to be transferred to facility by: [place] Name of family member notified: son Kasandra Knudsen Patient and family notified of of transfer: 05/01/22  Discharge Plan and Services                                     Social Determinants of Health (SDOH) Interventions     Readmission Risk Interventions     No data to display

## 2022-05-01 NOTE — Progress Notes (Signed)
Physical Therapy Treatment Patient Details Name: Laura Parks MRN: [id number] DOB: 1930-08-08 Today's Date: 05/01/2022   History of Present Illness Pt is an 88 y/o F presenting to ED on 8/13 from [facility], fell out of bed onto R side, imaging revealing R intertrchanteric femur fx and R proximal humerus fx. S/p cephalomedullary nailing of R femur fx, nonoperative mgmt wtih sling an NWB of humerus fx. PMH includes anemia, cognitive impairments, legally blind, CAD, HTN, CVA, HLD, and DM2    PT Comments    Pt admitted with above diagnosis. Pt not following all commands.  Pt can sit EOB with min guard assist however has difficulty with weight bearing to transfer.  Will need to attempt lateral scoot to chair next session.  Will follow acutely.  Pt currently with functional limitations due to balance and endurance deficits. Pt will benefit from skilled PT to increase their independence and safety with mobility to allow discharge to the venue listed below.      Recommendations for follow up therapy are one component of a multi-disciplinary discharge planning process, led by the attending physician.  Recommendations may be updated based on patient status, additional functional criteria and insurance authorization.  Follow Up Recommendations  Skilled nursing-short term rehab (<3 hours/day) Can patient physically be transported by private vehicle: No   Assistance Recommended at Discharge Frequent or constant Supervision/Assistance  Patient can return home with the following A lot of help with walking and/or transfers;A lot of help with bathing/dressing/bathroom;Assistance with cooking/housework;Direct supervision/assist for medications management;Direct supervision/assist for financial management;Assist for transportation   Equipment Recommendations  Other (comment) (TBD)    Recommendations for Other Services       Precautions / Restrictions Precautions Precautions: Fall Required Braces  or Orthoses: Sling Restrictions Weight Bearing Restrictions: Yes RUE Weight Bearing: Non weight bearing RLE Weight Bearing: Weight bearing as tolerated     Mobility  Bed Mobility Overal bed mobility: Needs Assistance Bed Mobility: Supine to Sit, Sit to Supine     Supine to sit: Max assist, +2 for physical assistance, +2 for safety/equipment, HOB elevated Sit to supine: Max assist, +2 for physical assistance, +2 for safety/equipment   General bed mobility comments: able to advance LEs to EOB some with cues though delayed motor planning/initiation    Transfers Overall transfer level: Needs assistance Equipment used: 2 person hand held assist Transfers: Sit to/from Stand, Bed to chair/wheelchair/BSC Sit to Stand: Mod assist, +2 physical assistance     Squat pivot transfers: Max assist, +2 physical assistance, From elevated surface     General transfer comment: Attempted sit to stand twice with pt unable to fully achieve upright posture.  Max assist of 2 transfer to chair with use of gait belt and pad.  Will try lateral scoot to chair next visit.    Ambulation/Gait               General Gait Details: unable   Stairs             Wheelchair Mobility    Modified Rankin (Stroke Patients Only)       Balance Overall balance assessment: Needs assistance Sitting-balance support: Single extremity supported, No upper extremity supported, Feet supported Sitting balance-Leahy Scale: Fair Sitting balance - Comments: poor approaching fair   Standing balance support: Single extremity supported Standing balance-Leahy Scale: Zero Standing balance comment: reliant on external support,  Cognition Arousal/Alertness: Awake/alert Behavior During Therapy: Flat affect Overall Cognitive Status: History of cognitive impairments - at baseline                                 General Comments: pt with baseline  dementia/cognitive impairments. can follow simple directions consistently though memory deficits evident        Exercises General Exercises - Lower Extremity Long Arc Quad: AROM, Both, 10 reps, Seated    General Comments General comments (skin integrity, edema, etc.): VSS      Pertinent Vitals/Pain Pain Assessment Pain Assessment: Faces Faces Pain Scale: Hurts even more Pain Location: R shoulder and R hip Pain Descriptors / Indicators: Grimacing, Guarding Pain Intervention(s): Limited activity within patient's tolerance, Monitored during session, Repositioned, Premedicated before session, Ice applied    Home Living                          Prior Function            PT Goals (current goals can now be found in the care plan section) Acute Rehab PT Goals Patient Stated Goal: unable Progress towards PT goals: Not progressing toward goals - comment (confusion and slow progress)    Frequency    Min 3X/week      PT Plan Current plan remains appropriate    Co-evaluation              AM-PAC PT "6 Clicks" Mobility   Outcome Measure  Help needed turning from your back to your side while in a flat bed without using bedrails?: Total Help needed moving from lying on your back to sitting on the side of a flat bed without using bedrails?: Total Help needed moving to and from a bed to a chair (including a wheelchair)?: Total Help needed standing up from a chair using your arms (e.g., wheelchair or bedside chair)?: Total Help needed to walk in hospital room?: Total Help needed climbing 3-5 steps with a railing? : Total 6 Click Score: 6    End of Session Equipment Utilized During Treatment: Gait belt Activity Tolerance: Patient limited by fatigue;Patient limited by pain;Other (comment) (confusion) Patient left: with call bell/phone within reach;in chair;with chair alarm set Nurse Communication: Mobility status;Precautions;Weight bearing status;Need for lift  equipment PT Visit Diagnosis: Other abnormalities of gait and mobility (R26.89);Muscle weakness (generalized) (M62.81);History of falling (Z91.81);Pain Pain - Right/Left: Right Pain - part of body: Leg;Shoulder     Time: 7517-0017 PT Time Calculation (min) (ACUTE ONLY): 14 min  Charges:  $Therapeutic Activity: 8-22 mins                     [facility] M,PT Acute Rehab Services [phone]    Alvira Philips 05/01/2022, 2:04 PM

## 2022-05-01 NOTE — Discharge Instructions (Signed)
Advised to follow-up with primary care physician in 1 week. Advised to follow-up with Dr. Verlon Setting in 2 weeks. Advised to take Eliquis regularly for postoperative DVT prophylaxis. Advised to take Norco as needed for severe pain.

## 2022-05-01 NOTE — Progress Notes (Signed)
Occupational Therapy Treatment Patient Details Name: Laura Parks MRN: [id number] DOB: [birth year] Today's Date: 05/01/2022   History of present illness Pt is an 88 y/o F presenting to ED on 8/13 from [facility], fell out of bed onto R side, imaging revealing R intertrchanteric femur fx and R proximal humerus fx. S/p cephalomedullary nailing of R femur fx, nonoperative mgmt wtih sling an NWB of humerus fx. PMH includes anemia, cognitive impairments, legally blind, CAD, HTN, CVA, HLD, and DM2   OT comments  Pt with slower progress towards OT goals today d/t R hip pain with movement/WB attempts. Pt continues to require extensive +2 assist for bed mobility and standing attempts (unsuccessful in fully standing during session). Due to cognitive deficits and R dominant UE impairments, pt continues to require Max-Total A for bathing, dressing, and toileting. Pt also required Max A for sling mgmt- educated on precautions/sling wear though will need to further assess carryover d/t memory deficits. DC recs remain appropriate.   Recommendations for follow up therapy are one component of a multi-disciplinary discharge planning process, led by the attending physician.  Recommendations may be updated based on patient status, additional functional criteria and insurance authorization.    Follow Up Recommendations  Skilled nursing-short term rehab (<3 hours/day)    Assistance Recommended at Discharge Frequent or constant Supervision/Assistance  Patient can return home with the following  Two people to help with walking and/or transfers;A lot of help with bathing/dressing/bathroom;Assistance with cooking/housework;Assistance with feeding;Assist for transportation;Help with stairs or ramp for entrance;Direct supervision/assist for financial management;Direct supervision/assist for medications management   Equipment Recommendations  None recommended by OT    Recommendations for Other Services       Precautions / Restrictions Precautions Precautions: Fall Required Braces or Orthoses: Sling Restrictions Weight Bearing Restrictions: Yes RUE Weight Bearing: Non weight bearing RLE Weight Bearing: Weight bearing as tolerated       Mobility Bed Mobility Overal bed mobility: Needs Assistance Bed Mobility: Supine to Sit, Sit to Supine     Supine to sit: Max assist, +2 for physical assistance, +2 for safety/equipment, HOB elevated Sit to supine: Max assist, +2 for physical assistance, +2 for safety/equipment   General bed mobility comments: able to advance LEs to EOB some with cues though delayed motor planning/initiation    Transfers Overall transfer level: Needs assistance Equipment used: 2 person hand held assist Transfers: Sit to/from Stand             General transfer comment: Attempted sit to stand twice with pt unable to fully achieve upright posture     Balance Overall balance assessment: Needs assistance Sitting-balance support: Single extremity supported, No upper extremity supported, Feet supported Sitting balance-Leahy Scale: Fair     Standing balance support: Single extremity supported Standing balance-Leahy Scale: Zero                             ADL either performed or assessed with clinical judgement   ADL Overall ADL's : Needs assistance/impaired Eating/Feeding: Minimal assistance;Bed level               Upper Body Dressing : Maximal assistance   Lower Body Dressing: Total assistance;Bed level                 General ADL Comments: Limited by R hip pain with movement and WB attempts.    Extremity/Trunk Assessment Upper Extremity Assessment Upper Extremity Assessment: RUE deficits/detail RUE Deficits /  Details: able to flex/ext digits of R hand, bruising noted around upper UE. Sling improperly donned so OT fixed accordingly, educated pt on proper positioning though unsure of carryover RUE Coordination: decreased gross  motor;decreased fine motor   Lower Extremity Assessment Lower Extremity Assessment: Defer to PT evaluation        Vision   Vision Assessment?: No apparent visual deficits   Perception     Praxis      Cognition Arousal/Alertness: Awake/alert Behavior During Therapy: Flat affect Overall Cognitive Status: History of cognitive impairments - at baseline                                 General Comments: pt with baseline dementia/cognitive impairments. can follow simple directions consistently though memory deficits evident        Exercises      Shoulder Instructions       General Comments      Pertinent Vitals/ Pain       Pain Assessment Pain Assessment: Faces Faces Pain Scale: Hurts even more Pain Location: R shoulder and R hip Pain Descriptors / Indicators: Grimacing, Guarding Pain Intervention(s): Monitored during session, Other (comment), Limited activity within patient's tolerance (asked RN for pain premedication)  Home Living                                          Prior Functioning/Environment              Frequency  Min 2X/week        Progress Toward Goals  OT Goals(current goals can now be found in the care plan section)  Progress towards OT goals: OT to reassess next treatment  Acute Rehab OT Goals Patient Stated Goal: none stated but agreeable to attempt standing at bedside OT Goal Formulation: With patient Time For Goal Achievement: 05/13/22 Potential to Achieve Goals: Good ADL Goals Pt Will Perform Grooming: with set-up;standing;sitting Pt Will Perform Upper Body Dressing: with min assist;sitting;standing Additional ADL Goal #1: Pt will complete bed mobility mod A in prep for ADLs  Plan Discharge plan remains appropriate    Co-evaluation                 AM-PAC OT "6 Clicks" Daily Activity     Outcome Measure   Help from another person eating meals?: A Little Help from another person taking  care of personal grooming?: A Lot Help from another person toileting, which includes using toliet, bedpan, or urinal?: Total Help from another person bathing (including washing, rinsing, drying)?: A Lot Help from another person to put on and taking off regular upper body clothing?: A Lot Help from another person to put on and taking off regular lower body clothing?: Total 6 Click Score: 11    End of Session Equipment Utilized During Treatment: Gait belt  OT Visit Diagnosis: Unsteadiness on feet (R26.81);Other abnormalities of gait and mobility (R26.89);Muscle weakness (generalized) (M62.81);Other symptoms and signs involving cognitive function;Low vision, both eyes (H54.2)   Activity Tolerance Patient tolerated treatment well;Patient limited by pain   Patient Left in bed;with call bell/phone within reach;with bed alarm set   Nurse Communication Mobility status        Time: 5374-8270 OT Time Calculation (min): 23 min  Charges: OT General Charges $OT Visit: 1 Visit OT Treatments $Self Care/Home Management :  8-22 mins  Malachy Chamber, OTR/L Acute Rehab Services Office: [phone]   Layla Maw 05/01/2022, 11:41 AM

## 2022-11-13 ENCOUNTER — Encounter: Payer: Self-pay | Admitting: Radiology

## 2024-01-14 DEATH — deceased
